# Patient Record
Sex: Female | Born: 1935 | Race: White | Hispanic: No | Smoking: Former smoker
Health system: Southern US, Community
[De-identification: ages and names within clinical notes are randomized; demographics above are authoritative.]

## PROBLEM LIST (undated history)

## (undated) DIAGNOSIS — G459 Transient cerebral ischemic attack, unspecified: Secondary | ICD-10-CM

## (undated) DIAGNOSIS — J449 Chronic obstructive pulmonary disease, unspecified: Secondary | ICD-10-CM

## (undated) DIAGNOSIS — E039 Hypothyroidism, unspecified: Secondary | ICD-10-CM

## (undated) DIAGNOSIS — W19XXXA Unspecified fall, initial encounter: Secondary | ICD-10-CM

## (undated) DIAGNOSIS — K219 Gastro-esophageal reflux disease without esophagitis: Secondary | ICD-10-CM

## (undated) DIAGNOSIS — I10 Essential (primary) hypertension: Secondary | ICD-10-CM

## (undated) DIAGNOSIS — C801 Malignant (primary) neoplasm, unspecified: Secondary | ICD-10-CM

## (undated) DIAGNOSIS — F329 Major depressive disorder, single episode, unspecified: Secondary | ICD-10-CM

## (undated) DIAGNOSIS — I82409 Acute embolism and thrombosis of unspecified deep veins of unspecified lower extremity: Secondary | ICD-10-CM

## (undated) DIAGNOSIS — K432 Incisional hernia without obstruction or gangrene: Secondary | ICD-10-CM

## (undated) DIAGNOSIS — I639 Cerebral infarction, unspecified: Secondary | ICD-10-CM

## (undated) DIAGNOSIS — G4700 Insomnia, unspecified: Secondary | ICD-10-CM

## (undated) DIAGNOSIS — S22009A Unspecified fracture of unspecified thoracic vertebra, initial encounter for closed fracture: Secondary | ICD-10-CM

## (undated) DIAGNOSIS — M546 Pain in thoracic spine: Secondary | ICD-10-CM

## (undated) DIAGNOSIS — K5900 Constipation, unspecified: Secondary | ICD-10-CM

## (undated) DIAGNOSIS — K439 Ventral hernia without obstruction or gangrene: Secondary | ICD-10-CM

## (undated) DIAGNOSIS — R0602 Shortness of breath: Secondary | ICD-10-CM

## (undated) DIAGNOSIS — L920 Granuloma annulare: Secondary | ICD-10-CM

## (undated) DIAGNOSIS — M810 Age-related osteoporosis without current pathological fracture: Secondary | ICD-10-CM

## (undated) DIAGNOSIS — G8194 Hemiplegia, unspecified affecting left nondominant side: Secondary | ICD-10-CM

## (undated) HISTORY — PX: CATARACT EXTRACTION, BILATERAL: SHX1313

## (undated) HISTORY — DX: Unspecified fracture of unspecified thoracic vertebra, initial encounter for closed fracture: S22.009A

## (undated) HISTORY — DX: Insomnia, unspecified: G47.00

## (undated) HISTORY — DX: Unspecified fall, initial encounter: W19.XXXA

## (undated) HISTORY — PX: ABDOMINAL HYSTERECTOMY: SHX81

## (undated) HISTORY — PX: TONSILLECTOMY: SUR1361

## (undated) HISTORY — DX: Pain in thoracic spine: M54.6

## (undated) HISTORY — DX: Hemiplegia, unspecified affecting left nondominant side: G81.94

## (undated) HISTORY — DX: Incisional hernia without obstruction or gangrene: K43.2

## (undated) HISTORY — DX: Constipation, unspecified: K59.00

## (undated) HISTORY — PX: EXPLORATORY LAPAROTOMY W/ BOWEL RESECTION: SHX1544

## (undated) HISTORY — DX: Cerebral infarction, unspecified: I63.9

## (undated) HISTORY — DX: Essential (primary) hypertension: I10

## (undated) HISTORY — DX: Gastro-esophageal reflux disease without esophagitis: K21.9

## (undated) HISTORY — DX: Ventral hernia without obstruction or gangrene: K43.9

## (undated) HISTORY — DX: Granuloma annulare: L92.0

## (undated) HISTORY — DX: Major depressive disorder, single episode, unspecified: F32.9

## (undated) HISTORY — DX: Acute embolism and thrombosis of unspecified deep veins of unspecified lower extremity: I82.409

---

## 1978-03-23 DIAGNOSIS — C801 Malignant (primary) neoplasm, unspecified: Secondary | ICD-10-CM

## 1978-03-23 HISTORY — PX: MASTECTOMY: SHX3

## 1978-03-23 HISTORY — DX: Malignant (primary) neoplasm, unspecified: C80.1

## 1997-11-20 ENCOUNTER — Other Ambulatory Visit: Admission: RE | Admit: 1997-11-20 | Discharge: 1997-11-20 | Payer: Self-pay | Admitting: Internal Medicine

## 1999-01-21 ENCOUNTER — Ambulatory Visit (HOSPITAL_COMMUNITY): Admission: RE | Admit: 1999-01-21 | Discharge: 1999-01-21 | Payer: Self-pay | Admitting: *Deleted

## 2002-01-11 ENCOUNTER — Ambulatory Visit (HOSPITAL_COMMUNITY): Admission: RE | Admit: 2002-01-11 | Discharge: 2002-01-11 | Payer: Self-pay | Admitting: *Deleted

## 2005-04-30 ENCOUNTER — Encounter: Admission: RE | Admit: 2005-04-30 | Discharge: 2005-04-30 | Payer: Self-pay | Admitting: Internal Medicine

## 2006-10-17 ENCOUNTER — Emergency Department (HOSPITAL_COMMUNITY): Admission: EM | Admit: 2006-10-17 | Discharge: 2006-10-18 | Payer: Self-pay | Admitting: Emergency Medicine

## 2008-03-23 HISTORY — PX: TOTAL KNEE ARTHROPLASTY: SHX125

## 2008-04-03 ENCOUNTER — Ambulatory Visit (HOSPITAL_COMMUNITY): Admission: RE | Admit: 2008-04-03 | Discharge: 2008-04-03 | Payer: Self-pay | Admitting: Internal Medicine

## 2008-12-17 ENCOUNTER — Inpatient Hospital Stay (HOSPITAL_COMMUNITY): Admission: RE | Admit: 2008-12-17 | Discharge: 2008-12-28 | Payer: Self-pay | Admitting: Orthopedic Surgery

## 2008-12-29 ENCOUNTER — Inpatient Hospital Stay (HOSPITAL_COMMUNITY): Admission: EM | Admit: 2008-12-29 | Discharge: 2009-01-02 | Payer: Self-pay | Admitting: Emergency Medicine

## 2009-10-15 ENCOUNTER — Encounter: Admission: RE | Admit: 2009-10-15 | Discharge: 2009-10-15 | Payer: Self-pay | Admitting: Internal Medicine

## 2010-01-07 ENCOUNTER — Ambulatory Visit (HOSPITAL_COMMUNITY)
Admission: RE | Admit: 2010-01-07 | Discharge: 2010-01-07 | Payer: Self-pay | Source: Home / Self Care | Admitting: Internal Medicine

## 2010-06-26 LAB — BASIC METABOLIC PANEL
BUN: 5 mg/dL — ABNORMAL LOW (ref 6–23)
BUN: 5 mg/dL — ABNORMAL LOW (ref 6–23)
BUN: 5 mg/dL — ABNORMAL LOW (ref 6–23)
BUN: 6 mg/dL (ref 6–23)
BUN: 7 mg/dL (ref 6–23)
BUN: 9 mg/dL (ref 6–23)
CO2: 25 mEq/L (ref 19–32)
CO2: 25 mEq/L (ref 19–32)
CO2: 27 mEq/L (ref 19–32)
CO2: 27 mEq/L (ref 19–32)
CO2: 29 mEq/L (ref 19–32)
CO2: 29 mEq/L (ref 19–32)
Calcium: 8 mg/dL — ABNORMAL LOW (ref 8.4–10.5)
Calcium: 8.3 mg/dL — ABNORMAL LOW (ref 8.4–10.5)
Calcium: 8.6 mg/dL (ref 8.4–10.5)
Chloride: 100 mEq/L (ref 96–112)
Chloride: 101 mEq/L (ref 96–112)
Chloride: 101 mEq/L (ref 96–112)
Chloride: 102 mEq/L (ref 96–112)
Chloride: 104 mEq/L (ref 96–112)
Chloride: 98 mEq/L (ref 96–112)
Chloride: 99 mEq/L (ref 96–112)
Chloride: 99 mEq/L (ref 96–112)
Creatinine, Ser: 0.47 mg/dL (ref 0.4–1.2)
Creatinine, Ser: 0.52 mg/dL (ref 0.4–1.2)
Creatinine, Ser: 0.63 mg/dL (ref 0.4–1.2)
GFR calc Af Amer: >60 mL/min (ref >60)
GFR calc Af Amer: >60 mL/min (ref >60)
GFR calc Af Amer: >60 mL/min (ref >60)
GFR calc Af Amer: >60 mL/min (ref >60)
GFR calc Af Amer: >60 mL/min (ref >60)
GFR calc non Af Amer: >60 mL/min (ref >60)
GFR calc non Af Amer: >60 mL/min (ref >60)
GFR calc non Af Amer: >60 mL/min (ref >60)
GFR calc non Af Amer: >60 mL/min (ref >60)
GFR calc non Af Amer: >60 mL/min (ref >60)
GFR calc non Af Amer: >60 mL/min (ref >60)
GFR calc non Af Amer: >60 mL/min (ref >60)
Glucose, Bld: 103 mg/dL — ABNORMAL HIGH (ref 70–99)
Glucose, Bld: 106 mg/dL — ABNORMAL HIGH (ref 70–99)
Glucose, Bld: 126 mg/dL — ABNORMAL HIGH (ref 70–99)
Glucose, Bld: 129 mg/dL — ABNORMAL HIGH (ref 70–99)
Glucose, Bld: 140 mg/dL — ABNORMAL HIGH (ref 70–99)
Glucose, Bld: 140 mg/dL — ABNORMAL HIGH (ref 70–99)
Glucose, Bld: 158 mg/dL — ABNORMAL HIGH (ref 70–99)
Glucose, Bld: 92 mg/dL (ref 70–99)
Potassium: 2.8 mEq/L — ABNORMAL LOW (ref 3.5–5.1)
Potassium: 3.1 mEq/L — ABNORMAL LOW (ref 3.5–5.1)
Potassium: 3.7 mEq/L (ref 3.5–5.1)
Potassium: 3.7 mEq/L (ref 3.5–5.1)
Potassium: 3.8 mEq/L (ref 3.5–5.1)
Potassium: 4 mEq/L (ref 3.5–5.1)
Sodium: 130 mEq/L — ABNORMAL LOW (ref 135–145)
Sodium: 132 mEq/L — ABNORMAL LOW (ref 135–145)
Sodium: 133 mEq/L — ABNORMAL LOW (ref 135–145)
Sodium: 133 mEq/L — ABNORMAL LOW (ref 135–145)
Sodium: 134 mEq/L — ABNORMAL LOW (ref 135–145)
Sodium: 136 mEq/L (ref 135–145)
Sodium: 136 mEq/L (ref 135–145)
Sodium: 139 mEq/L (ref 135–145)

## 2010-06-26 LAB — PROTIME-INR
INR: 2.04 — ABNORMAL HIGH (ref 0.00–1.49)
INR: 2.17 — ABNORMAL HIGH (ref 0.00–1.49)
INR: 2.5 — ABNORMAL HIGH (ref 0.00–1.49)
INR: 2.9 — ABNORMAL HIGH (ref 0.00–1.49)
Prothrombin Time: 22.3 seconds — ABNORMAL HIGH (ref 11.6–15.2)
Prothrombin Time: 24 seconds — ABNORMAL HIGH (ref 11.6–15.2)
Prothrombin Time: 24.4 seconds — ABNORMAL HIGH (ref 11.6–15.2)
Prothrombin Time: 27.3 seconds — ABNORMAL HIGH (ref 11.6–15.2)
Prothrombin Time: 29.8 seconds — ABNORMAL HIGH (ref 11.6–15.2)

## 2010-06-26 LAB — CBC
HCT: 30.1 % — ABNORMAL LOW (ref 36.0–46.0)
HCT: 30.8 % — ABNORMAL LOW (ref 36.0–46.0)
HCT: 30.9 % — ABNORMAL LOW (ref 36.0–46.0)
HCT: 34.3 % — ABNORMAL LOW (ref 36.0–46.0)
Hemoglobin: 10.2 g/dL — ABNORMAL LOW (ref 12.0–15.0)
Hemoglobin: 10.4 g/dL — ABNORMAL LOW (ref 12.0–15.0)
Hemoglobin: 10.4 g/dL — ABNORMAL LOW (ref 12.0–15.0)
Hemoglobin: 10.8 g/dL — ABNORMAL LOW (ref 12.0–15.0)
Hemoglobin: 11.4 g/dL — ABNORMAL LOW (ref 12.0–15.0)
MCHC: 32.6 g/dL (ref 30.0–36.0)
MCHC: 33.4 g/dL (ref 30.0–36.0)
MCHC: 33.7 g/dL (ref 30.0–36.0)
MCHC: 34.7 g/dL (ref 30.0–36.0)
MCV: 92.5 fL (ref 78.0–100.0)
MCV: 92.7 fL (ref 78.0–100.0)
MCV: 93.3 fL (ref 78.0–100.0)
MCV: 93.4 fL (ref 78.0–100.0)
MCV: 93.4 fL (ref 78.0–100.0)
Platelets: 409 10*3/uL — ABNORMAL HIGH (ref 150–400)
Platelets: 606 10*3/uL — ABNORMAL HIGH (ref 150–400)
RBC: 3.23 MIL/uL — ABNORMAL LOW (ref 3.87–5.11)
RBC: 3.28 MIL/uL — ABNORMAL LOW (ref 3.87–5.11)
RBC: 3.3 MIL/uL — ABNORMAL LOW (ref 3.87–5.11)
RBC: 3.67 MIL/uL — ABNORMAL LOW (ref 3.87–5.11)
RDW: 14.6 % (ref 11.5–15.5)
RDW: 14.8 % (ref 11.5–15.5)
RDW: 15 % (ref 11.5–15.5)
RDW: 15.5 % (ref 11.5–15.5)
WBC: 5.6 10*3/uL (ref 4.0–10.5)
WBC: 7.5 10*3/uL (ref 4.0–10.5)
WBC: 7.8 10*3/uL (ref 4.0–10.5)

## 2010-06-26 LAB — POCT I-STAT, CHEM 8
BUN: 10 mg/dL (ref 6–23)
Calcium, Ion: 1.12 mmol/L (ref 1.12–1.32)
Chloride: 97 mEq/L (ref 96–112)
Creatinine, Ser: 0.6 mg/dL (ref 0.4–1.2)
Sodium: 134 mEq/L — ABNORMAL LOW (ref 135–145)
TCO2: 26 mmol/L (ref 0–100)

## 2010-06-26 LAB — CARDIAC PANEL(CRET KIN+CKTOT+MB+TROPI)
CK, MB: 2.7 ng/mL (ref 0.3–4.0)
Total CK: 44 U/L (ref 7–177)
Troponin I: 0.03 ng/mL (ref 0.00–0.06)
Troponin I: 0.03 ng/mL (ref 0.00–0.06)

## 2010-06-26 LAB — MYOGLOBIN, SERUM: Myoglobin: 83 ng/mL (ref <111)

## 2010-06-27 LAB — URINALYSIS, ROUTINE W REFLEX MICROSCOPIC
Bilirubin Urine: NEGATIVE
Glucose, UA: NEGATIVE mg/dL
Hgb urine dipstick: NEGATIVE
Ketones, ur: NEGATIVE mg/dL
Nitrite: NEGATIVE
Protein, ur: NEGATIVE mg/dL
Specific Gravity, Urine: 1.02 (ref 1.005–1.030)
Urobilinogen, UA: 0.2 mg/dL (ref 0.0–1.0)
pH: 7 (ref 5.0–8.0)

## 2010-06-27 LAB — CBC
HCT: 30.6 % — ABNORMAL LOW (ref 36.0–46.0)
HCT: 32.2 % — ABNORMAL LOW (ref 36.0–46.0)
HCT: 37.6 % (ref 36.0–46.0)
Hemoglobin: 10.5 g/dL — ABNORMAL LOW (ref 12.0–15.0)
Hemoglobin: 11.1 g/dL — ABNORMAL LOW (ref 12.0–15.0)
Hemoglobin: 12.9 g/dL (ref 12.0–15.0)
MCHC: 34.2 g/dL (ref 30.0–36.0)
MCHC: 34.3 g/dL (ref 30.0–36.0)
MCHC: 34.3 g/dL (ref 30.0–36.0)
MCHC: 34.5 g/dL (ref 30.0–36.0)
MCV: 93.4 fL (ref 78.0–100.0)
MCV: 94.3 fL (ref 78.0–100.0)
MCV: 94.4 fL (ref 78.0–100.0)
MCV: 94.5 fL (ref 78.0–100.0)
Platelets: 350 10*3/uL (ref 150–400)
Platelets: 367 10*3/uL (ref 150–400)
Platelets: 372 10*3/uL (ref 150–400)
Platelets: 417 10*3/uL — ABNORMAL HIGH (ref 150–400)
RBC: 3.24 MIL/uL — ABNORMAL LOW (ref 3.87–5.11)
RBC: 3.42 MIL/uL — ABNORMAL LOW (ref 3.87–5.11)
RBC: 3.48 MIL/uL — ABNORMAL LOW (ref 3.87–5.11)
RBC: 4.03 MIL/uL (ref 3.87–5.11)
RDW: 14.1 % (ref 11.5–15.5)
RDW: 14.6 % (ref 11.5–15.5)
RDW: 14.8 % (ref 11.5–15.5)
RDW: 15 % (ref 11.5–15.5)
WBC: 6 10*3/uL (ref 4.0–10.5)
WBC: 8.3 10*3/uL (ref 4.0–10.5)
WBC: 8.6 10*3/uL (ref 4.0–10.5)

## 2010-06-27 LAB — BASIC METABOLIC PANEL WITH GFR
BUN: 10 mg/dL (ref 6–23)
BUN: 13 mg/dL (ref 6–23)
BUN: 5 mg/dL — ABNORMAL LOW (ref 6–23)
CO2: 27 meq/L (ref 19–32)
CO2: 28 meq/L (ref 19–32)
CO2: 28 meq/L (ref 19–32)
Calcium: 8.1 mg/dL — ABNORMAL LOW (ref 8.4–10.5)
Calcium: 8.3 mg/dL — ABNORMAL LOW (ref 8.4–10.5)
Calcium: 8.5 mg/dL (ref 8.4–10.5)
Chloride: 100 meq/L (ref 96–112)
Chloride: 103 meq/L (ref 96–112)
Chloride: 99 meq/L (ref 96–112)
Creatinine, Ser: 0.58 mg/dL (ref 0.4–1.2)
Creatinine, Ser: 0.62 mg/dL (ref 0.4–1.2)
Creatinine, Ser: 0.73 mg/dL (ref 0.4–1.2)
GFR calc non Af Amer: >60 mL/min
GFR calc non Af Amer: >60 mL/min
GFR calc non Af Amer: >60 mL/min
Glucose, Bld: 146 mg/dL — ABNORMAL HIGH (ref 70–99)
Glucose, Bld: 150 mg/dL — ABNORMAL HIGH (ref 70–99)
Glucose, Bld: 153 mg/dL — ABNORMAL HIGH (ref 70–99)
Potassium: 3.2 meq/L — ABNORMAL LOW (ref 3.5–5.1)
Potassium: 3.7 meq/L (ref 3.5–5.1)
Potassium: 3.7 meq/L (ref 3.5–5.1)
Sodium: 132 meq/L — ABNORMAL LOW (ref 135–145)
Sodium: 133 meq/L — ABNORMAL LOW (ref 135–145)
Sodium: 136 meq/L (ref 135–145)

## 2010-06-27 LAB — TYPE AND SCREEN
ABO/RH(D): O POS
Antibody Screen: NEGATIVE

## 2010-06-27 LAB — COMPREHENSIVE METABOLIC PANEL WITH GFR
ALT: 14 U/L (ref 0–35)
AST: 17 U/L (ref 0–37)
Albumin: 3.7 g/dL (ref 3.5–5.2)
Alkaline Phosphatase: 49 U/L (ref 39–117)
BUN: 13 mg/dL (ref 6–23)
CO2: 29 meq/L (ref 19–32)
Calcium: 8.6 mg/dL (ref 8.4–10.5)
Chloride: 105 meq/L (ref 96–112)
Creatinine, Ser: 0.69 mg/dL (ref 0.4–1.2)
GFR calc non Af Amer: >60 mL/min
Glucose, Bld: 93 mg/dL (ref 70–99)
Potassium: 4.8 meq/L (ref 3.5–5.1)
Sodium: 138 meq/L (ref 135–145)
Total Bilirubin: 0.5 mg/dL (ref 0.3–1.2)
Total Protein: 6.2 g/dL (ref 6.0–8.3)

## 2010-06-27 LAB — MAGNESIUM: Magnesium: 2 mg/dL (ref 1.5–2.5)

## 2010-06-27 LAB — PROTIME-INR
INR: 1 (ref 0.00–1.49)
INR: 2.3 — ABNORMAL HIGH (ref 0.00–1.49)
INR: 4.1 — ABNORMAL HIGH (ref 0.00–1.49)
Prothrombin Time: 12.8 s (ref 11.6–15.2)
Prothrombin Time: 14.1 seconds (ref 11.6–15.2)
Prothrombin Time: 25.4 seconds — ABNORMAL HIGH (ref 11.6–15.2)
Prothrombin Time: 39.7 s — ABNORMAL HIGH (ref 11.6–15.2)

## 2010-06-27 LAB — PHOSPHORUS: Phosphorus: 1.8 mg/dL — ABNORMAL LOW (ref 2.3–4.6)

## 2010-06-27 LAB — APTT

## 2011-04-09 DIAGNOSIS — E039 Hypothyroidism, unspecified: Secondary | ICD-10-CM | POA: Diagnosis not present

## 2011-04-09 DIAGNOSIS — I10 Essential (primary) hypertension: Secondary | ICD-10-CM | POA: Diagnosis not present

## 2011-04-09 DIAGNOSIS — E785 Hyperlipidemia, unspecified: Secondary | ICD-10-CM | POA: Diagnosis not present

## 2011-04-09 DIAGNOSIS — E559 Vitamin D deficiency, unspecified: Secondary | ICD-10-CM | POA: Diagnosis not present

## 2011-04-16 DIAGNOSIS — Z0000 Encounter for general adult medical examination without abnormal findings: Secondary | ICD-10-CM | POA: Diagnosis not present

## 2011-04-16 DIAGNOSIS — I10 Essential (primary) hypertension: Secondary | ICD-10-CM | POA: Diagnosis not present

## 2011-04-16 DIAGNOSIS — E669 Obesity, unspecified: Secondary | ICD-10-CM | POA: Diagnosis not present

## 2011-04-16 DIAGNOSIS — K219 Gastro-esophageal reflux disease without esophagitis: Secondary | ICD-10-CM | POA: Diagnosis not present

## 2011-04-17 DIAGNOSIS — Z1212 Encounter for screening for malignant neoplasm of rectum: Secondary | ICD-10-CM | POA: Diagnosis not present

## 2011-06-08 DIAGNOSIS — I639 Cerebral infarction, unspecified: Secondary | ICD-10-CM

## 2011-06-08 HISTORY — DX: Cerebral infarction, unspecified: I63.9

## 2011-09-14 DIAGNOSIS — L259 Unspecified contact dermatitis, unspecified cause: Secondary | ICD-10-CM | POA: Diagnosis not present

## 2011-09-14 DIAGNOSIS — B029 Zoster without complications: Secondary | ICD-10-CM | POA: Diagnosis not present

## 2011-09-14 DIAGNOSIS — I10 Essential (primary) hypertension: Secondary | ICD-10-CM | POA: Diagnosis not present

## 2011-10-27 DIAGNOSIS — I10 Essential (primary) hypertension: Secondary | ICD-10-CM | POA: Diagnosis not present

## 2011-10-27 DIAGNOSIS — R7309 Other abnormal glucose: Secondary | ICD-10-CM | POA: Diagnosis not present

## 2011-10-27 DIAGNOSIS — E039 Hypothyroidism, unspecified: Secondary | ICD-10-CM | POA: Diagnosis not present

## 2011-10-27 DIAGNOSIS — R209 Unspecified disturbances of skin sensation: Secondary | ICD-10-CM | POA: Diagnosis not present

## 2011-11-10 DIAGNOSIS — M810 Age-related osteoporosis without current pathological fracture: Secondary | ICD-10-CM | POA: Diagnosis not present

## 2011-12-09 DIAGNOSIS — H2640 Unspecified secondary cataract: Secondary | ICD-10-CM | POA: Diagnosis not present

## 2011-12-09 DIAGNOSIS — H1030 Unspecified acute conjunctivitis, unspecified eye: Secondary | ICD-10-CM | POA: Diagnosis not present

## 2011-12-09 DIAGNOSIS — Z961 Presence of intraocular lens: Secondary | ICD-10-CM | POA: Diagnosis not present

## 2012-01-06 DIAGNOSIS — Z79899 Other long term (current) drug therapy: Secondary | ICD-10-CM | POA: Diagnosis not present

## 2012-01-06 DIAGNOSIS — M810 Age-related osteoporosis without current pathological fracture: Secondary | ICD-10-CM | POA: Diagnosis not present

## 2012-01-06 DIAGNOSIS — E559 Vitamin D deficiency, unspecified: Secondary | ICD-10-CM | POA: Diagnosis not present

## 2012-01-06 DIAGNOSIS — K219 Gastro-esophageal reflux disease without esophagitis: Secondary | ICD-10-CM | POA: Diagnosis not present

## 2012-01-15 DIAGNOSIS — Z23 Encounter for immunization: Secondary | ICD-10-CM | POA: Diagnosis not present

## 2012-01-25 ENCOUNTER — Other Ambulatory Visit (HOSPITAL_COMMUNITY): Payer: Self-pay | Admitting: *Deleted

## 2012-01-27 ENCOUNTER — Encounter (HOSPITAL_COMMUNITY): Payer: Self-pay

## 2012-01-27 ENCOUNTER — Ambulatory Visit (HOSPITAL_COMMUNITY)
Admission: RE | Admit: 2012-01-27 | Discharge: 2012-01-27 | Disposition: A | Discharge status: Home / Self Care | Payer: Medicare Other | Source: Ambulatory Visit | Attending: Internal Medicine | Admitting: Internal Medicine

## 2012-01-27 DIAGNOSIS — M810 Age-related osteoporosis without current pathological fracture: Secondary | ICD-10-CM | POA: Insufficient documentation

## 2012-01-27 HISTORY — DX: Chronic obstructive pulmonary disease, unspecified: J44.9

## 2012-01-27 HISTORY — DX: Essential (primary) hypertension: I10

## 2012-01-27 HISTORY — DX: Shortness of breath: R06.02

## 2012-01-27 HISTORY — DX: Hypothyroidism, unspecified: E03.9

## 2012-01-27 HISTORY — DX: Age-related osteoporosis without current pathological fracture: M81.0

## 2012-01-27 MED ORDER — ZOLEDRONIC ACID 5 MG/100ML IV SOLN
5.0000 mg | Freq: Once | INTRAVENOUS | Status: AC
  Administered 2012-01-27: 5 mg via INTRAVENOUS
  Filled 2012-01-27: qty 100

## 2012-01-27 MED ORDER — SODIUM CHLORIDE 0.9 % IV SOLN
Freq: Once | INTRAVENOUS | Status: AC
  Administered 2012-01-27: 11:00:00 via INTRAVENOUS

## 2012-02-22 DIAGNOSIS — G43909 Migraine, unspecified, not intractable, without status migrainosus: Secondary | ICD-10-CM | POA: Diagnosis not present

## 2012-03-01 DIAGNOSIS — G43909 Migraine, unspecified, not intractable, without status migrainosus: Secondary | ICD-10-CM | POA: Diagnosis not present

## 2012-03-31 DIAGNOSIS — H5310 Unspecified subjective visual disturbances: Secondary | ICD-10-CM | POA: Diagnosis not present

## 2012-03-31 DIAGNOSIS — H2640 Unspecified secondary cataract: Secondary | ICD-10-CM | POA: Diagnosis not present

## 2012-03-31 DIAGNOSIS — H04129 Dry eye syndrome of unspecified lacrimal gland: Secondary | ICD-10-CM | POA: Diagnosis not present

## 2012-03-31 DIAGNOSIS — H43819 Vitreous degeneration, unspecified eye: Secondary | ICD-10-CM | POA: Diagnosis not present

## 2012-04-26 DIAGNOSIS — I10 Essential (primary) hypertension: Secondary | ICD-10-CM | POA: Diagnosis not present

## 2012-04-26 DIAGNOSIS — E559 Vitamin D deficiency, unspecified: Secondary | ICD-10-CM | POA: Diagnosis not present

## 2012-04-26 DIAGNOSIS — E785 Hyperlipidemia, unspecified: Secondary | ICD-10-CM | POA: Diagnosis not present

## 2012-04-26 DIAGNOSIS — E039 Hypothyroidism, unspecified: Secondary | ICD-10-CM | POA: Diagnosis not present

## 2012-04-28 DIAGNOSIS — H26499 Other secondary cataract, unspecified eye: Secondary | ICD-10-CM | POA: Diagnosis not present

## 2012-04-28 DIAGNOSIS — H2640 Unspecified secondary cataract: Secondary | ICD-10-CM | POA: Diagnosis not present

## 2012-05-02 DIAGNOSIS — Z1331 Encounter for screening for depression: Secondary | ICD-10-CM | POA: Diagnosis not present

## 2012-05-02 DIAGNOSIS — K219 Gastro-esophageal reflux disease without esophagitis: Secondary | ICD-10-CM | POA: Diagnosis not present

## 2012-05-02 DIAGNOSIS — Z1212 Encounter for screening for malignant neoplasm of rectum: Secondary | ICD-10-CM | POA: Diagnosis not present

## 2012-05-02 DIAGNOSIS — Z0000 Encounter for general adult medical examination without abnormal findings: Secondary | ICD-10-CM | POA: Diagnosis not present

## 2012-05-02 DIAGNOSIS — H6120 Impacted cerumen, unspecified ear: Secondary | ICD-10-CM | POA: Diagnosis not present

## 2012-05-12 DIAGNOSIS — H2640 Unspecified secondary cataract: Secondary | ICD-10-CM | POA: Diagnosis not present

## 2012-05-12 DIAGNOSIS — H26499 Other secondary cataract, unspecified eye: Secondary | ICD-10-CM | POA: Diagnosis not present

## 2012-10-16 DIAGNOSIS — J189 Pneumonia, unspecified organism: Secondary | ICD-10-CM | POA: Diagnosis not present

## 2012-10-19 DIAGNOSIS — E871 Hypo-osmolality and hyponatremia: Secondary | ICD-10-CM | POA: Diagnosis not present

## 2012-10-19 DIAGNOSIS — E46 Unspecified protein-calorie malnutrition: Secondary | ICD-10-CM | POA: Diagnosis not present

## 2012-10-19 DIAGNOSIS — J449 Chronic obstructive pulmonary disease, unspecified: Secondary | ICD-10-CM | POA: Diagnosis not present

## 2012-10-19 DIAGNOSIS — E869 Volume depletion, unspecified: Secondary | ICD-10-CM | POA: Diagnosis present

## 2012-10-19 DIAGNOSIS — Z87891 Personal history of nicotine dependence: Secondary | ICD-10-CM | POA: Diagnosis not present

## 2012-10-19 DIAGNOSIS — Z9889 Other specified postprocedural states: Secondary | ICD-10-CM | POA: Diagnosis not present

## 2012-10-19 DIAGNOSIS — E669 Obesity, unspecified: Secondary | ICD-10-CM | POA: Diagnosis present

## 2012-10-19 DIAGNOSIS — Z9010 Acquired absence of unspecified breast and nipple: Secondary | ICD-10-CM | POA: Diagnosis not present

## 2012-10-19 DIAGNOSIS — Z66 Do not resuscitate: Secondary | ICD-10-CM | POA: Diagnosis present

## 2012-10-19 DIAGNOSIS — I82619 Acute embolism and thrombosis of superficial veins of unspecified upper extremity: Secondary | ICD-10-CM | POA: Diagnosis present

## 2012-10-19 DIAGNOSIS — E870 Hyperosmolality and hypernatremia: Secondary | ICD-10-CM | POA: Diagnosis not present

## 2012-10-19 DIAGNOSIS — K5989 Other specified functional intestinal disorders: Secondary | ICD-10-CM | POA: Diagnosis not present

## 2012-10-19 DIAGNOSIS — J441 Chronic obstructive pulmonary disease with (acute) exacerbation: Secondary | ICD-10-CM | POA: Diagnosis not present

## 2012-10-19 DIAGNOSIS — R0602 Shortness of breath: Secondary | ICD-10-CM | POA: Diagnosis not present

## 2012-10-19 DIAGNOSIS — Z452 Encounter for adjustment and management of vascular access device: Secondary | ICD-10-CM | POA: Diagnosis not present

## 2012-10-19 DIAGNOSIS — K929 Disease of digestive system, unspecified: Secondary | ICD-10-CM | POA: Diagnosis not present

## 2012-10-19 DIAGNOSIS — K6389 Other specified diseases of intestine: Secondary | ICD-10-CM | POA: Diagnosis not present

## 2012-10-19 DIAGNOSIS — R142 Eructation: Secondary | ICD-10-CM | POA: Diagnosis not present

## 2012-10-19 DIAGNOSIS — J4489 Other specified chronic obstructive pulmonary disease: Secondary | ICD-10-CM | POA: Diagnosis not present

## 2012-10-19 DIAGNOSIS — R279 Unspecified lack of coordination: Secondary | ICD-10-CM | POA: Diagnosis not present

## 2012-10-19 DIAGNOSIS — D7289 Other specified disorders of white blood cells: Secondary | ICD-10-CM | POA: Diagnosis not present

## 2012-10-19 DIAGNOSIS — D72829 Elevated white blood cell count, unspecified: Secondary | ICD-10-CM | POA: Diagnosis not present

## 2012-10-19 DIAGNOSIS — K565 Intestinal adhesions [bands], unspecified as to partial versus complete obstruction: Secondary | ICD-10-CM | POA: Diagnosis not present

## 2012-10-19 DIAGNOSIS — R05 Cough: Secondary | ICD-10-CM | POA: Diagnosis not present

## 2012-10-19 DIAGNOSIS — Z5189 Encounter for other specified aftercare: Secondary | ICD-10-CM | POA: Diagnosis not present

## 2012-10-19 DIAGNOSIS — R269 Unspecified abnormalities of gait and mobility: Secondary | ICD-10-CM | POA: Diagnosis not present

## 2012-10-19 DIAGNOSIS — K560 Paralytic ileus: Secondary | ICD-10-CM | POA: Diagnosis not present

## 2012-10-19 DIAGNOSIS — J189 Pneumonia, unspecified organism: Secondary | ICD-10-CM | POA: Diagnosis not present

## 2012-10-19 DIAGNOSIS — E878 Other disorders of electrolyte and fluid balance, not elsewhere classified: Secondary | ICD-10-CM | POA: Diagnosis present

## 2012-10-19 DIAGNOSIS — J13 Pneumonia due to Streptococcus pneumoniae: Secondary | ICD-10-CM | POA: Diagnosis not present

## 2012-10-19 DIAGNOSIS — Z853 Personal history of malignant neoplasm of breast: Secondary | ICD-10-CM | POA: Diagnosis not present

## 2012-10-19 DIAGNOSIS — R1110 Vomiting, unspecified: Secondary | ICD-10-CM | POA: Diagnosis not present

## 2012-10-19 DIAGNOSIS — N179 Acute kidney failure, unspecified: Secondary | ICD-10-CM | POA: Diagnosis not present

## 2012-10-19 DIAGNOSIS — K389 Disease of appendix, unspecified: Secondary | ICD-10-CM | POA: Diagnosis not present

## 2012-10-19 DIAGNOSIS — D649 Anemia, unspecified: Secondary | ICD-10-CM | POA: Diagnosis not present

## 2012-10-19 DIAGNOSIS — R7309 Other abnormal glucose: Secondary | ICD-10-CM | POA: Diagnosis not present

## 2012-10-19 DIAGNOSIS — I6990 Unspecified sequelae of unspecified cerebrovascular disease: Secondary | ICD-10-CM | POA: Diagnosis not present

## 2012-10-19 DIAGNOSIS — Z8669 Personal history of other diseases of the nervous system and sense organs: Secondary | ICD-10-CM | POA: Diagnosis not present

## 2012-10-19 DIAGNOSIS — Z96659 Presence of unspecified artificial knee joint: Secondary | ICD-10-CM | POA: Diagnosis not present

## 2012-10-19 DIAGNOSIS — M6281 Muscle weakness (generalized): Secondary | ICD-10-CM | POA: Diagnosis not present

## 2012-10-19 DIAGNOSIS — E039 Hypothyroidism, unspecified: Secondary | ICD-10-CM | POA: Diagnosis present

## 2012-10-19 DIAGNOSIS — N736 Female pelvic peritoneal adhesions (postinfective): Secondary | ICD-10-CM | POA: Diagnosis not present

## 2012-10-19 DIAGNOSIS — K5900 Constipation, unspecified: Secondary | ICD-10-CM | POA: Diagnosis present

## 2012-10-19 DIAGNOSIS — K56609 Unspecified intestinal obstruction, unspecified as to partial versus complete obstruction: Secondary | ICD-10-CM | POA: Diagnosis not present

## 2012-10-19 DIAGNOSIS — K660 Peritoneal adhesions (postprocedural) (postinfection): Secondary | ICD-10-CM | POA: Diagnosis not present

## 2012-10-20 DIAGNOSIS — K56609 Unspecified intestinal obstruction, unspecified as to partial versus complete obstruction: Secondary | ICD-10-CM | POA: Diagnosis not present

## 2012-10-21 DIAGNOSIS — K56609 Unspecified intestinal obstruction, unspecified as to partial versus complete obstruction: Secondary | ICD-10-CM | POA: Diagnosis not present

## 2012-10-22 DIAGNOSIS — K56609 Unspecified intestinal obstruction, unspecified as to partial versus complete obstruction: Secondary | ICD-10-CM | POA: Diagnosis not present

## 2012-10-25 DIAGNOSIS — I639 Cerebral infarction, unspecified: Secondary | ICD-10-CM | POA: Insufficient documentation

## 2012-10-27 DIAGNOSIS — K56609 Unspecified intestinal obstruction, unspecified as to partial versus complete obstruction: Secondary | ICD-10-CM | POA: Diagnosis not present

## 2012-10-28 DIAGNOSIS — Z452 Encounter for adjustment and management of vascular access device: Secondary | ICD-10-CM | POA: Diagnosis not present

## 2012-10-29 DIAGNOSIS — K6389 Other specified diseases of intestine: Secondary | ICD-10-CM | POA: Diagnosis not present

## 2012-10-31 DIAGNOSIS — I6990 Unspecified sequelae of unspecified cerebrovascular disease: Secondary | ICD-10-CM | POA: Diagnosis not present

## 2012-10-31 DIAGNOSIS — H5340 Unspecified visual field defects: Secondary | ICD-10-CM | POA: Diagnosis present

## 2012-10-31 DIAGNOSIS — R279 Unspecified lack of coordination: Secondary | ICD-10-CM | POA: Diagnosis not present

## 2012-10-31 DIAGNOSIS — Z9889 Other specified postprocedural states: Secondary | ICD-10-CM | POA: Diagnosis not present

## 2012-10-31 DIAGNOSIS — I10 Essential (primary) hypertension: Secondary | ICD-10-CM | POA: Diagnosis not present

## 2012-10-31 DIAGNOSIS — Z5189 Encounter for other specified aftercare: Secondary | ICD-10-CM | POA: Diagnosis not present

## 2012-10-31 DIAGNOSIS — K560 Paralytic ileus: Secondary | ICD-10-CM | POA: Diagnosis not present

## 2012-10-31 DIAGNOSIS — I63239 Cerebral infarction due to unspecified occlusion or stenosis of unspecified carotid arteries: Secondary | ICD-10-CM | POA: Diagnosis not present

## 2012-10-31 DIAGNOSIS — Z96659 Presence of unspecified artificial knee joint: Secondary | ICD-10-CM | POA: Diagnosis not present

## 2012-10-31 DIAGNOSIS — Z853 Personal history of malignant neoplasm of breast: Secondary | ICD-10-CM | POA: Diagnosis not present

## 2012-10-31 DIAGNOSIS — E46 Unspecified protein-calorie malnutrition: Secondary | ICD-10-CM | POA: Diagnosis not present

## 2012-10-31 DIAGNOSIS — K5900 Constipation, unspecified: Secondary | ICD-10-CM | POA: Diagnosis present

## 2012-10-31 DIAGNOSIS — I6350 Cerebral infarction due to unspecified occlusion or stenosis of unspecified cerebral artery: Secondary | ICD-10-CM | POA: Diagnosis not present

## 2012-10-31 DIAGNOSIS — Z9010 Acquired absence of unspecified breast and nipple: Secondary | ICD-10-CM | POA: Diagnosis not present

## 2012-10-31 DIAGNOSIS — K56609 Unspecified intestinal obstruction, unspecified as to partial versus complete obstruction: Secondary | ICD-10-CM | POA: Diagnosis not present

## 2012-10-31 DIAGNOSIS — E876 Hypokalemia: Secondary | ICD-10-CM | POA: Diagnosis not present

## 2012-10-31 DIAGNOSIS — I6789 Other cerebrovascular disease: Secondary | ICD-10-CM | POA: Diagnosis not present

## 2012-10-31 DIAGNOSIS — Z6830 Body mass index (BMI) 30.0-30.9, adult: Secondary | ICD-10-CM | POA: Diagnosis not present

## 2012-10-31 DIAGNOSIS — J4489 Other specified chronic obstructive pulmonary disease: Secondary | ICD-10-CM | POA: Diagnosis not present

## 2012-10-31 DIAGNOSIS — R269 Unspecified abnormalities of gait and mobility: Secondary | ICD-10-CM | POA: Diagnosis not present

## 2012-10-31 DIAGNOSIS — I6529 Occlusion and stenosis of unspecified carotid artery: Secondary | ICD-10-CM | POA: Diagnosis present

## 2012-10-31 DIAGNOSIS — I6320 Cerebral infarction due to unspecified occlusion or stenosis of unspecified precerebral arteries: Secondary | ICD-10-CM | POA: Diagnosis not present

## 2012-10-31 DIAGNOSIS — M6281 Muscle weakness (generalized): Secondary | ICD-10-CM | POA: Diagnosis not present

## 2012-10-31 DIAGNOSIS — Z86718 Personal history of other venous thrombosis and embolism: Secondary | ICD-10-CM | POA: Diagnosis not present

## 2012-10-31 DIAGNOSIS — D649 Anemia, unspecified: Secondary | ICD-10-CM | POA: Diagnosis not present

## 2012-10-31 DIAGNOSIS — C50919 Malignant neoplasm of unspecified site of unspecified female breast: Secondary | ICD-10-CM | POA: Diagnosis not present

## 2012-10-31 DIAGNOSIS — Z7982 Long term (current) use of aspirin: Secondary | ICD-10-CM | POA: Diagnosis not present

## 2012-10-31 DIAGNOSIS — I080 Rheumatic disorders of both mitral and aortic valves: Secondary | ICD-10-CM | POA: Diagnosis present

## 2012-10-31 DIAGNOSIS — Z87891 Personal history of nicotine dependence: Secondary | ICD-10-CM | POA: Diagnosis not present

## 2012-10-31 DIAGNOSIS — E039 Hypothyroidism, unspecified: Secondary | ICD-10-CM | POA: Diagnosis present

## 2012-10-31 DIAGNOSIS — Z8249 Family history of ischemic heart disease and other diseases of the circulatory system: Secondary | ICD-10-CM | POA: Diagnosis not present

## 2012-10-31 DIAGNOSIS — G459 Transient cerebral ischemic attack, unspecified: Secondary | ICD-10-CM | POA: Diagnosis not present

## 2012-10-31 DIAGNOSIS — I69959 Hemiplegia and hemiparesis following unspecified cerebrovascular disease affecting unspecified side: Secondary | ICD-10-CM | POA: Diagnosis not present

## 2012-10-31 DIAGNOSIS — J449 Chronic obstructive pulmonary disease, unspecified: Secondary | ICD-10-CM | POA: Diagnosis not present

## 2012-10-31 DIAGNOSIS — I517 Cardiomegaly: Secondary | ICD-10-CM | POA: Diagnosis present

## 2012-10-31 DIAGNOSIS — I658 Occlusion and stenosis of other precerebral arteries: Secondary | ICD-10-CM | POA: Diagnosis present

## 2012-10-31 DIAGNOSIS — R5381 Other malaise: Secondary | ICD-10-CM | POA: Diagnosis not present

## 2012-11-01 DIAGNOSIS — J449 Chronic obstructive pulmonary disease, unspecified: Secondary | ICD-10-CM | POA: Diagnosis not present

## 2012-11-01 DIAGNOSIS — C50919 Malignant neoplasm of unspecified site of unspecified female breast: Secondary | ICD-10-CM | POA: Diagnosis not present

## 2012-11-01 DIAGNOSIS — K56609 Unspecified intestinal obstruction, unspecified as to partial versus complete obstruction: Secondary | ICD-10-CM | POA: Diagnosis not present

## 2012-11-01 DIAGNOSIS — I10 Essential (primary) hypertension: Secondary | ICD-10-CM | POA: Diagnosis not present

## 2012-11-04 DIAGNOSIS — I63239 Cerebral infarction due to unspecified occlusion or stenosis of unspecified carotid arteries: Secondary | ICD-10-CM | POA: Diagnosis not present

## 2012-11-04 DIAGNOSIS — I6350 Cerebral infarction due to unspecified occlusion or stenosis of unspecified cerebral artery: Secondary | ICD-10-CM | POA: Diagnosis not present

## 2012-11-04 DIAGNOSIS — I10 Essential (primary) hypertension: Secondary | ICD-10-CM | POA: Diagnosis not present

## 2012-11-04 DIAGNOSIS — R5381 Other malaise: Secondary | ICD-10-CM | POA: Diagnosis not present

## 2012-11-04 DIAGNOSIS — I6789 Other cerebrovascular disease: Secondary | ICD-10-CM | POA: Diagnosis not present

## 2012-11-04 DIAGNOSIS — D649 Anemia, unspecified: Secondary | ICD-10-CM | POA: Diagnosis not present

## 2012-11-04 DIAGNOSIS — I059 Rheumatic mitral valve disease, unspecified: Secondary | ICD-10-CM | POA: Diagnosis not present

## 2012-11-04 DIAGNOSIS — R279 Unspecified lack of coordination: Secondary | ICD-10-CM | POA: Diagnosis not present

## 2012-11-04 DIAGNOSIS — J449 Chronic obstructive pulmonary disease, unspecified: Secondary | ICD-10-CM | POA: Diagnosis not present

## 2012-11-04 DIAGNOSIS — R269 Unspecified abnormalities of gait and mobility: Secondary | ICD-10-CM | POA: Diagnosis not present

## 2012-11-04 DIAGNOSIS — I69959 Hemiplegia and hemiparesis following unspecified cerebrovascular disease affecting unspecified side: Secondary | ICD-10-CM | POA: Diagnosis not present

## 2012-11-04 DIAGNOSIS — I517 Cardiomegaly: Secondary | ICD-10-CM | POA: Diagnosis present

## 2012-11-04 DIAGNOSIS — G459 Transient cerebral ischemic attack, unspecified: Secondary | ICD-10-CM | POA: Diagnosis not present

## 2012-11-04 DIAGNOSIS — Z8249 Family history of ischemic heart disease and other diseases of the circulatory system: Secondary | ICD-10-CM | POA: Diagnosis not present

## 2012-11-04 DIAGNOSIS — Z7982 Long term (current) use of aspirin: Secondary | ICD-10-CM | POA: Diagnosis not present

## 2012-11-04 DIAGNOSIS — I6320 Cerebral infarction due to unspecified occlusion or stenosis of unspecified precerebral arteries: Secondary | ICD-10-CM | POA: Diagnosis not present

## 2012-11-04 DIAGNOSIS — E46 Unspecified protein-calorie malnutrition: Secondary | ICD-10-CM | POA: Diagnosis present

## 2012-11-04 DIAGNOSIS — H5340 Unspecified visual field defects: Secondary | ICD-10-CM | POA: Diagnosis not present

## 2012-11-04 DIAGNOSIS — Z96659 Presence of unspecified artificial knee joint: Secondary | ICD-10-CM | POA: Diagnosis not present

## 2012-11-04 DIAGNOSIS — Z86718 Personal history of other venous thrombosis and embolism: Secondary | ICD-10-CM | POA: Diagnosis not present

## 2012-11-04 DIAGNOSIS — Z87891 Personal history of nicotine dependence: Secondary | ICD-10-CM | POA: Diagnosis not present

## 2012-11-04 DIAGNOSIS — E039 Hypothyroidism, unspecified: Secondary | ICD-10-CM | POA: Diagnosis present

## 2012-11-04 DIAGNOSIS — I369 Nonrheumatic tricuspid valve disorder, unspecified: Secondary | ICD-10-CM | POA: Diagnosis not present

## 2012-11-04 DIAGNOSIS — Z9010 Acquired absence of unspecified breast and nipple: Secondary | ICD-10-CM | POA: Diagnosis not present

## 2012-11-04 DIAGNOSIS — J4489 Other specified chronic obstructive pulmonary disease: Secondary | ICD-10-CM | POA: Diagnosis not present

## 2012-11-04 DIAGNOSIS — R5383 Other fatigue: Secondary | ICD-10-CM | POA: Diagnosis not present

## 2012-11-04 DIAGNOSIS — Z853 Personal history of malignant neoplasm of breast: Secondary | ICD-10-CM | POA: Diagnosis not present

## 2012-11-04 DIAGNOSIS — I6990 Unspecified sequelae of unspecified cerebrovascular disease: Secondary | ICD-10-CM | POA: Diagnosis not present

## 2012-11-04 DIAGNOSIS — Z6830 Body mass index (BMI) 30.0-30.9, adult: Secondary | ICD-10-CM | POA: Diagnosis not present

## 2012-11-04 DIAGNOSIS — E876 Hypokalemia: Secondary | ICD-10-CM | POA: Diagnosis not present

## 2012-11-04 DIAGNOSIS — M6281 Muscle weakness (generalized): Secondary | ICD-10-CM | POA: Diagnosis not present

## 2012-11-04 DIAGNOSIS — I658 Occlusion and stenosis of other precerebral arteries: Secondary | ICD-10-CM | POA: Diagnosis present

## 2012-11-04 DIAGNOSIS — I359 Nonrheumatic aortic valve disorder, unspecified: Secondary | ICD-10-CM | POA: Diagnosis not present

## 2012-11-04 DIAGNOSIS — Z5189 Encounter for other specified aftercare: Secondary | ICD-10-CM | POA: Diagnosis not present

## 2012-11-04 DIAGNOSIS — I6529 Occlusion and stenosis of unspecified carotid artery: Secondary | ICD-10-CM | POA: Diagnosis not present

## 2012-11-04 DIAGNOSIS — I080 Rheumatic disorders of both mitral and aortic valves: Secondary | ICD-10-CM | POA: Diagnosis present

## 2012-11-04 DIAGNOSIS — K5900 Constipation, unspecified: Secondary | ICD-10-CM | POA: Diagnosis present

## 2012-11-04 DIAGNOSIS — R1312 Dysphagia, oropharyngeal phase: Secondary | ICD-10-CM | POA: Diagnosis not present

## 2012-11-08 DIAGNOSIS — I10 Essential (primary) hypertension: Secondary | ICD-10-CM | POA: Diagnosis not present

## 2012-11-08 DIAGNOSIS — R269 Unspecified abnormalities of gait and mobility: Secondary | ICD-10-CM | POA: Diagnosis not present

## 2012-11-08 DIAGNOSIS — D649 Anemia, unspecified: Secondary | ICD-10-CM | POA: Diagnosis not present

## 2012-11-08 DIAGNOSIS — I63239 Cerebral infarction due to unspecified occlusion or stenosis of unspecified carotid arteries: Secondary | ICD-10-CM | POA: Diagnosis not present

## 2012-11-08 DIAGNOSIS — E039 Hypothyroidism, unspecified: Secondary | ICD-10-CM | POA: Diagnosis not present

## 2012-11-08 DIAGNOSIS — I6350 Cerebral infarction due to unspecified occlusion or stenosis of unspecified cerebral artery: Secondary | ICD-10-CM | POA: Diagnosis not present

## 2012-11-08 DIAGNOSIS — J449 Chronic obstructive pulmonary disease, unspecified: Secondary | ICD-10-CM | POA: Diagnosis not present

## 2012-11-08 DIAGNOSIS — I69959 Hemiplegia and hemiparesis following unspecified cerebrovascular disease affecting unspecified side: Secondary | ICD-10-CM | POA: Diagnosis not present

## 2012-11-08 DIAGNOSIS — K56609 Unspecified intestinal obstruction, unspecified as to partial versus complete obstruction: Secondary | ICD-10-CM | POA: Diagnosis not present

## 2012-11-08 DIAGNOSIS — I6990 Unspecified sequelae of unspecified cerebrovascular disease: Secondary | ICD-10-CM | POA: Diagnosis not present

## 2012-11-08 DIAGNOSIS — L0291 Cutaneous abscess, unspecified: Secondary | ICD-10-CM | POA: Diagnosis not present

## 2012-11-08 DIAGNOSIS — R1312 Dysphagia, oropharyngeal phase: Secondary | ICD-10-CM | POA: Diagnosis not present

## 2012-11-08 DIAGNOSIS — R279 Unspecified lack of coordination: Secondary | ICD-10-CM | POA: Diagnosis not present

## 2012-11-08 DIAGNOSIS — Z5189 Encounter for other specified aftercare: Secondary | ICD-10-CM | POA: Diagnosis not present

## 2012-11-09 DIAGNOSIS — I69959 Hemiplegia and hemiparesis following unspecified cerebrovascular disease affecting unspecified side: Secondary | ICD-10-CM | POA: Diagnosis not present

## 2012-11-09 DIAGNOSIS — E039 Hypothyroidism, unspecified: Secondary | ICD-10-CM | POA: Diagnosis not present

## 2012-11-09 DIAGNOSIS — J449 Chronic obstructive pulmonary disease, unspecified: Secondary | ICD-10-CM | POA: Diagnosis not present

## 2012-11-09 DIAGNOSIS — I10 Essential (primary) hypertension: Secondary | ICD-10-CM | POA: Diagnosis not present

## 2012-11-11 DIAGNOSIS — L0291 Cutaneous abscess, unspecified: Secondary | ICD-10-CM | POA: Diagnosis not present

## 2012-11-15 DIAGNOSIS — E039 Hypothyroidism, unspecified: Secondary | ICD-10-CM | POA: Diagnosis not present

## 2012-11-15 DIAGNOSIS — J449 Chronic obstructive pulmonary disease, unspecified: Secondary | ICD-10-CM | POA: Diagnosis not present

## 2012-11-15 DIAGNOSIS — D649 Anemia, unspecified: Secondary | ICD-10-CM | POA: Diagnosis not present

## 2012-11-15 DIAGNOSIS — K56609 Unspecified intestinal obstruction, unspecified as to partial versus complete obstruction: Secondary | ICD-10-CM | POA: Diagnosis not present

## 2012-11-18 ENCOUNTER — Non-Acute Institutional Stay (SKILLED_NURSING_FACILITY): Payer: Medicare Other | Admitting: Nurse Practitioner

## 2012-11-18 ENCOUNTER — Encounter: Payer: Self-pay | Admitting: Nurse Practitioner

## 2012-11-18 DIAGNOSIS — J441 Chronic obstructive pulmonary disease with (acute) exacerbation: Secondary | ICD-10-CM

## 2012-11-18 DIAGNOSIS — Z853 Personal history of malignant neoplasm of breast: Secondary | ICD-10-CM | POA: Insufficient documentation

## 2012-11-18 DIAGNOSIS — K5900 Constipation, unspecified: Secondary | ICD-10-CM

## 2012-11-18 DIAGNOSIS — G459 Transient cerebral ischemic attack, unspecified: Secondary | ICD-10-CM

## 2012-11-18 DIAGNOSIS — I82409 Acute embolism and thrombosis of unspecified deep veins of unspecified lower extremity: Secondary | ICD-10-CM | POA: Insufficient documentation

## 2012-11-18 DIAGNOSIS — I10 Essential (primary) hypertension: Secondary | ICD-10-CM

## 2012-11-18 DIAGNOSIS — K5909 Other constipation: Secondary | ICD-10-CM | POA: Insufficient documentation

## 2012-11-18 DIAGNOSIS — G4700 Insomnia, unspecified: Secondary | ICD-10-CM | POA: Insufficient documentation

## 2012-11-18 DIAGNOSIS — J449 Chronic obstructive pulmonary disease, unspecified: Secondary | ICD-10-CM | POA: Insufficient documentation

## 2012-11-18 DIAGNOSIS — E039 Hypothyroidism, unspecified: Secondary | ICD-10-CM | POA: Insufficient documentation

## 2012-11-18 DIAGNOSIS — I509 Heart failure, unspecified: Secondary | ICD-10-CM | POA: Insufficient documentation

## 2012-11-18 DIAGNOSIS — I82401 Acute embolism and thrombosis of unspecified deep veins of right lower extremity: Secondary | ICD-10-CM

## 2012-11-18 HISTORY — DX: Acute embolism and thrombosis of unspecified deep veins of unspecified lower extremity: I82.409

## 2012-11-18 HISTORY — DX: Insomnia, unspecified: G47.00

## 2012-11-18 HISTORY — DX: Constipation, unspecified: K59.00

## 2012-11-18 HISTORY — DX: Essential (primary) hypertension: I10

## 2012-11-18 NOTE — Assessment & Plan Note (Signed)
S/p remote left modified radical mastectomy with apparent cure.

## 2012-11-18 NOTE — Assessment & Plan Note (Signed)
Presented to ED with resolved left sided weakness and facial droop--MRI brain and CT head unremarkable. Takes ASA.

## 2012-11-18 NOTE — Assessment & Plan Note (Signed)
Right upper extremity DVT-takes ASA 325mg  daily

## 2012-11-18 NOTE — Assessment & Plan Note (Addendum)
Controlled on Coreg 40mg , Cardizem 120mg , and Losartan/HCT 100/25mg , Lisinopril 40mg --update CMP and lipid panel.

## 2012-11-18 NOTE — Assessment & Plan Note (Addendum)
Stable on Spiriva. 

## 2012-11-18 NOTE — Assessment & Plan Note (Signed)
Controlled on Colace 100mg daily, MiraLax daily and prn.   

## 2012-11-18 NOTE — Assessment & Plan Note (Signed)
Takes Levothyroxine daily, update TSH and CBC

## 2012-11-18 NOTE — Progress Notes (Signed)
Patient ID: Dana George, female   DOB: Apr 25, 1923, 79 y.o.   MRN: [id number] Code Status: DNR  No Known Allergies  Chief Complaint  Patient presents with  . Medical Managment of Chronic Issues    HPI: Patient is a 79 y.o. female seen in the SNF at [facility] today for evaluation of her chronic medical conditions. The patient was treated for  PNA in July when she was  in [place] visiting her Dtr. Then later she had complicated hospital stay from 10/19/12-11/01/12 at [facility] with a small bowel obstruction for which she underwent 10/22/12 exploratory laparotomy with extensive lysis of adhesions and segmental small bowel resection with end to end anastomosis per Dr. Army Melia on 10/22/12. Her prolonged postoperative ileus which eventually resolved. 8/151/4 the patient was found to have left sided weakness while in therapy which was resolved w/o intervention. Head CT and brain MRI done while in hospital were unremarkable. The patient was transferred to [facility] 11/17/12 where she has resided for over 11 years independently. No focal neurological deficits noted today except she is still weak in general.  Problem List Items Addressed This Visit   COPD exacerbation     Stable on Spiriva.     DVT (deep venous thrombosis)     Right upper extremity DVT-takes ASA 325mg  daily    HTN (hypertension)     Controlled on Coreg 40mg , Cardizem 120mg , and Losartan/HCT 100/25mg , Lisinopril 40mg --update CMP and lipid panel.     Hx of breast cancer     S/p remote left modified radical mastectomy with apparent cure.     Insomnia     Prn Ambien is needed.     TIA (transient ischemic attack) - Primary     Presented to ED with resolved left sided weakness and facial droop--MRI brain and CT head unremarkable. Takes ASA.     Unspecified constipation     Controlled on Colace 100mg  daily, MiraLax daily and prn.     Unspecified hypothyroidism     Takes Levothyroxine daily, update TSH and CBC        Review of Systems:  Review of Systems  Constitutional: Negative for fever, chills, weight loss and malaise/fatigue.       Generalized weakness, w/c for mobility.   HENT: Negative for hearing loss, ear pain, nosebleeds, congestion, sore throat, neck pain, tinnitus and ear discharge.   Eyes: Negative for blurred vision, double vision, photophobia, pain, discharge and redness.  Respiratory: Negative for cough, hemoptysis, sputum production, shortness of breath, wheezing and stridor.   Cardiovascular: Negative for chest pain, palpitations, orthopnea, claudication, leg swelling and PND.  Gastrointestinal: Negative for heartburn, nausea, vomiting, abdominal pain, diarrhea, constipation, blood in stool and melena.       Right lower quadrant soreness on and off since her abd surgery. No aggravating or relieving factors. Regular BP interval and consistency. Not interfering her sleep at night.  Genitourinary: Positive for frequency. Negative for dysuria, urgency, hematuria and flank pain.       Adult dependents.   Musculoskeletal: Negative for myalgias, back pain, joint pain and falls.  Skin: Negative for itching and rash.       Left mastectomy scar. Left total knee replacement surgery scar. Mid abd surgical incision healed except the lower end small open area.   Neurological: Positive for weakness. Negative for dizziness, tingling, tremors, sensory change, speech change, focal weakness, seizures, loss of consciousness and headaches.  Endo/Heme/Allergies: Negative for environmental allergies and polydipsia. Bruises/bleeds easily.  Psychiatric/Behavioral: Negative for depression, suicidal ideas, hallucinations, memory loss and substance abuse. The patient has insomnia. The patient is not nervous/anxious.      Past Medical History  Diagnosis Date  . Hypothyroidism   . Hypertension   . Shortness of breath   . COPD (chronic obstructive pulmonary disease)   . Osteoporosis    Past Surgical History   Procedure Laterality Date  . Abdominal hysterectomy    . Total knee arthroplasty  2010    left knee  . Mastectomy  1980    left side  . Tonsillectomy    . Exploratory laparotomy w/ bowel resection      10/19/12-11/01/12 [facility] for exploratory laparotomty with extensive lysis of adhesion and segmental small bowel resection with end to end anastomosis per Dr. Army Melia 10/22/12   Social History:   reports that she quit smoking about 23 years ago. She does not have any smokeless tobacco history on file. She reports that she drinks about 0.6 ounces of alcohol per week. She reports that she does not use illicit drugs.  No family history on file.  Medications: Patient's Medications  New Prescriptions   No medications on file  Previous Medications   ASPIRIN 81 MG CHEWABLE TABLET    Chew 81 mg by mouth daily.   CALCIUM CARBONATE-VITAMIN D (CALTRATE 600+D PO)    Take by mouth.   CARVEDILOL (COREG CR) 40 MG 24 HR CAPSULE    Take 40 mg by mouth daily.   CHOLECALCIFEROL (VITAMIN D) 1000 UNITS TABLET    Take 1,000 Units by mouth daily.   DILTIAZEM (CARDIZEM) 120 MG TABLET    Take 120 mg by mouth 4 (four) times daily.   FLUTICASONE (FLONASE) 50 MCG/ACT NASAL SPRAY    Place 2 sprays into the nose daily.   IBUPROFEN (ADVIL,MOTRIN) 600 MG TABLET    Take 600 mg by mouth every 12 (twelve) hours. As needed   LEVOTHYROXINE (SYNTHROID, LEVOTHROID) 50 MCG TABLET    Take 50 mcg by mouth daily.   LOSARTAN-HYDROCHLOROTHIAZIDE (HYZAAR) 100-12.5 MG PER TABLET    Take 1 tablet by mouth daily.   MULTIPLE VITAMIN (MULTIVITAMIN) TABLET    Take 1 tablet by mouth daily.   OMEPRAZOLE (PRILOSEC) 20 MG CAPSULE    Take 20 mg by mouth daily.   POLYETHYLENE GLYCOL (MIRALAX / GLYCOLAX) PACKET    Take 17 g by mouth daily.   QUINAPRIL (ACCUPRIL) 40 MG TABLET    Take 40 mg by mouth at bedtime.   SERTRALINE (ZOLOFT) 100 MG TABLET    Take 100 mg by mouth daily.   TIOTROPIUM (SPIRIVA) 18 MCG INHALATION CAPSULE    Place 18 mcg  into inhaler and inhale daily.   ZOLEDRONIC ACID (RECLAST) 5 MG/100ML SOLN    Inject 5 mg into the vein once.  Modified Medications   No medications on file  Discontinued Medications   No medications on file     Physical Exam: Physical Exam  Constitutional: She is oriented to person, place, and time. She appears well-developed and well-nourished. No distress.  HENT:  Head: Normocephalic and atraumatic.  Right Ear: External ear normal.  Left Ear: External ear normal.  Nose: Nose normal.  Mouth/Throat: Oropharynx is clear and moist. No oropharyngeal exudate.  Eyes: Conjunctivae and EOM are normal. Pupils are equal, round, and reactive to light. Right eye exhibits no discharge. Left eye exhibits no discharge. No scleral icterus.  Neck: Normal range of motion. Neck supple. No JVD present. No tracheal  deviation present. No thyromegaly present.  Cardiovascular: Normal rate, regular rhythm, normal heart sounds and intact distal pulses.   No murmur heard. Pulmonary/Chest: Effort normal and breath sounds normal. No stridor. No respiratory distress. She has no wheezes. She has no rales. She exhibits no tenderness.  Abdominal: Soft. She exhibits no distension and no mass. There is no tenderness. There is no rebound and no guarding.  Musculoskeletal: Normal range of motion. She exhibits no edema and no tenderness.  Lymphadenopathy:    She has no cervical adenopathy.  Neurological: She is alert and oriented to person, place, and time. She has normal reflexes. She displays normal reflexes. No cranial nerve deficit. She exhibits normal muscle tone. Coordination normal.  Skin: Skin is warm and dry. No rash noted. She is not diaphoretic. No erythema. No pallor.  Mid abd surgical scar-healed except a small open area at the lower end of the incision with serous drainage.   Psychiatric: She has a normal mood and affect. Her behavior is normal. Judgment and thought content normal.  Repetitive.     Filed  Vitals:   11/18/12 1343  BP: 144/64  Pulse: 69  Temp: 97.2 F (36.2 C)  TempSrc: Tympanic  Resp: 20    Past Procedures: 11/04/12 MRI of the brain: for resolved left side weakness and facial droop. CT scan of the same date which showed no acute abnormality. Subtle arrea of restricted diffusion in the right periventricular white matter suggesting subacute ischemia. Diffuse atrophy and fairly extensive chronic small vessel ischemic white matter changes.  11/04/12 Carotid Duplex Scan: moderate plaque seen in both bulb regions with elevated velocities right greater than left in the 50-69% range. Antegrade fow in both vertebrales.  11/05/12 Echocardiogram: normal left ventricular chamber size and systolic function with ejection fraction 60%. Normal right ventricular chamber size and systolic function. Structurally normal aortic valve with mild aortic valve with mild aortic insufficient.    Assessment/Plan Hx of breast cancer S/p remote left modified radical mastectomy with apparent cure.   DVT (deep venous thrombosis) Right upper extremity DVT-takes ASA 325mg  daily  HTN (hypertension) Controlled on Coreg 40mg , Cardizem 120mg , and Losartan/HCT 100/25mg , Lisinopril 40mg --update CMP and lipid panel.   COPD exacerbation Stable on Spiriva.   Insomnia Prn Ambien is needed.   Unspecified constipation Controlled on Colace 100mg  daily, MiraLax daily and prn.   Unspecified hypothyroidism Takes Levothyroxine daily, update TSH and CBC  TIA (transient ischemic attack) Presented to ED with resolved left sided weakness and facial droop--MRI brain and CT head unremarkable. Takes ASA.     Family/ Staff Communication: observe the patient.   Goals of Care: SNF  Labs/tests ordered: CBC, CMP, TSH, lipid panel.

## 2012-11-18 NOTE — Assessment & Plan Note (Signed)
Prn Ambien is needed.   

## 2012-11-22 LAB — CBC AND DIFFERENTIAL
HCT: 34 % — AB (ref 36–46)
Platelets: 287 10*3/uL (ref 150–399)
WBC: 4 10^3/mL

## 2012-11-22 LAB — BASIC METABOLIC PANEL
BUN: 18 mg/dL (ref 4–21)
Creatinine: 0.7 mg/dL (ref 0.5–1.1)
Glucose: 89 mg/dL

## 2012-11-22 LAB — HEPATIC FUNCTION PANEL
ALT: 11 U/L (ref 7–35)
AST: 14 U/L (ref 13–35)
Alkaline Phosphatase: 76 U/L (ref 25–125)
Bilirubin, Total: 0.4 mg/dL

## 2012-11-22 LAB — LIPID PANEL
LDL Cholesterol: 65 mg/dL
Triglycerides: 81 mg/dL (ref 40–160)

## 2012-11-25 ENCOUNTER — Non-Acute Institutional Stay (SKILLED_NURSING_FACILITY): Payer: Medicare Other | Admitting: Nurse Practitioner

## 2012-11-25 ENCOUNTER — Encounter: Payer: Self-pay | Admitting: Nurse Practitioner

## 2012-11-25 DIAGNOSIS — G4700 Insomnia, unspecified: Secondary | ICD-10-CM

## 2012-11-25 DIAGNOSIS — R609 Edema, unspecified: Secondary | ICD-10-CM

## 2012-11-25 DIAGNOSIS — K5900 Constipation, unspecified: Secondary | ICD-10-CM | POA: Diagnosis not present

## 2012-11-25 DIAGNOSIS — E039 Hypothyroidism, unspecified: Secondary | ICD-10-CM

## 2012-11-25 DIAGNOSIS — I10 Essential (primary) hypertension: Secondary | ICD-10-CM

## 2012-11-25 NOTE — Progress Notes (Signed)
Patient ID: Dana George, female   DOB: 09-15-1923, 79 y.o.   MRN: [id number] Code Status: DNR  No Known Allergies  Chief Complaint  Patient presents with  . Medical Managment of Chronic Issues    ankle edema.     HPI: Patient is a 79 y.o. female seen in the SNF at [facility] today for evaluation of her ankle edema(denied SOB or cough, sleeps on one pillow at night) and other chronic medical conditions.  Problem List Items Addressed This Visit   Edema - Primary     Ankles/feet--not new but worse--the patient told me diuretics helped in the past-currently on Losartan/HCTZ 100/25--Bun/creat 18/0.69 11/22/12--will add Furosemide 10mg  daily, update BMP in one week.     HTN (hypertension)     Controlled on Carvedilol  12.5mg  bid, Cardizem 120mg , and Losartan/HCT 100/25mg , Lisinopril 20m--Sbp in 140s--added Furosemide 10mg  for edema will benefit BP as well. Her Bun/creat 18/0.69 11/22/12--should monitor renal function frequently since she has ARB and ACEI both for Bp control.       Insomnia     Prn Ambien is needed.       Unspecified constipation     Controlled on Colace 100mg  daily, MiraLax daily and prn.       Unspecified hypothyroidism     Takes Levothyroxine daily, TSH 2.076 11/22/12         Review of Systems:  Review of Systems  Constitutional: Negative for fever, chills, weight loss and malaise/fatigue.       Generalized weakness, w/c for mobility.   HENT: Negative for hearing loss, ear pain, nosebleeds, congestion, sore throat, neck pain, tinnitus and ear discharge.   Eyes: Negative for blurred vision, double vision, photophobia, pain, discharge and redness.  Respiratory: Negative for cough, hemoptysis, sputum production, shortness of breath, wheezing and stridor.   Cardiovascular: Positive for leg swelling. Negative for chest pain, palpitations, orthopnea, claudication and PND.  Gastrointestinal: Negative for heartburn, nausea, vomiting, abdominal pain,  diarrhea, constipation, blood in stool and melena.       Right lower quadrant soreness on and off since her abd surgery. No aggravating or relieving factors. Regular BP interval and consistency. Not interfering her sleep at night.  Genitourinary: Positive for frequency. Negative for dysuria, urgency, hematuria and flank pain.       Adult dependents.   Musculoskeletal: Negative for myalgias, back pain, joint pain and falls.  Skin: Negative for itching and rash.       Left mastectomy scar. Left total knee replacement surgery scar. Mid abd surgical incision healed except the lower end small open area.   Neurological: Positive for weakness. Negative for dizziness, tingling, tremors, sensory change, speech change, focal weakness, seizures, loss of consciousness and headaches.  Endo/Heme/Allergies: Negative for environmental allergies and polydipsia. Bruises/bleeds easily.  Psychiatric/Behavioral: Negative for depression, suicidal ideas, hallucinations, memory loss and substance abuse. The patient has insomnia. The patient is not nervous/anxious.      Past Medical History  Diagnosis Date  . Hypothyroidism   . Hypertension   . Shortness of breath   . COPD (chronic obstructive pulmonary disease)   . Osteoporosis    Past Surgical History  Procedure Laterality Date  . Abdominal hysterectomy    . Total knee arthroplasty  2010    left knee  . Mastectomy  1980    left side  . Tonsillectomy    . Exploratory laparotomy w/ bowel resection      10/19/12-11/01/12 [facility] for exploratory laparotomty with  extensive lysis of adhesion and segmental small bowel resection with end to end anastomosis per Dr. Army Melia 10/22/12   Social History:   reports that she quit smoking about 23 years ago. She does not have any smokeless tobacco history on file. She reports that she drinks about 0.6 ounces of alcohol per week. She reports that she does not use illicit drugs.   Medications: Reviewed at  [facility]   Physical Exam: Physical Exam  Constitutional: She is oriented to person, place, and time. She appears well-developed and well-nourished. No distress.  HENT:  Head: Normocephalic and atraumatic.  Right Ear: External ear normal.  Left Ear: External ear normal.  Nose: Nose normal.  Mouth/Throat: Oropharynx is clear and moist. No oropharyngeal exudate.  Eyes: Conjunctivae and EOM are normal. Pupils are equal, round, and reactive to light. Right eye exhibits no discharge. Left eye exhibits no discharge. No scleral icterus.  Neck: Normal range of motion. Neck supple. No JVD present. No tracheal deviation present. No thyromegaly present.  Cardiovascular: Normal rate, regular rhythm, normal heart sounds and intact distal pulses.   No murmur heard. Pulmonary/Chest: Effort normal. No stridor. No respiratory distress. She has decreased breath sounds. She has no wheezes. She has rales. She exhibits no tenderness.  bibasilar  Abdominal: Soft. She exhibits no distension and no mass. There is no tenderness. There is no rebound and no guarding.  Musculoskeletal: Normal range of motion. She exhibits edema. She exhibits no tenderness.  Ankles and feet 1+  Lymphadenopathy:    She has no cervical adenopathy.  Neurological: She is alert and oriented to person, place, and time. She has normal reflexes. No cranial nerve deficit. She exhibits normal muscle tone. Coordination normal.  Skin: Skin is warm and dry. No rash noted. She is not diaphoretic. No erythema. No pallor.  Mid abd surgical scar-healed except a small open area at the lower end of the incision with serous drainage.   Psychiatric: She has a normal mood and affect. Her behavior is normal. Judgment and thought content normal.  Repetitive.     Filed Vitals:   11/25/12 1220  BP: 138/66  Pulse: 69  Temp: 97.6 F (36.4 C)  TempSrc: Tympanic  Resp: 20    Past Procedures: 11/04/12 MRI of the brain: for resolved left side weakness and  facial droop. CT scan of the same date which showed no acute abnormality. Subtle arrea of restricted diffusion in the right periventricular white matter suggesting subacute ischemia. Diffuse atrophy and fairly extensive chronic small vessel ischemic white matter changes.  11/04/12 Carotid Duplex Scan: moderate plaque seen in both bulb regions with elevated velocities right greater than left in the 50-69% range. Antegrade fow in both vertebrales.  11/05/12 Echocardiogram: normal left ventricular chamber size and systolic function with ejection fraction 60%. Normal right ventricular chamber size and systolic function. Structurally normal aortic valve with mild aortic valve with mild aortic insufficient.    Assessment/Plan Edema Ankles/feet--not new but worse--the patient told me diuretics helped in the past-currently on Losartan/HCTZ 100/25--Bun/creat 18/0.69 11/22/12--will add Furosemide 10mg  daily, update BMP in one week.   HTN (hypertension) Controlled on Carvedilol  12.5mg  bid, Cardizem 120mg , and Losartan/HCT 100/25mg , Lisinopril 19m--Sbp in 140s--added Furosemide 10mg  for edema will benefit BP as well. Her Bun/creat 18/0.69 11/22/12--should monitor renal function frequently since she has ARB and ACEI both for Bp control.     Unspecified hypothyroidism Takes Levothyroxine daily, TSH 2.076 11/22/12    Unspecified constipation Controlled on Colace 100mg  daily, MiraLax  daily and prn.     Insomnia Prn Ambien is needed.       Family/ Staff Communication: observe the patient.   Goals of Care: SNF  Labs/tests ordered:  BMP in one week

## 2012-11-25 NOTE — Assessment & Plan Note (Signed)
Controlled on Colace 100mg daily, MiraLax daily and prn.   

## 2012-11-25 NOTE — Assessment & Plan Note (Signed)
Controlled on Carvedilol  12.5mg  bid, Cardizem 120mg , and Losartan/HCT 100/25mg , Lisinopril 53m--Sbp in 140s--added Furosemide 10mg  for edema will benefit BP as well. Her Bun/creat 18/0.69 11/22/12--should monitor renal function frequently since she has ARB and ACEI both for Bp control.

## 2012-11-25 NOTE — Assessment & Plan Note (Signed)
Prn Ambien is needed.   

## 2012-11-25 NOTE — Assessment & Plan Note (Signed)
Ankles/feet--not new but worse--the patient told me diuretics helped in the past-currently on Losartan/HCTZ 100/25--Bun/creat 18/0.69 11/22/12--will add Furosemide 10mg  daily, update BMP in one week.

## 2012-11-25 NOTE — Assessment & Plan Note (Signed)
Takes Levothyroxine daily, TSH 2.076 11/22/12

## 2012-11-29 ENCOUNTER — Encounter: Payer: Self-pay | Admitting: Nurse Practitioner

## 2012-11-29 ENCOUNTER — Non-Acute Institutional Stay (SKILLED_NURSING_FACILITY): Payer: Medicare Other | Admitting: Nurse Practitioner

## 2012-11-29 DIAGNOSIS — R609 Edema, unspecified: Secondary | ICD-10-CM | POA: Diagnosis not present

## 2012-11-29 DIAGNOSIS — E039 Hypothyroidism, unspecified: Secondary | ICD-10-CM | POA: Diagnosis not present

## 2012-11-29 DIAGNOSIS — I10 Essential (primary) hypertension: Secondary | ICD-10-CM

## 2012-11-29 DIAGNOSIS — K5900 Constipation, unspecified: Secondary | ICD-10-CM | POA: Diagnosis not present

## 2012-11-29 DIAGNOSIS — G4700 Insomnia, unspecified: Secondary | ICD-10-CM

## 2012-11-29 DIAGNOSIS — J441 Chronic obstructive pulmonary disease with (acute) exacerbation: Secondary | ICD-10-CM

## 2012-11-29 NOTE — Assessment & Plan Note (Signed)
Prn Ambien is needed.   

## 2012-11-29 NOTE — Assessment & Plan Note (Signed)
Controlled on Carvedilol  12.5mg bid, Cardizem 120mg, and Losartan/HCT 100/25mg, Lisinopril 40m--Sbp in 140s--added Furosemide 10mg for edema will benefit BP as well. Her Bun/creat 18/0.69 11/22/12--should monitor renal function frequently since she has ARB and ACEI both for Bp control.    

## 2012-11-29 NOTE — Assessment & Plan Note (Addendum)
Ankles/feet--not new and which is better in my eyes todya-mainly in ankles-no noted SOB or cough,  Added  Furosemide 10mg  daily 11/25/12. Wt #155 last week and #154 11/28/12, update BMP in one week pending. Will try compression hosiery on in am and off in pm. Observe the patient and weight daily ac breakfast.

## 2012-11-29 NOTE — Assessment & Plan Note (Signed)
Stable on Spiriva. 

## 2012-11-29 NOTE — Progress Notes (Signed)
Patient ID: Dana George, female   DOB: 1923-12-31, 79 y.o.   MRN: [id number] Code Status: DNR  No Known Allergies  Chief Complaint  Patient presents with  . Medical Managment of Chronic Issues    request from staff to evaluate 3+ lower leg edema.     HPI: Patient is a 79 y.o. female seen in the SNF at [facility] today for evaluation of her ankle edema(denied SOB or cough, sleeps on one pillow at night) and other chronic medical conditions.  Problem List Items Addressed This Visit   COPD exacerbation     Stable on Spiriva.       Edema - Primary     Ankles/feet--not new and which is better in my eyes todya-mainly in ankles-no noted SOB or cough,  Added  Furosemide 10mg  daily 11/25/12. Wt #155 last week and #154 11/28/12, update BMP in one week pending. Will try compression hosiery on in am and off in pm. Observe the patient and weight daily ac breakfast.       HTN (hypertension)     Controlled on Carvedilol  12.5mg  bid, Cardizem 120mg , and Losartan/HCT 100/25mg , Lisinopril 16m--Sbp in 140s--added Furosemide 10mg  for edema will benefit BP as well. Her Bun/creat 18/0.69 11/22/12--should monitor renal function frequently since she has ARB and ACEI both for Bp control.         Insomnia     Prn Ambien is needed.         Unspecified constipation     Controlled on Colace 100mg  daily, MiraLax daily and prn.         Unspecified hypothyroidism     Takes Levothyroxine daily, TSH 2.076 11/22/12           Review of Systems:  Review of Systems  Constitutional: Negative for fever, chills, weight loss and malaise/fatigue.       Generalized weakness, w/c for mobility.   HENT: Negative for hearing loss, ear pain, nosebleeds, congestion, sore throat, neck pain, tinnitus and ear discharge.   Eyes: Negative for blurred vision, double vision, photophobia, pain, discharge and redness.  Respiratory: Negative for cough, hemoptysis, sputum production, shortness of breath,  wheezing and stridor.   Cardiovascular: Positive for leg swelling. Negative for chest pain, palpitations, orthopnea, claudication and PND.  Gastrointestinal: Negative for heartburn, nausea, vomiting, abdominal pain, diarrhea, constipation, blood in stool and melena.       Right lower quadrant soreness on and off since her abd surgery. No aggravating or relieving factors. Regular BP interval and consistency. Not interfering her sleep at night.  Genitourinary: Positive for frequency. Negative for dysuria, urgency, hematuria and flank pain.       Adult dependents.   Musculoskeletal: Negative for myalgias, back pain, joint pain and falls.  Skin: Negative for itching and rash.       Left mastectomy scar. Left total knee replacement surgery scar. Mid abd surgical incision healed except the lower end small open area.   Neurological: Positive for weakness. Negative for dizziness, tingling, tremors, sensory change, speech change, focal weakness, seizures, loss of consciousness and headaches.  Endo/Heme/Allergies: Negative for environmental allergies and polydipsia. Bruises/bleeds easily.  Psychiatric/Behavioral: Negative for depression, suicidal ideas, hallucinations, memory loss and substance abuse. The patient has insomnia. The patient is not nervous/anxious.      Past Medical History  Diagnosis Date  . Hypothyroidism   . Hypertension   . Shortness of breath   . COPD (chronic obstructive pulmonary disease)   . Osteoporosis  Past Surgical History  Procedure Laterality Date  . Abdominal hysterectomy    . Total knee arthroplasty  2010    left knee  . Mastectomy  1980    left side  . Tonsillectomy    . Exploratory laparotomy w/ bowel resection      10/19/12-11/01/12 [facility] for exploratory laparotomty with extensive lysis of adhesion and segmental small bowel resection with end to end anastomosis per Dr. Army Melia 10/22/12   Social History:   reports that she quit smoking about 23 years  ago. She does not have any smokeless tobacco history on file. She reports that she drinks about 0.6 ounces of alcohol per week. She reports that she does not use illicit drugs.   Medications: Reviewed at [facility]   Physical Exam: Physical Exam  Constitutional: She is oriented to person, place, and time. She appears well-developed and well-nourished. No distress.  HENT:  Head: Normocephalic and atraumatic.  Right Ear: External ear normal.  Left Ear: External ear normal.  Nose: Nose normal.  Mouth/Throat: Oropharynx is clear and moist. No oropharyngeal exudate.  Eyes: Conjunctivae and EOM are normal. Pupils are equal, round, and reactive to light. Right eye exhibits no discharge. Left eye exhibits no discharge. No scleral icterus.  Neck: Normal range of motion. Neck supple. No JVD present. No tracheal deviation present. No thyromegaly present.  Cardiovascular: Normal rate, regular rhythm, normal heart sounds and intact distal pulses.   No murmur heard. Pulmonary/Chest: Effort normal. No stridor. No respiratory distress. She has decreased breath sounds. She has no wheezes. She has rales. She exhibits no tenderness.  bibasilar  Abdominal: Soft. She exhibits no distension and no mass. There is no tenderness. There is no rebound and no guarding.  Musculoskeletal: Normal range of motion. She exhibits edema. She exhibits no tenderness.  Ankles and feet 1+  Lymphadenopathy:    She has no cervical adenopathy.  Neurological: She is alert and oriented to person, place, and time. She has normal reflexes. No cranial nerve deficit. She exhibits normal muscle tone. Coordination normal.  Skin: Skin is warm and dry. No rash noted. She is not diaphoretic. No erythema. No pallor.  Mid abd surgical scar-healed except a small open area at the lower end of the incision with serous drainage.   Psychiatric: She has a normal mood and affect. Her behavior is normal. Judgment and thought content normal.  Repetitive.      Filed Vitals:   11/29/12 1326  BP: 160/82  Pulse: 74  Temp: 97.3 F (36.3 C)  TempSrc: Tympanic  Resp: 16    Past Procedures: 11/04/12 MRI of the brain: for resolved left side weakness and facial droop. CT scan of the same date which showed no acute abnormality. Subtle arrea of restricted diffusion in the right periventricular white matter suggesting subacute ischemia. Diffuse atrophy and fairly extensive chronic small vessel ischemic white matter changes.  11/04/12 Carotid Duplex Scan: moderate plaque seen in both bulb regions with elevated velocities right greater than left in the 50-69% range. Antegrade fow in both vertebrales.  11/05/12 Echocardiogram: normal left ventricular chamber size and systolic function with ejection fraction 60%. Normal right ventricular chamber size and systolic function. Structurally normal aortic valve with mild aortic valve with mild aortic insufficient.    Assessment/Plan Edema Ankles/feet--not new and which is better in my eyes todya-mainly in ankles-no noted SOB or cough,  Added  Furosemide 10mg  daily 11/25/12. Wt #155 last week and #154 11/28/12, update BMP in one week pending. Will  try compression hosiery on in am and off in pm. Observe the patient and weight daily ac breakfast.     Unspecified hypothyroidism Takes Levothyroxine daily, TSH 2.076 11/22/12      Unspecified constipation Controlled on Colace 100mg  daily, MiraLax daily and prn.       Insomnia Prn Ambien is needed.       HTN (hypertension) Controlled on Carvedilol  12.5mg  bid, Cardizem 120mg , and Losartan/HCT 100/25mg , Lisinopril 12m--Sbp in 140s--added Furosemide 10mg  for edema will benefit BP as well. Her Bun/creat 18/0.69 11/22/12--should monitor renal function frequently since she has ARB and ACEI both for Bp control.       COPD exacerbation Stable on Spiriva.       Family/ Staff Communication: observe the patient.   Goals of Care: SNF  Labs/tests  ordered:  BMP in one week pending.

## 2012-11-29 NOTE — Assessment & Plan Note (Signed)
Takes Levothyroxine 50mcg daily, TSH 2.076 11/22/12   

## 2012-11-29 NOTE — Assessment & Plan Note (Signed)
Controlled on Colace 100mg daily, MiraLax daily and prn.   

## 2012-12-01 LAB — BASIC METABOLIC PANEL: Creatinine: 0.8 mg/dL (ref 0.5–1.1)

## 2012-12-23 DIAGNOSIS — Z961 Presence of intraocular lens: Secondary | ICD-10-CM | POA: Diagnosis not present

## 2012-12-23 DIAGNOSIS — H53469 Homonymous bilateral field defects, unspecified side: Secondary | ICD-10-CM | POA: Diagnosis not present

## 2013-01-03 ENCOUNTER — Non-Acute Institutional Stay (SKILLED_NURSING_FACILITY): Payer: Medicare Other | Admitting: Nurse Practitioner

## 2013-01-03 ENCOUNTER — Encounter: Payer: Self-pay | Admitting: Nurse Practitioner

## 2013-01-03 DIAGNOSIS — E039 Hypothyroidism, unspecified: Secondary | ICD-10-CM | POA: Diagnosis not present

## 2013-01-03 DIAGNOSIS — R609 Edema, unspecified: Secondary | ICD-10-CM

## 2013-01-03 DIAGNOSIS — I82409 Acute embolism and thrombosis of unspecified deep veins of unspecified lower extremity: Secondary | ICD-10-CM

## 2013-01-03 DIAGNOSIS — K5900 Constipation, unspecified: Secondary | ICD-10-CM | POA: Diagnosis not present

## 2013-01-03 DIAGNOSIS — I82401 Acute embolism and thrombosis of unspecified deep veins of right lower extremity: Secondary | ICD-10-CM

## 2013-01-03 DIAGNOSIS — L89890 Pressure ulcer of other site, unstageable: Secondary | ICD-10-CM

## 2013-01-03 DIAGNOSIS — L8995 Pressure ulcer of unspecified site, unstageable: Secondary | ICD-10-CM

## 2013-01-03 DIAGNOSIS — I10 Essential (primary) hypertension: Secondary | ICD-10-CM

## 2013-01-03 DIAGNOSIS — G4700 Insomnia, unspecified: Secondary | ICD-10-CM

## 2013-01-03 DIAGNOSIS — L89899 Pressure ulcer of other site, unspecified stage: Secondary | ICD-10-CM | POA: Diagnosis not present

## 2013-01-03 NOTE — Assessment & Plan Note (Signed)
Controlled on Carvedilol  12.5mg  bid, Cardizem 120mg , and Losartan/HCT 100/25mg -dc per consulting pharmacist recommendation, Lisinopril 16m and Furosemide 10mg  for edema. Monitor Bp, edema, BMP

## 2013-01-03 NOTE — Assessment & Plan Note (Signed)
Right upper extremity DVT-takes ASA 325mg  daily

## 2013-01-03 NOTE — Progress Notes (Signed)
Patient ID: Dana George, female   DOB: 1924/03/12, 79 y.o.   MRN: [id number]  Code Status: DNR  No Known Allergies  Chief Complaint  Patient presents with  . Medical Managment of Chronic Issues    the right 3rd toe blister, BLE edema    HPI: Patient is a 79 y.o. female seen in the SNF at [facility] today for evaluation of her ankle edema, the right 3rd toe blister, HTN,  and other chronic medical conditions.  Problem List Items Addressed This Visit   DVT (deep venous thrombosis)     Right upper extremity DVT-takes ASA 325mg  daily      Edema     Ankles/feet--takes Furosemide 10mg  daily 11/25/12. No significant weight change. Dc Losartan/Hct--Monitor for edema and Bp        HTN (hypertension)     Controlled on Carvedilol  12.5mg  bid, Cardizem 120mg , and Losartan/HCT 100/25mg -dc per consulting pharmacist recommendation, Lisinopril 79m and Furosemide 10mg  for edema. Monitor Bp, edema, BMP          Insomnia     Prn Ambien is needed.           Pressure ulcer of toe     The right 3rd toe: blister on the top of the right 3rd toe--open toe TED and shoes--monitor.     Unspecified constipation     Controlled on Colace 100mg  daily, MiraLax daily and prn.           Unspecified hypothyroidism - Primary     Takes Levothyroxine daily, TSH 2.076 11/22/12             Review of Systems:  Review of Systems  Constitutional: Negative for fever, chills, weight loss and malaise/fatigue.       Generalized weakness, w/c for mobility.   HENT: Negative for congestion, ear discharge, ear pain, hearing loss, nosebleeds, sore throat and tinnitus.   Eyes: Negative for blurred vision, double vision, photophobia, pain, discharge and redness.  Respiratory: Negative for cough, hemoptysis, sputum production, shortness of breath, wheezing and stridor.   Cardiovascular: Positive for leg swelling. Negative for chest pain, palpitations, orthopnea, claudication and PND.   Gastrointestinal: Negative for heartburn, nausea, vomiting, abdominal pain, diarrhea, constipation, blood in stool and melena.       Right lower quadrant soreness on and off since her abd surgery. No aggravating or relieving factors. Regular BP interval and consistency. Not interfering her sleep at night.  Genitourinary: Positive for frequency. Negative for dysuria, urgency, hematuria and flank pain.       Adult dependents.   Musculoskeletal: Negative for back pain, falls, joint pain, myalgias and neck pain.  Skin: Negative for itching and rash.       Left mastectomy scar. Left total knee replacement surgery scar. Mid abd surgical incision healed except the lower end small open area. A blister on the top of the right 3rd toe  Neurological: Positive for weakness. Negative for dizziness, tingling, tremors, sensory change, speech change, focal weakness, seizures, loss of consciousness and headaches.  Endo/Heme/Allergies: Negative for environmental allergies and polydipsia. Bruises/bleeds easily.  Psychiatric/Behavioral: Negative for depression, suicidal ideas, hallucinations, memory loss and substance abuse. The patient has insomnia. The patient is not nervous/anxious.      Past Medical History  Diagnosis Date  . Hypothyroidism   . Hypertension   . Shortness of breath   . COPD (chronic obstructive pulmonary disease)   . Osteoporosis    Past Surgical History  Procedure Laterality Date  .  Abdominal hysterectomy    . Total knee arthroplasty  2010    left knee  . Mastectomy  1980    left side  . Tonsillectomy    . Exploratory laparotomy w/ bowel resection      10/19/12-11/01/12 [facility] for exploratory laparotomty with extensive lysis of adhesion and segmental small bowel resection with end to end anastomosis per Dr. Army Melia 10/22/12   Social History:   reports that she quit smoking about 23 years ago. She does not have any smokeless tobacco history on file. She reports that she drinks  about 0.6 ounces of alcohol per week. She reports that she does not use illicit drugs.   Medications: Reviewed at [facility]   Physical Exam: Physical Exam  Constitutional: She is oriented to person, place, and time. She appears well-developed and well-nourished. No distress.  HENT:  Head: Normocephalic and atraumatic.  Right Ear: External ear normal.  Left Ear: External ear normal.  Nose: Nose normal.  Mouth/Throat: Oropharynx is clear and moist. No oropharyngeal exudate.  Eyes: Conjunctivae and EOM are normal. Pupils are equal, round, and reactive to light. Right eye exhibits no discharge. Left eye exhibits no discharge. No scleral icterus.  Neck: Normal range of motion. Neck supple. No JVD present. No tracheal deviation present. No thyromegaly present.  Cardiovascular: Normal rate, regular rhythm, normal heart sounds and intact distal pulses.   No murmur heard. Pulmonary/Chest: Effort normal. No stridor. No respiratory distress. She has decreased breath sounds. She has no wheezes. She has rales. She exhibits no tenderness.  bibasilar  Abdominal: Soft. She exhibits no distension and no mass. There is no tenderness. There is no rebound and no guarding.  Musculoskeletal: Normal range of motion. She exhibits edema. She exhibits no tenderness.  Ankles and feet 1+  Lymphadenopathy:    She has no cervical adenopathy.  Neurological: She is alert and oriented to person, place, and time. She has normal reflexes. No cranial nerve deficit. She exhibits normal muscle tone. Coordination normal.  Skin: Skin is warm and dry. No rash noted. She is not diaphoretic. No erythema. No pallor.  Mid abd surgical scar-healed except a small open area at the lower end of the incision with serous drainage. A blister on the top of the right 3rd toe  Psychiatric: She has a normal mood and affect. Her behavior is normal. Judgment and thought content normal.  Repetitive.     Filed Vitals:   01/03/13 1616  BP: 142/68   Pulse: 79  Temp: 97.1 F (36.2 C)  TempSrc: Tympanic  Resp: 20    Past Procedures: 11/04/12 MRI of the brain: for resolved left side weakness and facial droop. CT scan of the same date which showed no acute abnormality. Subtle arrea of restricted diffusion in the right periventricular white matter suggesting subacute ischemia. Diffuse atrophy and fairly extensive chronic small vessel ischemic white matter changes.  11/04/12 Carotid Duplex Scan: moderate plaque seen in both bulb regions with elevated velocities right greater than left in the 50-69% range. Antegrade fow in both vertebrales.  11/05/12 Echocardiogram: normal left ventricular chamber size and systolic function with ejection fraction 60%. Normal right ventricular chamber size and systolic function. Structurally normal aortic valve with mild aortic valve with mild aortic insufficient.    Assessment/Plan Unspecified hypothyroidism Takes Levothyroxine daily, TSH 2.076 11/22/12        Unspecified constipation Controlled on Colace 100mg  daily, MiraLax daily and prn.         Insomnia Prn Ambien  is needed.         HTN (hypertension) Controlled on Carvedilol  12.5mg  bid, Cardizem 120mg , and Losartan/HCT 100/25mg -dc per consulting pharmacist recommendation, Lisinopril 68m and Furosemide 10mg  for edema. Monitor Bp, edema, BMP        Edema Ankles/feet--takes Furosemide 10mg  daily 11/25/12. No significant weight change. Dc Losartan/Hct--Monitor for edema and Bp      DVT (deep venous thrombosis) Right upper extremity DVT-takes ASA 325mg  daily    Pressure ulcer of toe The right 3rd toe: blister on the top of the right 3rd toe--open toe TED and shoes--monitor.     Family/ Staff Communication: observe the patient.   Goals of Care: AL  Labs/tests ordered:  BMP in one week

## 2013-01-03 NOTE — Assessment & Plan Note (Signed)
Prn Ambien is needed.   

## 2013-01-03 NOTE — Assessment & Plan Note (Signed)
Controlled on Colace 100mg daily, MiraLax daily and prn.   

## 2013-01-03 NOTE — Assessment & Plan Note (Signed)
Ankles/feet--takes Furosemide 10mg  daily 11/25/12. No significant weight change. Dc Losartan/Hct--Monitor for edema and Bp

## 2013-01-03 NOTE — Assessment & Plan Note (Signed)
The right 3rd toe: blister on the top of the right 3rd toe--open toe TED and shoes--monitor.

## 2013-01-03 NOTE — Assessment & Plan Note (Signed)
Takes Levothyroxine daily, TSH 2.076 11/22/12

## 2013-01-09 DIAGNOSIS — I10 Essential (primary) hypertension: Secondary | ICD-10-CM | POA: Diagnosis not present

## 2013-01-09 LAB — BASIC METABOLIC PANEL
BUN: 15 mg/dL (ref 4–21)
Creatinine: 0.6 mg/dL (ref 0.5–1.1)
Potassium: 4.8 mmol/L (ref 3.4–5.3)

## 2013-01-16 DIAGNOSIS — R269 Unspecified abnormalities of gait and mobility: Secondary | ICD-10-CM | POA: Diagnosis not present

## 2013-01-16 DIAGNOSIS — M6281 Muscle weakness (generalized): Secondary | ICD-10-CM | POA: Diagnosis not present

## 2013-01-17 DIAGNOSIS — M6281 Muscle weakness (generalized): Secondary | ICD-10-CM | POA: Diagnosis not present

## 2013-01-17 DIAGNOSIS — R269 Unspecified abnormalities of gait and mobility: Secondary | ICD-10-CM | POA: Diagnosis not present

## 2013-01-19 DIAGNOSIS — M6281 Muscle weakness (generalized): Secondary | ICD-10-CM | POA: Diagnosis not present

## 2013-01-19 DIAGNOSIS — R269 Unspecified abnormalities of gait and mobility: Secondary | ICD-10-CM | POA: Diagnosis not present

## 2013-02-07 ENCOUNTER — Non-Acute Institutional Stay: Payer: Medicare Other | Admitting: Nurse Practitioner

## 2013-02-07 ENCOUNTER — Encounter: Payer: Self-pay | Admitting: Nurse Practitioner

## 2013-02-07 DIAGNOSIS — K5900 Constipation, unspecified: Secondary | ICD-10-CM | POA: Diagnosis not present

## 2013-02-07 DIAGNOSIS — E039 Hypothyroidism, unspecified: Secondary | ICD-10-CM | POA: Diagnosis not present

## 2013-02-07 DIAGNOSIS — G4700 Insomnia, unspecified: Secondary | ICD-10-CM

## 2013-02-07 DIAGNOSIS — I10 Essential (primary) hypertension: Secondary | ICD-10-CM

## 2013-02-07 DIAGNOSIS — R609 Edema, unspecified: Secondary | ICD-10-CM

## 2013-02-07 DIAGNOSIS — W19XXXS Unspecified fall, sequela: Secondary | ICD-10-CM

## 2013-02-07 DIAGNOSIS — W19XXXA Unspecified fall, initial encounter: Secondary | ICD-10-CM | POA: Insufficient documentation

## 2013-02-07 DIAGNOSIS — R079 Chest pain, unspecified: Secondary | ICD-10-CM | POA: Insufficient documentation

## 2013-02-07 HISTORY — DX: Unspecified fall, initial encounter: W19.XXXA

## 2013-02-07 NOTE — Assessment & Plan Note (Signed)
Controlled on Colace 100mg daily, MiraLax daily and prn.   

## 2013-02-07 NOTE — Assessment & Plan Note (Signed)
Prn Ambien is needed.   

## 2013-02-07 NOTE — Progress Notes (Signed)
Patient ID: Dana George, female   DOB: 10-28-1923, 79 y.o.   MRN: [id number]  Code Status: DNR  No Known Allergies  Chief Complaint  Patient presents with  . Medical Managment of Chronic Issues    fell, bruise the left forehead, neck, chest, c/o left chest wall pain with breath., elevated blood pressure,    HPI: Patient is a 79 y.o. female seen in the AL at [facility] today for evaluation of s/p fall, bruise left forehead/neck/chest, left sided pain with deep breath,  HTN,  and other chronic medical conditions.  Problem List Items Addressed This Visit   Chest pain     Sustained from the fall. C/o pain in her left chest and flank with deep breath. Will check UA C/S, CBC, CXR, X-ray Ribs    Edema     Ankles/feet--takes Furosemide 10mg  daily 11/25/12. No significant weight change.           Fall     The patient stated she fell of her recliner w/o knowing what has happened.     HTN (hypertension) - Primary     Elevated in 170s/80s-will add Clonidine 0.1mg  daily. Continue Carvedilol  12.5mg  bid, Cardizem 120mg , Lisinopril 29m, and Furosemide 10mg  for edema. Monitor Bp. Update BMP            Relevant Medications      cloNIDine (CATAPRES) 0.1 MG tablet   Insomnia     Prn Ambien is needed.             Unspecified constipation     Controlled on Colace 100mg  daily, MiraLax daily and prn.             Unspecified hypothyroidism     Takes Levothyroxine daily, TSH 2.076 11/22/12               Review of Systems:  Review of Systems  Constitutional: Negative for fever, chills, weight loss and malaise/fatigue.  HENT: Negative for congestion, ear discharge, ear pain, hearing loss, nosebleeds, sore throat and tinnitus.   Eyes: Negative for blurred vision, double vision, photophobia, pain, discharge and redness.  Respiratory: Negative for cough, hemoptysis, sputum production, shortness of breath, wheezing and stridor.   Cardiovascular:  Positive for leg swelling. Negative for chest pain, palpitations, orthopnea, claudication and PND.  Gastrointestinal: Negative for heartburn, nausea, vomiting, abdominal pain, diarrhea, constipation, blood in stool and melena.  Genitourinary: Positive for frequency. Negative for dysuria, urgency, hematuria and flank pain.  Musculoskeletal: Negative for back pain, falls, joint pain, myalgias and neck pain. Left sided chest/flank pain with deep breath. Fell 2 days ago.  Skin: Negative for itching and rash. Bruise left forehead, neck, chest.       Left mastectomy scar. Left total knee replacement surgery scar. Mid abd surgical scar.  Neurological: Positive for weakness. Negative for dizziness, tingling, tremors, sensory change, speech change, focal weakness, seizures, loss of consciousness and headaches.  Endo/Heme/Allergies: Negative for environmental allergies and polydipsia. Bruises/bleeds easily.  Psychiatric/Behavioral: Negative for depression, suicidal ideas, hallucinations, memory loss and substance abuse. The patient has insomnia. The patient is not nervous/anxious.      Past Medical History  Diagnosis Date  . Hypothyroidism   . Hypertension   . Shortness of breath   . COPD (chronic obstructive pulmonary disease)   . Osteoporosis    Past Surgical History  Procedure Laterality Date  . Abdominal hysterectomy    . Total knee arthroplasty  2010    left knee  .  Mastectomy  1980    left side  . Tonsillectomy    . Exploratory laparotomy w/ bowel resection      10/19/12-11/01/12 [facility] for exploratory laparotomty with extensive lysis of adhesion and segmental small bowel resection with end to end anastomosis per Dr. Army Melia 10/22/12   Social History:   reports that she quit smoking about 24 years ago. She does not have any smokeless tobacco history on file. She reports that she drinks about 0.6 ounces of alcohol per week. She reports that she does not use illicit  drugs.   Medications: Reviewed at [facility]   Physical Exam: Physical Exam  Constitutional: She is oriented to person, place, and time. She appears well-developed and well-nourished. No distress.  HENT:  Head: Normocephalic and atraumatic.  Right Ear: External ear normal.  Left Ear: External ear normal.  Nose: Nose normal.  Mouth/Throat: Oropharynx is clear and moist. No oropharyngeal exudate.  Eyes: Conjunctivae and EOM are normal. Pupils are equal, round, and reactive to light. Right eye exhibits no discharge. Left eye exhibits no discharge. No scleral icterus.  Neck: Normal range of motion. Neck supple. No JVD present. No tracheal deviation present. No thyromegaly present.  Cardiovascular: Normal rate, regular rhythm, normal heart sounds and intact distal pulses.   No murmur heard. Pulmonary/Chest: Effort normal. No stridor. No respiratory distress. She has decreased breath sounds. She has no wheezes. She has rales. She exhibits no tenderness.  bibasilar  Abdominal: Soft. She exhibits no distension and no mass. There is no tenderness. There is no rebound and no guarding.  Musculoskeletal: Normal range of motion. She exhibits edema. She exhibits no tenderness.  Ankles and feet 1+ . Left chest and flank pain with deep breath.  Lymphadenopathy:    She has no cervical adenopathy.  Neurological: She is alert and oriented to person, place, and time. She has normal reflexes. No cranial nerve deficit. She exhibits normal muscle tone. Coordination normal.  Skin: Skin is warm and dry. No rash noted. She is not diaphoretic. No erythema. No pallor.  Mid abd surgical scar. Bruise left forehead/neck/chest.  Psychiatric: She has a normal mood and affect. Her behavior is normal. Judgment and thought content normal.  Repetitive.     Filed Vitals:   02/07/13 1328  BP: 172/88  Pulse: 70  Temp: 97.2 F (36.2 C)  TempSrc: Tympanic  Resp: 20    Past Procedures: 11/04/12 MRI of the brain: for  resolved left side weakness and facial droop. CT scan of the same date which showed no acute abnormality. Subtle arrea of restricted diffusion in the right periventricular white matter suggesting subacute ischemia. Diffuse atrophy and fairly extensive chronic small vessel ischemic white matter changes.  11/04/12 Carotid Duplex Scan: moderate plaque seen in both bulb regions with elevated velocities right greater than left in the 50-69% range. Antegrade fow in both vertebrales.  11/05/12 Echocardiogram: normal left ventricular chamber size and systolic function with ejection fraction 60%. Normal right ventricular chamber size and systolic function. Structurally normal aortic valve with mild aortic valve with mild aortic insufficient.    Assessment/Plan HTN (hypertension) Elevated in 170s/80s-will add Clonidine 0.1mg  daily. Continue Carvedilol  12.5mg  bid, Cardizem 120mg , Lisinopril 8m, and Furosemide 10mg  for edema. Monitor Bp. Update BMP          Unspecified constipation Controlled on Colace 100mg  daily, MiraLax daily and prn.           Unspecified hypothyroidism Takes Levothyroxine daily, TSH 2.076 11/22/12  Insomnia Prn Ambien is needed.           Edema Ankles/feet--takes Furosemide 10mg  daily 11/25/12. No significant weight change.         Fall The patient stated she fell of her recliner w/o knowing what has happened.   Chest pain Sustained from the fall. C/o pain in her left chest and flank with deep breath. Will check UA C/S, CBC, CXR, X-ray Ribs    Family/ Staff Communication: observe the patient.   Goals of Care: AL  Labs/tests ordered:  X-ray left rib, CXR, CBC, BMP, Cath UA C/S

## 2013-02-07 NOTE — Assessment & Plan Note (Signed)
Ankles/feet--takes Furosemide 10mg daily 11/25/12. No significant weight change.    

## 2013-02-07 NOTE — Assessment & Plan Note (Signed)
The patient stated she fell of her recliner w/o knowing what has happened.

## 2013-02-07 NOTE — Assessment & Plan Note (Addendum)
Sustained from the fall. C/o pain in her left chest and flank with deep breath. Will check UA C/S, CBC, CXR, X-ray Ribs

## 2013-02-07 NOTE — Assessment & Plan Note (Signed)
Takes Levothyroxine 50mcg daily, TSH 2.076 11/22/12   

## 2013-02-07 NOTE — Assessment & Plan Note (Signed)
Elevated in 170s/80s-will add Clonidine 0.1mg  daily. Continue Carvedilol  12.5mg  bid, Cardizem 120mg , Lisinopril 72m, and Furosemide 10mg  for edema. Monitor Bp. Update BMP

## 2013-02-09 DIAGNOSIS — I10 Essential (primary) hypertension: Secondary | ICD-10-CM | POA: Diagnosis not present

## 2013-02-09 DIAGNOSIS — D649 Anemia, unspecified: Secondary | ICD-10-CM | POA: Diagnosis not present

## 2013-02-09 DIAGNOSIS — N390 Urinary tract infection, site not specified: Secondary | ICD-10-CM | POA: Diagnosis not present

## 2013-02-20 DIAGNOSIS — F341 Dysthymic disorder: Secondary | ICD-10-CM | POA: Diagnosis not present

## 2013-02-20 DIAGNOSIS — K5900 Constipation, unspecified: Secondary | ICD-10-CM | POA: Diagnosis not present

## 2013-02-20 DIAGNOSIS — Z8679 Personal history of other diseases of the circulatory system: Secondary | ICD-10-CM | POA: Diagnosis not present

## 2013-02-20 DIAGNOSIS — I82409 Acute embolism and thrombosis of unspecified deep veins of unspecified lower extremity: Secondary | ICD-10-CM | POA: Diagnosis not present

## 2013-02-20 DIAGNOSIS — E039 Hypothyroidism, unspecified: Secondary | ICD-10-CM | POA: Diagnosis not present

## 2013-02-20 DIAGNOSIS — I10 Essential (primary) hypertension: Secondary | ICD-10-CM | POA: Diagnosis not present

## 2013-02-20 DIAGNOSIS — Z853 Personal history of malignant neoplasm of breast: Secondary | ICD-10-CM | POA: Diagnosis not present

## 2013-02-20 DIAGNOSIS — J449 Chronic obstructive pulmonary disease, unspecified: Secondary | ICD-10-CM | POA: Diagnosis not present

## 2013-03-13 DIAGNOSIS — R627 Adult failure to thrive: Secondary | ICD-10-CM | POA: Diagnosis not present

## 2013-03-13 DIAGNOSIS — F341 Dysthymic disorder: Secondary | ICD-10-CM | POA: Diagnosis not present

## 2013-03-13 DIAGNOSIS — E039 Hypothyroidism, unspecified: Secondary | ICD-10-CM | POA: Diagnosis not present

## 2013-03-13 DIAGNOSIS — J449 Chronic obstructive pulmonary disease, unspecified: Secondary | ICD-10-CM | POA: Diagnosis not present

## 2013-03-13 DIAGNOSIS — I10 Essential (primary) hypertension: Secondary | ICD-10-CM | POA: Diagnosis not present

## 2013-03-13 DIAGNOSIS — K5900 Constipation, unspecified: Secondary | ICD-10-CM | POA: Diagnosis not present

## 2013-03-13 DIAGNOSIS — G4700 Insomnia, unspecified: Secondary | ICD-10-CM | POA: Diagnosis not present

## 2013-03-13 DIAGNOSIS — Z6828 Body mass index (BMI) 28.0-28.9, adult: Secondary | ICD-10-CM | POA: Diagnosis not present

## 2013-04-08 ENCOUNTER — Emergency Department (HOSPITAL_COMMUNITY)
Admission: EM | Admit: 2013-04-08 | Discharge: 2013-04-09 | Disposition: A | Discharge status: Home / Self Care | Payer: Medicare Other | Attending: Emergency Medicine | Admitting: Emergency Medicine

## 2013-04-08 ENCOUNTER — Encounter (HOSPITAL_COMMUNITY): Payer: Self-pay | Admitting: Emergency Medicine

## 2013-04-08 DIAGNOSIS — Y939 Activity, unspecified: Secondary | ICD-10-CM | POA: Diagnosis not present

## 2013-04-08 DIAGNOSIS — S01309A Unspecified open wound of unspecified ear, initial encounter: Secondary | ICD-10-CM | POA: Insufficient documentation

## 2013-04-08 DIAGNOSIS — S0100XA Unspecified open wound of scalp, initial encounter: Secondary | ICD-10-CM | POA: Diagnosis not present

## 2013-04-08 DIAGNOSIS — W1809XA Striking against other object with subsequent fall, initial encounter: Secondary | ICD-10-CM | POA: Insufficient documentation

## 2013-04-08 DIAGNOSIS — Y92009 Unspecified place in unspecified non-institutional (private) residence as the place of occurrence of the external cause: Secondary | ICD-10-CM | POA: Insufficient documentation

## 2013-04-08 DIAGNOSIS — W06XXXA Fall from bed, initial encounter: Secondary | ICD-10-CM | POA: Insufficient documentation

## 2013-04-08 DIAGNOSIS — Z23 Encounter for immunization: Secondary | ICD-10-CM | POA: Diagnosis not present

## 2013-04-08 DIAGNOSIS — Z79899 Other long term (current) drug therapy: Secondary | ICD-10-CM | POA: Diagnosis not present

## 2013-04-08 DIAGNOSIS — I10 Essential (primary) hypertension: Secondary | ICD-10-CM | POA: Diagnosis not present

## 2013-04-08 DIAGNOSIS — Z859 Personal history of malignant neoplasm, unspecified: Secondary | ICD-10-CM | POA: Diagnosis not present

## 2013-04-08 DIAGNOSIS — J449 Chronic obstructive pulmonary disease, unspecified: Secondary | ICD-10-CM | POA: Insufficient documentation

## 2013-04-08 DIAGNOSIS — M810 Age-related osteoporosis without current pathological fracture: Secondary | ICD-10-CM | POA: Insufficient documentation

## 2013-04-08 DIAGNOSIS — J4489 Other specified chronic obstructive pulmonary disease: Secondary | ICD-10-CM | POA: Diagnosis not present

## 2013-04-08 DIAGNOSIS — Z7982 Long term (current) use of aspirin: Secondary | ICD-10-CM | POA: Insufficient documentation

## 2013-04-08 DIAGNOSIS — E039 Hypothyroidism, unspecified: Secondary | ICD-10-CM | POA: Insufficient documentation

## 2013-04-08 DIAGNOSIS — T1490XA Injury, unspecified, initial encounter: Secondary | ICD-10-CM | POA: Diagnosis not present

## 2013-04-08 DIAGNOSIS — S01319A Laceration without foreign body of unspecified ear, initial encounter: Secondary | ICD-10-CM

## 2013-04-08 DIAGNOSIS — Z87891 Personal history of nicotine dependence: Secondary | ICD-10-CM | POA: Insufficient documentation

## 2013-04-08 HISTORY — DX: Malignant (primary) neoplasm, unspecified: C80.1

## 2013-04-08 MED ORDER — TETANUS-DIPHTH-ACELL PERTUSSIS 5-2.5-18.5 LF-MCG/0.5 IM SUSP
0.5000 mL | Freq: Once | INTRAMUSCULAR | Status: AC
  Administered 2013-04-08: 0.5 mL via INTRAMUSCULAR
  Filled 2013-04-08: qty 0.5

## 2013-04-08 NOTE — ED Notes (Signed)
Per EMS, pt.'s bed was unlocked and rolled to hit wall and glass cut the back of her L ear. Laceration is no longer bleeding. Pt denies pain, A&Ox4. NAD noted.

## 2013-04-08 NOTE — ED Notes (Addendum)
Dermabond at bedside. PA made aware.

## 2013-04-08 NOTE — ED Notes (Signed)
Pt sts her bed rolled backwards and she slid down the side of hit bumping her head. Pt sts she had her glasses on her head and they cut her behind the ear. Laceration is not actively bleeding. Pt denies pain or loss of consciousness.  Pt denies dizziness at the moment but sts that getting up after the incident she was dizzy. A&Ox4. NAD ntoed.

## 2013-04-08 NOTE — ED Provider Notes (Signed)
CSN: [id number]     Arrival date & time 04/08/13  2101 History   First MD Initiated Contact with Patient 04/08/13 2121     Chief Complaint  Patient presents with  . Ear Laceration   (Consider location/radiation/quality/duration/timing/severity/associated sxs/prior Treatment) Patient is a 80 y.o. female presenting with skin laceration. The history is provided by the patient. No language interpreter was used.  Laceration Location:  Head/neck Head/neck laceration location:  L ear Length (cm):  Stellate lac crossing border of posterior left ear and scalp. Quality: jagged   Bleeding: controlled   Time since incident:  2 hours Tetanus status:  Unknown   Past Medical History  Diagnosis Date  . Hypothyroidism   . Hypertension   . Shortness of breath   . COPD (chronic obstructive pulmonary disease)   . Osteoporosis   . Cancer    Past Surgical History  Procedure Laterality Date  . Abdominal hysterectomy    . Total knee arthroplasty  2010    left knee  . Mastectomy  1980    left side  . Tonsillectomy    . Exploratory laparotomy w/ bowel resection      10/19/12-11/01/12 [facility] for exploratory laparotomty with extensive lysis of adhesion and segmental small bowel resection with end to end anastomosis per Dr. Isidore Moos 10/22/12   No family history on file. History  Substance Use Topics  . Smoking status: Former Smoker -- 1.00 packs/day for 45 years    Quit date: 01/26/1989  . Smokeless tobacco: Not on file  . Alcohol Use: 0.6 oz/week    1 Glasses of wine per week     Comment: daily   OB History   Grav Para Term Preterm Abortions TAB SAB Ect Mult Living                 Review of Systems  Constitutional: Negative for fever.  Gastrointestinal: Negative for nausea.  Musculoskeletal: Negative for neck pain.  Skin: Positive for wound.  Neurological: Negative for headaches.    Allergies  Review of patient's allergies indicates no known allergies.  Home Medications    Current Outpatient Rx  Name  Route  Sig  Dispense  Refill  . aspirin 81 MG chewable tablet   Oral   Chew 81 mg by mouth every morning.         . carvedilol (COREG) 12.5 MG tablet   Oral   Take 12.5 mg by mouth 2 (two) times daily with a meal.         . cloNIDine (CATAPRES) 0.1 MG tablet   Oral   Take 0.1 mg by mouth every morning.          . diltiazem (DILACOR XR) 120 MG 24 hr capsule   Oral   Take 120 mg by mouth every morning.         . docusate sodium (COLACE) 100 MG capsule   Oral   Take 100 mg by mouth every morning.         . feeding supplement, RESOURCE BREEZE, (RESOURCE BREEZE) LIQD   Oral   Take 1 Container by mouth 3 (three) times daily with meals.         . furosemide (LASIX) 20 MG tablet   Oral   Take 10 mg by mouth every morning.         Marland Kitchen levothyroxine (SYNTHROID, LEVOTHROID) 50 MCG tablet   Oral   Take 50 mcg by mouth every morning.          Marland Kitchen  lisinopril (PRINIVIL,ZESTRIL) 40 MG tablet   Oral   Take 40 mg by mouth every morning.         . Melatonin 3 MG TBDP   Oral   Take 3 mg by mouth at bedtime.         . Multiple Vitamin (MULTIVITAMIN WITH MINERALS) TABS tablet   Oral   Take 1 tablet by mouth every morning.         . polyethylene glycol (MIRALAX / GLYCOLAX) packet   Oral   Take 17 g by mouth every evening.          . sertraline (ZOLOFT) 50 MG tablet   Oral   Take 50 mg by mouth at bedtime.         Marland Kitchen tiotropium (SPIRIVA) 18 MCG inhalation capsule   Inhalation   Place 18 mcg into inhaler and inhale daily.         Marland Kitchen zolpidem (AMBIEN) 5 MG tablet   Oral   Take 2.5-5 mg by mouth at bedtime as needed for sleep.          BP 199/83  Pulse 94  Temp(Src) 97.9 F (36.6 C) (Oral)  Resp 18  SpO2 94% Physical Exam  Constitutional: She is oriented to person, place, and time. She appears well-developed and well-nourished. No distress.  Pulmonary/Chest: Effort normal.  Neurological: She is oriented to person,  place, and time.  Skin:  Stellate lac posterior left ear    ED Course  Procedures (including critical care time) Labs Review Labs Reviewed - No data to display Imaging Review No results found.  EKG Interpretation   None     LACERATION REPAIR Performed by: Charlann Lange A Authorized by: Charlann Lange A Consent: Verbal consent obtained. Risks and benefits: risks, benefits and alternatives were discussed Consent given by: patient Patient identity confirmed: provided demographic data Prepped and Draped in normal sterile fashion Wound explored  Laceration Location: posterior left ear  Laceration Length: 2cm  No Foreign Bodies seen or palpated  Anesthesia: local infiltration  Local anesthetic: lidocaine 2% w/o epinephrine  Anesthetic total: 1 ml  Irrigation method: syringe Amount of cleaning: standard  Skin closure: 5-0 Prolene  Number of sutures: 3  Technique: simple interrupted  Patient tolerance: Patient tolerated the procedure well with no immediate complications.   MDM  No diagnosis found. 1. Laceration left ear  No LOC, post-event nausea, headache or neck pain. Simple laceration to posterior left ear, repaired.     Dewaine Oats, PA-C 04/08/13 2318  Dewaine Oats, PA-C 04/08/13 2319

## 2013-04-08 NOTE — ED Notes (Signed)
Bed: WA03 Expected date:  Expected time:  Means of arrival:  Comments: EMS 80yo F, fall laceration to behind ear

## 2013-04-08 NOTE — Discharge Instructions (Signed)
Tetanus and Diphtheria Vaccine Your caregiver has suggested that you receive an immunization to prevent tetanus (lockjaw) and diphtheria. Tetanus and diphtheria are serious and deadly infectious diseases of the past that have been nearly wiped out by modern immunizations. Td or DT vaccines (shots) are the immunizations given to help prevent these illnesses. Td is the medical term for a standard tetanus dose, small diphtheria dose. DT means both in standard doses. ABOUT THE DISEASES Tetanus (lockjaw) and diphtheria are serious diseases. Tetanus is caused by a germ that lives in the soil. It enters the body through a cut or wound, often caused by a nail or broken piece of glass. You cannot catch tetanus from another person. Diphtheria spreads when germs pass from an infected person to the nose or throat of others. Tetanus causes serious, painful spasms of all muscles. It can lead to:  "Locking" of the muscles of the jaw and throat, so the patient cannot open his or her mouth or swallow.  Damage to the heart muscle. Diphtheria causes a thick coating in the nose, throat, or airway. It can lead to:  Breathing problems.  Kidney problems.  Heart failure.  Paralysis.  Death. ABOUT THE VACCINES  A vaccine is a shot (immunization) that can help prevent a disease. Vaccines have helped lower the rates of getting certain diseases. If people stopped getting vaccinated, more people would develop illnesses. These vaccines can be used in three ways:  As catch-up for people who did not get all their doses when they were children.  As a booster dose every 10 years.  For protection against tetanus infection, after a wound. Benefits of the vaccines Vaccination is the best way to protect against tetanus and diphtheria. Because of vaccination, there are fewer cases of these diseases. Cases are rare in children because most get a routine vaccination with DTP (Diphtheria, Tetanus, and Pertussis), DTaP  (Diphtheria, Tetanus, and acellular Pertussis), or DT (Diphtheria and Tetanus) vaccines. There would be many more cases if we stopped vaccinating people. Tetanus kills about 1 in 5 people who are infected. WHEN SHOULD YOU GET TD VACCINE?  Td is made for people 62 years of age and older.  People who have not gotten at least 3 doses of any tetanus and diphtheria vaccine (DTP, DTaP or DT) during their lifetime should do so using Td. After a person gets the third dose, a Td dose is needed every 10 years all through life. This is because protection fades over time. Booster shots are needed every 10 years.  Other vaccines may be given at the same time as Td. You may not know today whether your immunizations are current. The vaccine given today is to protect you from your next cut or injury. It does not offer protection for the current injury. An immune globulin injection may be given, if protection is needed immediately. Check with your caregiver later regarding your immunization status. Tell your caregiver if the person getting the vaccine:  Has ever had a serious allergic reaction or other problem with Td, or any other tetanus and diphtheria vaccine (DTP, DTaP, or DT). People who have had a serious allergic reaction should not receive the vaccine.  Has epilepsy or another nervous system illness.  Has had Guillain Barre Syndrome (GBS) in the past.  Now has a moderate or severe illness.  Is pregnant.  If you are not sure, ask your caregiver. WHAT ARE THE RISKS FROM TD VACCINE?  As with any medicine, there are very small  risks that serious problems, even death, could occur after getting a vaccine. However, the risk of a serious side effect from the vaccine is almost zero.  The risks from the vaccine are much smaller than the risks from the diseases, if people stopped getting vaccinated. Both diseases can cause serious health problems, which are prevented by the vaccine.  Almost all people who get  Td have no problems from it. Mild problems If mild problems occur, they usually start within hours to a day or two after vaccination. They may last 1-2 days:  Soreness, redness, or swelling where the shot was given.  Headache or tiredness.  Occasionally, a low grade fever. These problems can be worse in adults who get Td vaccine very often. Non-aspirin medicines may be used to reduce soreness. Severe problems These problems happen very rarely:  Serious allergic reaction (at most, occurs in 1 in 1 million vaccinated persons). This occurs almost immediately, and is treatable with medicines. Signs of a serious allergic reaction include:  Difficulty breathing.  Hoarseness or wheezing.  Hives.  Dizziness.  Deep, aching pain and muscle wasting in upper arm(s). Overall, the benefits to you and your family from these vaccines are far greater than the risk. WHAT TO DO IF THERE IS A SERIOUS REACTION:  Call a caregiver or get the person to a doctor or emergency room right away.  Write down what happened, the date and time it happened, and tell your caregiver.  Ask your caregiver to file a Vaccine Adverse Event Report form or call, toll-free: [phone] If you want to learn more about this vaccine, ask your caregiver. She/he can give you the vaccine package insert or suggest other sources of information. Also, the [employer] gives compensation (payment) for persons thought to be injured by vaccines. For details call, toll-free: (800) 937-606-9633. Document Released: 03/06/2000 Document Revised: 06/01/2011 Document Reviewed: 01/24/2009 [facility] Patient Information 2014 [place], [place]. Sutured Wound Care Sutures are stitches that can be used to close wounds. Wound care helps prevent pain and infection.  HOME CARE INSTRUCTIONS   Rest and elevate the injured area until all the pain and swelling are gone.  Only take over-the-counter or prescription medicines  for pain, discomfort, or fever as directed by your caregiver.  After 48 hours, gently wash the area with mild soap and water once a day, or as directed. Rinse off the soap. Pat the area dry with a clean towel. Do not rub the wound. This may cause bleeding.  Follow your caregiver's instructions for how often to change the bandage (dressing). Stop using a dressing after 2 days or after the wound stops draining.  If the dressing sticks, moisten it with soapy water and gently remove it.  Apply ointment on the wound as directed.  Avoid stretching a sutured wound.  Drink enough fluids to keep your urine clear or pale yellow.  Follow up with your caregiver for suture removal as directed.  Use sunscreen on your wound for the next 3 to 6 months so the scar will not darken. SEEK IMMEDIATE MEDICAL CARE IF:   Your wound becomes red, swollen, hot, or tender.  You have increasing pain in the wound.  You have a red streak that extends from the wound.  There is pus coming from the wound.  You have a fever.  You have shaking chills.  There is a bad smell coming from the wound.  You have persistent bleeding from the wound. MAKE SURE YOU:  Understand these instructions.  Will watch your condition.  Will get help right away if you are not doing well or get worse. Document Released: 04/16/2004 Document Revised: 06/01/2011 Document Reviewed: 07/13/2010 [facility] Patient Information 2014 [place], [place].

## 2013-04-09 DIAGNOSIS — R51 Headache: Secondary | ICD-10-CM | POA: Diagnosis not present

## 2013-04-09 DIAGNOSIS — IMO0002 Reserved for concepts with insufficient information to code with codable children: Secondary | ICD-10-CM | POA: Diagnosis not present

## 2013-04-09 NOTE — ED Notes (Signed)
[facility] called  

## 2013-04-10 NOTE — ED Provider Notes (Signed)
80 y.o. Female reports that she rolled out of bed and tore skin on something during fall.  She denies loc.  She reports no weakness, headache, or blood thinner use. Patient ambulated here with assistance (she normally walks with walker) and she self reports normal gait for her.  I performed a history and physical examination of DARREN NODAL and discussed her management with Ms. Upstill.  I agree with the history, physical, assessment, and plan of care, with the following exceptions: None  I was present for the following procedures: None Time Spent in Critical Care of the patient: None Time spent in discussions with the patient and family: 7    Kenae Lindquist S    Shaune Pollack, MD 04/10/13 1234

## 2013-04-28 DIAGNOSIS — M949 Disorder of cartilage, unspecified: Secondary | ICD-10-CM | POA: Diagnosis not present

## 2013-04-28 DIAGNOSIS — E559 Vitamin D deficiency, unspecified: Secondary | ICD-10-CM | POA: Diagnosis not present

## 2013-04-28 DIAGNOSIS — E039 Hypothyroidism, unspecified: Secondary | ICD-10-CM | POA: Diagnosis not present

## 2013-04-28 DIAGNOSIS — M899 Disorder of bone, unspecified: Secondary | ICD-10-CM | POA: Diagnosis not present

## 2013-04-28 DIAGNOSIS — E785 Hyperlipidemia, unspecified: Secondary | ICD-10-CM | POA: Diagnosis not present

## 2013-04-28 DIAGNOSIS — R7309 Other abnormal glucose: Secondary | ICD-10-CM | POA: Diagnosis not present

## 2013-04-28 DIAGNOSIS — I10 Essential (primary) hypertension: Secondary | ICD-10-CM | POA: Diagnosis not present

## 2013-05-03 DIAGNOSIS — R1312 Dysphagia, oropharyngeal phase: Secondary | ICD-10-CM | POA: Diagnosis not present

## 2013-05-05 DIAGNOSIS — R627 Adult failure to thrive: Secondary | ICD-10-CM | POA: Diagnosis not present

## 2013-05-05 DIAGNOSIS — R5381 Other malaise: Secondary | ICD-10-CM | POA: Diagnosis not present

## 2013-05-05 DIAGNOSIS — Z0000 Encounter for general adult medical examination without abnormal findings: Secondary | ICD-10-CM | POA: Diagnosis not present

## 2013-05-05 DIAGNOSIS — Z1331 Encounter for screening for depression: Secondary | ICD-10-CM | POA: Diagnosis not present

## 2013-05-05 DIAGNOSIS — R0602 Shortness of breath: Secondary | ICD-10-CM | POA: Diagnosis not present

## 2013-05-05 DIAGNOSIS — K5900 Constipation, unspecified: Secondary | ICD-10-CM | POA: Diagnosis not present

## 2013-05-05 DIAGNOSIS — R32 Unspecified urinary incontinence: Secondary | ICD-10-CM | POA: Diagnosis not present

## 2013-05-05 DIAGNOSIS — Z6828 Body mass index (BMI) 28.0-28.9, adult: Secondary | ICD-10-CM | POA: Diagnosis not present

## 2013-05-05 DIAGNOSIS — I6990 Unspecified sequelae of unspecified cerebrovascular disease: Secondary | ICD-10-CM | POA: Diagnosis not present

## 2013-05-05 DIAGNOSIS — G4700 Insomnia, unspecified: Secondary | ICD-10-CM | POA: Diagnosis not present

## 2013-05-08 DIAGNOSIS — R1312 Dysphagia, oropharyngeal phase: Secondary | ICD-10-CM | POA: Diagnosis not present

## 2013-05-09 DIAGNOSIS — Z1212 Encounter for screening for malignant neoplasm of rectum: Secondary | ICD-10-CM | POA: Diagnosis not present

## 2013-05-22 DIAGNOSIS — R1312 Dysphagia, oropharyngeal phase: Secondary | ICD-10-CM | POA: Diagnosis not present

## 2013-06-07 DIAGNOSIS — J449 Chronic obstructive pulmonary disease, unspecified: Secondary | ICD-10-CM | POA: Diagnosis not present

## 2013-06-07 DIAGNOSIS — Z6828 Body mass index (BMI) 28.0-28.9, adult: Secondary | ICD-10-CM | POA: Diagnosis not present

## 2013-06-07 DIAGNOSIS — F341 Dysthymic disorder: Secondary | ICD-10-CM | POA: Diagnosis not present

## 2013-06-07 DIAGNOSIS — I10 Essential (primary) hypertension: Secondary | ICD-10-CM | POA: Diagnosis not present

## 2013-06-21 DIAGNOSIS — J449 Chronic obstructive pulmonary disease, unspecified: Secondary | ICD-10-CM | POA: Diagnosis not present

## 2013-06-21 DIAGNOSIS — I10 Essential (primary) hypertension: Secondary | ICD-10-CM | POA: Diagnosis not present

## 2013-06-21 DIAGNOSIS — J441 Chronic obstructive pulmonary disease with (acute) exacerbation: Secondary | ICD-10-CM | POA: Diagnosis not present

## 2013-06-26 DIAGNOSIS — R41841 Cognitive communication deficit: Secondary | ICD-10-CM | POA: Diagnosis not present

## 2013-06-26 DIAGNOSIS — R279 Unspecified lack of coordination: Secondary | ICD-10-CM | POA: Diagnosis not present

## 2013-06-26 DIAGNOSIS — M6281 Muscle weakness (generalized): Secondary | ICD-10-CM | POA: Diagnosis not present

## 2013-06-27 DIAGNOSIS — R279 Unspecified lack of coordination: Secondary | ICD-10-CM | POA: Diagnosis not present

## 2013-06-27 DIAGNOSIS — R41841 Cognitive communication deficit: Secondary | ICD-10-CM | POA: Diagnosis not present

## 2013-06-27 DIAGNOSIS — M6281 Muscle weakness (generalized): Secondary | ICD-10-CM | POA: Diagnosis not present

## 2013-07-03 DIAGNOSIS — M6281 Muscle weakness (generalized): Secondary | ICD-10-CM | POA: Diagnosis not present

## 2013-07-03 DIAGNOSIS — R41841 Cognitive communication deficit: Secondary | ICD-10-CM | POA: Diagnosis not present

## 2013-07-03 DIAGNOSIS — R279 Unspecified lack of coordination: Secondary | ICD-10-CM | POA: Diagnosis not present

## 2013-07-05 DIAGNOSIS — R279 Unspecified lack of coordination: Secondary | ICD-10-CM | POA: Diagnosis not present

## 2013-07-05 DIAGNOSIS — M6281 Muscle weakness (generalized): Secondary | ICD-10-CM | POA: Diagnosis not present

## 2013-07-05 DIAGNOSIS — R41841 Cognitive communication deficit: Secondary | ICD-10-CM | POA: Diagnosis not present

## 2013-07-06 DIAGNOSIS — R5383 Other fatigue: Secondary | ICD-10-CM | POA: Diagnosis not present

## 2013-07-06 DIAGNOSIS — E039 Hypothyroidism, unspecified: Secondary | ICD-10-CM | POA: Diagnosis not present

## 2013-07-06 DIAGNOSIS — R627 Adult failure to thrive: Secondary | ICD-10-CM | POA: Diagnosis not present

## 2013-07-06 DIAGNOSIS — R5381 Other malaise: Secondary | ICD-10-CM | POA: Diagnosis not present

## 2013-07-06 DIAGNOSIS — I6990 Unspecified sequelae of unspecified cerebrovascular disease: Secondary | ICD-10-CM | POA: Diagnosis not present

## 2013-07-06 DIAGNOSIS — K5900 Constipation, unspecified: Secondary | ICD-10-CM | POA: Diagnosis not present

## 2013-07-06 DIAGNOSIS — E785 Hyperlipidemia, unspecified: Secondary | ICD-10-CM | POA: Diagnosis not present

## 2013-07-06 DIAGNOSIS — I10 Essential (primary) hypertension: Secondary | ICD-10-CM | POA: Diagnosis not present

## 2013-07-06 DIAGNOSIS — R32 Unspecified urinary incontinence: Secondary | ICD-10-CM | POA: Diagnosis not present

## 2013-07-06 DIAGNOSIS — R41841 Cognitive communication deficit: Secondary | ICD-10-CM | POA: Diagnosis not present

## 2013-07-06 DIAGNOSIS — R279 Unspecified lack of coordination: Secondary | ICD-10-CM | POA: Diagnosis not present

## 2013-07-06 DIAGNOSIS — M6281 Muscle weakness (generalized): Secondary | ICD-10-CM | POA: Diagnosis not present

## 2013-07-10 DIAGNOSIS — R41841 Cognitive communication deficit: Secondary | ICD-10-CM | POA: Diagnosis not present

## 2013-07-10 DIAGNOSIS — M6281 Muscle weakness (generalized): Secondary | ICD-10-CM | POA: Diagnosis not present

## 2013-07-10 DIAGNOSIS — R279 Unspecified lack of coordination: Secondary | ICD-10-CM | POA: Diagnosis not present

## 2013-07-13 DIAGNOSIS — M6281 Muscle weakness (generalized): Secondary | ICD-10-CM | POA: Diagnosis not present

## 2013-07-13 DIAGNOSIS — R279 Unspecified lack of coordination: Secondary | ICD-10-CM | POA: Diagnosis not present

## 2013-07-13 DIAGNOSIS — R41841 Cognitive communication deficit: Secondary | ICD-10-CM | POA: Diagnosis not present

## 2013-07-17 DIAGNOSIS — M6281 Muscle weakness (generalized): Secondary | ICD-10-CM | POA: Diagnosis not present

## 2013-07-17 DIAGNOSIS — R41841 Cognitive communication deficit: Secondary | ICD-10-CM | POA: Diagnosis not present

## 2013-07-17 DIAGNOSIS — R279 Unspecified lack of coordination: Secondary | ICD-10-CM | POA: Diagnosis not present

## 2013-07-19 DIAGNOSIS — R279 Unspecified lack of coordination: Secondary | ICD-10-CM | POA: Diagnosis not present

## 2013-07-19 DIAGNOSIS — M6281 Muscle weakness (generalized): Secondary | ICD-10-CM | POA: Diagnosis not present

## 2013-07-19 DIAGNOSIS — R41841 Cognitive communication deficit: Secondary | ICD-10-CM | POA: Diagnosis not present

## 2013-07-20 DIAGNOSIS — R279 Unspecified lack of coordination: Secondary | ICD-10-CM | POA: Diagnosis not present

## 2013-07-20 DIAGNOSIS — R41841 Cognitive communication deficit: Secondary | ICD-10-CM | POA: Diagnosis not present

## 2013-07-20 DIAGNOSIS — M6281 Muscle weakness (generalized): Secondary | ICD-10-CM | POA: Diagnosis not present

## 2013-07-25 DIAGNOSIS — M6281 Muscle weakness (generalized): Secondary | ICD-10-CM | POA: Diagnosis not present

## 2013-07-25 DIAGNOSIS — R279 Unspecified lack of coordination: Secondary | ICD-10-CM | POA: Diagnosis not present

## 2013-07-28 DIAGNOSIS — R279 Unspecified lack of coordination: Secondary | ICD-10-CM | POA: Diagnosis not present

## 2013-07-28 DIAGNOSIS — M6281 Muscle weakness (generalized): Secondary | ICD-10-CM | POA: Diagnosis not present

## 2013-07-31 DIAGNOSIS — R279 Unspecified lack of coordination: Secondary | ICD-10-CM | POA: Diagnosis not present

## 2013-07-31 DIAGNOSIS — M6281 Muscle weakness (generalized): Secondary | ICD-10-CM | POA: Diagnosis not present

## 2013-08-01 DIAGNOSIS — M6281 Muscle weakness (generalized): Secondary | ICD-10-CM | POA: Diagnosis not present

## 2013-08-01 DIAGNOSIS — R279 Unspecified lack of coordination: Secondary | ICD-10-CM | POA: Diagnosis not present

## 2013-08-02 ENCOUNTER — Ambulatory Visit: Payer: Medicare Other | Admitting: Podiatry

## 2013-08-02 ENCOUNTER — Encounter: Payer: Self-pay | Admitting: Podiatry

## 2013-08-02 VITALS — BP 141/66 | HR 66 | Resp 12

## 2013-08-02 DIAGNOSIS — M2040 Other hammer toe(s) (acquired), unspecified foot: Secondary | ICD-10-CM | POA: Diagnosis not present

## 2013-08-02 DIAGNOSIS — M2010 Hallux valgus (acquired), unspecified foot: Secondary | ICD-10-CM

## 2013-08-02 DIAGNOSIS — L84 Corns and callosities: Secondary | ICD-10-CM

## 2013-08-02 NOTE — Progress Notes (Signed)
   Subjective:    Patient ID: Dana George, female    DOB: 15-Dec-1923, 80 y.o.   MRN: [id number]  HPI PT STATED RT FOOT INSIDE OF THE  2ND TOE IS SORE FOR 1-2 MONTHS. THE TOE IS BEEN THE SAME, NOT WORSE. THE TOE GET AGGRAVATED BY PRESSURE AND TRIED NO TREATMENT.    Review of Systems  Gastrointestinal: Positive for constipation.  Musculoskeletal: Positive for gait problem.       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  Reappoint at patient's request

## 2013-08-02 NOTE — Patient Instructions (Signed)
Place the toe wedge between the right first toe and right second toe daily when wearing shoes until the second right toe feels comfortable.

## 2013-08-03 ENCOUNTER — Encounter: Payer: Self-pay | Admitting: Podiatry

## 2013-08-03 DIAGNOSIS — R279 Unspecified lack of coordination: Secondary | ICD-10-CM | POA: Diagnosis not present

## 2013-08-03 DIAGNOSIS — M6281 Muscle weakness (generalized): Secondary | ICD-10-CM | POA: Diagnosis not present

## 2013-08-07 DIAGNOSIS — R5381 Other malaise: Secondary | ICD-10-CM | POA: Diagnosis not present

## 2013-08-07 DIAGNOSIS — J449 Chronic obstructive pulmonary disease, unspecified: Secondary | ICD-10-CM | POA: Diagnosis not present

## 2013-08-07 DIAGNOSIS — I10 Essential (primary) hypertension: Secondary | ICD-10-CM | POA: Diagnosis not present

## 2013-08-07 DIAGNOSIS — G4700 Insomnia, unspecified: Secondary | ICD-10-CM | POA: Diagnosis not present

## 2013-08-07 DIAGNOSIS — R5383 Other fatigue: Secondary | ICD-10-CM | POA: Diagnosis not present

## 2013-08-07 DIAGNOSIS — Z6826 Body mass index (BMI) 26.0-26.9, adult: Secondary | ICD-10-CM | POA: Diagnosis not present

## 2013-08-08 DIAGNOSIS — R279 Unspecified lack of coordination: Secondary | ICD-10-CM | POA: Diagnosis not present

## 2013-08-08 DIAGNOSIS — M6281 Muscle weakness (generalized): Secondary | ICD-10-CM | POA: Diagnosis not present

## 2013-08-10 DIAGNOSIS — M6281 Muscle weakness (generalized): Secondary | ICD-10-CM | POA: Diagnosis not present

## 2013-08-10 DIAGNOSIS — R279 Unspecified lack of coordination: Secondary | ICD-10-CM | POA: Diagnosis not present

## 2013-08-11 DIAGNOSIS — R279 Unspecified lack of coordination: Secondary | ICD-10-CM | POA: Diagnosis not present

## 2013-08-11 DIAGNOSIS — M6281 Muscle weakness (generalized): Secondary | ICD-10-CM | POA: Diagnosis not present

## 2013-08-15 DIAGNOSIS — R279 Unspecified lack of coordination: Secondary | ICD-10-CM | POA: Diagnosis not present

## 2013-08-15 DIAGNOSIS — M6281 Muscle weakness (generalized): Secondary | ICD-10-CM | POA: Diagnosis not present

## 2013-08-17 DIAGNOSIS — R279 Unspecified lack of coordination: Secondary | ICD-10-CM | POA: Diagnosis not present

## 2013-08-17 DIAGNOSIS — M6281 Muscle weakness (generalized): Secondary | ICD-10-CM | POA: Diagnosis not present

## 2013-08-22 DIAGNOSIS — M6281 Muscle weakness (generalized): Secondary | ICD-10-CM | POA: Diagnosis not present

## 2013-08-22 DIAGNOSIS — R279 Unspecified lack of coordination: Secondary | ICD-10-CM | POA: Diagnosis not present

## 2013-08-25 DIAGNOSIS — R279 Unspecified lack of coordination: Secondary | ICD-10-CM | POA: Diagnosis not present

## 2013-08-25 DIAGNOSIS — M6281 Muscle weakness (generalized): Secondary | ICD-10-CM | POA: Diagnosis not present

## 2013-09-04 DIAGNOSIS — E039 Hypothyroidism, unspecified: Secondary | ICD-10-CM | POA: Diagnosis not present

## 2013-09-04 DIAGNOSIS — R5381 Other malaise: Secondary | ICD-10-CM | POA: Diagnosis not present

## 2013-09-04 DIAGNOSIS — J449 Chronic obstructive pulmonary disease, unspecified: Secondary | ICD-10-CM | POA: Diagnosis not present

## 2013-09-04 DIAGNOSIS — G4700 Insomnia, unspecified: Secondary | ICD-10-CM | POA: Diagnosis not present

## 2013-09-04 DIAGNOSIS — I10 Essential (primary) hypertension: Secondary | ICD-10-CM | POA: Diagnosis not present

## 2013-09-04 DIAGNOSIS — R5383 Other fatigue: Secondary | ICD-10-CM | POA: Diagnosis not present

## 2013-10-19 DIAGNOSIS — E785 Hyperlipidemia, unspecified: Secondary | ICD-10-CM | POA: Diagnosis not present

## 2013-10-19 DIAGNOSIS — E039 Hypothyroidism, unspecified: Secondary | ICD-10-CM | POA: Diagnosis not present

## 2013-10-19 DIAGNOSIS — R0602 Shortness of breath: Secondary | ICD-10-CM | POA: Diagnosis not present

## 2013-10-19 DIAGNOSIS — Z6826 Body mass index (BMI) 26.0-26.9, adult: Secondary | ICD-10-CM | POA: Diagnosis not present

## 2013-10-19 DIAGNOSIS — J449 Chronic obstructive pulmonary disease, unspecified: Secondary | ICD-10-CM | POA: Diagnosis not present

## 2013-10-19 DIAGNOSIS — I6990 Unspecified sequelae of unspecified cerebrovascular disease: Secondary | ICD-10-CM | POA: Diagnosis not present

## 2013-10-19 DIAGNOSIS — F341 Dysthymic disorder: Secondary | ICD-10-CM | POA: Diagnosis not present

## 2013-10-19 DIAGNOSIS — I10 Essential (primary) hypertension: Secondary | ICD-10-CM | POA: Diagnosis not present

## 2013-11-07 DIAGNOSIS — K5900 Constipation, unspecified: Secondary | ICD-10-CM | POA: Diagnosis not present

## 2013-11-07 DIAGNOSIS — Z6826 Body mass index (BMI) 26.0-26.9, adult: Secondary | ICD-10-CM | POA: Diagnosis not present

## 2013-11-07 DIAGNOSIS — R109 Unspecified abdominal pain: Secondary | ICD-10-CM | POA: Diagnosis not present

## 2013-12-09 ENCOUNTER — Emergency Department (HOSPITAL_COMMUNITY)
Admission: EM | Admit: 2013-12-09 | Discharge: 2013-12-09 | Disposition: A | Discharge status: Home / Self Care | Payer: Medicare Other | Attending: Emergency Medicine | Admitting: Emergency Medicine

## 2013-12-09 ENCOUNTER — Emergency Department (HOSPITAL_COMMUNITY): Payer: Medicare Other

## 2013-12-09 ENCOUNTER — Encounter (HOSPITAL_COMMUNITY): Payer: Self-pay | Admitting: Emergency Medicine

## 2013-12-09 DIAGNOSIS — J449 Chronic obstructive pulmonary disease, unspecified: Secondary | ICD-10-CM | POA: Insufficient documentation

## 2013-12-09 DIAGNOSIS — I10 Essential (primary) hypertension: Secondary | ICD-10-CM | POA: Diagnosis not present

## 2013-12-09 DIAGNOSIS — W1809XA Striking against other object with subsequent fall, initial encounter: Secondary | ICD-10-CM | POA: Insufficient documentation

## 2013-12-09 DIAGNOSIS — Z7982 Long term (current) use of aspirin: Secondary | ICD-10-CM | POA: Insufficient documentation

## 2013-12-09 DIAGNOSIS — Y9389 Activity, other specified: Secondary | ICD-10-CM | POA: Diagnosis not present

## 2013-12-09 DIAGNOSIS — Z79899 Other long term (current) drug therapy: Secondary | ICD-10-CM | POA: Insufficient documentation

## 2013-12-09 DIAGNOSIS — Z8739 Personal history of other diseases of the musculoskeletal system and connective tissue: Secondary | ICD-10-CM | POA: Insufficient documentation

## 2013-12-09 DIAGNOSIS — S298XXA Other specified injuries of thorax, initial encounter: Secondary | ICD-10-CM | POA: Diagnosis not present

## 2013-12-09 DIAGNOSIS — Z87891 Personal history of nicotine dependence: Secondary | ICD-10-CM | POA: Insufficient documentation

## 2013-12-09 DIAGNOSIS — S20219A Contusion of unspecified front wall of thorax, initial encounter: Secondary | ICD-10-CM | POA: Diagnosis not present

## 2013-12-09 DIAGNOSIS — W2203XA Walked into furniture, initial encounter: Secondary | ICD-10-CM | POA: Diagnosis not present

## 2013-12-09 DIAGNOSIS — J4489 Other specified chronic obstructive pulmonary disease: Secondary | ICD-10-CM | POA: Insufficient documentation

## 2013-12-09 DIAGNOSIS — IMO0002 Reserved for concepts with insufficient information to code with codable children: Secondary | ICD-10-CM | POA: Diagnosis not present

## 2013-12-09 DIAGNOSIS — L089 Local infection of the skin and subcutaneous tissue, unspecified: Secondary | ICD-10-CM | POA: Diagnosis not present

## 2013-12-09 DIAGNOSIS — T148XXA Other injury of unspecified body region, initial encounter: Secondary | ICD-10-CM | POA: Diagnosis not present

## 2013-12-09 DIAGNOSIS — R079 Chest pain, unspecified: Secondary | ICD-10-CM | POA: Diagnosis not present

## 2013-12-09 DIAGNOSIS — Y9289 Other specified places as the place of occurrence of the external cause: Secondary | ICD-10-CM | POA: Diagnosis not present

## 2013-12-09 DIAGNOSIS — Z859 Personal history of malignant neoplasm, unspecified: Secondary | ICD-10-CM | POA: Insufficient documentation

## 2013-12-09 DIAGNOSIS — E039 Hypothyroidism, unspecified: Secondary | ICD-10-CM | POA: Insufficient documentation

## 2013-12-09 LAB — CBC WITH DIFFERENTIAL/PLATELET
BASOS PCT: 0 % (ref 0–1)
Basophils Absolute: 0 10*3/uL (ref 0.0–0.1)
Eosinophils Absolute: 0.1 10*3/uL (ref 0.0–0.7)
Eosinophils Relative: 1 % (ref 0–5)
HEMATOCRIT: 47.8 % — AB (ref 36.0–46.0)
HEMOGLOBIN: 15.9 g/dL — AB (ref 12.0–15.0)
LYMPHS ABS: 1 10*3/uL (ref 0.7–4.0)
Lymphocytes Relative: 11 % — ABNORMAL LOW (ref 12–46)
MCH: 32.4 pg (ref 26.0–34.0)
MCHC: 33.3 g/dL (ref 30.0–36.0)
MCV: 97.4 fL (ref 78.0–100.0)
MONO ABS: 0.9 10*3/uL (ref 0.1–1.0)
MONOS PCT: 10 % (ref 3–12)
NEUTROS ABS: 7.1 10*3/uL (ref 1.7–7.7)
Neutrophils Relative %: 78 % — ABNORMAL HIGH (ref 43–77)
Platelets: 390 10*3/uL (ref 150–400)
RBC: 4.91 MIL/uL (ref 3.87–5.11)
RDW: 16.2 % — ABNORMAL HIGH (ref 11.5–15.5)
WBC: 9.1 10*3/uL (ref 4.0–10.5)

## 2013-12-09 LAB — URINALYSIS, ROUTINE W REFLEX MICROSCOPIC
Bilirubin Urine: NEGATIVE
Glucose, UA: NEGATIVE mg/dL
Hgb urine dipstick: NEGATIVE
Ketones, ur: NEGATIVE mg/dL
Leukocytes, UA: NEGATIVE
Nitrite: NEGATIVE
Protein, ur: NEGATIVE mg/dL
Specific Gravity, Urine: 1.011 (ref 1.005–1.030)
Urobilinogen, UA: 0.2 mg/dL (ref 0.0–1.0)
pH: 7 (ref 5.0–8.0)

## 2013-12-09 LAB — BASIC METABOLIC PANEL
Anion gap: 13 (ref 5–15)
BUN: 20 mg/dL (ref 6–23)
CHLORIDE: 101 meq/L (ref 96–112)
CO2: 27 mEq/L (ref 19–32)
CREATININE: 0.64 mg/dL (ref 0.50–1.10)
Calcium: 9.6 mg/dL (ref 8.4–10.5)
GFR calc non Af Amer: 77 mL/min — ABNORMAL LOW (ref >90)
GFR, EST AFRICAN AMERICAN: 90 mL/min — AB (ref >90)
GLUCOSE: 117 mg/dL — AB (ref 70–99)
POTASSIUM: 3.9 meq/L (ref 3.7–5.3)
Sodium: 141 mEq/L (ref 137–147)

## 2013-12-09 MED ORDER — MORPHINE SULFATE 2 MG/ML IJ SOLN
2.0000 mg | INTRAMUSCULAR | Status: AC | PRN
  Administered 2013-12-09 (×2): 2 mg via INTRAVENOUS
  Filled 2013-12-09 (×2): qty 1

## 2013-12-09 MED ORDER — HYDROCODONE-ACETAMINOPHEN 5-325 MG PO TABS: 1.0000 | ORAL_TABLET | ORAL | Status: DC | PRN

## 2013-12-09 MED ORDER — DOCUSATE SODIUM 100 MG PO CAPS: 100.0000 mg | ORAL_CAPSULE | Freq: Two times a day (BID) | ORAL | Status: DC

## 2013-12-09 NOTE — ED Provider Notes (Signed)
CSN: [id number]     Arrival date & time 12/09/13  0441 History   First MD Initiated Contact with Patient 12/09/13 [phone]     Chief Complaint  Patient presents with  . Back Pain    s/p fall    Patient is a 80 y.o. female presenting with back pain.  Back Pain  Pt was getting up to go to the bathroom from her bed.  She usually uses a walk to help her walk. When trying to get up she fell and struck her head against a dresser.  She also hit her back on the ground.  No LOC.  She now has pain in her lower back on both sides but worse on the right.  Sharp pain that increases with movement.  No numbness or weakness.  No change in her breathing.  Sometimes she does get short of breath but that is not new for her.  She has had that before the fall.  She felt fine before going to bed last night.  No vomiting or diarrhea.  No fevers.  No chest pain.  She was able to walk a little bit after the fall.  She has had trouble with her back in the past off and on but not chronically. Past Medical History  Diagnosis Date  . Hypothyroidism   . Hypertension   . Shortness of breath   . COPD (chronic obstructive pulmonary disease)   . Osteoporosis   . Cancer    Past Surgical History  Procedure Laterality Date  . Abdominal hysterectomy    . Total knee arthroplasty  2010    left knee  . Mastectomy  1980    left side  . Tonsillectomy    . Exploratory laparotomy w/ bowel resection      10/19/12-11/01/12 [facility] for exploratory laparotomty with extensive lysis of adhesion and segmental small bowel resection with end to end anastomosis per Dr. Isidore Moos 10/22/12   No family history on file. History  Substance Use Topics  . Smoking status: Former Smoker -- 1.00 packs/day for 45 years    Quit date: 01/26/1989  . Smokeless tobacco: Not on file  . Alcohol Use: 0.6 oz/week    1 Glasses of wine per week     Comment: daily   OB History   Grav Para Term Preterm Abortions TAB SAB Ect Mult Living                 Review of Systems  Musculoskeletal: Positive for back pain.  All other systems reviewed and are negative.     Allergies  Review of patient's allergies indicates no known allergies.  Home Medications   Prior to Admission medications   Medication Sig Start Date End Date Taking? Authorizing Provider  aspirin 81 MG chewable tablet Chew 81 mg by mouth every morning.   Yes Historical Provider, MD  carvedilol (COREG) 12.5 MG tablet Take 12.5 mg by mouth 2 (two) times daily with a meal.   Yes Historical Provider, MD  cloNIDine (CATAPRES) 0.1 MG tablet Take 0.1 mg by mouth every morning.    Yes Historical Provider, MD  diltiazem (CARDIZEM CD) 120 MG 24 hr capsule Take 120 mg by mouth daily.  07/11/13  Yes Historical Provider, MD  diltiazem (DILACOR XR) 120 MG 24 hr capsule Take 120 mg by mouth every morning.   Yes Historical Provider, MD  furosemide (LASIX) 20 MG tablet Take 10 mg by mouth every other day.    Yes Historical Provider,  MD  levothyroxine (SYNTHROID, LEVOTHROID) 50 MCG tablet Take 50 mcg by mouth every morning.    Yes Historical Provider, MD  lisinopril (PRINIVIL,ZESTRIL) 40 MG tablet Take 20 mg by mouth every morning.    Yes Historical Provider, MD  Melatonin 3 MG TBDP Take 3 mg by mouth at bedtime.   Yes Historical Provider, MD  Multiple Vitamin (MULTIVITAMIN WITH MINERALS) TABS tablet Take 1 tablet by mouth every morning.   Yes Historical Provider, MD  sertraline (ZOLOFT) 50 MG tablet Take 50 mg by mouth at bedtime.   Yes Historical Provider, MD  tiotropium (SPIRIVA) 18 MCG inhalation capsule Place 18 mcg into inhaler and inhale daily.   Yes Historical Provider, MD  zolpidem (AMBIEN) 5 MG tablet Take 2.5-5 mg by mouth at bedtime as needed for sleep.   Yes Historical Provider, MD  docusate sodium (COLACE) 100 MG capsule Take 1 capsule (100 mg total) by mouth 2 (two) times daily. 12/09/13   Dorie Rank, MD  HYDROcodone-acetaminophen (NORCO/VICODIN) 5-325 MG per tablet Take 1 tablet  by mouth every 4 (four) hours as needed. 12/09/13   Dorie Rank, MD  polyethylene glycol [facility] / Floria Raveling) packet Take 17 g by mouth daily as needed.     Historical Provider, MD   BP 176/69  Pulse 72  Temp(Src) 98.2 F (36.8 C) (Oral)  Resp 16  SpO2 97% Physical Exam  Nursing note and vitals reviewed. Constitutional: No distress.  Elderly frail  HENT:  Head: Normocephalic and atraumatic.  Right Ear: External ear normal.  Left Ear: External ear normal.  Eyes: Conjunctivae are normal. Right eye exhibits no discharge. Left eye exhibits no discharge. No scleral icterus.  Neck: Neck supple. No tracheal deviation present.  Cardiovascular: Normal rate, regular rhythm and intact distal pulses.   Pulmonary/Chest: Effort normal and breath sounds normal. No stridor. No respiratory distress. She has no wheezes. She has no rales. She exhibits tenderness.  Small abrasion posterior left ribs, ttp bilateral posterior inferior ribs  Abdominal: Soft. Bowel sounds are normal. She exhibits no distension and no mass. There is no tenderness. There is no rebound and no guarding.  Musculoskeletal: She exhibits no edema and no tenderness.  Full rom all 4 extremities without pain or rom, no hip ttp, no pelvis ttp, no ttp midline c t or l spine  Neurological: She is alert. She has normal strength. No cranial nerve deficit (no facial droop, extraocular movements intact, no slurred speech) or sensory deficit. She exhibits normal muscle tone. She displays no seizure activity. Coordination normal.  Skin: Skin is warm and dry. No rash noted.  Psychiatric: She has a normal mood and affect.    ED Course  Procedures (including critical care time) Labs Review Labs Reviewed  CBC WITH DIFFERENTIAL - Abnormal; Notable for the following:    Hemoglobin 15.9 (*)    HCT 47.8 (*)    RDW 16.2 (*)    Neutrophils Relative % 78 (*)    Lymphocytes Relative 11 (*)    All other components within normal limits  BASIC METABOLIC  PANEL - Abnormal; Notable for the following:    Glucose, Bld 117 (*)    GFR calc non Af Amer 77 (*)    GFR calc Af Amer 90 (*)    All other components within normal limits  URINALYSIS, ROUTINE W REFLEX MICROSCOPIC - Abnormal; Notable for the following:    APPearance CLOUDY (*)    All other components within normal limits    Imaging Review  Dg Ribs Bilateral W/chest  12/09/2013   CLINICAL DATA:  Bilateral rib pain following a fall. History of COPD and mastectomy.  EXAM: BILATERAL RIBS AND CHEST - 4+ VIEW  COMPARISON:  12/10/2008.  FINDINGS: Borderline enlarged cardiac silhouette. The lungs remain mildly hyperexpanded with mildly prominent interstitial markings and mild central peribronchial thickening. Old, healed left seventh rib fracture. No acute rib fracture or pneumothorax seen. Postmastectomy changes on the left with left axillary surgical clips. Diffuse osteopenia.  IMPRESSION: 1. No acute rib fracture or pneumothorax. 2. Stable borderline cardiomegaly and mild changes of COPD and chronic bronchitis.   Electronically Signed   By: Enrique Sack M.D.   On: 12/09/2013 08:47    Medications  morphine 2 MG/ML injection 2 mg (2 mg Intravenous Given 12/09/13 1040)    MDM   Final diagnoses:  Rib contusion, unspecified laterality, initial encounter   Pt is still having pain in her ribs but she is more comfortable.  She was able to walk in the ED.  No ataxia.    Will dc home with pain meds.  Discussed precautions.  Follow up with PCP.  [facility] in [facility] facility .  Discussed plan with daughter and pt, comfortable with plan.    Dorie Rank, MD 12/09/13 [phone]

## 2013-12-09 NOTE — ED Notes (Signed)
Pt verbalizes that she is leaving with ALL belongings she arrived with.

## 2013-12-09 NOTE — ED Notes (Signed)
Per EMS, patient from [facility]. Patient attempted to stand from bed, and fell striking her back against the dresser. Patient has been ambulatory at baseline with EMS. Patient initially c/o L sided back pain, has a skin tear to the left side of her back, with deep inspiration patient c/o right sided back pain. Patient denies LOC, denies neck pain.

## 2013-12-09 NOTE — ED Notes (Addendum)
MD at bedside. Patient ambulated in hall with walker. Her gait is steady but daughter says is slower than normal. Patient c/o low back pain that radiated up.

## 2013-12-09 NOTE — Discharge Instructions (Signed)
Blunt Chest Trauma °Blunt chest trauma is an injury caused by a blow to the chest. These chest injuries can be very painful. Blunt chest trauma often results in bruised or broken (fractured) ribs. Most cases of bruised and fractured ribs from blunt chest traumas get better after 1 to 3 weeks of rest and pain medicine. Often, the soft tissue in the chest wall is also injured, causing pain and bruising. Internal organs, such as the heart and lungs, may also be injured. Blunt chest trauma can lead to serious medical problems. This injury requires immediate medical care. °CAUSES  °· Motor vehicle collisions. °· Falls. °· Physical violence. °· Sports injuries. °SYMPTOMS  °· Chest pain. The pain may be worse when you move or breathe deeply. °· Shortness of breath. °· Lightheadedness. °· Bruising. °· Tenderness. °· Swelling. °DIAGNOSIS  °Your caregiver will do a physical exam. X-rays may be taken to look for fractures. However, minor rib fractures may not show up on X-rays until a few days after the injury. If a more serious injury is suspected, further imaging tests may be done. This may include ultrasounds, computed tomography (CT) scans, or magnetic resonance imaging (MRI). °TREATMENT  °Treatment depends on the severity of your injury. Your caregiver may prescribe pain medicines and deep breathing exercises. °HOME CARE INSTRUCTIONS °· Limit your activities until you can move around without much pain. °· Do not do any strenuous work until your injury is healed. °· Put ice on the injured area. °¨ Put ice in a plastic bag. °¨ Place a towel between your skin and the bag. °¨ Leave the ice on for 15-20 minutes, 03-04 times a day. °· You may wear a rib belt as directed by your caregiver to reduce pain. °· Practice deep breathing as directed by your caregiver to keep your lungs clear. °· Only take over-the-counter or prescription medicines for pain, fever, or discomfort as directed by your caregiver. °SEEK IMMEDIATE MEDICAL  CARE IF:  °· You have increasing pain or shortness of breath. °· You cough up blood. °· You have nausea, vomiting, or abdominal pain. °· You have a fever. °· You feel dizzy, weak, or you faint. °MAKE SURE YOU: °· Understand these instructions. °· Will watch your condition. °· Will get help right away if you are not doing well or get worse. °Document Released: 04/16/2004 Document Revised: 06/01/2011 Document Reviewed: 12/24/2010 °Exit[employer]® Patient Information ©2015 [employer]. This information is not intended to replace advice given to you by your health care provider. Make sure you discuss any questions you have with your health care provider. ° ° °

## 2013-12-09 NOTE — ED Notes (Signed)
Bed: VV61 Expected date:  Expected time:  Means of arrival:  Comments: EMS 80yo F, fall, back pain

## 2013-12-09 NOTE — ED Notes (Signed)
Daughter driving patient back to [facility]

## 2013-12-09 NOTE — ED Notes (Signed)
Pt returned from XRAY 

## 2013-12-09 NOTE — ED Notes (Signed)
Water provided to the patient 233mL's

## 2013-12-09 NOTE — ED Notes (Signed)
Patient states she attempted to ambulate without her walker and fell. Patient states she struck her head on her dresser. Patient denies LOC, c/o back pain at this time.

## 2013-12-09 NOTE — ED Notes (Signed)
Pt assisted with the use of the BSC.

## 2013-12-09 NOTE — ED Notes (Signed)
Patient attempted to provide a urine sample however, she missed the collection hat. Will attempt again. Pt is aware of the need for the sample.

## 2013-12-09 NOTE — ED Notes (Signed)
Asked pt  for urine sample pt stated could not provide one at this time

## 2013-12-09 NOTE — ED Notes (Signed)
Restricted extremity bracelet applied to left wrist.  Fall Risk Bracelet applied to left wrist

## 2013-12-09 NOTE — ED Notes (Signed)
Two unsuccessful attempts at IV insertion. Tim Therapist, sports to attempt .

## 2013-12-18 ENCOUNTER — Encounter (HOSPITAL_COMMUNITY): Payer: Self-pay | Admitting: Emergency Medicine

## 2013-12-18 ENCOUNTER — Observation Stay (HOSPITAL_COMMUNITY)
Admission: EM | Admit: 2013-12-18 | Discharge: 2013-12-20 | Disposition: A | Discharge status: Skilled Nursing Facility | Payer: Medicare Other | Attending: Internal Medicine | Admitting: Internal Medicine

## 2013-12-18 ENCOUNTER — Emergency Department (HOSPITAL_COMMUNITY): Payer: Medicare Other

## 2013-12-18 DIAGNOSIS — I10 Essential (primary) hypertension: Secondary | ICD-10-CM | POA: Diagnosis not present

## 2013-12-18 DIAGNOSIS — R5383 Other fatigue: Secondary | ICD-10-CM | POA: Diagnosis not present

## 2013-12-18 DIAGNOSIS — J441 Chronic obstructive pulmonary disease with (acute) exacerbation: Principal | ICD-10-CM

## 2013-12-18 DIAGNOSIS — E039 Hypothyroidism, unspecified: Secondary | ICD-10-CM | POA: Diagnosis present

## 2013-12-18 DIAGNOSIS — D45 Polycythemia vera: Secondary | ICD-10-CM | POA: Insufficient documentation

## 2013-12-18 DIAGNOSIS — M810 Age-related osteoporosis without current pathological fracture: Secondary | ICD-10-CM | POA: Diagnosis not present

## 2013-12-18 DIAGNOSIS — F329 Major depressive disorder, single episode, unspecified: Secondary | ICD-10-CM | POA: Insufficient documentation

## 2013-12-18 DIAGNOSIS — S22009A Unspecified fracture of unspecified thoracic vertebra, initial encounter for closed fracture: Secondary | ICD-10-CM

## 2013-12-18 DIAGNOSIS — S298XXA Other specified injuries of thorax, initial encounter: Secondary | ICD-10-CM | POA: Diagnosis not present

## 2013-12-18 DIAGNOSIS — Z87891 Personal history of nicotine dependence: Secondary | ICD-10-CM | POA: Insufficient documentation

## 2013-12-18 DIAGNOSIS — Y929 Unspecified place or not applicable: Secondary | ICD-10-CM | POA: Insufficient documentation

## 2013-12-18 DIAGNOSIS — Z79899 Other long term (current) drug therapy: Secondary | ICD-10-CM | POA: Diagnosis not present

## 2013-12-18 DIAGNOSIS — M549 Dorsalgia, unspecified: Secondary | ICD-10-CM | POA: Diagnosis not present

## 2013-12-18 DIAGNOSIS — Z8673 Personal history of transient ischemic attack (TIA), and cerebral infarction without residual deficits: Secondary | ICD-10-CM | POA: Insufficient documentation

## 2013-12-18 DIAGNOSIS — Z853 Personal history of malignant neoplasm of breast: Secondary | ICD-10-CM | POA: Diagnosis not present

## 2013-12-18 DIAGNOSIS — R609 Edema, unspecified: Secondary | ICD-10-CM

## 2013-12-18 DIAGNOSIS — R404 Transient alteration of awareness: Secondary | ICD-10-CM | POA: Diagnosis not present

## 2013-12-18 DIAGNOSIS — Z9010 Acquired absence of unspecified breast and nipple: Secondary | ICD-10-CM | POA: Insufficient documentation

## 2013-12-18 DIAGNOSIS — E860 Dehydration: Secondary | ICD-10-CM | POA: Diagnosis not present

## 2013-12-18 DIAGNOSIS — R627 Adult failure to thrive: Secondary | ICD-10-CM | POA: Diagnosis not present

## 2013-12-18 DIAGNOSIS — Z9181 History of falling: Secondary | ICD-10-CM | POA: Insufficient documentation

## 2013-12-18 DIAGNOSIS — W19XXXA Unspecified fall, initial encounter: Secondary | ICD-10-CM | POA: Diagnosis not present

## 2013-12-18 DIAGNOSIS — I6789 Other cerebrovascular disease: Secondary | ICD-10-CM | POA: Insufficient documentation

## 2013-12-18 DIAGNOSIS — G459 Transient cerebral ischemic attack, unspecified: Secondary | ICD-10-CM | POA: Diagnosis present

## 2013-12-18 DIAGNOSIS — R633 Feeding difficulties, unspecified: Secondary | ICD-10-CM | POA: Diagnosis not present

## 2013-12-18 DIAGNOSIS — F3289 Other specified depressive episodes: Secondary | ICD-10-CM | POA: Insufficient documentation

## 2013-12-18 DIAGNOSIS — Z7982 Long term (current) use of aspirin: Secondary | ICD-10-CM | POA: Insufficient documentation

## 2013-12-18 DIAGNOSIS — G4700 Insomnia, unspecified: Secondary | ICD-10-CM

## 2013-12-18 DIAGNOSIS — K5900 Constipation, unspecified: Secondary | ICD-10-CM

## 2013-12-18 DIAGNOSIS — R079 Chest pain, unspecified: Secondary | ICD-10-CM | POA: Diagnosis not present

## 2013-12-18 DIAGNOSIS — I509 Heart failure, unspecified: Secondary | ICD-10-CM | POA: Diagnosis present

## 2013-12-18 DIAGNOSIS — R5381 Other malaise: Secondary | ICD-10-CM | POA: Diagnosis not present

## 2013-12-18 HISTORY — DX: Unspecified fracture of unspecified thoracic vertebra, initial encounter for closed fracture: S22.009A

## 2013-12-18 HISTORY — DX: Transient cerebral ischemic attack, unspecified: G45.9

## 2013-12-18 LAB — CBC WITH DIFFERENTIAL/PLATELET
Basophils Absolute: 0 10*3/uL (ref 0.0–0.1)
Basophils Relative: 0 % (ref 0–1)
EOS ABS: 0.1 10*3/uL (ref 0.0–0.7)
Eosinophils Relative: 2 % (ref 0–5)
HEMATOCRIT: 51.4 % — AB (ref 36.0–46.0)
HEMOGLOBIN: 17.2 g/dL — AB (ref 12.0–15.0)
LYMPHS ABS: 0.9 10*3/uL (ref 0.7–4.0)
Lymphocytes Relative: 14 % (ref 12–46)
MCH: 31.7 pg (ref 26.0–34.0)
MCHC: 33.5 g/dL (ref 30.0–36.0)
MCV: 94.8 fL (ref 78.0–100.0)
MONOS PCT: 13 % — AB (ref 3–12)
Monocytes Absolute: 0.9 10*3/uL (ref 0.1–1.0)
NEUTROS PCT: 71 % (ref 43–77)
Neutro Abs: 4.6 10*3/uL (ref 1.7–7.7)
Platelets: 450 10*3/uL — ABNORMAL HIGH (ref 150–400)
RBC: 5.42 MIL/uL — AB (ref 3.87–5.11)
RDW: 15.9 % — ABNORMAL HIGH (ref 11.5–15.5)
WBC: 6.5 10*3/uL (ref 4.0–10.5)

## 2013-12-18 LAB — URINALYSIS, ROUTINE W REFLEX MICROSCOPIC
Glucose, UA: NEGATIVE mg/dL
Hgb urine dipstick: NEGATIVE
KETONES UR: 40 mg/dL — AB
Leukocytes, UA: NEGATIVE
Nitrite: NEGATIVE
PROTEIN: NEGATIVE mg/dL
Specific Gravity, Urine: 1.037 — ABNORMAL HIGH (ref 1.005–1.030)
UROBILINOGEN UA: 0.2 mg/dL (ref 0.0–1.0)
pH: 5 (ref 5.0–8.0)

## 2013-12-18 LAB — COMPREHENSIVE METABOLIC PANEL
ALK PHOS: 102 U/L (ref 39–117)
ALT: 16 U/L (ref 0–35)
AST: 20 U/L (ref 0–37)
Albumin: 4.4 g/dL (ref 3.5–5.2)
Anion gap: 18 — ABNORMAL HIGH (ref 5–15)
BILIRUBIN TOTAL: 0.5 mg/dL (ref 0.3–1.2)
BUN: 13 mg/dL (ref 6–23)
CO2: 26 mEq/L (ref 19–32)
Calcium: 10.1 mg/dL (ref 8.4–10.5)
Chloride: 100 mEq/L (ref 96–112)
Creatinine, Ser: 0.56 mg/dL (ref 0.50–1.10)
GFR calc non Af Amer: 81 mL/min — ABNORMAL LOW (ref >90)
GLUCOSE: 93 mg/dL (ref 70–99)
POTASSIUM: 3.5 meq/L — AB (ref 3.7–5.3)
Sodium: 144 mEq/L (ref 137–147)
TOTAL PROTEIN: 7.5 g/dL (ref 6.0–8.3)

## 2013-12-18 LAB — I-STAT TROPONIN, ED: Troponin i, poc: 0 ng/mL (ref 0.00–0.08)

## 2013-12-18 MED ORDER — POTASSIUM CHLORIDE CRYS ER 20 MEQ PO TBCR
20.0000 meq | EXTENDED_RELEASE_TABLET | Freq: Once | ORAL | Status: AC
  Administered 2013-12-18: 20 meq via ORAL
  Filled 2013-12-18: qty 1

## 2013-12-18 MED ORDER — LISINOPRIL 20 MG PO TABS
20.0000 mg | ORAL_TABLET | Freq: Every morning | ORAL | Status: DC
  Administered 2013-12-19 – 2013-12-20 (×2): 20 mg via ORAL
  Filled 2013-12-18 (×2): qty 1

## 2013-12-18 MED ORDER — HYDROCODONE-ACETAMINOPHEN 5-325 MG PO TABS
1.0000 | ORAL_TABLET | ORAL | Status: DC | PRN
  Administered 2013-12-18 – 2013-12-20 (×3): 1 via ORAL
  Filled 2013-12-18 (×4): qty 1

## 2013-12-18 MED ORDER — ALBUTEROL SULFATE (2.5 MG/3ML) 0.083% IN NEBU: 2.5000 mg | INHALATION_SOLUTION | RESPIRATORY_TRACT | Status: DC | PRN

## 2013-12-18 MED ORDER — HYDROCODONE-ACETAMINOPHEN 5-325 MG PO TABS
1.0000 | ORAL_TABLET | Freq: Once | ORAL | Status: AC
  Administered 2013-12-18: 1 via ORAL
  Filled 2013-12-18: qty 1

## 2013-12-18 MED ORDER — MORPHINE SULFATE 2 MG/ML IJ SOLN: 2.0000 mg | INTRAMUSCULAR | Status: DC | PRN

## 2013-12-18 MED ORDER — ASPIRIN 81 MG PO CHEW
81.0000 mg | CHEWABLE_TABLET | Freq: Every morning | ORAL | Status: DC
  Administered 2013-12-19 – 2013-12-20 (×2): 81 mg via ORAL
  Filled 2013-12-18 (×2): qty 1

## 2013-12-18 MED ORDER — CLONIDINE HCL 0.1 MG PO TABS
0.1000 mg | ORAL_TABLET | Freq: Every morning | ORAL | Status: DC
  Administered 2013-12-19 – 2013-12-20 (×2): 0.1 mg via ORAL
  Filled 2013-12-18 (×2): qty 1

## 2013-12-18 MED ORDER — KCL IN DEXTROSE-NACL 20-5-0.45 MEQ/L-%-% IV SOLN
INTRAVENOUS | Status: AC
  Administered 2013-12-18: via INTRAVENOUS
  Filled 2013-12-18 (×2): qty 1000

## 2013-12-18 MED ORDER — ADULT MULTIVITAMIN W/MINERALS CH
1.0000 | ORAL_TABLET | Freq: Every morning | ORAL | Status: DC
  Administered 2013-12-19 – 2013-12-20 (×2): 1 via ORAL
  Filled 2013-12-18 (×2): qty 1

## 2013-12-18 MED ORDER — LEVOTHYROXINE SODIUM 50 MCG PO TABS
50.0000 ug | ORAL_TABLET | Freq: Every day | ORAL | Status: DC
  Administered 2013-12-19 – 2013-12-20 (×2): 50 ug via ORAL
  Filled 2013-12-18 (×3): qty 1

## 2013-12-18 MED ORDER — ACETAMINOPHEN 325 MG PO TABS
650.0000 mg | ORAL_TABLET | ORAL | Status: DC | PRN
  Filled 2013-12-18: qty 2

## 2013-12-18 MED ORDER — DOCUSATE SODIUM 100 MG PO CAPS
100.0000 mg | ORAL_CAPSULE | Freq: Two times a day (BID) | ORAL | Status: DC
  Administered 2013-12-18 – 2013-12-20 (×4): 100 mg via ORAL

## 2013-12-18 MED ORDER — DILTIAZEM HCL ER COATED BEADS 120 MG PO CP24
120.0000 mg | ORAL_CAPSULE | Freq: Every day | ORAL | Status: DC
  Administered 2013-12-19 – 2013-12-20 (×2): 120 mg via ORAL
  Filled 2013-12-18 (×2): qty 1

## 2013-12-18 MED ORDER — POLYETHYLENE GLYCOL 3350 17 G PO PACK: 17.0000 g | PACK | Freq: Every day | ORAL | Status: DC | PRN

## 2013-12-18 MED ORDER — ALUM & MAG HYDROXIDE-SIMETH 200-200-20 MG/5ML PO SUSP
30.0000 mL | Freq: Four times a day (QID) | ORAL | Status: DC | PRN
Start: 2013-12-18 — End: 2013-12-20

## 2013-12-18 MED ORDER — ENOXAPARIN SODIUM 40 MG/0.4ML ~~LOC~~ SOLN
40.0000 mg | SUBCUTANEOUS | Status: DC
  Administered 2013-12-19 – 2013-12-20 (×2): 40 mg via SUBCUTANEOUS
  Filled 2013-12-18 (×3): qty 0.4

## 2013-12-18 MED ORDER — TIOTROPIUM BROMIDE MONOHYDRATE 18 MCG IN CAPS
18.0000 ug | ORAL_CAPSULE | Freq: Every day | RESPIRATORY_TRACT | Status: DC
  Administered 2013-12-19 – 2013-12-20 (×2): 18 ug via RESPIRATORY_TRACT
  Filled 2013-12-18: qty 5

## 2013-12-18 MED ORDER — ONDANSETRON HCL 4 MG/2ML IJ SOLN: 4.0000 mg | Freq: Four times a day (QID) | INTRAMUSCULAR | Status: DC | PRN

## 2013-12-18 MED ORDER — INFLUENZA VAC SPLIT QUAD 0.5 ML IM SUSY
0.5000 mL | PREFILLED_SYRINGE | INTRAMUSCULAR | Status: AC
Start: 2013-12-19 — End: 2013-12-19
  Administered 2013-12-19: 0.5 mL via INTRAMUSCULAR
  Filled 2013-12-18 (×2): qty 0.5

## 2013-12-18 MED ORDER — TRAMADOL HCL 50 MG PO TABS
50.0000 mg | ORAL_TABLET | Freq: Four times a day (QID) | ORAL | Status: DC | PRN
  Administered 2013-12-19 – 2013-12-20 (×3): 50 mg via ORAL
  Filled 2013-12-18 (×3): qty 1

## 2013-12-18 MED ORDER — ONDANSETRON HCL 4 MG PO TABS
4.0000 mg | ORAL_TABLET | Freq: Four times a day (QID) | ORAL | Status: DC | PRN
Start: 2013-12-18 — End: 2013-12-19

## 2013-12-18 MED ORDER — CARVEDILOL 12.5 MG PO TABS
12.5000 mg | ORAL_TABLET | Freq: Two times a day (BID) | ORAL | Status: DC
  Administered 2013-12-19 – 2013-12-20 (×3): 12.5 mg via ORAL
  Filled 2013-12-18 (×5): qty 1

## 2013-12-18 MED ORDER — ALBUTEROL SULFATE HFA 108 (90 BASE) MCG/ACT IN AERS: 2.0000 | INHALATION_SPRAY | RESPIRATORY_TRACT | Status: DC | PRN

## 2013-12-18 MED ORDER — SERTRALINE HCL 50 MG PO TABS
50.0000 mg | ORAL_TABLET | Freq: Every day | ORAL | Status: DC
  Administered 2013-12-18 – 2013-12-19 (×2): 50 mg via ORAL
  Filled 2013-12-18 (×3): qty 1

## 2013-12-18 NOTE — ED Notes (Signed)
Pt from [facility]

## 2013-12-18 NOTE — ED Notes (Signed)
Pt still not present in room.

## 2013-12-18 NOTE — ED Notes (Signed)
Bed: WA17 Expected date:  Expected time:  Means of arrival:  Comments: EMS- elderly, not eating

## 2013-12-18 NOTE — Progress Notes (Signed)
Clinical Social Work Department BRIEF PSYCHOSOCIAL ASSESSMENT 12/18/2013  Patient:  Dana George, Dana George     Account Number:  [id number]     Admit date:  12/18/2013  Clinical Social Worker:  Luretha Rued  Date/Time:  12/18/2013 05:30 PM  Referred by:  CSW  Date Referred:  12/18/2013  Other Referral:   Interview type:  Patient Other interview type:   Daughter at bedside    PSYCHOSOCIAL DATA Living Status:  FACILITY Admitted from facility:  [facility] Level of care:  Independent Living Primary support name:  Gwinda Passe Primary support relationship to patient:  CHILD, ADULT Degree of support available:   Daughter Mendel Ryder at bedside    [place]  Other Concerns:    SOCIAL WORK ASSESSMENT / PLAN CSW met with the patient and daughter at bedside to complete this assessment.  Patient is alert, oriented x3, calm, and cooperative. Patient reports having a fall in her home last week and since that time been declining in her ability to care for herself.  She reports that she lives at [facility] independent apartments.  She reports having a Aide from an agency called [facility] that comes to to help 3xs a week and stay for3 hours a day. According to her daughter this past week her and her sister had been taking turns to provide 24hour care for patient. the daughter believes that mother is meeting critieria at this time for a higher level of residential living environment and would like a hospice consult.  It was reported that the daughter Gwinda Passe is the POA.   Assessment/plan status:  Psychosocial Support/Ongoing Assessment of Needs Other assessment/ plan:   Information/referral to community resources:   NA    PATIENT'S/FAMILY'S RESPONSE TO PLAN OF CARE: Patient and family expressed their appreiciation for the support of the social work department.     Chesley Noon, MSW, [place], 12/18/2013 Evening Clinical Social Worker [phone]

## 2013-12-18 NOTE — ED Notes (Signed)
Pt gone to radiology 

## 2013-12-18 NOTE — ED Notes (Signed)
Per EMS Pt fell a week ago Sunday, no injuries, given pain meds. Pain meds made pt sick, daughter took pt off perscription pain meds and started over the counter ibuprofen and tylenol. Weakness, loss of appetite, complaining of pain in back.

## 2013-12-18 NOTE — ED Provider Notes (Signed)
CSN: [id number]     Arrival date & time 12/18/13  1218 History   First MD Initiated Contact with Patient 12/18/13 1449     Chief Complaint  Patient presents with  . Back Pain     (Consider location/radiation/quality/duration/timing/severity/associated sxs/prior Treatment) Patient is a 80 y.o. female presenting with back pain.  Back Pain Location:  Lumbar spine Quality:  Aching Radiates to:  Does not radiate Pain severity:  Moderate Onset quality:  Sudden Duration:  1 week Timing:  Constant Progression:  Unchanged Chronicity:  New Context: falling (1 week ago with unremarkable wu)   Relieved by:  Nothing Worsened by:  Palpation, ambulation, bending, touching and twisting Associated symptoms: no abdominal pain, no chest pain, no fever and no weakness     Past Medical History  Diagnosis Date  . Hypothyroidism   . Hypertension   . Shortness of breath   . COPD (chronic obstructive pulmonary disease)   . Osteoporosis   . Cancer 1980    Breast cancer  . TIA (transient ischemic attack)    Past Surgical History  Procedure Laterality Date  . Abdominal hysterectomy    . Total knee arthroplasty  2010    left knee  . Mastectomy  1980    left side  . Tonsillectomy    . Exploratory laparotomy w/ bowel resection      10/19/12-11/01/12 [facility] for exploratory laparotomty with extensive lysis of adhesion and segmental small bowel resection with end to end anastomosis per Dr. Isidore Moos 10/22/12  . Cataract extraction, bilateral     Family History  Problem Relation Age of Onset  . High blood pressure Mother   . Osteoporosis Mother   . Heart attack Father     d/o 4   History  Substance Use Topics  . Smoking status: Former Smoker -- 1.00 packs/day for 45 years    Quit date: 01/26/1986  . Smokeless tobacco: Not on file  . Alcohol Use: No     Comment: previously   OB History   Grav Para Term Preterm Abortions TAB SAB Ect Mult Living                 Review of Systems   Constitutional: Negative for fever.  Cardiovascular: Negative for chest pain.  Gastrointestinal: Negative for abdominal pain.  Musculoskeletal: Positive for back pain.  Neurological: Negative for weakness.  All other systems reviewed and are negative.     Allergies  Review of patient's allergies indicates no known allergies.  Home Medications   Prior to Admission medications   Medication Sig Start Date End Date Taking? Authorizing Provider  acetaminophen (TYLENOL) 325 MG tablet Take 650 mg by mouth every 4 (four) hours as needed for moderate pain.   Yes Historical Provider, MD  aspirin 81 MG chewable tablet Chew 81 mg by mouth every morning.   Yes Historical Provider, MD  carvedilol (COREG) 12.5 MG tablet Take 12.5 mg by mouth 2 (two) times daily with a meal.   Yes Historical Provider, MD  cloNIDine (CATAPRES) 0.1 MG tablet Take 0.1 mg by mouth every morning.    Yes Historical Provider, MD  diltiazem (CARDIZEM CD) 120 MG 24 hr capsule Take 120 mg by mouth daily.  07/11/13  Yes Historical Provider, MD  docusate sodium (COLACE) 100 MG capsule Take 1 capsule (100 mg total) by mouth 2 (two) times daily. 12/09/13  Yes Dorie Rank, MD  HYDROcodone-acetaminophen (NORCO/VICODIN) 5-325 MG per tablet Take 1 tablet by mouth every 4 (four)  hours as needed. 12/09/13  Yes Dorie Rank, MD  ibuprofen (ADVIL,MOTRIN) 200 MG tablet Take 400 mg by mouth every 6 (six) hours as needed for moderate pain.   Yes Historical Provider, MD  levothyroxine (SYNTHROID, LEVOTHROID) 50 MCG tablet Take 50 mcg by mouth every morning.    Yes Historical Provider, MD  lisinopril (PRINIVIL,ZESTRIL) 40 MG tablet Take 20 mg by mouth every morning.    Yes Historical Provider, MD  Melatonin 3 MG TBDP Take 3 mg by mouth at bedtime.   Yes Historical Provider, MD  Multiple Vitamin (MULTIVITAMIN WITH MINERALS) TABS tablet Take 1 tablet by mouth every morning.   Yes Historical Provider, MD  polyethylene glycol (MIRALAX / GLYCOLAX) packet  Take 17 g by mouth daily as needed.    Yes Historical Provider, MD  sertraline (ZOLOFT) 50 MG tablet Take 50 mg by mouth at bedtime.   Yes Historical Provider, MD  tiotropium (SPIRIVA) 18 MCG inhalation capsule Place 18 mcg into inhaler and inhale daily.   Yes Historical Provider, MD  furosemide (LASIX) 20 MG tablet Take 10 mg by mouth every other day.     Historical Provider, MD   BP 158/60  Pulse 63  Temp(Src) 98.7 F (37.1 C) (Oral)  Resp 18  Ht 5\' 2"  (1.575 m)  Wt 121 lb 8 oz (55.112 kg)  BMI 22.22 kg/m2  SpO2 96% Physical Exam  Vitals reviewed. Constitutional: She is oriented to person, place, and time. She appears well-developed and well-nourished.  HENT:  Head: Normocephalic and atraumatic.  Right Ear: External ear normal.  Left Ear: External ear normal.  Eyes: Conjunctivae and EOM are normal. Pupils are equal, round, and reactive to light.  Neck: Normal range of motion. Neck supple.  Cardiovascular: Normal rate, regular rhythm, normal heart sounds and intact distal pulses.   Pulmonary/Chest: Effort normal and breath sounds normal.  Abdominal: Soft. Bowel sounds are normal. There is no tenderness.  Musculoskeletal: Normal range of motion.       Thoracic back: Normal.       Lumbar back: She exhibits tenderness and bony tenderness.  Neurological: She is alert and oriented to person, place, and time. She has normal reflexes. No cranial nerve deficit or sensory deficit. She exhibits normal muscle tone. Gait normal.  Skin: Skin is warm and dry.    ED Course  Procedures (including critical care time) Labs Review Labs Reviewed  CBC WITH DIFFERENTIAL - Abnormal; Notable for the following:    RBC 5.42 (*)    Hemoglobin 17.2 (*)    HCT 51.4 (*)    RDW 15.9 (*)    Platelets 450 (*)    Monocytes Relative 13 (*)    All other components within normal limits  COMPREHENSIVE METABOLIC PANEL - Abnormal; Notable for the following:    Potassium 3.5 (*)    GFR calc non Af Amer 81 (*)     Anion gap 18 (*)    All other components within normal limits  URINALYSIS, ROUTINE W REFLEX MICROSCOPIC - Abnormal; Notable for the following:    Color, Urine AMBER (*)    APPearance CLOUDY (*)    Specific Gravity, Urine 1.037 (*)    Bilirubin Urine SMALL (*)    Ketones, ur 40 (*)    All other components within normal limits  URINE CULTURE  BASIC METABOLIC PANEL  CBC  I-STAT TROPOININ, ED    Imaging Review Dg Chest 2 View  12/18/2013   CLINICAL DATA:  Pain post trauma ;  hypertension  EXAM: CHEST  2 VIEW  COMPARISON:  December 09, 2013  FINDINGS: There is no edema or consolidation. Heart size and pulmonary vascularity are normal. No adenopathy. There are surgical clips in the left axillary region. There is evidence of an old fracture of the left lateral seventh rib. There is marked collapse of the T8 vertebral body. There is also collapse of the T3 vertebral body.  IMPRESSION: Collapse of the T3 and T8 vertebral bodies which may be recent. Old healed fracture of left lateral seventh rib. Lungs clear. No pneumothorax.   Electronically Signed   By: Lowella Grip M.D.   On: 12/18/2013 15:48   Dg Thoracic Spine 2 View  12/18/2013   CLINICAL DATA:  Fall.  Back pain.  EXAM: THORACIC SPINE - 2 VIEW  COMPARISON:  Chest x-ray from 9/ 19/15 and 12/10/2008.  FINDINGS: Two view exam of the thoracic spine shows compression deformity at the T6 and T10 vertebral bodies. Compression T5 results in about 50% loss of height anteriorly and 25-50% loss of height posteriorly. No evidence for posterior bony retropulsion by x-ray. This compression deformity is stable since 12/10/2008.  At T10, there is 50-75% loss of height anteriorly with 50% loss of height posteriorly. Again, no x-ray evidence of posterior bony retropulsion. This compression deformity appears new since 12/10/2008 and also when comparing to a frontal chest radiograph with oblique views of ribs dated 12/09/2013.  IMPRESSION: T5 compression  fracture stable since 12/10/2008.  T10 compression fracture appears new since 12/09/2013.   Electronically Signed   By: Misty Stanley M.D.   On: 12/18/2013 15:56   Dg Lumbar Spine Complete  12/18/2013   CLINICAL DATA:  Fall with back pain.  EXAM: LUMBAR SPINE - COMPLETE 4+ VIEW  COMPARISON:  None.  FINDINGS: Four views study shows no evidence for fracture or subluxation involving the lumbar spine. Intervertebral disc spaces are preserved except at L1-2 with there is some mild loss of intervertebral disc height and mild endplate degeneration. Bones are diffusely demineralized.  T10 compression fracture is evident.  IMPRESSION: No evidence for lumbar spine fracture.  Compression deformity at the level of T10. Comparing back to a chest x-ray from 12/09/2013, this appears new in the interval.   Electronically Signed   By: Misty Stanley M.D.   On: 12/18/2013 15:53   Ct Head Wo Contrast  12/18/2013   CLINICAL DATA:  Status post fall 8 days ago without known injury possible medication reaction, weakness, back pain.  EXAM: CT HEAD WITHOUT CONTRAST  TECHNIQUE: Contiguous axial images were obtained from the base of the skull through the vertex without intravenous contrast.  COMPARISON:  None.  FINDINGS: There is mild diffuse cerebral and cerebellar atrophy with compensatory ventriculomegaly. There is no shift of the midline. There is no acute intracranial hemorrhage. There is extensive deep white matter hypodensity in both cerebral hemispheres consistent with chronic small vessel ischemia. No acute ischemic changes are demonstrated.  The observed paranasal sinuses and mastoid air cells are clear. There is no acute skull fracture nor cephalohematoma.  IMPRESSION: 1. There is no acute intracranial hemorrhage nor other acute abnormality of the brain. There are extensive changes of chronic small vessel ischemia. 2. There is no acute skull fracture.   Electronically Signed   By: David  Martinique   On: 12/18/2013 15:51      EKG Interpretation   Date/Time:  Monday December 18 2013 16:28:19 EDT Ventricular Rate:  66 PR Interval:  177 QRS Duration: 97  QT Interval:  481 QTC Calculation: 504 R Axis:   -32 Text Interpretation:  Sinus rhythm Left ventricular hypertrophy Borderline  T abnormalities, inferior leads Prolonged QT interval No old tracing to  compare Confirmed by Debby Freiberg [phone] on 12/18/2013 4:33:16 PM      MDM   Final diagnoses:  Thoracic spine fracture, closed, initial encounter  COPD exacerbation  Essential hypertension    80 y.o. female with pertinent PMH of COPD, generalized weakness gradually progressing over last year presents with continued back pain and inability to self at home in context of fall one week ago. Patient was seen here for same and had unremarkable workup for acute traumatic injury. She did not have a CT scan at that time because she had no concussive symptoms and did not endorse  headache or hit to head. She's had rapid progression of her generalized weakness and inability care for self or last week in the setting of his recent fall. She presents today in company of her daughter who is concerned she is no longer able to care for self holding facility as it stands. She is interested in hospice and was told that this would be the quickest way of expediting the process.  On arrival today vitals signs and physical exam as above not consistent with any acute traumatic injury with the exception of midline lumbar spinal tenderness. Patient has no focal neurodeficits and has no historical or exam elements suggesting cauda equina.  XR demonstrated numerous compression fractures of spine.  Spoke with [facility] to arrange fu and they recommended lumbar corset.  Given degree of pain and inability to care for self, pt admitted in stable condition.  1. Thoracic spine fracture, closed, initial encounter   2. COPD exacerbation   3. Essential hypertension         Debby Freiberg,  MD 12/19/13 [id number]

## 2013-12-18 NOTE — H&P (Signed)
[facility] History and Physical  [name] TKZ:[id number] DOB: [birth year] DOA: 12/18/2013  Referring physician:  Debby Freiberg PCP:  Precious Reel, MD   Chief Complaint:  Back pain  HPI:  The patient is a 80 y.o. year-old female with history of COPD, HTN, hypertension, osteoporosis who presents with fall 8 days ago with persistent back pain and failure to thrive.  Dana George was seen in the ER the same day and had XR of her ribs which demonstrated no fractures.  Dana George was seen by PT/OT who established Dana George was able to ambulate with her walker and Dana George was discharged back to [facility] with a prescription for vicodin.  Dana George had clinical improvement for the first 3-4 days, but was increasingly confused while using the vicodin.  About a week ago, Dana George started lying in bed.  Dana George was only able to get up to the bedside commode with assistance and Dana George lost her appetite.  Dana George has been eating and drinking very little.  Her daughters stopped her vicodin, thinking that may be contributing to her weakness and they used tylenol and ibuprofen instead, hwoever, these medications have not been relieving her 6/10 mid-thoracic back pain.  Movement makes her pain worse, lying still or taking vicodin helps.  Her family had her brought back to the ER because Dana George has become progressively weaker.    In the ER, labs notable for hemoglobin 17.2, platelets 450 consistent with dehydration.  CT head without acute abnl.  CXR with suggestion of new T3, T8 compression fractures but without pneumonia or PTX or acute rib fractures.  Thoracic and lumbar spine XR showed an old T5 fracture and a new compression fracture of T10.  ER MD spoke with neurosurgeon on call who recommended Dana George be placed in a lumbar corset and follow up with neurosurgery as outpatient.  Dana George is being admitted for pain control and dehydration.    Review of Systems:  General:  Denies fevers, chills, has had at least a 30-lbs weight loss over the last  year HEENT:  Denies changes to hearing and vision, rhinorrhea, sinus congestion, sore throat CV:  Denies chest pain and palpitations, lower extremity edema.  PULM:  Denies SOB, wheezing, cough.   GI:  Denies nausea, vomiting, constipation, diarrhea.   GU:  Denies dysuria, frequency, urgency ENDO:  Denies polyuria, polydipsia.   HEME:  Denies hematemesis, blood in stools, melena, abnormal bruising or bleeding.  Stools have been darker than usual.   LYMPH:  Denies lymphadenopathy.   MSK:  Per HPI.     DERM:  Denies skin rash or ulcer.   NEURO:  Denies focal numbness, weakness, slurred speech, confusion, facial droop.  PSYCH:  Denies anxiety and depression.    Past Medical History  Diagnosis Date  . Hypothyroidism   . Hypertension   . Shortness of breath   . COPD (chronic obstructive pulmonary disease)   . Osteoporosis   . Cancer 1980    Breast cancer  . TIA (transient ischemic attack)    Past Surgical History  Procedure Laterality Date  . Abdominal hysterectomy    . Total knee arthroplasty  2010    left knee  . Mastectomy  1980    left side  . Tonsillectomy    . Exploratory laparotomy w/ bowel resection      10/19/12-11/01/12 [facility] for exploratory laparotomty with extensive lysis of adhesion and segmental small bowel resection with end to end anastomosis per Dr. Isidore Moos 10/22/12  . Cataract extraction,  bilateral     Social History:  reports that Dana George quit smoking about 27 years ago. Dana George does not have any smokeless tobacco history on file. Dana George reports that Dana George does not drink alcohol or use illicit drugs. [facility], using a walker prior to fall, bedridden.  PT/OT before the fall.    No Known Allergies  Family History  Problem Relation Age of Onset  . High blood pressure Mother   . Osteoporosis Mother   . Heart attack Father     d/o 8     Prior to Admission medications   Medication Sig Start Date End Date Taking? Authorizing Provider  acetaminophen (TYLENOL)  325 MG tablet Take 650 mg by mouth every 4 (four) hours as needed for moderate pain.   Yes Historical Provider, MD  aspirin 81 MG chewable tablet Chew 81 mg by mouth every morning.   Yes Historical Provider, MD  carvedilol (COREG) 12.5 MG tablet Take 12.5 mg by mouth 2 (two) times daily with a meal.   Yes Historical Provider, MD  cloNIDine (CATAPRES) 0.1 MG tablet Take 0.1 mg by mouth every morning.    Yes Historical Provider, MD  diltiazem (CARDIZEM CD) 120 MG 24 hr capsule Take 120 mg by mouth daily.  07/11/13  Yes Historical Provider, MD  docusate sodium (COLACE) 100 MG capsule Take 1 capsule (100 mg total) by mouth 2 (two) times daily. 12/09/13  Yes Dorie Rank, MD  HYDROcodone-acetaminophen (NORCO/VICODIN) 5-325 MG per tablet Take 1 tablet by mouth every 4 (four) hours as needed. 12/09/13  Yes Dorie Rank, MD  ibuprofen (ADVIL,MOTRIN) 200 MG tablet Take 400 mg by mouth every 6 (six) hours as needed for moderate pain.   Yes Historical Provider, MD  levothyroxine (SYNTHROID, LEVOTHROID) 50 MCG tablet Take 50 mcg by mouth every morning.    Yes Historical Provider, MD  lisinopril (PRINIVIL,ZESTRIL) 40 MG tablet Take 20 mg by mouth every morning.    Yes Historical Provider, MD  Melatonin 3 MG TBDP Take 3 mg by mouth at bedtime.   Yes Historical Provider, MD  Multiple Vitamin (MULTIVITAMIN WITH MINERALS) TABS tablet Take 1 tablet by mouth every morning.   Yes Historical Provider, MD  polyethylene glycol (MIRALAX / GLYCOLAX) packet Take 17 g by mouth daily as needed.    Yes Historical Provider, MD  sertraline (ZOLOFT) 50 MG tablet Take 50 mg by mouth at bedtime.   Yes Historical Provider, MD  tiotropium (SPIRIVA) 18 MCG inhalation capsule Place 18 mcg into inhaler and inhale daily.   Yes Historical Provider, MD  furosemide (LASIX) 20 MG tablet Take 10 mg by mouth every other day.     Historical Provider, MD   Physical Exam: Filed Vitals:   12/18/13 1219 12/18/13 1644 12/18/13 1719  BP: 165/62 188/77  166/57  Pulse: 60 64   Temp: 97.3 F (36.3 C)    TempSrc: Oral    Resp: 16 22 18   SpO2: 95% 95%      General:  Thin WF, NAD  Eyes:  PERRL, anicteric, non-injected.  ENT:  Nares clear.  OP clear, non-erythematous without plaques or exudates.  MMM.  Neck:  Supple without TM or JVD.    Lymph:  No cervical, supraclavicular, or submandibular LAD.  Cardiovascular:  RRR, normal S1, S2, without m/r/g.  2+ pulses, warm extremities  Respiratory:  Diminished bilateral BS without wheezes, rales, or rhonchi, no increased WOB.  Abdomen:  NABS.  Soft, ND/NT.    Skin:  No rashes  or focal lesions.  Musculoskeletal:  Normal bulk and tone.  No LE edema.  Psychiatric:  A & O x 4.  Appropriate affect.  Neurologic:  CN 3-12 intact.  5/5 strength.  Sensation intact lower extremities  Labs on Admission:  Basic Metabolic Panel:  Recent Labs Lab 12/18/13 1621  NA 144  K 3.5*  CL 100  CO2 26  GLUCOSE 93  BUN 13  CREATININE 0.56  CALCIUM 10.1   Liver Function Tests:  Recent Labs Lab 12/18/13 1621  AST 20  ALT 16  ALKPHOS 102  BILITOT 0.5  PROT 7.5  ALBUMIN 4.4   No results found for this basename: LIPASE, AMYLASE,  in the last 168 hours No results found for this basename: AMMONIA,  in the last 168 hours CBC:  Recent Labs Lab 12/18/13 1621  WBC 6.5  NEUTROABS 4.6  HGB 17.2*  HCT 51.4*  MCV 94.8  PLT 450*   Cardiac Enzymes: No results found for this basename: CKTOTAL, CKMB, CKMBINDEX, TROPONINI,  in the last 168 hours  BNP (last 3 results) No results found for this basename: PROBNP,  in the last 8760 hours CBG: No results found for this basename: GLUCAP,  in the last 168 hours  Radiological Exams on Admission: Dg Chest 2 View  12/18/2013   CLINICAL DATA:  Pain post trauma ; hypertension  EXAM: CHEST  2 VIEW  COMPARISON:  December 09, 2013  FINDINGS: There is no edema or consolidation. Heart size and pulmonary vascularity are normal. No adenopathy. There are  surgical clips in the left axillary region. There is evidence of an old fracture of the left lateral seventh rib. There is marked collapse of the T8 vertebral body. There is also collapse of the T3 vertebral body.  IMPRESSION: Collapse of the T3 and T8 vertebral bodies which may be recent. Old healed fracture of left lateral seventh rib. Lungs clear. No pneumothorax.   Electronically Signed   By: Lowella Grip M.D.   On: 12/18/2013 15:48   Dg Thoracic Spine 2 View  12/18/2013   CLINICAL DATA:  Fall.  Back pain.  EXAM: THORACIC SPINE - 2 VIEW  COMPARISON:  Chest x-ray from 9/ 19/15 and 12/10/2008.  FINDINGS: Two view exam of the thoracic spine shows compression deformity at the T6 and T10 vertebral bodies. Compression T5 results in about 50% loss of height anteriorly and 25-50% loss of height posteriorly. No evidence for posterior bony retropulsion by x-ray. This compression deformity is stable since 12/10/2008.  At T10, there is 50-75% loss of height anteriorly with 50% loss of height posteriorly. Again, no x-ray evidence of posterior bony retropulsion. This compression deformity appears new since 12/10/2008 and also when comparing to a frontal chest radiograph with oblique views of ribs dated 12/09/2013.  IMPRESSION: T5 compression fracture stable since 12/10/2008.  T10 compression fracture appears new since 12/09/2013.   Electronically Signed   By: Misty Stanley M.D.   On: 12/18/2013 15:56   Dg Lumbar Spine Complete  12/18/2013   CLINICAL DATA:  Fall with back pain.  EXAM: LUMBAR SPINE - COMPLETE 4+ VIEW  COMPARISON:  None.  FINDINGS: Four views study shows no evidence for fracture or subluxation involving the lumbar spine. Intervertebral disc spaces are preserved except at L1-2 with there is some mild loss of intervertebral disc height and mild endplate degeneration. Bones are diffusely demineralized.  T10 compression fracture is evident.  IMPRESSION: No evidence for lumbar spine fracture.  Compression  deformity at the  level of T10. Comparing back to a chest x-ray from 12/09/2013, this appears new in the interval.   Electronically Signed   By: Misty Stanley M.D.   On: 12/18/2013 15:53   Ct Head Wo Contrast  12/18/2013   CLINICAL DATA:  Status post fall 8 days ago without known injury possible medication reaction, weakness, back pain.  EXAM: CT HEAD WITHOUT CONTRAST  TECHNIQUE: Contiguous axial images were obtained from the base of the skull through the vertex without intravenous contrast.  COMPARISON:  None.  FINDINGS: There is mild diffuse cerebral and cerebellar atrophy with compensatory ventriculomegaly. There is no shift of the midline. There is no acute intracranial hemorrhage. There is extensive deep white matter hypodensity in both cerebral hemispheres consistent with chronic small vessel ischemia. No acute ischemic changes are demonstrated.  The observed paranasal sinuses and mastoid air cells are clear. There is no acute skull fracture nor cephalohematoma.  IMPRESSION: 1. There is no acute intracranial hemorrhage nor other acute abnormality of the brain. There are extensive changes of chronic small vessel ischemia. 2. There is no acute skull fracture.   Electronically Signed   By: David  Martinique   On: 12/18/2013 15:51    EKG: Independently reviewed.  NSR with prolonged QTc and nonspecific T-wave changes in inferior leads  Assessment/Plan Active Problems:   Thoracic spine fracture  ---  Fall with multiple compression fractures -  PT/OT -  Lumbar corset  -  F/u with neurosurgery -  vicodin 1 tabs po q4-6h prn severe pain -  Ultram as needed for moderate pain -  Morphine for breakthrough pain  COPD, stable, continue spiriva and prn albuterol HTN, stable, continue carvedilol, clonidine, diltiazem, lisinopril.  Hold lasix due to suspected dehydration Hypothyroid, stable, continue levothyroxine Depression/anxiety, stable, continue sertraline  Elevated hemoglobin and thrombocytosis  likely due to dehydration -  IVF -  Repeat CBC in AM  Hypokalemia due to poor oral intake -  Oral potassium repletion  -  Repeat in AM  Diet:  regular Access:  PIV IVF:  yes Proph:  lovenox  Code Status: DNR Family Communication: patient and her daughter.   Disposition Plan: Admit to med-surg.  SW to continue to assist with getting to SNF if possible, otherwise Dana George may need to go back to her ILF with extra assistance until that transition can be facilitated.    Time spent: 60 min Janece Canterbury [facility] Pager [phone]  If 7PM-7AM, please contact night-coverage [email or web address] Password [facility] 12/18/2013, 7:26 PM

## 2013-12-18 NOTE — ED Notes (Signed)
Hospitalist at bedside 

## 2013-12-19 DIAGNOSIS — J441 Chronic obstructive pulmonary disease with (acute) exacerbation: Secondary | ICD-10-CM | POA: Diagnosis not present

## 2013-12-19 DIAGNOSIS — S22009A Unspecified fracture of unspecified thoracic vertebra, initial encounter for closed fracture: Secondary | ICD-10-CM | POA: Diagnosis not present

## 2013-12-19 LAB — BASIC METABOLIC PANEL
Anion gap: 12 (ref 5–15)
BUN: 21 mg/dL (ref 6–23)
CO2: 27 mEq/L (ref 19–32)
Calcium: 9.2 mg/dL (ref 8.4–10.5)
Chloride: 102 mEq/L (ref 96–112)
Creatinine, Ser: 0.74 mg/dL (ref 0.50–1.10)
GFR calc Af Amer: 85 mL/min — ABNORMAL LOW (ref >90)
GFR calc non Af Amer: 74 mL/min — ABNORMAL LOW (ref >90)
GLUCOSE: 122 mg/dL — AB (ref 70–99)
Potassium: 3.7 mEq/L (ref 3.7–5.3)
Sodium: 141 mEq/L (ref 137–147)

## 2013-12-19 LAB — CBC
HCT: 47.6 % — ABNORMAL HIGH (ref 36.0–46.0)
Hemoglobin: 15.5 g/dL — ABNORMAL HIGH (ref 12.0–15.0)
MCH: 31.4 pg (ref 26.0–34.0)
MCHC: 32.6 g/dL (ref 30.0–36.0)
MCV: 96.4 fL (ref 78.0–100.0)
Platelets: 427 10*3/uL — ABNORMAL HIGH (ref 150–400)
RBC: 4.94 MIL/uL (ref 3.87–5.11)
RDW: 16 % — ABNORMAL HIGH (ref 11.5–15.5)
WBC: 8.3 10*3/uL (ref 4.0–10.5)

## 2013-12-19 MED ORDER — BOOST PLUS PO LIQD
237.0000 mL | Freq: Two times a day (BID) | ORAL | Status: DC
  Administered 2013-12-19: 237 mL via ORAL
  Filled 2013-12-19 (×3): qty 237

## 2013-12-19 MED ORDER — POTASSIUM CHLORIDE IN NACL 20-0.9 MEQ/L-% IV SOLN
INTRAVENOUS | Status: AC
  Administered 2013-12-19: 12:00:00 via INTRAVENOUS
  Filled 2013-12-19 (×2): qty 1000

## 2013-12-19 NOTE — Care Management Note (Signed)
    Page 1 of 1   12/19/2013     2:06:56 PM CARE MANAGEMENT NOTE 12/19/2013  Patient:  Dana George, Dana George   Account Number:  [id number]  Date Initiated:  12/19/2013  Documentation initiated by:  [facility]  Subjective/Objective Assessment:   adm:   Back Pain     Action/Plan:   discharge planning   Anticipated DC Date:  12/19/2013   Anticipated DC Plan:  [place]  CM consult      Choice offered to / List presented to:             Status of service:  Completed, signed off Medicare Important Message given?   (If response is "NO", the following Medicare IM given date fields will be blank) Date Medicare IM given:   Medicare IM given by:   Date Additional Medicare IM given:   Additional Medicare IM given by:    Discharge Disposition:  [place]  Per UR Regulation:    If discussed at Long Length of Stay Meetings, dates discussed:    Comments:  12/19/13 14:00 CM texted MD to make aware of OBS status.  A bed is  available at [facility] and DNR on shadow chart (thanks to CSW); if medically stable, discharge appropriate.  No other CM needs were communicated.  Mariane Masters, BSN, CM [phone]. Dellie Catholic, RN Registered Nurse Signed CASE MANAGEMENT Progress Notes Service date: 12/19/2013 12:04 PM 12/19/13 11:30 CM met with pt and daughter, Tobin Chad in room to discuss home health. After a pleasant discussion of benefits of independent living with Private Duty Agency services vs. A higher skilled level at her [place], the daughter and pt agree a higher skilled level would be best.  Both the pt and daughter verbalized understanding a higher skill level at will be private pay.  CM notified CSW to arrange.  No other CM needs were communicated.  Mariane Masters, BSN, CM [phone].

## 2013-12-19 NOTE — Progress Notes (Signed)
Patient Demographics  Dana George, is a 80 y.o. female, DOB - [birth year], PVV:[id number]  Admit date - 12/18/2013   Admitting Physician Janece Canterbury, MD  Outpatient Primary MD for the patient is Precious Reel, MD  LOS - 1   Chief Complaint  Patient presents with  . Back Pain        Subjective:   Dana George today has, No headache, No chest pain, No abdominal pain - No Nausea, No new weakness tingling or numbness, No Cough - SOB. Chr back pain.  Assessment & Plan    1. Multiple falls with back pain. Acute and chronic T-spine fractures along with old healed rib fracture. All falls appear to be mechanical due to poor balance, case was discussed by the ER physician with neurosurgeon on call Dr. Arnoldo Morale who recommended a lumbar/T-spine corsett at to be worn when patient is out of the bed. For now supportive care and follow with neurosurgeon in the office.  Will have PT eval and treat for balance issues and multiple falls, may need SNF placement currently lives in independent living.   2. History of COPD. At baseline no acute issues. Supportive care with nebulizer treatments and oxygen if needed.   3. Hypothyroidism. On home dose Synthroid.   4. Depression. Continue Zoloft.   5. Essential hypertension on combination of Catapres, Coreg, ACE inhibitor and Cardizem. Continue and monitor.   6. Mild polycythemia likely due to hemoconcentration. Hydrate and monitor. Has improved.     Code Status: Full  Family Communication: none present  Disposition Plan: Ass.living vs SNF   Procedures CT head, CT is 5   Consults   N surgery Dr Arnoldo Morale over the phone by EDP   Medications  Scheduled Meds: . aspirin  81 mg Oral q morning - 10a  . carvedilol  12.5 mg Oral BID WC  .  cloNIDine  0.1 mg Oral q morning - 10a  . diltiazem  120 mg Oral Daily  . docusate sodium  100 mg Oral BID  . enoxaparin (LOVENOX) injection  40 mg Subcutaneous Q24H  . levothyroxine  50 mcg Oral QAC breakfast  . lisinopril  20 mg Oral q morning - 10a  . multivitamin with minerals  1 tablet Oral q morning - 10a  . sertraline  50 mg Oral QHS  . tiotropium  18 mcg Inhalation Daily   Continuous Infusions: . 0.9 % NaCl with KCl 20 mEq / L     PRN Meds:.acetaminophen, albuterol, alum & mag hydroxide-simeth, HYDROcodone-acetaminophen, morphine injection, ondansetron (ZOFRAN) IV, polyethylene glycol, traMADol  DVT Prophylaxis  Lovenox   Lab Results  Component Value Date   PLT 427* 12/19/2013    Antibiotics     Anti-infectives   None          Objective:   Filed Vitals:   12/18/13 2144 12/19/13 0509 12/19/13 0831 12/19/13 1050  BP:  150/84 185/69 154/61  Pulse:  63 64   Temp:  97.9 F (36.6 C)    TempSrc:  Oral    Resp:  16    Height: 5\' 2"  (1.575 m)     Weight: 55.112 kg (121 lb 8 oz)     SpO2:  97%      Wt  Readings from Last 3 Encounters:  12/18/13 55.112 kg (121 lb 8 oz)     Intake/Output Summary (Last 24 hours) at 12/19/13 1102 Last data filed at 12/19/13 0940  Gross per 24 hour  Intake 966.67 ml  Output    525 ml  Net 441.67 ml     Physical Exam  Awake Alert, Oriented X 3, No new F.N deficits, Normal affect [place].AT,PERRAL Supple Neck,No JVD, No cervical lymphadenopathy appriciated.  Symmetrical Chest wall movement, Good air movement bilaterally, CTAB RRR,No Gallops,Rubs or new Murmurs, No Parasternal Heave +ve B.Sounds, Abd Soft, No tenderness, No organomegaly appriciated, No rebound - guarding or rigidity. No Cyanosis, Clubbing or edema, No new Rash or bruise     Data Review   Micro Results No results found for this or any previous visit (from the past 240 hour(s)).  Radiology Reports Dg Chest 2 View  12/18/2013   CLINICAL DATA:  Pain post  trauma ; hypertension  EXAM: CHEST  2 VIEW  COMPARISON:  December 09, 2013  FINDINGS: There is no edema or consolidation. Heart size and pulmonary vascularity are normal. No adenopathy. There are surgical clips in the left axillary region. There is evidence of an old fracture of the left lateral seventh rib. There is marked collapse of the T8 vertebral body. There is also collapse of the T3 vertebral body.  IMPRESSION: Collapse of the T3 and T8 vertebral bodies which may be recent. Old healed fracture of left lateral seventh rib. Lungs clear. No pneumothorax.   Electronically Signed   By: Lowella Grip M.D.   On: 12/18/2013 15:48   Dg Ribs Bilateral W/chest  12/09/2013   CLINICAL DATA:  Bilateral rib pain following a fall. History of COPD and mastectomy.  EXAM: BILATERAL RIBS AND CHEST - 4+ VIEW  COMPARISON:  12/10/2008.  FINDINGS: Borderline enlarged cardiac silhouette. The lungs remain mildly hyperexpanded with mildly prominent interstitial markings and mild central peribronchial thickening. Old, healed left seventh rib fracture. No acute rib fracture or pneumothorax seen. Postmastectomy changes on the left with left axillary surgical clips. Diffuse osteopenia.  IMPRESSION: 1. No acute rib fracture or pneumothorax. 2. Stable borderline cardiomegaly and mild changes of COPD and chronic bronchitis.   Electronically Signed   By: Enrique Sack M.D.   On: 12/09/2013 08:47   Dg Thoracic Spine 2 View  12/18/2013   CLINICAL DATA:  Fall.  Back pain.  EXAM: THORACIC SPINE - 2 VIEW  COMPARISON:  Chest x-ray from 9/ 19/15 and 12/10/2008.  FINDINGS: Two view exam of the thoracic spine shows compression deformity at the T6 and T10 vertebral bodies. Compression T5 results in about 50% loss of height anteriorly and 25-50% loss of height posteriorly. No evidence for posterior bony retropulsion by x-ray. This compression deformity is stable since 12/10/2008.  At T10, there is 50-75% loss of height anteriorly with 50% loss  of height posteriorly. Again, no x-ray evidence of posterior bony retropulsion. This compression deformity appears new since 12/10/2008 and also when comparing to a frontal chest radiograph with oblique views of ribs dated 12/09/2013.  IMPRESSION: T5 compression fracture stable since 12/10/2008.  T10 compression fracture appears new since 12/09/2013.   Electronically Signed   By: Misty Stanley M.D.   On: 12/18/2013 15:56   Dg Lumbar Spine Complete  12/18/2013   CLINICAL DATA:  Fall with back pain.  EXAM: LUMBAR SPINE - COMPLETE 4+ VIEW  COMPARISON:  None.  FINDINGS: Four views study shows no evidence for fracture or  subluxation involving the lumbar spine. Intervertebral disc spaces are preserved except at L1-2 with there is some mild loss of intervertebral disc height and mild endplate degeneration. Bones are diffusely demineralized.  T10 compression fracture is evident.  IMPRESSION: No evidence for lumbar spine fracture.  Compression deformity at the level of T10. Comparing back to a chest x-ray from 12/09/2013, this appears new in the interval.   Electronically Signed   By: Misty Stanley M.D.   On: 12/18/2013 15:53   Ct Head Wo Contrast  12/18/2013   CLINICAL DATA:  Status post fall 8 days ago without known injury possible medication reaction, weakness, back pain.  EXAM: CT HEAD WITHOUT CONTRAST  TECHNIQUE: Contiguous axial images were obtained from the base of the skull through the vertex without intravenous contrast.  COMPARISON:  None.  FINDINGS: There is mild diffuse cerebral and cerebellar atrophy with compensatory ventriculomegaly. There is no shift of the midline. There is no acute intracranial hemorrhage. There is extensive deep white matter hypodensity in both cerebral hemispheres consistent with chronic small vessel ischemia. No acute ischemic changes are demonstrated.  The observed paranasal sinuses and mastoid air cells are clear. There is no acute skull fracture nor cephalohematoma.  IMPRESSION:  1. There is no acute intracranial hemorrhage nor other acute abnormality of the brain. There are extensive changes of chronic small vessel ischemia. 2. There is no acute skull fracture.   Electronically Signed   By: David  Martinique   On: 12/18/2013 15:51     CBC  Recent Labs Lab 12/18/13 1621 12/19/13 0404  WBC 6.5 8.3  HGB 17.2* 15.5*  HCT 51.4* 47.6*  PLT 450* 427*  MCV 94.8 96.4  MCH 31.7 31.4  MCHC 33.5 32.6  RDW 15.9* 16.0*  LYMPHSABS 0.9  --   MONOABS 0.9  --   EOSABS 0.1  --   BASOSABS 0.0  --     Chemistries   Recent Labs Lab 12/18/13 1621 12/19/13 0404  NA 144 141  K 3.5* 3.7  CL 100 102  CO2 26 27  GLUCOSE 93 122*  BUN 13 21  CREATININE 0.56 0.74  CALCIUM 10.1 9.2  AST 20  --   ALT 16  --   ALKPHOS 102  --   BILITOT 0.5  --    ------------------------------------------------------------------------------------------------------------------ estimated creatinine clearance is 38.4 ml/min (by C-G formula based on Cr of 0.74). ------------------------------------------------------------------------------------------------------------------ No results found for this basename: HGBA1C,  in the last 72 hours ------------------------------------------------------------------------------------------------------------------ No results found for this basename: CHOL, HDL, LDLCALC, TRIG, CHOLHDL, LDLDIRECT,  in the last 72 hours ------------------------------------------------------------------------------------------------------------------ No results found for this basename: TSH, T4TOTAL, FREET3, T3FREE, THYROIDAB,  in the last 72 hours ------------------------------------------------------------------------------------------------------------------ No results found for this basename: VITAMINB12, FOLATE, FERRITIN, TIBC, IRON, RETICCTPCT,  in the last 72 hours  Coagulation profile No results found for this basename: INR, PROTIME,  in the last 168 hours  No results  found for this basename: DDIMER,  in the last 72 hours  Cardiac Enzymes No results found for this basename: CK, CKMB, TROPONINI, MYOGLOBIN,  in the last 168 hours ------------------------------------------------------------------------------------------------------------------ No components found with this basename: POCBNP,      Time Spent in minutes  35   Samari Gorby K M.D on 12/19/2013 at 11:02 AM  Between 7am to 7pm - Pager - [phone]  After 7pm go to [email or web address] - password [email or web address]  And look for the night coverage person covering for me after hours  [facility] Office  [phone]   **  Disclaimer: This note may have been dictated with voice recognition software. Similar sounding words can inadvertently be transcribed and this note may contain transcription errors which may not have been corrected upon publication of note.**

## 2013-12-19 NOTE — Progress Notes (Signed)
UR completed 

## 2013-12-19 NOTE — Progress Notes (Signed)
12/19/13 11:30 CM met with pt and daughter, Tobin Chad in room to discuss home health. After a pleasant discussion of benefits of independent living with [employer] Duty [employer] services vs. A higher skilled level at her [place], the daughter and pt agree a higher skilled level would be best.  Both the pt and daughter verbalized understanding a higher skill level at will be private pay.  CM notified CSW to arrange.  No other CM needs were communicated.  Mariane Masters, BSN, CM [phone].

## 2013-12-19 NOTE — Evaluation (Signed)
Occupational Therapy Evaluation Patient Details Name: Dana George MRN: [id number] DOB: [birth year] Today's Date: 12/19/2013    History of Present Illness Pt is s/p fall with T10 compression fx.  She has had multiple falls recently   Clinical Impression   This 80 year old female is from independent living.  She has had multiple falls recently and this last one has resulted in T 10 compression fx. She will benefit from skilled OT to increase safety and independence with adls.  Goals in acute are for overall min A level.  Recommend SNF following acute stay  Follow Up Recommendations  SNF    Equipment Recommendations  3 in 1 bedside comode    Recommendations for Other Services       Precautions / Restrictions Precautions Precautions: Back Precaution Comments: lumbar corsett Restrictions Weight Bearing Restrictions: No      Mobility Bed Mobility Overal bed mobility: Needs Assistance Bed Mobility: Rolling;Sidelying to Sit Rolling: Min assist Sidelying to sit: Mod assist       General bed mobility comments: used pad to assist; encouraged log roll and keeping spine straight  Transfers Overall transfer level: Needs assistance Equipment used: Rolling walker (2 wheeled) Transfers: Sit to/from [facility] Sit to Stand: Min assist Stand pivot transfers: Min assist       General transfer comment: assist to rise and steady.  Needs youth size walker    Balance Overall balance assessment: History of Falls                                          ADL Overall ADL's : Needs assistance/impaired     Grooming: Set up;Sitting   Upper Body Bathing: Set up;Sitting   Lower Body Bathing: Sit to/from stand;Moderate assistance   Upper Body Dressing : Minimal assistance;Sitting (iv)   Lower Body Dressing: Total assistance;Sit to/from stand   Toilet Transfer: Minimal assistance;Stand-pivot (to recliner)   Toileting- Clothing Manipulation and  Hygiene: Minimal assistance;Sit to/from stand         General ADL Comments: performed ADL from recliner. Pt able to perform peri care but did not assist with pulling underwear up.  Pt does not recall if dizzy when she fell, but recent falls have been when getting OOB.  Talked to RN about orthostatics.  Did not c/o dizziness when she got up with me.  Pt does have a reacher at home     Vision                     Perception     Praxis      Pertinent Vitals/Pain Pain Assessment: Faces Pain Score: 4  Pain Location: back Pain Intervention(s): Limited activity within patient's tolerance;Monitored during session;Repositioned     Hand Dominance     Extremity/Trunk Assessment Upper Extremity Assessment Upper Extremity Assessment:  (limited to 90 due to pain)           Communication Communication Communication: No difficulties   Cognition Arousal/Alertness: Awake/alert Behavior During Therapy: WFL for tasks assessed/performed Overall Cognitive Status: Within Functional Limits for tasks assessed (? STM )                     General Comments       Exercises       Shoulder Instructions      Home Living Family/patient expects to be discharged  to:: Skilled nursing facility                                        Prior Functioning/Environment Level of Independence: Independent             OT Diagnosis: Generalized weakness   OT Problem List: Decreased strength;Decreased activity tolerance;Impaired balance (sitting and/or standing);Decreased knowledge of use of DME or AE;Pain;Decreased knowledge of precautions   OT Treatment/Interventions: Self-care/ADL training;DME and/or AE instruction;Patient/family education;Balance training;Therapeutic activities    OT Goals(Current goals can be found in the care plan section) Acute Rehab OT Goals Patient Stated Goal: have help she needs OT Goal Formulation: With patient Time For Goal  Achievement: 01/02/14 Potential to Achieve Goals: Good ADL Goals Pt Will Perform Lower Body Bathing: with min assist;sit to/from stand;with adaptive equipment Pt Will Perform Lower Body Dressing: with min assist;with adaptive equipment;sit to/from stand (pants/underwear) Pt Will Transfer to Toilet: with min guard assist;bedside commode;ambulating Additional ADL Goal #1: pt will perform bed mobility with min guard, following back precautions for comfort  OT Frequency: Min 2X/week   Barriers to D/C:            Co-evaluation              End of Session    Activity Tolerance: Patient limited by fatigue Patient left: in bed;with call bell/phone within reach;with family/visitor present   Time: 1884-1660 OT Time Calculation (min): 40 min Charges:  OT General Charges $OT Visit: 1 Procedure OT Evaluation $Initial OT Evaluation Tier I: 1 Procedure OT Treatments $Self Care/Home Management : 23-37 mins G-Codes: OT G-codes **NOT FOR INPATIENT CLASS** Functional Assessment Tool Used: clinical observation and judgment Functional Limitation: Self care Self Care Current Status (Y3016): At least 80 percent but less than 100 percent impaired, limited or restricted Self Care Goal Status (W1093): At least 20 percent but less than 40 percent impaired, limited or restricted  [facility] 12/19/2013, 11:31 AM Lesle Chris, OTR/L [phone] 12/19/2013

## 2013-12-19 NOTE — Progress Notes (Signed)
INITIAL NUTRITION ASSESSMENT  DOCUMENTATION CODES Per approved criteria  -Not Applicable   INTERVENTION: - Boost Plus BID - Discussed importance of protein rich foods/beverages to promote muscle strength - RD to continue to monitor   NUTRITION DIAGNOSIS: Increased nutrient needs related to multiple compression fractures as evidenced by MD notes.   Goal: Pt to consume >90% of meals/supplements  Monitor:  Weights, labs, intake  Reason for Assessment: Malnutrition screening tool   80 y.o. female  Admitting Dx: Back pain  ASSESSMENT: Pt with history of COPD, HTN, hypertension, osteoporosis who presents with fall 8 days ago with persistent back pain and failure to thrive. Found to have multiple compression fractures.   - Pt and daughter in room  - Pt reports for the past week her appetite has been down and she's been eating only 1 meal/day of soup - Before then she was eating 3 meals/day but typically only 50% of her meals - Drinks 1 Boost/day - Denies any problems chewing or swallowing - Family reports pt has lost 50 pounds in the past year - Pt reports eating only some of her eggs this morning and recently ordered a late lunch   Height: Ht Readings from Last 1 Encounters:  12/18/13 5\' 2"  (1.575 m)    Weight: Wt Readings from Last 1 Encounters:  12/18/13 121 lb 8 oz (55.112 kg)    Ideal Body Weight: 110 lbs  % Ideal Body Weight: 110%  Wt Readings from Last 10 Encounters:  12/18/13 121 lb 8 oz (55.112 kg)    Usual Body Weight: 171 lbs per family  % Usual Body Weight: 71%  BMI:  Body mass index is 22.22 kg/(m^2).  Estimated Nutritional Needs: Kcal: 1400-1600 Protein: 65-80g Fluid: 1.4-1.6L/day  Skin: intact   Diet Order: General  EDUCATION NEEDS: -No education needs identified at this time   Intake/Output Summary (Last 24 hours) at 12/19/13 1249 Last data filed at 12/19/13 1000  Gross per 24 hour  Intake 1766.67 ml  Output    925 ml  Net  841.67 ml    Last BM: 9/28  Labs:   Recent Labs Lab 12/18/13 1621 12/19/13 0404  NA 144 141  K 3.5* 3.7  CL 100 102  CO2 26 27  BUN 13 21  CREATININE 0.56 0.74  CALCIUM 10.1 9.2  GLUCOSE 93 122*    CBG (last 3)  No results found for this basename: GLUCAP,  in the last 72 hours  Scheduled Meds: . aspirin  81 mg Oral q morning - 10a  . carvedilol  12.5 mg Oral BID WC  . cloNIDine  0.1 mg Oral q morning - 10a  . diltiazem  120 mg Oral Daily  . docusate sodium  100 mg Oral BID  . enoxaparin (LOVENOX) injection  40 mg Subcutaneous Q24H  . levothyroxine  50 mcg Oral QAC breakfast  . lisinopril  20 mg Oral q morning - 10a  . multivitamin with minerals  1 tablet Oral q morning - 10a  . sertraline  50 mg Oral QHS  . tiotropium  18 mcg Inhalation Daily    Continuous Infusions: . 0.9 % NaCl with KCl 20 mEq / L 100 mL/hr at 12/19/13 1136    Past Medical History  Diagnosis Date  . Hypothyroidism   . Hypertension   . Shortness of breath   . COPD (chronic obstructive pulmonary disease)   . Osteoporosis   . Cancer 1980    Breast cancer  . TIA (transient ischemic  attack)     Past Surgical History  Procedure Laterality Date  . Abdominal hysterectomy    . Total knee arthroplasty  2010    left knee  . Mastectomy  1980    left side  . Tonsillectomy    . Exploratory laparotomy w/ bowel resection      10/19/12-11/01/12 [facility] for exploratory laparotomty with extensive lysis of adhesion and segmental small bowel resection with end to end anastomosis per Dr. Isidore Moos 10/22/12  . Cataract extraction, bilateral      Carlis Stable MS, RD, LDN [phone] Pager [phone] Weekend/After Hours Pager

## 2013-12-19 NOTE — Progress Notes (Signed)
PT Cancellation Note  Patient Details Name: Dana George MRN: [id number] DOB: 1923/11/26   Cancelled Treatment:    Reason Eval/Treat Not Completed: family requested that PT allow pt to rest at this time. Spoke with CSW and she will notify therapy if PT eval is absolutely needed today. Otherwise, will check back on tomorrow. Thanks.    Weston Anna, MPT Pager: [phone]

## 2013-12-19 NOTE — Progress Notes (Signed)
CSW met with pt / daughter this afternoon to assist with d/c planning. Pt  plans to return to [facility] ( SNF ) upon d/c. [facility] will have a bed available when pt is ready for d/c. Pt/ family are aware that insurance will not cover SNF placement. They are prepared to pay out of pocket for placement. CSW will continue to assist with d/c planning to SNF.  Werner Lean LCSW [phone]

## 2013-12-20 DIAGNOSIS — IMO0002 Reserved for concepts with insufficient information to code with codable children: Secondary | ICD-10-CM | POA: Diagnosis not present

## 2013-12-20 DIAGNOSIS — S22009A Unspecified fracture of unspecified thoracic vertebra, initial encounter for closed fracture: Secondary | ICD-10-CM | POA: Diagnosis not present

## 2013-12-20 DIAGNOSIS — J441 Chronic obstructive pulmonary disease with (acute) exacerbation: Secondary | ICD-10-CM | POA: Diagnosis not present

## 2013-12-20 DIAGNOSIS — J9600 Acute respiratory failure, unspecified whether with hypoxia or hypercapnia: Secondary | ICD-10-CM | POA: Diagnosis not present

## 2013-12-20 LAB — URINE CULTURE
Colony Count: NO GROWTH
Culture: NO GROWTH

## 2013-12-20 LAB — CBC
HCT: 45.9 % (ref 36.0–46.0)
Hemoglobin: 15.2 g/dL — ABNORMAL HIGH (ref 12.0–15.0)
MCH: 32 pg (ref 26.0–34.0)
MCHC: 33.1 g/dL (ref 30.0–36.0)
MCV: 96.6 fL (ref 78.0–100.0)
PLATELETS: 419 10*3/uL — AB (ref 150–400)
RBC: 4.75 MIL/uL (ref 3.87–5.11)
RDW: 16 % — AB (ref 11.5–15.5)
WBC: 6.1 10*3/uL (ref 4.0–10.5)

## 2013-12-20 MED ORDER — HYDROCODONE-ACETAMINOPHEN 5-325 MG PO TABS: 1.0000 | ORAL_TABLET | Freq: Four times a day (QID) | ORAL | Status: DC | PRN

## 2013-12-20 NOTE — Discharge Summary (Signed)
Dana George, is a 80 y.o. female  DOB 08-Sep-1923  MRN [id number].  Admission date:  12/18/2013  Admitting Physician  Janece Canterbury, MD  Discharge Date:  12/20/2013   Primary MD  Precious Reel, MD  Recommendations for primary care physician for things to follow:    Follow clinically. Must follow with neurosurgery in the next 5-7 days.   Admission Diagnosis  COPD exacerbation [491.21] Essential hypertension [401.9] Thoracic spine fracture, closed, initial encounter [805.2]   Discharge Diagnosis  COPD exacerbation [491.21] Essential hypertension [401.9] Thoracic spine fracture, closed, initial encounter [805.2]     Active Problems:   TIA (transient ischemic attack)   HTN (hypertension)   Unspecified hypothyroidism   Thoracic spine fracture      Past Medical History  Diagnosis Date  . Hypothyroidism   . Hypertension   . Shortness of breath   . COPD (chronic obstructive pulmonary disease)   . Osteoporosis   . Cancer 1980    Breast cancer  . TIA (transient ischemic attack)     Past Surgical History  Procedure Laterality Date  . Abdominal hysterectomy    . Total knee arthroplasty  2010    left knee  . Mastectomy  1980    left side  . Tonsillectomy    . Exploratory laparotomy w/ bowel resection      10/19/12-11/01/12 [facility] for exploratory laparotomty with extensive lysis of adhesion and segmental small bowel resection with end to end anastomosis per Dr. Isidore Moos 10/22/12  . Cataract extraction, bilateral         History of present illness and  Hospital Course:     Kindly see H&P for history of present illness and admission details, please review complete Labs, Consult reports and Test reports for all details in brief  HPI  from the history and physical done on the day of admission   The patient is  a 80 y.o. year-old female with history of COPD, HTN, hypertension, osteoporosis who presents with fall 8 days ago with persistent back pain and failure to thrive. She was seen in the ER the same day and had XR of her ribs which demonstrated no fractures. She was seen by PT/OT who established she was able to ambulate with her walker and she was discharged back to [place] with a prescription for vicodin. She had clinical improvement for the first 3-4 days, but was increasingly confused while using the vicodin. About a week ago, she started lying in bed. She was only able to get up to the bedside commode with assistance and she lost her appetite. She has been eating and drinking very little. Her daughters stopped her vicodin, thinking that may be contributing to her weakness and they used tylenol and ibuprofen instead, hwoever, these medications have not been relieving her 6/10 mid-thoracic back pain. Movement makes her pain worse, lying still or taking vicodin helps. Her family had her brought back to the ER because she has become progressively weaker.    In the ER,  labs notable for hemoglobin 17.2, platelets 450 consistent with dehydration. CT head without acute abnl. CXR with suggestion of new T3, T8 compression fractures but without pneumonia or PTX or acute rib fractures. Thoracic and lumbar spine XR showed an old T5 fracture and a new compression fracture of T10. ER MD spoke with neurosurgeon on call who recommended she be placed in a lumbar corset and follow up with neurosurgery as outpatient. She is being admitted for pain control and dehydration.     Hospital Course    1. Multiple falls with back pain. Acute and chronic T-spine fractures along with old healed rib fracture. All falls appear to be mechanical due to poor balance, case was discussed by the ER physician with neurosurgeon on call Dr. Arnoldo Morale who recommended a lumbar/T-spine corsett at to be worn when patient is out of the bed and  followup with him in the office. Seen by PT OT will qualify for SNF for continued help.    2. History of COPD. At baseline no acute issues. Supportive care with nebulizer treatments and oxygen if needed.     3. Hypothyroidism. On home dose Synthroid.     4. Depression. Continue Zoloft.     5. Essential hypertension on combination of Catapres, Coreg, ACE inhibitor and Cardizem. Continue and monitor.     6. Mild polycythemia likely due to hemoconcentration. Has improved after hydration. She was not eating or drinking well due to back pain prior to admission. Now close to baseline.     Request primary M.D. in SNF M.D. to monitor her BMP closely. If she becomes dehydrated again she may need to come off of her Lasix.     Discharge Condition: stable   Follow UP  Follow-up Information   Follow up with Precious Reel, MD. Schedule an appointment as soon as possible for a visit in 1 week.   Specialty:  Internal Medicine   Contact information:   [address] [place] [address] [phone]       Follow up with Ophelia Charter, MD. Schedule an appointment as soon as possible for a visit in 1 week.   Specialty:  Neurosurgery   Contact information:   [address] [place] [address] [phone]         Discharge Instructions  and  Discharge Medications      Discharge Instructions   Diet - low sodium heart healthy    Complete by:  As directed      Discharge instructions    Complete by:  As directed   Follow with Primary MD Precious Reel, MD in 7 days   Get CBC, CMP, 2 view Chest X ray checked  by Primary MD next visit.    Activity: As tolerated with Full fall precautions use walker/cane & assistance as needed.  Wear the Back brace when out of the bed.   Disposition SNF   Diet: Heart Healthy   For Heart failure patients - Check your Weight same time everyday, if you gain over 2 pounds, or you develop in leg swelling, experience  more shortness of breath or chest pain, call your Primary MD immediately. Follow Cardiac Low Salt Diet and 1.8 lit/day fluid restriction.   On your next visit with her primary care physician please Get Medicines reviewed and adjusted.  Please request your Prim.MD to go over all Hospital Tests and Procedure/Radiological results at the follow up, please get all Hospital records sent to your Prim MD by signing hospital  release before you go home.   If you experience worsening of your admission symptoms, develop shortness of breath, life threatening emergency, suicidal or homicidal thoughts you must seek medical attention immediately by calling 911 or calling your MD immediately  if symptoms less severe.  You Must read complete instructions/literature along with all the possible adverse reactions/side effects for all the Medicines you take and that have been prescribed to you. Take any new Medicines after you have completely understood and accpet all the possible adverse reactions/side effects.   Do not drive, operating heavy machinery, perform activities at heights, swimming or participation in water activities or provide baby sitting services if your were admitted for syncope or siezures until you have seen by Primary MD or a Neurologist and advised to do so again.  Do not drive when taking Pain medications.    Do not take more than prescribed Pain, Sleep and Anxiety Medications  Special Instructions: If you have smoked or chewed Tobacco  in the last 2 yrs please stop smoking, stop any regular Alcohol  and or any Recreational drug use.  Wear Seat belts while driving.   Please note  You were cared for by a hospitalist during your hospital stay. If you have any questions about your discharge medications or the care you received while you were in the hospital after you are discharged, you can call the unit and asked to speak with the hospitalist on call if the hospitalist that took care of you  is not available. Once you are discharged, your primary care physician will handle any further medical issues. Please note that NO REFILLS for any discharge medications will be authorized once you are discharged, as it is imperative that you return to your primary care physician (or establish a relationship with a primary care physician if you do not have one) for your aftercare needs so that they can reassess your need for medications and monitor your lab values.     Increase activity slowly    Complete by:  As directed             Medication List    STOP taking these medications       ibuprofen 200 MG tablet  Commonly known as:  ADVIL,MOTRIN      TAKE these medications       acetaminophen 325 MG tablet  Commonly known as:  TYLENOL  Take 650 mg by mouth every 4 (four) hours as needed for moderate pain.     aspirin 81 MG chewable tablet  Chew 81 mg by mouth every morning.     carvedilol 12.5 MG tablet  Commonly known as:  COREG  Take 12.5 mg by mouth 2 (two) times daily with a meal.     cloNIDine 0.1 MG tablet  Commonly known as:  CATAPRES  Take 0.1 mg by mouth every morning.     diltiazem 120 MG 24 hr capsule  Commonly known as:  CARDIZEM CD  Take 120 mg by mouth daily.     docusate sodium 100 MG capsule  Commonly known as:  COLACE  Take 1 capsule (100 mg total) by mouth 2 (two) times daily.     furosemide 20 MG tablet  Commonly known as:  LASIX  Take 10 mg by mouth every other day.     HYDROcodone-acetaminophen 5-325 MG per tablet  Commonly known as:  NORCO/VICODIN  Take 1 tablet by mouth every 6 (six) hours as needed.     levothyroxine 50  MCG tablet  Commonly known as:  SYNTHROID, LEVOTHROID  Take 50 mcg by mouth every morning.     lisinopril 40 MG tablet  Commonly known as:  PRINIVIL,ZESTRIL  Take 20 mg by mouth every morning.     Melatonin 3 MG Tbdp  Take 3 mg by mouth at bedtime.     multivitamin with minerals Tabs tablet  Take 1 tablet by mouth  every morning.     polyethylene glycol packet  Commonly known as:  MIRALAX / GLYCOLAX  Take 17 g by mouth daily as needed.     sertraline 50 MG tablet  Commonly known as:  ZOLOFT  Take 50 mg by mouth at bedtime.     tiotropium 18 MCG inhalation capsule  Commonly known as:  SPIRIVA  Place 18 mcg into inhaler and inhale daily.          Diet and Activity recommendation: See Discharge Instructions above   Consults obtained - neurosurgery over the phone   Major procedures and Radiology Reports - PLEASE review detailed and final reports for all details, in brief -       Dg Chest 2 View  12/18/2013   CLINICAL DATA:  Pain post trauma ; hypertension  EXAM: CHEST  2 VIEW  COMPARISON:  December 09, 2013  FINDINGS: There is no edema or consolidation. Heart size and pulmonary vascularity are normal. No adenopathy. There are surgical clips in the left axillary region. There is evidence of an old fracture of the left lateral seventh rib. There is marked collapse of the T8 vertebral body. There is also collapse of the T3 vertebral body.  IMPRESSION: Collapse of the T3 and T8 vertebral bodies which may be recent. Old healed fracture of left lateral seventh rib. Lungs clear. No pneumothorax.   Electronically Signed   By: Lowella Grip M.D.   On: 12/18/2013 15:48      Dg Thoracic Spine 2 View  12/18/2013   CLINICAL DATA:  Fall.  Back pain.  EXAM: THORACIC SPINE - 2 VIEW  COMPARISON:  Chest x-ray from 9/ 19/15 and 12/10/2008.  FINDINGS: Two view exam of the thoracic spine shows compression deformity at the T6 and T10 vertebral bodies. Compression T5 results in about 50% loss of height anteriorly and 25-50% loss of height posteriorly. No evidence for posterior bony retropulsion by x-ray. This compression deformity is stable since 12/10/2008.  At T10, there is 50-75% loss of height anteriorly with 50% loss of height posteriorly. Again, no x-ray evidence of posterior bony retropulsion. This  compression deformity appears new since 12/10/2008 and also when comparing to a frontal chest radiograph with oblique views of ribs dated 12/09/2013.  IMPRESSION: T5 compression fracture stable since 12/10/2008.  T10 compression fracture appears new since 12/09/2013.   Electronically Signed   By: Misty Stanley M.D.   On: 12/18/2013 15:56   Dg Lumbar Spine Complete  12/18/2013   CLINICAL DATA:  Fall with back pain.  EXAM: LUMBAR SPINE - COMPLETE 4+ VIEW  COMPARISON:  None.  FINDINGS: Four views study shows no evidence for fracture or subluxation involving the lumbar spine. Intervertebral disc spaces are preserved except at L1-2 with there is some mild loss of intervertebral disc height and mild endplate degeneration. Bones are diffusely demineralized.  T10 compression fracture is evident.  IMPRESSION: No evidence for lumbar spine fracture.  Compression deformity at the level of T10. Comparing back to a chest x-ray from 12/09/2013, this appears new in the interval.   Electronically  Signed   By: Misty Stanley M.D.   On: 12/18/2013 15:53   Ct Head Wo Contrast  12/18/2013   CLINICAL DATA:  Status post fall 8 days ago without known injury possible medication reaction, weakness, back pain.  EXAM: CT HEAD WITHOUT CONTRAST  TECHNIQUE: Contiguous axial images were obtained from the base of the skull through the vertex without intravenous contrast.  COMPARISON:  None.  FINDINGS: There is mild diffuse cerebral and cerebellar atrophy with compensatory ventriculomegaly. There is no shift of the midline. There is no acute intracranial hemorrhage. There is extensive deep white matter hypodensity in both cerebral hemispheres consistent with chronic small vessel ischemia. No acute ischemic changes are demonstrated.  The observed paranasal sinuses and mastoid air cells are clear. There is no acute skull fracture nor cephalohematoma.  IMPRESSION: 1. There is no acute intracranial hemorrhage nor other acute abnormality of the  brain. There are extensive changes of chronic small vessel ischemia. 2. There is no acute skull fracture.   Electronically Signed   By: David  Martinique   On: 12/18/2013 15:51    Micro Results      No results found for this or any previous visit (from the past 240 hour(s)).     Today   Subjective:   Dana George today has no headache,no chest abdominal pain,no new weakness tingling or numbness, back pain is better.  Objective:   Blood pressure 149/60, pulse 65, temperature 98.9 F (37.2 C), temperature source Oral, resp. rate 16, height 5\' 2"  (1.575 m), weight 55.112 kg (121 lb 8 oz), SpO2 95.00%.   Intake/Output Summary (Last 24 hours) at 12/20/13 0732 Last data filed at 12/20/13 0724  Gross per 24 hour  Intake   2452 ml  Output   1700 ml  Net    752 ml    Exam Awake Alert, Oriented x 3, No new F.N deficits, Normal affect [place].AT,PERRAL Supple Neck,No JVD, No cervical lymphadenopathy appriciated.  Symmetrical Chest wall movement, Good air movement bilaterally, CTAB RRR,No Gallops,Rubs or new Murmurs, No Parasternal Heave +ve B.Sounds, Abd Soft, Non tender, No organomegaly appriciated, No rebound -guarding or rigidity. No Cyanosis, Clubbing or edema, No new Rash or bruise  Data Review   CBC w Diff: Lab Results  Component Value Date   WBC 6.1 12/20/2013   WBC 4.0 11/22/2012   HGB 15.2* 12/20/2013   HCT 45.9 12/20/2013   PLT 419* 12/20/2013   LYMPHOPCT 14 12/18/2013   MONOPCT 13* 12/18/2013   EOSPCT 2 12/18/2013   BASOPCT 0 12/18/2013    CMP: Lab Results  Component Value Date   NA 141 12/19/2013   NA 139 01/09/2013   K 3.7 12/19/2013   CL 102 12/19/2013   CO2 27 12/19/2013   BUN 21 12/19/2013   BUN 15 01/09/2013   CREATININE 0.74 12/19/2013   CREATININE 0.6 01/09/2013   GLU 113 12/01/2012   PROT 7.5 12/18/2013   ALBUMIN 4.4 12/18/2013   BILITOT 0.5 12/18/2013   ALKPHOS 102 12/18/2013   AST 20 12/18/2013   ALT 16 12/18/2013  .   Total Time in preparing paper work,  data evaluation and todays exam - 35 minutes  Thurnell Lose M.D on 12/20/2013 at 7:32 AM  [facility] Office  [phone]   **Disclaimer: This note may have been dictated with voice recognition software. Similar sounding words can inadvertently be transcribed and this note may contain transcription errors which may not have been corrected upon publication of note.**

## 2013-12-20 NOTE — Progress Notes (Signed)
Pt / family are inagreement with d/c to SNF today. [facility] transport required. NSG reviewed d/c summary, scripts, avs. Scripts included in d/c packet.  Werner Lean LCSW [phone]

## 2013-12-20 NOTE — Progress Notes (Signed)
[facility] to [facility], assessment unchanged with daughter at bedside  [place]

## 2013-12-20 NOTE — Discharge Instructions (Signed)
Follow with Primary MD Dana Reel, MD in 7 days   Get CBC, CMP, 2 view Chest X ray checked  by Primary MD next visit.    Activity: As tolerated with Full fall precautions use walker/cane & assistance as needed.  Wear the Back brace when out of the bed.   Disposition SNF   Diet: Heart Healthy   For Heart failure patients - Check your Weight same time everyday, if you gain over 2 pounds, or you develop in leg swelling, experience more shortness of breath or chest pain, call your Primary MD immediately. Follow Cardiac Low Salt Diet and 1.8 lit/day fluid restriction.   On your next visit with her primary care physician please Get Medicines reviewed and adjusted.  Please request your Prim.MD to go over all Hospital Tests and Procedure/Radiological results at the follow up, please get all Hospital records sent to your Prim MD by signing hospital release before you go home.   If you experience worsening of your admission symptoms, develop shortness of breath, life threatening emergency, suicidal or homicidal thoughts you must seek medical attention immediately by calling 911 or calling your MD immediately  if symptoms less severe.  You Must read complete instructions/literature along with all the possible adverse reactions/side effects for all the Medicines you take and that have been prescribed to you. Take any new Medicines after you have completely understood and accpet all the possible adverse reactions/side effects.   Do not drive, operating heavy machinery, perform activities at heights, swimming or participation in water activities or provide baby sitting services if your were admitted for syncope or siezures until you have seen by Primary MD or a Neurologist and advised to do so again.  Do not drive when taking Pain medications.    Do not take more than prescribed Pain, Sleep and Anxiety Medications  Special Instructions: If you have smoked or chewed Tobacco  in the last 2 yrs  please stop smoking, stop any regular Alcohol  and or any Recreational drug use.  Wear Seat belts while driving.   Please note  You were cared for by a hospitalist during your hospital stay. If you have any questions about your discharge medications or the care you received while you were in the hospital after you are discharged, you can call the unit and asked to speak with the hospitalist on call if the hospitalist that took care of you is not available. Once you are discharged, your primary care physician will handle any further medical issues. Please note that NO REFILLS for any discharge medications will be authorized once you are discharged, as it is imperative that you return to your primary care physician (or establish a relationship with a primary care physician if you do not have one) for your aftercare needs so that they can reassess your need for medications and monitor your lab values.

## 2013-12-20 NOTE — Evaluation (Signed)
Physical Therapy Evaluation Patient Details Name: Dana George MRN: [id number] DOB: [birth year] Today's Date: 12/20/2013   History of Present Illness  Pt is s/p fall with T10 compression fx.  She has had multiple falls recently; PMHx: COPD, HTN  Clinical Impression  Pt will benefit from PT to address deficits below; Pt is limited by pain and fatigues quickly at time of this eval; Will benefit from SNF/incr level of  care post acute    Follow Up Recommendations SNF;Supervision/Assistance - 24 hour    Equipment Recommendations  None recommended by PT    Recommendations for Other Services       Precautions / Restrictions Precautions Precautions: Back Precaution Comments: corsett is rather large for pt, adjusted this session and educated pt on how to tighten it (requires total assist to don corsett) Required Braces or Orthoses: Other Brace/Splint Other Brace/Splint: lumbar corsett Restrictions Weight Bearing Restrictions: No      Mobility  Bed Mobility Overal bed mobility: Needs Assistance Bed Mobility: Rolling;Sit to Sidelying Rolling: Min guard       Sit to sidelying: Mod assist General bed mobility comments: cues for log roll/sidelying position, required assist with UB and LEs onto bed  Transfers Overall transfer level: Needs assistance Equipment used: Rolling walker (2 wheeled) Transfers: Sit to/from Stand Sit to Stand: Min assist         General transfer comment: assist to rise and steady, cues for hand placement  Ambulation/Gait Ambulation/Gait assistance: Min assist;Min guard Ambulation Distance (Feet): 15 Feet Assistive device: Rolling walker (2 wheeled) Gait Pattern/deviations: Step-to pattern;Trunk flexed     General Gait Details: cues for RW position from self  Stairs            Wheelchair Mobility    Modified Rankin (Stroke Patients Only)       Balance Overall balance assessment: History of Falls;Needs assistance            Standing balance-Leahy Scale: Poor Standing balance comment: requires UE support (RW) to maintain balance                             Pertinent Vitals/Pain Pain Assessment: Faces Faces Pain Scale: Hurts little more Pain Location: back Pain Intervention(s): Limited activity within patient's tolerance;Monitored during session;Heat applied;Repositioned    Home Living Family/patient expects to be discharged to:: Skilled nursing facility                      Prior Function Level of Independence: Independent         Comments: pt was in [facility] living prior to adm but had care giver coming a few hrs ~5days per wk;      Hand Dominance        Extremity/Trunk Assessment   Upper Extremity Assessment: Defer to OT evaluation           Lower Extremity Assessment: Generalized weakness      Cervical / Trunk Assessment: Kyphotic  Communication   Communication: No difficulties  Cognition Arousal/Alertness: Awake/alert Behavior During Therapy: WFL for tasks assessed/performed Overall Cognitive Status: Within Functional Limits for tasks assessed (no family present to determine baseline)                      General Comments      Exercises        Assessment/Plan    PT Assessment Patient needs continued PT services  PT Diagnosis  Difficulty walking;Generalized weakness   PT Problem List Decreased strength;Decreased activity tolerance;Decreased balance;Decreased mobility;Decreased knowledge of use of DME  PT Treatment Interventions DME instruction;Gait training;Functional mobility training;Therapeutic activities;Therapeutic exercise;Balance training;Patient/family education   PT Goals (Current goals can be found in the Care Plan section) Acute Rehab PT Goals Patient Stated Goal: have help she needs PT Goal Formulation: With patient Time For Goal Achievement: 12/22/13 Potential to Achieve Goals: Good    Frequency Min 3X/week   Barriers to  discharge        Co-evaluation               End of Session Equipment Utilized During Treatment: Gait belt Activity Tolerance: Patient tolerated treatment well;Patient limited by pain Patient left: with call bell/phone within reach;in bed      Functional Assessment Tool Used: clinical judgement Functional Limitation: Mobility: Walking and moving around Mobility: Walking and Moving Around Current Status ([id number]): At least 1 percent but less than 20 percent impaired, limited or restricted Mobility: Walking and Moving Around Goal Status [phone]): At least 1 percent but less than 20 percent impaired, limited or restricted    Time: 0915-0931 PT Time Calculation (min): 16 min   Charges:     PT Treatments $Gait Training: 8-22 mins   PT G Codes:   Functional Assessment Tool Used: clinical judgement Functional Limitation: Mobility: Walking and moving around    [facility] 12/20/2013, 11:41 AM

## 2013-12-21 DIAGNOSIS — I10 Essential (primary) hypertension: Secondary | ICD-10-CM | POA: Diagnosis not present

## 2013-12-21 DIAGNOSIS — D649 Anemia, unspecified: Secondary | ICD-10-CM | POA: Diagnosis not present

## 2013-12-25 ENCOUNTER — Other Ambulatory Visit: Payer: Self-pay | Admitting: *Deleted

## 2013-12-25 DIAGNOSIS — R0602 Shortness of breath: Secondary | ICD-10-CM | POA: Diagnosis not present

## 2013-12-25 MED ORDER — HYDROCODONE-ACETAMINOPHEN 5-325 MG PO TABS: ORAL_TABLET | ORAL | Status: DC

## 2013-12-25 NOTE — Telephone Encounter (Signed)
[facility] 

## 2013-12-26 ENCOUNTER — Encounter: Payer: Self-pay | Admitting: Adult Health

## 2013-12-26 ENCOUNTER — Non-Acute Institutional Stay (SKILLED_NURSING_FACILITY): Payer: Medicare Other | Admitting: Adult Health

## 2013-12-26 DIAGNOSIS — F32A Depression, unspecified: Secondary | ICD-10-CM

## 2013-12-26 DIAGNOSIS — F329 Major depressive disorder, single episode, unspecified: Secondary | ICD-10-CM | POA: Diagnosis not present

## 2013-12-26 DIAGNOSIS — R609 Edema, unspecified: Secondary | ICD-10-CM | POA: Diagnosis not present

## 2013-12-26 DIAGNOSIS — I10 Essential (primary) hypertension: Secondary | ICD-10-CM

## 2013-12-26 DIAGNOSIS — J441 Chronic obstructive pulmonary disease with (acute) exacerbation: Secondary | ICD-10-CM | POA: Diagnosis not present

## 2013-12-26 DIAGNOSIS — S22009S Unspecified fracture of unspecified thoracic vertebra, sequela: Secondary | ICD-10-CM

## 2013-12-26 HISTORY — DX: Depression, unspecified: F32.A

## 2013-12-26 NOTE — Progress Notes (Signed)
Patient ID: Dana George, female   DOB: November 12, 1923, 80 y.o.   MRN: [id number]     [facility]   No Known Allergies   Chief Complaint  Patient presents with  . Hospitalization Follow-up    HPI:  She had a fall at home and suffered a thoracic spine T10 compression fracture. She is here for short term rehab with her goal to return back home after completing her rehab. Her pain presently under control with her only concern is a dry mouth.    Past Medical History  Diagnosis Date  . Hypothyroidism   . Hypertension   . Shortness of breath   . COPD (chronic obstructive pulmonary disease)   . Osteoporosis   . Cancer 1980    Breast cancer  . TIA (transient ischemic attack)     Past Surgical History  Procedure Laterality Date  . Abdominal hysterectomy    . Total knee arthroplasty  2010    left knee  . Mastectomy  1980    left side  . Tonsillectomy    . Exploratory laparotomy w/ bowel resection      10/19/12-11/01/12 [facility] for exploratory laparotomty with extensive lysis of adhesion and segmental small bowel resection with end to end anastomosis per Dr. Isidore George 10/22/12  . Cataract extraction, bilateral      VITAL SIGNS BP 129/87  Pulse 69  Ht 5\' 2"  (1.575 m)  Wt 121 lb (54.885 kg)  BMI 22.13 kg/m2   Patient's Medications  New Prescriptions   No medications on file  Previous Medications   ACETAMINOPHEN (TYLENOL) 325 MG TABLET    Take 650 mg by mouth every 4 (four) hours as needed for moderate pain.   ASPIRIN 81 MG CHEWABLE TABLET    Chew 81 mg by mouth every morning.   CARVEDILOL (COREG) 12.5 MG TABLET    Take 12.5 mg by mouth 2 (two) times daily with a meal.   CLONIDINE (CATAPRES) 0.1 MG TABLET    Take 0.1 mg by mouth every morning.    DILTIAZEM (CARDIZEM CD) 120 MG 24 HR CAPSULE    Take 120 mg by mouth daily.    DOCUSATE SODIUM (COLACE) 100 MG CAPSULE    Take 1 capsule (100 mg total) by mouth 2 (two) times daily.   FUROSEMIDE (LASIX) 20 MG TABLET     Take 10 mg by mouth every other day.    HYDROCODONE-ACETAMINOPHEN (NORCO/VICODIN) 5-325 MG PER TABLET    Take one tablet by mouth every 6 hours as needed for pain   LEVOTHYROXINE (SYNTHROID, LEVOTHROID) 50 MCG TABLET    Take 50 mcg by mouth every morning.    LISINOPRIL (PRINIVIL,ZESTRIL) 40 MG TABLET    Take 20 mg by mouth every morning.    MELATONIN 3 MG TBDP    Take 3 mg by mouth at bedtime.   MULTIPLE VITAMIN (MULTIVITAMIN WITH MINERALS) TABS TABLET    Take 1 tablet by mouth every morning.   POLYETHYLENE GLYCOL (MIRALAX / GLYCOLAX) PACKET    Take 17 g by mouth daily as needed.    SERTRALINE (ZOLOFT) 50 MG TABLET    Take 50 mg by mouth at bedtime.   TIOTROPIUM (SPIRIVA) 18 MCG INHALATION CAPSULE    Place 18 mcg into inhaler and inhale daily.  Modified Medications   No medications on file  Discontinued Medications   No medications on file    SIGNIFICANT DIAGNOSTIC EXAMS  12-18-13: lumbar spine x-ray: No evidence for lumbar spine fracture.  Compression deformity at the level of T10. Comparing back to a chest x-ray from 12/09/2013, this appears new in the interval.  12-18-13: thoracic spine x-ray: T5 compression fracture stable since 12/10/2008. T10 compression fracture appears new since 12/09/2013.  12-18-13: chest x-ray; Collapse of the T3 and T8 vertebral bodies which may be recent. Old healed fracture of left lateral seventh rib. Lungs clear. No pneumothorax.  12-18-13: ct of head: 1. There is no acute intracranial hemorrhage nor other acute abnormality of the brain. There are extensive changes of chronic small vessel ischemia. 2. There is no acute skull fracture.    LABS REVIEWED:   12-18-13: wbc 6.5; hgb 17.2; hct 51.4 ;mcv 94.8; plt 450; glucose 93; bun 13; creat 0.56; k+3.5; na++144; liver normal albumin 4.4 12-19-13: glucose 122; bun 21; creat 0.74; k+3.7; na++ 141 12-20-13: wbc 6.1; hgb 15.2; hct 45.9; mcv 96.6; plt 419  12-21-13: wbc 5.4; hgb 14.0; hct 44.6 ;mcv 97.9; plt 336; glucose  13; bun 17; creat 0.3; k+4.4; na++141; liver normal albumin 3.2      Review of Systems  Constitutional: Negative for malaise/fatigue.  HENT:       Has dry mouth   Respiratory: Negative for cough and shortness of breath.   Cardiovascular: Negative for chest pain, palpitations and leg swelling.  Gastrointestinal: Negative for heartburn, abdominal pain and constipation.  Musculoskeletal: Negative for back pain and myalgias.  Skin: Negative.   Psychiatric/Behavioral: Negative for depression. The patient is not nervous/anxious.      Physical Exam  Constitutional: She is oriented to person, place, and time. She appears well-developed and well-nourished. No distress.  thin  Neck: Neck supple. No JVD present. No thyromegaly present.  Cardiovascular: Normal rate, regular rhythm and intact distal pulses.   Respiratory: Effort normal and breath sounds normal. No respiratory distress. She has no wheezes.  GI: Soft. Bowel sounds are normal. She exhibits no distension. There is no tenderness.  Musculoskeletal: She exhibits no edema.  Is able to move all extremities   Neurological: She is alert and oriented to person, place, and time.  Skin: Skin is warm and dry. She is not diaphoretic.       ASSESSMENT/ PLAN:  1. COPD: she is presently stable; will continue spiriva daily and will monitor her status   2. Hypertension: will continue coreg 12.5 mg twice daily clonidine 0.1 mg daily; cardizem cd 120 mg daily lisinopril 20 mg daily asa 81 mg daily and will monitor  3. Edema: will continue lasix 10 mg every other day   4. Hypothyroidism: will continue synthroid 50 mcg daily   5. Depression: she is stable will continue zoloft 50 mg daily   6. Thoracic spine fracture status post fall: will continue therapy as directed; to improve upon her strength; mobility; gait and independence with her adl's.  Will continue her vicodin 5/325 mg every 6 hours as needed. Will monitor her status   7. Dry  mouth: will use biotene as needed and keep at bedside.

## 2013-12-28 ENCOUNTER — Other Ambulatory Visit: Payer: Self-pay | Admitting: *Deleted

## 2013-12-28 DIAGNOSIS — N390 Urinary tract infection, site not specified: Secondary | ICD-10-CM | POA: Diagnosis not present

## 2013-12-29 DIAGNOSIS — H40013 Open angle with borderline findings, low risk, bilateral: Secondary | ICD-10-CM | POA: Diagnosis not present

## 2013-12-29 DIAGNOSIS — H53462 Homonymous bilateral field defects, left side: Secondary | ICD-10-CM | POA: Diagnosis not present

## 2013-12-29 DIAGNOSIS — Z961 Presence of intraocular lens: Secondary | ICD-10-CM | POA: Diagnosis not present

## 2013-12-29 DIAGNOSIS — H53461 Homonymous bilateral field defects, right side: Secondary | ICD-10-CM | POA: Diagnosis not present

## 2014-01-05 ENCOUNTER — Encounter: Payer: Self-pay | Admitting: Nurse Practitioner

## 2014-01-05 DIAGNOSIS — I10 Essential (primary) hypertension: Secondary | ICD-10-CM | POA: Diagnosis not present

## 2014-01-05 DIAGNOSIS — M549 Dorsalgia, unspecified: Secondary | ICD-10-CM | POA: Diagnosis not present

## 2014-01-12 ENCOUNTER — Non-Acute Institutional Stay (SKILLED_NURSING_FACILITY): Payer: Medicare Other | Admitting: Nurse Practitioner

## 2014-01-12 ENCOUNTER — Encounter: Payer: Self-pay | Admitting: Nurse Practitioner

## 2014-01-12 DIAGNOSIS — M546 Pain in thoracic spine: Secondary | ICD-10-CM | POA: Diagnosis not present

## 2014-01-12 DIAGNOSIS — K5900 Constipation, unspecified: Secondary | ICD-10-CM

## 2014-01-12 DIAGNOSIS — J441 Chronic obstructive pulmonary disease with (acute) exacerbation: Secondary | ICD-10-CM | POA: Diagnosis not present

## 2014-01-12 DIAGNOSIS — E039 Hypothyroidism, unspecified: Secondary | ICD-10-CM | POA: Diagnosis not present

## 2014-01-12 DIAGNOSIS — S22009S Unspecified fracture of unspecified thoracic vertebra, sequela: Secondary | ICD-10-CM

## 2014-01-12 DIAGNOSIS — I10 Essential (primary) hypertension: Secondary | ICD-10-CM | POA: Diagnosis not present

## 2014-01-12 DIAGNOSIS — F329 Major depressive disorder, single episode, unspecified: Secondary | ICD-10-CM

## 2014-01-12 DIAGNOSIS — G459 Transient cerebral ischemic attack, unspecified: Secondary | ICD-10-CM

## 2014-01-12 DIAGNOSIS — F32A Depression, unspecified: Secondary | ICD-10-CM

## 2014-01-12 HISTORY — DX: Pain in thoracic spine: M54.6

## 2014-01-12 NOTE — Progress Notes (Signed)
Patient ID: Dana George, female   DOB: 1923/11/24, 80 y.o.   MRN: [id number]   Code Status: DNR  No Known Allergies  Chief Complaint  Patient presents with  . Medical Management of Chronic Issues  . Acute Visit    mid back pain, palliative consult, constipation.     HPI: Patient is a 80 y.o. female seen in the SNF at [facility] today for evaluation of depression, mid back pain, desires Palliative consult, constipation,  and other chronic medical conditions.    Hospitalized 12/18/2013-12/20/2013 for COPD exacerbation [491.21] Essential hypertension [401.9] Thoracic spine fracture, closed, initial encounter [805.2]     Problem List Items Addressed This Visit   TIA (transient ischemic attack)     Recent TIA    Thoracic spine fracture     01/05/14 T 10 Dr. Newman Pies: PCP managing pain.  01/12/14 schedule Norco q am and continue prn q6h     Hypothyroidism     Takes Levothyroxine 16mcg daily, TSH 2.076 11/22/12. Update TSH       HTN (hypertension)     Controlled, takes Clonidine 0.1mg  daily, Carvedilol  12.5mg  bid, Cardizem 120mg , Lisinopril 33m, and Furosemide 10mg . Update CMP    Depression     Has been on Zoloft 50mg  for over 16 years, she seems more depressed, will try Zoloft 75mg . Observe the patient.     COPD exacerbation     Stable on Spiriva. Update CBC    Constipation     Controlled on Colace 100mg  daily, MiraLax daily and prn.        Back pain, thoracic - Primary     Continue to manage pain with Norco. May consider Palliative consult.        Review of Systems:  Review of Systems  Constitutional: Negative for fever, chills, weight loss, malaise/fatigue and diaphoresis.  HENT: Positive for hearing loss. Negative for congestion, ear discharge, ear pain, nosebleeds, sore throat and tinnitus.        Has dry mouth   Eyes: Negative for blurred vision, double vision, photophobia, pain, discharge and redness.  Respiratory: Negative for cough,  hemoptysis, sputum production, shortness of breath, wheezing and stridor.   Cardiovascular: Negative for chest pain, palpitations, orthopnea, claudication, leg swelling and PND.  Gastrointestinal: Positive for constipation. Negative for heartburn, nausea, vomiting, abdominal pain, blood in stool and melena.  Genitourinary: Negative for dysuria, urgency, frequency, hematuria and flank pain.  Musculoskeletal: Positive for back pain. Negative for falls, joint pain, myalgias and neck pain.       Middle back  Skin: Negative.  Negative for itching and rash.  Neurological: Negative for dizziness, tingling, tremors, sensory change, speech change, focal weakness, seizures, loss of consciousness, weakness and headaches.  Endo/Heme/Allergies: Negative for environmental allergies and polydipsia. Does not bruise/bleed easily.  Psychiatric/Behavioral: Positive for depression. Negative for suicidal ideas, hallucinations, memory loss and substance abuse. The patient is not nervous/anxious and does not have insomnia.    (type .ros)  Past Medical History  Diagnosis Date  . Hypothyroidism   . Hypertension   . Shortness of breath   . COPD (chronic obstructive pulmonary disease)   . Osteoporosis   . Cancer 1980    Breast cancer  . TIA (transient ischemic attack)    Past Surgical History  Procedure Laterality Date  . Abdominal hysterectomy    . Total knee arthroplasty  2010    left knee  . Mastectomy  1980    left side  . Tonsillectomy    .  Exploratory laparotomy w/ bowel resection      10/19/12-11/01/12 [facility] for exploratory laparotomty with extensive lysis of adhesion and segmental small bowel resection with end to end anastomosis per Dr. Isidore Moos 10/22/12  . Cataract extraction, bilateral     Social History:   reports that she quit smoking about 27 years ago. She does not have any smokeless tobacco history on file. She reports that she does not drink alcohol or use illicit drugs.  Family  History  Problem Relation Age of Onset  . High blood pressure Mother   . Osteoporosis Mother   . Heart attack Father     d/o 32    Medications: Patient's Medications  New Prescriptions   No medications on file  Previous Medications   ACETAMINOPHEN (TYLENOL) 325 MG TABLET    Take 650 mg by mouth every 4 (four) hours as needed for moderate pain.   ASPIRIN 81 MG CHEWABLE TABLET    Chew 81 mg by mouth every morning.   CARVEDILOL (COREG) 12.5 MG TABLET    Take 12.5 mg by mouth 2 (two) times daily with a meal.   CLONIDINE (CATAPRES) 0.1 MG TABLET    Take 0.1 mg by mouth every morning.    DILTIAZEM (CARDIZEM CD) 120 MG 24 HR CAPSULE    Take 120 mg by mouth daily.    DOCUSATE SODIUM (COLACE) 100 MG CAPSULE    Take 1 capsule (100 mg total) by mouth 2 (two) times daily.   FUROSEMIDE (LASIX) 20 MG TABLET    Take 10 mg by mouth every other day.    HYDROCODONE-ACETAMINOPHEN (NORCO/VICODIN) 5-325 MG PER TABLET    Take one tablet by mouth every 6 hours as needed for pain   LEVOTHYROXINE (SYNTHROID, LEVOTHROID) 50 MCG TABLET    Take 50 mcg by mouth every morning.    LISINOPRIL (PRINIVIL,ZESTRIL) 40 MG TABLET    Take 20 mg by mouth every morning.    MELATONIN 3 MG TBDP    Take 3 mg by mouth at bedtime.   MULTIPLE VITAMIN (MULTIVITAMIN WITH MINERALS) TABS TABLET    Take 1 tablet by mouth every morning.   POLYETHYLENE GLYCOL (MIRALAX / GLYCOLAX) PACKET    Take 17 g by mouth daily as needed.    SERTRALINE (ZOLOFT) 50 MG TABLET    Take 50 mg by mouth at bedtime.   TIOTROPIUM (SPIRIVA) 18 MCG INHALATION CAPSULE    Place 18 mcg into inhaler and inhale daily.  Modified Medications   No medications on file  Discontinued Medications   No medications on file     Physical Exam: Physical Exam  Constitutional: She is oriented to person, place, and time. She appears well-developed and well-nourished. No distress.  HENT:  Head: Normocephalic and atraumatic.  Right Ear: External ear normal.  Left Ear:  External ear normal.  Nose: Nose normal.  Mouth/Throat: Oropharynx is clear and moist. No oropharyngeal exudate.  Eyes: Conjunctivae and EOM are normal. Pupils are equal, round, and reactive to light. Right eye exhibits no discharge. Left eye exhibits no discharge. No scleral icterus.  Neck: Normal range of motion. Neck supple. No JVD present. No tracheal deviation present. No thyromegaly present.  Cardiovascular: Normal rate, regular rhythm, normal heart sounds and intact distal pulses.   No murmur heard. Pulmonary/Chest: Effort normal. No stridor. No respiratory distress. She has decreased breath sounds. She has no wheezes. She has rales. She exhibits no tenderness.  bibasilar  Abdominal: Soft. She exhibits no distension and no  mass. There is no tenderness. There is no rebound and no guarding.  Musculoskeletal: Normal range of motion. She exhibits edema. She exhibits no tenderness.  Ankles and feet 1+  Lymphadenopathy:    She has no cervical adenopathy.  Neurological: She is alert and oriented to person, place, and time. She has normal reflexes. No cranial nerve deficit. She exhibits normal muscle tone. Coordination normal.  Skin: Skin is warm and dry. No rash noted. She is not diaphoretic. No erythema. No pallor.  Mid abd surgical scar-healed except a small open area at the lower end of the incision with serous drainage. A blister on the top of the right 3rd toe  Psychiatric: She has a normal mood and affect. Her behavior is normal. Judgment and thought content normal.  Repetitive.     Filed Vitals:   01/12/14 1448  BP: 141/69  Pulse: 68  Temp: 97.5 F (36.4 C)  TempSrc: Tympanic  Resp: 20      Labs reviewed: Basic Metabolic Panel:  Recent Labs  12/09/13 0755 12/18/13 1621 12/19/13 0404  NA 141 144 141  K 3.9 3.5* 3.7  CL 101 100 102  CO2 27 26 27   GLUCOSE 117* 93 122*  BUN 20 13 21   CREATININE 0.64 0.56 0.74  CALCIUM 9.6 10.1 9.2   Liver Function Tests:  Recent  Labs  12/18/13 1621  AST 20  ALT 16  ALKPHOS 102  BILITOT 0.5  PROT 7.5  ALBUMIN 4.4   No results found for this basename: LIPASE, AMYLASE,  in the last 8760 hours No results found for this basename: AMMONIA,  in the last 8760 hours CBC:  Recent Labs  12/09/13 0755 12/18/13 1621 12/19/13 0404 12/20/13 0402  WBC 9.1 6.5 8.3 6.1  NEUTROABS 7.1 4.6  --   --   HGB 15.9* 17.2* 15.5* 15.2*  HCT 47.8* 51.4* 47.6* 45.9  MCV 97.4 94.8 96.4 96.6  PLT 390 450* 427* 419*   Lipid Panel: No results found for this basename: CHOL, HDL, LDLCALC, TRIG, CHOLHDL, LDLDIRECT,  in the last 8760 hours  Past Procedures:  12/18/13 CT head w/o CM:    IMPRESSION:  1. There is no acute intracranial hemorrhage nor other acute  abnormality of the brain. There are extensive changes of chronic  small vessel ischemia.  2. There is no acute skull fracture.   12/18/13 X-ray Chest, Lumbar spine, Thoracic spine:  IMPRESSION: T5 compression fracture stable since 12/10/2008.   T10 compression fracture appears new since 12/09/2013.  IMPRESSION: No evidence for lumbar spine fracture.   Compression deformity at the level of T10. Comparing back to a chest x-ray from 12/09/2013, this appears new in the interval.  IMPRESSION: Collapse of the T3 and T8 vertebral bodies which may be recent. Old healed fracture of left lateral seventh rib. Lungs clear. No pneumothorax.  Assessment/Plan Thoracic spine fracture 01/05/14 T 10 Dr. Newman Pies: PCP managing pain.  01/12/14 schedule Norco q am and continue prn q6h   Constipation Controlled on Colace 100mg  daily, MiraLax daily and prn.      Depression Has been on Zoloft 50mg  for over 16 years, she seems more depressed, will try Zoloft 75mg . Observe the patient.   Back pain, thoracic Continue to manage pain with Norco. May consider Palliative consult.   HTN (hypertension) Controlled, takes Clonidine 0.1mg  daily, Carvedilol  12.5mg  bid, Cardizem  120mg , Lisinopril 33m, and Furosemide 10mg . Update CMP  TIA (transient ischemic attack) Recent TIA  Hypothyroidism Takes Levothyroxine 1mcg daily,  TSH 2.076 11/22/12. Update TSH     COPD exacerbation Stable on Spiriva. Update CBC    Family/ Staff Communication: observe the patient.   Goals of Care: SNF  Labs/tests ordered: CBC, CMP, TSH

## 2014-01-12 NOTE — Assessment & Plan Note (Signed)
01/05/14 T 10 Dr. Newman Pies: PCP managing pain.  01/12/14 schedule Norco q am and continue prn q6h

## 2014-01-12 NOTE — Assessment & Plan Note (Addendum)
Has been on Zoloft 50mg  for over 16 years, she seems more depressed, will try Zoloft 75mg . Observe the patient.

## 2014-01-12 NOTE — Assessment & Plan Note (Addendum)
Takes Levothyroxine 42mcg daily, TSH 2.076 11/22/12. Update TSH

## 2014-01-12 NOTE — Assessment & Plan Note (Addendum)
Controlled, takes Clonidine 0.1mg  daily, Carvedilol  12.5mg  bid, Cardizem 120mg , Lisinopril 30m, and Furosemide 10mg . Update CMP

## 2014-01-12 NOTE — Assessment & Plan Note (Signed)
Continue to manage pain with Norco. May consider Palliative consult.

## 2014-01-12 NOTE — Assessment & Plan Note (Signed)
Stable on Spiriva. Update CBC

## 2014-01-12 NOTE — Assessment & Plan Note (Signed)
Recent TIA

## 2014-01-12 NOTE — Assessment & Plan Note (Signed)
Controlled on Colace 100mg daily, MiraLax daily and prn.   

## 2014-01-15 DIAGNOSIS — E039 Hypothyroidism, unspecified: Secondary | ICD-10-CM | POA: Diagnosis not present

## 2014-01-15 DIAGNOSIS — D649 Anemia, unspecified: Secondary | ICD-10-CM | POA: Diagnosis not present

## 2014-01-15 LAB — BASIC METABOLIC PANEL
BUN: 14 mg/dL (ref 4–21)
Creatinine: 0.6 mg/dL (ref 0.5–1.1)
Glucose: 95 mg/dL
POTASSIUM: 4.8 mmol/L (ref 3.4–5.3)
SODIUM: 139 mmol/L (ref 137–147)

## 2014-01-15 LAB — CBC AND DIFFERENTIAL
HCT: 45 % (ref 36–46)
Hemoglobin: 14.9 g/dL (ref 12.0–16.0)
Platelets: 440 10*3/uL — AB (ref 150–399)
WBC: 5.3 10^3/mL

## 2014-01-15 LAB — HEPATIC FUNCTION PANEL
ALT: 17 U/L (ref 7–35)
AST: 18 U/L (ref 13–35)
Alkaline Phosphatase: 89 U/L (ref 25–125)
Bilirubin, Total: 0.5 mg/dL

## 2014-01-15 LAB — TSH: TSH: 2.23 u[IU]/mL (ref 0.41–5.90)

## 2014-01-16 ENCOUNTER — Other Ambulatory Visit: Payer: Self-pay | Admitting: Nurse Practitioner

## 2014-01-16 DIAGNOSIS — I10 Essential (primary) hypertension: Secondary | ICD-10-CM

## 2014-01-16 DIAGNOSIS — E039 Hypothyroidism, unspecified: Secondary | ICD-10-CM

## 2014-01-16 DIAGNOSIS — M5415 Radiculopathy, thoracolumbar region: Secondary | ICD-10-CM | POA: Diagnosis not present

## 2014-01-30 DIAGNOSIS — M545 Low back pain: Secondary | ICD-10-CM | POA: Diagnosis not present

## 2014-02-06 ENCOUNTER — Encounter: Payer: Self-pay | Admitting: Nurse Practitioner

## 2014-02-06 ENCOUNTER — Non-Acute Institutional Stay (SKILLED_NURSING_FACILITY): Payer: Medicare Other | Admitting: Nurse Practitioner

## 2014-02-06 DIAGNOSIS — K5900 Constipation, unspecified: Secondary | ICD-10-CM

## 2014-02-06 DIAGNOSIS — S22009S Unspecified fracture of unspecified thoracic vertebra, sequela: Secondary | ICD-10-CM

## 2014-02-06 DIAGNOSIS — F329 Major depressive disorder, single episode, unspecified: Secondary | ICD-10-CM

## 2014-02-06 DIAGNOSIS — F32A Depression, unspecified: Secondary | ICD-10-CM

## 2014-02-06 DIAGNOSIS — I10 Essential (primary) hypertension: Secondary | ICD-10-CM

## 2014-02-06 DIAGNOSIS — E039 Hypothyroidism, unspecified: Secondary | ICD-10-CM | POA: Diagnosis not present

## 2014-02-06 DIAGNOSIS — J441 Chronic obstructive pulmonary disease with (acute) exacerbation: Secondary | ICD-10-CM | POA: Diagnosis not present

## 2014-02-06 DIAGNOSIS — M546 Pain in thoracic spine: Secondary | ICD-10-CM | POA: Diagnosis not present

## 2014-02-06 NOTE — Assessment & Plan Note (Signed)
Continue to manage pain with Norco. Palliative consult.

## 2014-02-06 NOTE — Assessment & Plan Note (Signed)
Has been on Zoloft 50mg  for over 16 years, she seems more depressed, no significant change since Zoloft 75mg . Observe the patient.

## 2014-02-06 NOTE — Assessment & Plan Note (Signed)
Takes Levothyroxine 50mcg daily, TSH 2.076 11/22/12.01/15/14 TSH 2.231 

## 2014-02-06 NOTE — Assessment & Plan Note (Signed)
Stable on Spiriva. 

## 2014-02-06 NOTE — Assessment & Plan Note (Signed)
Controlled on Colace 100mg daily, MiraLax daily and prn.   

## 2014-02-06 NOTE — Progress Notes (Signed)
Patient ID: Dana George, female   DOB: 04/07/1923, 80 y.o.   MRN: [id number]   Code Status: DNR  No Known Allergies  Chief Complaint  Patient presents with  . Medical Management of Chronic Issues    HPI: Patient is a 80 y.o. female seen in the SNF at [facility] today for evaluation of chronic medical conditions.    Hospitalized 12/18/2013-12/20/2013 for COPD exacerbation [491.21] Essential hypertension [401.9] Thoracic spine fracture, closed, initial encounter [805.2]     Problem List Items Addressed This Visit    Thoracic spine fracture    01/05/14 T 10 Dr. Newman Pies: PCP managing pain.  01/12/14 schedule Norco q am and continue prn q6h 02/06/14 pain is reasonably controlled.       Hypothyroidism    Takes Levothyroxine 3mcg daily, TSH 2.076 11/22/12.01/15/14 TSH 2.231     HTN (hypertension) - Primary    Controlled, takes Clonidine 0.1mg  daily, Carvedilol  12.5mg  bid, Cardizem 120mg , Lisinopril 64m, and Furosemide 10mg     Depression    Has been on Zoloft 50mg  for over 16 years, she seems more depressed, no significant change since Zoloft 75mg . Observe the patient.    COPD exacerbation    Stable on Spiriva    Constipation    Controlled on Colace 100mg  daily, MiraLax daily and prn.         Back pain, thoracic    Continue to manage pain with Norco. Palliative consult.         Review of Systems:  Review of Systems  Constitutional: Negative for fever, chills, weight loss, malaise/fatigue and diaphoresis.  HENT: Positive for hearing loss. Negative for congestion, ear discharge, ear pain, nosebleeds, sore throat and tinnitus.        Has dry mouth   Eyes: Negative for blurred vision, double vision, photophobia, pain, discharge and redness.  Respiratory: Negative for cough, hemoptysis, sputum production, shortness of breath, wheezing and stridor.   Cardiovascular: Negative for chest pain, palpitations, orthopnea, claudication, leg swelling and PND.    Gastrointestinal: Positive for constipation. Negative for heartburn, nausea, vomiting, abdominal pain, blood in stool and melena.  Genitourinary: Negative for dysuria, urgency, frequency, hematuria and flank pain.  Musculoskeletal: Positive for back pain. Negative for myalgias, joint pain, falls and neck pain.       Middle back  Skin: Negative.  Negative for itching and rash.  Neurological: Negative for dizziness, tingling, tremors, sensory change, speech change, focal weakness, seizures, loss of consciousness, weakness and headaches.  Endo/Heme/Allergies: Negative for environmental allergies and polydipsia. Does not bruise/bleed easily.  Psychiatric/Behavioral: Positive for depression. Negative for suicidal ideas, hallucinations, memory loss and substance abuse. The patient is not nervous/anxious and does not have insomnia.    (type .ros)  Past Medical History  Diagnosis Date  . Hypothyroidism   . Hypertension   . Shortness of breath   . COPD (chronic obstructive pulmonary disease)   . Osteoporosis   . Cancer 1980    Breast cancer  . TIA (transient ischemic attack)    Past Surgical History  Procedure Laterality Date  . Abdominal hysterectomy    . Total knee arthroplasty  2010    left knee  . Mastectomy  1980    left side  . Tonsillectomy    . Exploratory laparotomy w/ bowel resection      10/19/12-11/01/12 [facility] for exploratory laparotomty with extensive lysis of adhesion and segmental small bowel resection with end to end anastomosis per Dr. Isidore Moos 10/22/12  .  Cataract extraction, bilateral     Social History:   reports that she quit smoking about 28 years ago. She does not have any smokeless tobacco history on file. She reports that she does not drink alcohol or use illicit drugs.  Family History  Problem Relation Age of Onset  . High blood pressure Mother   . Osteoporosis Mother   . Heart attack Father     d/o 26    Medications: Patient's Medications  New  Prescriptions   No medications on file  Previous Medications   ACETAMINOPHEN (TYLENOL) 325 MG TABLET    Take 650 mg by mouth every 4 (four) hours as needed for moderate pain.   ASPIRIN 81 MG CHEWABLE TABLET    Chew 81 mg by mouth every morning.   CARVEDILOL (COREG) 12.5 MG TABLET    Take 12.5 mg by mouth 2 (two) times daily with a meal.   CLONIDINE (CATAPRES) 0.1 MG TABLET    Take 0.1 mg by mouth every morning.    DILTIAZEM (CARDIZEM CD) 120 MG 24 HR CAPSULE    Take 120 mg by mouth daily.    DOCUSATE SODIUM (COLACE) 100 MG CAPSULE    Take 1 capsule (100 mg total) by mouth 2 (two) times daily.   FUROSEMIDE (LASIX) 20 MG TABLET    Take 10 mg by mouth every other day.    HYDROCODONE-ACETAMINOPHEN (NORCO/VICODIN) 5-325 MG PER TABLET    Take one tablet by mouth every 6 hours as needed for pain   LEVOTHYROXINE (SYNTHROID, LEVOTHROID) 50 MCG TABLET    Take 50 mcg by mouth every morning.    LISINOPRIL (PRINIVIL,ZESTRIL) 40 MG TABLET    Take 20 mg by mouth every morning.    MELATONIN 3 MG TBDP    Take 3 mg by mouth at bedtime.   MULTIPLE VITAMIN (MULTIVITAMIN WITH MINERALS) TABS TABLET    Take 1 tablet by mouth every morning.   POLYETHYLENE GLYCOL (MIRALAX / GLYCOLAX) PACKET    Take 17 g by mouth daily as needed.    SERTRALINE (ZOLOFT) 50 MG TABLET    Take 50 mg by mouth at bedtime.   TIOTROPIUM (SPIRIVA) 18 MCG INHALATION CAPSULE    Place 18 mcg into inhaler and inhale daily.  Modified Medications   No medications on file  Discontinued Medications   No medications on file     Physical Exam: Physical Exam  Constitutional: She is oriented to person, place, and time. She appears well-developed and well-nourished. No distress.  HENT:  Head: Normocephalic and atraumatic.  Right Ear: External ear normal.  Left Ear: External ear normal.  Nose: Nose normal.  Mouth/Throat: Oropharynx is clear and moist. No oropharyngeal exudate.  Eyes: Conjunctivae and EOM are normal. Pupils are equal, round, and  reactive to light. Right eye exhibits no discharge. Left eye exhibits no discharge. No scleral icterus.  Neck: Normal range of motion. Neck supple. No JVD present. No tracheal deviation present. No thyromegaly present.  Cardiovascular: Normal rate, regular rhythm, normal heart sounds and intact distal pulses.   No murmur heard. Pulmonary/Chest: Effort normal. No stridor. No respiratory distress. She has decreased breath sounds. She has no wheezes. She has rales. She exhibits no tenderness.  bibasilar  Abdominal: Soft. She exhibits no distension and no mass. There is no tenderness. There is no rebound and no guarding.  Musculoskeletal: Normal range of motion. She exhibits edema and tenderness.  Ankles and feet trace. Mid back pain.   Lymphadenopathy:  She has no cervical adenopathy.  Neurological: She is alert and oriented to person, place, and time. She has normal reflexes. No cranial nerve deficit. She exhibits normal muscle tone. Coordination normal.  Skin: Skin is warm and dry. No rash noted. She is not diaphoretic. No erythema. No pallor.  Mid abd surgical scar-healed. Left mastectomy  Psychiatric: She has a normal mood and affect. Her behavior is normal. Judgment and thought content normal.  Repetitive.     Filed Vitals:   02/06/14 1516  BP: 132/62  Pulse: 72  Temp: 98.2 F (36.8 C)  TempSrc: Tympanic  Resp: 18      Labs reviewed: Basic Metabolic Panel:  Recent Labs  12/09/13 0755 12/18/13 1621 12/19/13 0404 01/15/14  NA 141 144 141 139  K 3.9 3.5* 3.7 4.8  CL 101 100 102  --   CO2 27 26 27   --   GLUCOSE 117* 93 122*  --   BUN 20 13 21 14   CREATININE 0.64 0.56 0.74 0.6  CALCIUM 9.6 10.1 9.2  --   TSH  --   --   --  2.23   Liver Function Tests:  Recent Labs  12/18/13 1621 01/15/14  AST 20 18  ALT 16 17  ALKPHOS 102 89  BILITOT 0.5  --   PROT 7.5  --   ALBUMIN 4.4  --    No results for input(s): LIPASE, AMYLASE in the last 8760 hours. No results for  input(s): AMMONIA in the last 8760 hours. CBC:  Recent Labs  12/09/13 0755 12/18/13 1621 12/19/13 0404 12/20/13 0402 01/15/14  WBC 9.1 6.5 8.3 6.1 5.3  NEUTROABS 7.1 4.6  --   --   --   HGB 15.9* 17.2* 15.5* 15.2* 14.9  HCT 47.8* 51.4* 47.6* 45.9 45  MCV 97.4 94.8 96.4 96.6  --   PLT 390 450* 427* 419* 440*   Lipid Panel: No results for input(s): CHOL, HDL, LDLCALC, TRIG, CHOLHDL, LDLDIRECT in the last 8760 hours.  Past Procedures:  12/18/13 CT head w/o CM:    IMPRESSION:  1. There is no acute intracranial hemorrhage nor other acute  abnormality of the brain. There are extensive changes of chronic  small vessel ischemia.  2. There is no acute skull fracture.   12/18/13 X-ray Chest, Lumbar spine, Thoracic spine:  IMPRESSION: T5 compression fracture stable since 12/10/2008.   T10 compression fracture appears new since 12/09/2013.  IMPRESSION: No evidence for lumbar spine fracture.   Compression deformity at the level of T10. Comparing back to a chest x-ray from 12/09/2013, this appears new in the interval.  IMPRESSION: Collapse of the T3 and T8 vertebral bodies which may be recent. Old healed fracture of left lateral seventh rib. Lungs clear. No pneumothorax.  Assessment/Plan Back pain, thoracic Continue to manage pain with Norco. Palliative consult.    Constipation Controlled on Colace 100mg  daily, MiraLax daily and prn.       COPD exacerbation Stable on Spiriva  Depression Has been on Zoloft 50mg  for over 16 years, she seems more depressed, no significant change since Zoloft 75mg . Observe the patient.  HTN (hypertension) Controlled, takes Clonidine 0.1mg  daily, Carvedilol  12.5mg  bid, Cardizem 120mg , Lisinopril 93m, and Furosemide 10mg   Hypothyroidism Takes Levothyroxine 38mcg daily, TSH 2.076 11/22/12.01/15/14 TSH 2.231   Thoracic spine fracture 01/05/14 T 10 Dr. Newman Pies: PCP managing pain.  01/12/14 schedule Norco q am and continue prn  q6h 02/06/14 pain is reasonably controlled.  Family/ Staff Communication: observe the patient.   Goals of Care: SNF  Labs/tests ordered: none

## 2014-02-06 NOTE — Assessment & Plan Note (Signed)
Controlled, takes Clonidine 0.1mg daily, Carvedilol  12.5mg bid, Cardizem 120mg, Lisinopril 40m, and Furosemide 10mg  

## 2014-02-06 NOTE — Assessment & Plan Note (Signed)
01/05/14 T 10 Dr. Newman Pies: PCP managing pain.  01/12/14 schedule Norco q am and continue prn q6h 02/06/14 pain is reasonably controlled.

## 2014-03-13 ENCOUNTER — Non-Acute Institutional Stay (SKILLED_NURSING_FACILITY): Payer: Medicare Other | Admitting: Nurse Practitioner

## 2014-03-13 DIAGNOSIS — F329 Major depressive disorder, single episode, unspecified: Secondary | ICD-10-CM | POA: Diagnosis not present

## 2014-03-13 DIAGNOSIS — I10 Essential (primary) hypertension: Secondary | ICD-10-CM | POA: Diagnosis not present

## 2014-03-13 DIAGNOSIS — K5900 Constipation, unspecified: Secondary | ICD-10-CM | POA: Diagnosis not present

## 2014-03-13 DIAGNOSIS — M546 Pain in thoracic spine: Secondary | ICD-10-CM | POA: Diagnosis not present

## 2014-03-13 DIAGNOSIS — F32A Depression, unspecified: Secondary | ICD-10-CM

## 2014-03-13 DIAGNOSIS — E039 Hypothyroidism, unspecified: Secondary | ICD-10-CM

## 2014-03-13 NOTE — Assessment & Plan Note (Signed)
Controlled on Colace 100mg  daily, MiraLax daily and prn.

## 2014-03-13 NOTE — Progress Notes (Signed)
Patient ID: Dana George, female   DOB: 03/16/1924, 80 y.o.   MRN: [id number]   Code Status: DNR  No Known Allergies  Chief Complaint  Patient presents with  . Medical Management of Chronic Issues    HPI: Patient is a 80 y.o. female seen in the SNF at [facility] today for evaluation of chronic medical conditions.    Hospitalized 12/18/2013-12/20/2013 for COPD exacerbation  Essential hypertension Thoracic spine fracture, closed  Problem List Items Addressed This Visit    Hypothyroidism    Takes Levothyroxine 63mcg daily, TSH 2.076 11/22/12.01/15/14 TSH 2.231      HTN (hypertension)    Controlled, takes Clonidine 0.1mg  daily, Carvedilol  12.5mg  bid, Cardizem 120mg , Lisinopril 67m, and Furosemide 10mg      Depression    Has been on Zoloft 50mg  for over 16 years, she seems more depressed, no significant change since Zoloft 75mg . Observe the patient. 03/13/14 stable, continue Zoloft 75mg  daily.      Constipation    Controlled on Colace 100mg  daily, MiraLax daily and prn.      Back pain, thoracic - Primary    01/05/14 T 10 Dr. Newman Pies: PCP managing pain.  01/12/14 schedule Norco q am and continue prn q6h 02/06/14 pain is reasonably controlled.  03/13/14 pain is managed with Norco bid-only prn Tylenol used x2/Dec          Review of Systems:  Review of Systems  Constitutional: Negative for fever, chills, weight loss, malaise/fatigue and diaphoresis.  HENT: Positive for hearing loss. Negative for congestion, ear discharge, ear pain, nosebleeds, sore throat and tinnitus.        Has dry mouth   Eyes: Negative for blurred vision, double vision, photophobia, pain, discharge and redness.  Respiratory: Negative for cough, hemoptysis, sputum production, shortness of breath, wheezing and stridor.   Cardiovascular: Negative for chest pain, palpitations, orthopnea, claudication, leg swelling and PND.  Gastrointestinal: Positive for constipation. Negative for heartburn,  nausea, vomiting, abdominal pain, blood in stool and melena.  Genitourinary: Negative for dysuria, urgency, frequency, hematuria and flank pain.  Musculoskeletal: Positive for back pain. Negative for myalgias, joint pain, falls and neck pain.       Middle back  Skin: Negative.  Negative for itching and rash.  Neurological: Negative for dizziness, tingling, tremors, sensory change, speech change, focal weakness, seizures, loss of consciousness, weakness and headaches.  Endo/Heme/Allergies: Negative for environmental allergies and polydipsia. Does not bruise/bleed easily.  Psychiatric/Behavioral: Positive for depression. Negative for suicidal ideas, hallucinations, memory loss and substance abuse. The patient is not nervous/anxious and does not have insomnia.    (type .ros)  Past Medical History  Diagnosis Date  . Hypothyroidism   . Hypertension   . Shortness of breath   . COPD (chronic obstructive pulmonary disease)   . Osteoporosis   . Cancer 1980    Breast cancer  . TIA (transient ischemic attack)    Past Surgical History  Procedure Laterality Date  . Abdominal hysterectomy    . Total knee arthroplasty  2010    left knee  . Mastectomy  1980    left side  . Tonsillectomy    . Exploratory laparotomy w/ bowel resection      10/19/12-11/01/12 [facility] for exploratory laparotomty with extensive lysis of adhesion and segmental small bowel resection with end to end anastomosis per Dr. Isidore Moos 10/22/12  . Cataract extraction, bilateral     Social History:   reports that she quit smoking about 28 years  ago. She does not have any smokeless tobacco history on file. She reports that she does not drink alcohol or use illicit drugs.  Family History  Problem Relation Age of Onset  . High blood pressure Mother   . Osteoporosis Mother   . Heart attack Father     d/o 22    Medications: Patient's Medications  New Prescriptions   No medications on file  Previous Medications    ACETAMINOPHEN (TYLENOL) 325 MG TABLET    Take 650 mg by mouth every 4 (four) hours as needed for moderate pain.   ASPIRIN 81 MG CHEWABLE TABLET    Chew 81 mg by mouth every morning.   CARVEDILOL (COREG) 12.5 MG TABLET    Take 12.5 mg by mouth 2 (two) times daily with a meal.   CLONIDINE (CATAPRES) 0.1 MG TABLET    Take 0.1 mg by mouth every morning.    DILTIAZEM (CARDIZEM CD) 120 MG 24 HR CAPSULE    Take 120 mg by mouth daily.    DOCUSATE SODIUM (COLACE) 100 MG CAPSULE    Take 1 capsule (100 mg total) by mouth 2 (two) times daily.   FUROSEMIDE (LASIX) 20 MG TABLET    Take 10 mg by mouth every other day.    HYDROCODONE-ACETAMINOPHEN (NORCO/VICODIN) 5-325 MG PER TABLET    Take one tablet by mouth every 6 hours as needed for pain   LEVOTHYROXINE (SYNTHROID, LEVOTHROID) 50 MCG TABLET    Take 50 mcg by mouth every morning.    LISINOPRIL (PRINIVIL,ZESTRIL) 40 MG TABLET    Take 20 mg by mouth every morning.    MELATONIN 3 MG TBDP    Take 3 mg by mouth at bedtime.   MULTIPLE VITAMIN (MULTIVITAMIN WITH MINERALS) TABS TABLET    Take 1 tablet by mouth every morning.   POLYETHYLENE GLYCOL (MIRALAX / GLYCOLAX) PACKET    Take 17 g by mouth daily as needed.    SERTRALINE (ZOLOFT) 50 MG TABLET    Take 50 mg by mouth at bedtime.   TIOTROPIUM (SPIRIVA) 18 MCG INHALATION CAPSULE    Place 18 mcg into inhaler and inhale daily.  Modified Medications   No medications on file  Discontinued Medications   No medications on file     Physical Exam: Physical Exam  Constitutional: She is oriented to person, place, and time. She appears well-developed and well-nourished. No distress.  HENT:  Head: Normocephalic and atraumatic.  Right Ear: External ear normal.  Left Ear: External ear normal.  Nose: Nose normal.  Mouth/Throat: Oropharynx is clear and moist. No oropharyngeal exudate.  Eyes: Conjunctivae and EOM are normal. Pupils are equal, round, and reactive to light. Right eye exhibits no discharge. Left eye  exhibits no discharge. No scleral icterus.  Neck: Normal range of motion. Neck supple. No JVD present. No tracheal deviation present. No thyromegaly present.  Cardiovascular: Normal rate, regular rhythm, normal heart sounds and intact distal pulses.   No murmur heard. Pulmonary/Chest: Effort normal. No stridor. No respiratory distress. She has decreased breath sounds. She has no wheezes. She has rales. She exhibits no tenderness.  bibasilar  Abdominal: Soft. She exhibits no distension and no mass. There is no tenderness. There is no rebound and no guarding.  Musculoskeletal: Normal range of motion. She exhibits edema and tenderness.  Ankles and feet trace. Mid back pain.   Lymphadenopathy:    She has no cervical adenopathy.  Neurological: She is alert and oriented to person, place, and time. She has  normal reflexes. No cranial nerve deficit. She exhibits normal muscle tone. Coordination normal.  Skin: Skin is warm and dry. No rash noted. She is not diaphoretic. No erythema. No pallor.  Mid abd surgical scar-healed. Left mastectomy  Psychiatric: She has a normal mood and affect. Her behavior is normal. Judgment and thought content normal.  Repetitive.     Filed Vitals:   03/13/14 1256  BP: 124/59  Pulse: 71  Temp: 96.4 F (35.8 C)  TempSrc: Tympanic  Resp: 18      Labs reviewed: Basic Metabolic Panel:  Recent Labs  12/09/13 0755 12/18/13 1621 12/19/13 0404 01/15/14  NA 141 144 141 139  K 3.9 3.5* 3.7 4.8  CL 101 100 102  --   CO2 27 26 27   --   GLUCOSE 117* 93 122*  --   BUN 20 13 21 14   CREATININE 0.64 0.56 0.74 0.6  CALCIUM 9.6 10.1 9.2  --   TSH  --   --   --  2.23   Liver Function Tests:  Recent Labs  12/18/13 1621 01/15/14  AST 20 18  ALT 16 17  ALKPHOS 102 89  BILITOT 0.5  --   PROT 7.5  --   ALBUMIN 4.4  --    No results for input(s): LIPASE, AMYLASE in the last 8760 hours. No results for input(s): AMMONIA in the last 8760 hours. CBC:  Recent  Labs  12/09/13 0755 12/18/13 1621 12/19/13 0404 12/20/13 0402 01/15/14  WBC 9.1 6.5 8.3 6.1 5.3  NEUTROABS 7.1 4.6  --   --   --   HGB 15.9* 17.2* 15.5* 15.2* 14.9  HCT 47.8* 51.4* 47.6* 45.9 45  MCV 97.4 94.8 96.4 96.6  --   PLT 390 450* 427* 419* 440*   Lipid Panel: No results for input(s): CHOL, HDL, LDLCALC, TRIG, CHOLHDL, LDLDIRECT in the last 8760 hours.  Past Procedures:  12/18/13 CT head w/o CM:    IMPRESSION:  1. There is no acute intracranial hemorrhage nor other acute  abnormality of the brain. There are extensive changes of chronic  small vessel ischemia.  2. There is no acute skull fracture.   12/18/13 X-ray Chest, Lumbar spine, Thoracic spine:  IMPRESSION: T5 compression fracture stable since 12/10/2008.   T10 compression fracture appears new since 12/09/2013.  IMPRESSION: No evidence for lumbar spine fracture.   Compression deformity at the level of T10. Comparing back to a chest x-ray from 12/09/2013, this appears new in the interval.  IMPRESSION: Collapse of the T3 and T8 vertebral bodies which may be recent. Old healed fracture of left lateral seventh rib. Lungs clear. No pneumothorax.  Assessment/Plan Back pain, thoracic 01/05/14 T 10 Dr. Newman Pies: PCP managing pain.  01/12/14 schedule Norco q am and continue prn q6h 02/06/14 pain is reasonably controlled.  03/13/14 pain is managed with Norco bid-only prn Tylenol used x2/Dec     Constipation Controlled on Colace 100mg  daily, MiraLax daily and prn.    Depression Has been on Zoloft 50mg  for over 16 years, she seems more depressed, no significant change since Zoloft 75mg . Observe the patient. 03/13/14 stable, continue Zoloft 75mg  daily.    HTN (hypertension) Controlled, takes Clonidine 0.1mg  daily, Carvedilol  12.5mg  bid, Cardizem 120mg , Lisinopril 22m, and Furosemide 10mg    Hypothyroidism Takes Levothyroxine 83mcg daily, TSH 2.076 11/22/12.01/15/14 TSH 2.231      Family/  Staff Communication: observe the patient.   Goals of Care: SNF  Labs/tests ordered: none

## 2014-03-13 NOTE — Assessment & Plan Note (Signed)
Takes Levothyroxine 45mcg daily, TSH 2.076 11/22/12.01/15/14 TSH 2.231

## 2014-03-13 NOTE — Assessment & Plan Note (Signed)
Has been on Zoloft 50mg  for over 16 years, she seems more depressed, no significant change since Zoloft 75mg . Observe the patient. 03/13/14 stable, continue Zoloft 75mg  daily.

## 2014-03-13 NOTE — Assessment & Plan Note (Signed)
Controlled, takes Clonidine 0.1mg  daily, Carvedilol  12.5mg  bid, Cardizem 120mg , Lisinopril 7m, and Furosemide 10mg 

## 2014-03-13 NOTE — Assessment & Plan Note (Signed)
01/05/14 T 10 Dr. Newman Pies: PCP managing pain.  01/12/14 schedule Norco q am and continue prn q6h 02/06/14 pain is reasonably controlled.  03/13/14 pain is managed with Norco bid-only prn Tylenol used x2/Dec

## 2014-03-27 DIAGNOSIS — M546 Pain in thoracic spine: Secondary | ICD-10-CM | POA: Diagnosis not present

## 2014-03-27 DIAGNOSIS — S22000G Wedge compression fracture of unspecified thoracic vertebra, subsequent encounter for fracture with delayed healing: Secondary | ICD-10-CM | POA: Diagnosis not present

## 2014-03-27 DIAGNOSIS — I10 Essential (primary) hypertension: Secondary | ICD-10-CM | POA: Diagnosis not present

## 2014-03-30 ENCOUNTER — Encounter: Payer: Self-pay | Admitting: Nurse Practitioner

## 2014-03-30 ENCOUNTER — Non-Acute Institutional Stay (SKILLED_NURSING_FACILITY): Payer: Medicare Other | Admitting: Nurse Practitioner

## 2014-03-30 DIAGNOSIS — I10 Essential (primary) hypertension: Secondary | ICD-10-CM | POA: Diagnosis not present

## 2014-03-30 DIAGNOSIS — K5900 Constipation, unspecified: Secondary | ICD-10-CM

## 2014-03-30 DIAGNOSIS — R609 Edema, unspecified: Secondary | ICD-10-CM

## 2014-03-30 DIAGNOSIS — K219 Gastro-esophageal reflux disease without esophagitis: Secondary | ICD-10-CM | POA: Diagnosis not present

## 2014-03-30 DIAGNOSIS — F329 Major depressive disorder, single episode, unspecified: Secondary | ICD-10-CM | POA: Diagnosis not present

## 2014-03-30 DIAGNOSIS — E039 Hypothyroidism, unspecified: Secondary | ICD-10-CM

## 2014-03-30 DIAGNOSIS — F32A Depression, unspecified: Secondary | ICD-10-CM

## 2014-03-30 DIAGNOSIS — M546 Pain in thoracic spine: Secondary | ICD-10-CM | POA: Diagnosis not present

## 2014-03-30 HISTORY — DX: Gastro-esophageal reflux disease without esophagitis: K21.9

## 2014-03-30 NOTE — Assessment & Plan Note (Signed)
0/16/15 T 10 Dr. Newman Pies: PCP managing pain.  01/12/14 schedule Norco q am and continue prn q6h 02/06/14 pain is reasonably controlled.

## 2014-03-30 NOTE — Progress Notes (Signed)
Patient ID: Dana George, female   DOB: May 12, 1923, 81 y.o.   MRN: [id number]   Code Status: DNR  No Known Allergies  Chief Complaint  Patient presents with  . Medical Management of Chronic Issues  . Acute Visit    constipation, GERD, skin lesion.     HPI: Patient is a 81 y.o. female seen in the SNF at [facility] today for evaluation of chronic medical conditions.    Hospitalized 12/18/2013-12/20/2013 for COPD exacerbation  Essential hypertension Thoracic spine fracture, closed  Problem List Items Addressed This Visit    Hypothyroidism    Takes Levothyroxine 79mcg daily, TSH 2.076 11/22/12.01/15/14 TSH 2.231    HTN (hypertension)    Controlled, takes Clonidine 0.1mg  daily, Carvedilol  12.5mg  bid, Cardizem 120mg , Lisinopril 1m, and Furosemide 10mg       GERD (gastroesophageal reflux disease) - Primary    Hx. PPI in the past. Omeprazole 20mg  daily. Observe.     Relevant Medications      omeprazole (PRILOSEC) 20 MG capsule   Edema    Ankles/feet--takes Furosemide 10mg  daily 11/25/12. No significant weight change.       Depression    Has been on Zoloft 50mg  for over 16 years, she seems more depressed, no significant change since Zoloft 75mg . Observe the patient. 03/13/14 stable, continue Zoloft 75mg  daily.  03/30/14 stabilized.     Constipation    Controlled. dcColace 100mg  daily. Continue MiraLax daily and prn.       Back pain, thoracic    0/16/15 T 10 Dr. Newman Pies: PCP managing pain.  01/12/14 schedule Norco q am and continue prn q6h 02/06/14 pain is reasonably controlled.         Review of Systems:  Review of Systems  Constitutional: Negative for fever, chills, weight loss, malaise/fatigue and diaphoresis.  HENT: Positive for hearing loss. Negative for congestion, ear discharge, ear pain, nosebleeds, sore throat and tinnitus.        Has dry mouth   Eyes: Negative for blurred vision, double vision, photophobia, pain, discharge and redness.    Respiratory: Negative for cough, hemoptysis, sputum production, shortness of breath, wheezing and stridor.   Cardiovascular: Negative for chest pain, palpitations, orthopnea, claudication, leg swelling and PND.  Gastrointestinal: Positive for heartburn and constipation. Negative for nausea, vomiting, abdominal pain, blood in stool and melena.  Genitourinary: Negative for dysuria, urgency, frequency, hematuria and flank pain.  Musculoskeletal: Positive for back pain. Negative for myalgias, joint pain, falls and neck pain.       Middle back  Skin: Negative.  Negative for itching and rash.       Scalp skin lesion above the left ear.   Neurological: Negative for dizziness, tingling, tremors, sensory change, speech change, focal weakness, seizures, loss of consciousness, weakness and headaches.  Endo/Heme/Allergies: Negative for environmental allergies and polydipsia. Does not bruise/bleed easily.  Psychiatric/Behavioral: Positive for depression. Negative for suicidal ideas, hallucinations, memory loss and substance abuse. The patient is not nervous/anxious and does not have insomnia.    (type .ros)  Past Medical History  Diagnosis Date  . Hypothyroidism   . Hypertension   . Shortness of breath   . COPD (chronic obstructive pulmonary disease)   . Osteoporosis   . Cancer 1980    Breast cancer  . TIA (transient ischemic attack)    Past Surgical History  Procedure Laterality Date  . Abdominal hysterectomy    . Total knee arthroplasty  2010    left knee  . Mastectomy  1980    left side  . Tonsillectomy    . Exploratory laparotomy w/ bowel resection      10/19/12-11/01/12 [facility] for exploratory laparotomty with extensive lysis of adhesion and segmental small bowel resection with end to end anastomosis per Dr. Isidore Moos 10/22/12  . Cataract extraction, bilateral     Social History:   reports that she quit smoking about 28 years ago. She does not have any smokeless tobacco history on  file. She reports that she does not drink alcohol or use illicit drugs.  Family History  Problem Relation Age of Onset  . High blood pressure Mother   . Osteoporosis Mother   . Heart attack Father     d/o 40    Medications: Patient's Medications  New Prescriptions   No medications on file  Previous Medications   ACETAMINOPHEN (TYLENOL) 325 MG TABLET    Take 650 mg by mouth every 4 (four) hours as needed for moderate pain.   ASPIRIN 81 MG CHEWABLE TABLET    Chew 81 mg by mouth every morning.   CARVEDILOL (COREG) 12.5 MG TABLET    Take 12.5 mg by mouth 2 (two) times daily with a meal.   CLONIDINE (CATAPRES) 0.1 MG TABLET    Take 0.1 mg by mouth every morning.    DILTIAZEM (CARDIZEM CD) 120 MG 24 HR CAPSULE    Take 120 mg by mouth daily.    DOCUSATE SODIUM (COLACE) 100 MG CAPSULE    Take 1 capsule (100 mg total) by mouth 2 (two) times daily.   FUROSEMIDE (LASIX) 20 MG TABLET    Take 10 mg by mouth every other day.    HYDROCODONE-ACETAMINOPHEN (NORCO/VICODIN) 5-325 MG PER TABLET    Take one tablet by mouth every 6 hours as needed for pain   LEVOTHYROXINE (SYNTHROID, LEVOTHROID) 50 MCG TABLET    Take 50 mcg by mouth every morning.    LISINOPRIL (PRINIVIL,ZESTRIL) 40 MG TABLET    Take 20 mg by mouth every morning.    MELATONIN 3 MG TBDP    Take 3 mg by mouth at bedtime.   MULTIPLE VITAMIN (MULTIVITAMIN WITH MINERALS) TABS TABLET    Take 1 tablet by mouth every morning.   OMEPRAZOLE (PRILOSEC) 20 MG CAPSULE    Take 20 mg by mouth daily.   POLYETHYLENE GLYCOL (MIRALAX / GLYCOLAX) PACKET    Take 17 g by mouth daily as needed.    SERTRALINE (ZOLOFT) 50 MG TABLET    Take 50 mg by mouth at bedtime.   TIOTROPIUM (SPIRIVA) 18 MCG INHALATION CAPSULE    Place 18 mcg into inhaler and inhale daily.  Modified Medications   No medications on file  Discontinued Medications   No medications on file     Physical Exam: Physical Exam  Constitutional: She is oriented to person, place, and time. She  appears well-developed and well-nourished. No distress.  HENT:  Head: Normocephalic and atraumatic.  Right Ear: External ear normal.  Left Ear: External ear normal.  Nose: Nose normal.  Mouth/Throat: Oropharynx is clear and moist. No oropharyngeal exudate.  Eyes: Conjunctivae and EOM are normal. Pupils are equal, round, and reactive to light. Right eye exhibits no discharge. Left eye exhibits no discharge. No scleral icterus.  Neck: Normal range of motion. Neck supple. No JVD present. No tracheal deviation present. No thyromegaly present.  Cardiovascular: Normal rate, regular rhythm, normal heart sounds and intact distal pulses.   No murmur heard. Pulmonary/Chest: Effort normal. No stridor. No  respiratory distress. She has decreased breath sounds. She has no wheezes. She has rales. She exhibits no tenderness.  bibasilar  Abdominal: Soft. She exhibits no distension and no mass. There is no tenderness. There is no rebound and no guarding.  Musculoskeletal: Normal range of motion. She exhibits edema and tenderness.  Ankles and feet trace. Mid back pain.   Lymphadenopathy:    She has no cervical adenopathy.  Neurological: She is alert and oriented to person, place, and time. She has normal reflexes. No cranial nerve deficit. She exhibits normal muscle tone. Coordination normal.  Skin: Skin is warm and dry. No rash noted. She is not diaphoretic. No erythema. No pallor.  Mid abd surgical scar-healed. Left mastectomy Scalp skin lesion above the left ear.   Psychiatric: She has a normal mood and affect. Her behavior is normal. Judgment and thought content normal.  Repetitive.     Filed Vitals:   03/30/14 1416  BP: 174/83  Pulse: 67  Temp: 98.5 F (36.9 C)  TempSrc: Tympanic  Resp: 20      Labs reviewed: Basic Metabolic Panel:  Recent Labs  12/09/13 0755 12/18/13 1621 12/19/13 0404 01/15/14  NA 141 144 141 139  K 3.9 3.5* 3.7 4.8  CL 101 100 102  --   CO2 27 26 27   --     GLUCOSE 117* 93 122*  --   BUN 20 13 21 14   CREATININE 0.64 0.56 0.74 0.6  CALCIUM 9.6 10.1 9.2  --   TSH  --   --   --  2.23   Liver Function Tests:  Recent Labs  12/18/13 1621 01/15/14  AST 20 18  ALT 16 17  ALKPHOS 102 89  BILITOT 0.5  --   PROT 7.5  --   ALBUMIN 4.4  --    No results for input(s): LIPASE, AMYLASE in the last 8760 hours. No results for input(s): AMMONIA in the last 8760 hours. CBC:  Recent Labs  12/09/13 0755 12/18/13 1621 12/19/13 0404 12/20/13 0402 01/15/14  WBC 9.1 6.5 8.3 6.1 5.3  NEUTROABS 7.1 4.6  --   --   --   HGB 15.9* 17.2* 15.5* 15.2* 14.9  HCT 47.8* 51.4* 47.6* 45.9 45  MCV 97.4 94.8 96.4 96.6  --   PLT 390 450* 427* 419* 440*   Lipid Panel: No results for input(s): CHOL, HDL, LDLCALC, TRIG, CHOLHDL, LDLDIRECT in the last 8760 hours.  Past Procedures:  12/18/13 CT head w/o CM:    IMPRESSION:  1. There is no acute intracranial hemorrhage nor other acute  abnormality of the brain. There are extensive changes of chronic  small vessel ischemia.  2. There is no acute skull fracture.   12/18/13 X-ray Chest, Lumbar spine, Thoracic spine:  IMPRESSION: T5 compression fracture stable since 12/10/2008.   T10 compression fracture appears new since 12/09/2013.  IMPRESSION: No evidence for lumbar spine fracture.   Compression deformity at the level of T10. Comparing back to a chest x-ray from 12/09/2013, this appears new in the interval.  IMPRESSION: Collapse of the T3 and T8 vertebral bodies which may be recent. Old healed fracture of left lateral seventh rib. Lungs clear. No pneumothorax.  Assessment/Plan GERD (gastroesophageal reflux disease) Hx. PPI in the past. Omeprazole 20mg  daily. Observe.   Edema Ankles/feet--takes Furosemide 10mg  daily 11/25/12. No significant weight change.     Constipation Controlled. dcColace 100mg  daily. Continue MiraLax daily and prn.     Back pain, thoracic 0/16/15 T 10 Dr.  Newman Pies: PCP managing pain.  01/12/14 schedule Norco q am and continue prn q6h 02/06/14 pain is reasonably controlled.    Depression Has been on Zoloft 50mg  for over 16 years, she seems more depressed, no significant change since Zoloft 75mg . Observe the patient. 03/13/14 stable, continue Zoloft 75mg  daily.  03/30/14 stabilized.   HTN (hypertension) Controlled, takes Clonidine 0.1mg  daily, Carvedilol  12.5mg  bid, Cardizem 120mg , Lisinopril 57m, and Furosemide 10mg     Hypothyroidism Takes Levothyroxine 45mcg daily, TSH 2.076 11/22/12.01/15/14 TSH 2.231    Family/ Staff Communication: observe the patient.   Goals of Care: SNF  Labs/tests ordered: none

## 2014-03-30 NOTE — Assessment & Plan Note (Signed)
Has been on Zoloft 50mg  for over 16 years, she seems more depressed, no significant change since Zoloft 75mg . Observe the patient. 03/13/14 stable, continue Zoloft 75mg  daily.  03/30/14 stabilized.

## 2014-03-30 NOTE — Assessment & Plan Note (Signed)
Hx. PPI in the past. Omeprazole 20mg  daily. Observe.

## 2014-03-30 NOTE — Assessment & Plan Note (Signed)
Takes Levothyroxine 32mcg daily, TSH 2.076 11/22/12.01/15/14 TSH 2.231

## 2014-03-30 NOTE — Assessment & Plan Note (Signed)
Controlled, takes Clonidine 0.1mg  daily, Carvedilol  12.5mg  bid, Cardizem 120mg , Lisinopril 32m, and Furosemide 10mg 

## 2014-03-30 NOTE — Assessment & Plan Note (Signed)
Ankles/feet--takes Furosemide 10mg  daily 11/25/12. No significant weight change.

## 2014-03-30 NOTE — Assessment & Plan Note (Signed)
Controlled. dcColace 100mg  daily. Continue MiraLax daily and prn.

## 2014-05-15 ENCOUNTER — Non-Acute Institutional Stay (SKILLED_NURSING_FACILITY): Payer: Medicare Other | Admitting: Nurse Practitioner

## 2014-05-15 DIAGNOSIS — I10 Essential (primary) hypertension: Secondary | ICD-10-CM | POA: Diagnosis not present

## 2014-05-15 DIAGNOSIS — H109 Unspecified conjunctivitis: Secondary | ICD-10-CM

## 2014-05-15 DIAGNOSIS — R609 Edema, unspecified: Secondary | ICD-10-CM | POA: Diagnosis not present

## 2014-05-15 DIAGNOSIS — H6122 Impacted cerumen, left ear: Secondary | ICD-10-CM | POA: Diagnosis not present

## 2014-05-15 DIAGNOSIS — F329 Major depressive disorder, single episode, unspecified: Secondary | ICD-10-CM | POA: Diagnosis not present

## 2014-05-15 DIAGNOSIS — K219 Gastro-esophageal reflux disease without esophagitis: Secondary | ICD-10-CM | POA: Diagnosis not present

## 2014-05-15 DIAGNOSIS — M546 Pain in thoracic spine: Secondary | ICD-10-CM | POA: Diagnosis not present

## 2014-05-15 DIAGNOSIS — F32A Depression, unspecified: Secondary | ICD-10-CM

## 2014-05-15 DIAGNOSIS — E039 Hypothyroidism, unspecified: Secondary | ICD-10-CM

## 2014-05-15 DIAGNOSIS — K5900 Constipation, unspecified: Secondary | ICD-10-CM | POA: Diagnosis not present

## 2014-05-15 DIAGNOSIS — G4700 Insomnia, unspecified: Secondary | ICD-10-CM | POA: Diagnosis not present

## 2014-05-15 NOTE — Assessment & Plan Note (Signed)
Controlled, takes Clonidine 0.1mg  daily, Carvedilol  12.5mg  bid, Cardizem 120mg , Lisinopril 73m, and Furosemide 10mg  qod

## 2014-05-15 NOTE — Assessment & Plan Note (Signed)
Ankles/feet--takes Furosemide 10mg  qod. No significant weight change.

## 2014-05-15 NOTE — Progress Notes (Signed)
Patient ID: Dana George, female   DOB: June 23, 1923, 80 y.o.   MRN: [id number]   Code Status: DNR  No Known Allergies  Chief Complaint  Patient presents with  . Medical Management of Chronic Issues  . Acute Visit    injected eyes    HPI: Patient is a 81 y.o. female seen in the SNF at [facility] today for evaluation of redness of R+L eyes, c/o left ear wax, and chronic medical conditions.    Hospitalized 12/18/2013-12/20/2013 for COPD exacerbation  Essential hypertension Thoracic spine fracture, closed  Problem List Items Addressed This Visit    Insomnia    Prn Ambien is needed.       Impacted cerumen of left ear - Primary    Left ear-no pain upon exam-will have cerumen removed follow standing procedure.       Hypothyroidism    Takes Levothyroxine 7mcg daily, TSH 2.076 11/22/12.01/15/14 TSH 2.231      HTN (hypertension)    Controlled, takes Clonidine 0.1mg  daily, Carvedilol  12.5mg  bid, Cardizem 120mg , Lisinopril 26m, and Furosemide 10mg  qod        GERD (gastroesophageal reflux disease)    Hx. PPI in the past. Omeprazole 20mg  daily. Observe.  --stable       Edema    Ankles/feet--takes Furosemide 10mg  qod. No significant weight change.         Depression    Has been on Zoloft 50mg  for over 16 years, she seems more depressed, no significant change since Zoloft 75mg . Observe the patient. 03/13/14 stable, continue Zoloft 75mg  daily.  --mood is stabilized      Constipation    Controlled. Continue MiraLax daily and prn.          Conjunctivitis    Improved but not resolved on Naphcon A-prn 2/20-2/27 up to 4 x day, adding tears qid x 4 weeks. Small amount clear drainage seen.       Back pain, thoracic    0/16/15 T 10 Dr. Newman Pies: PCP managing pain.  01/12/14 schedule Norco q am and continue prn q6h -- pain is reasonably controlled.            Review of Systems:  Review of Systems  Constitutional: Negative for fever, chills, weight  loss, malaise/fatigue and diaphoresis.  HENT: Positive for hearing loss. Negative for congestion, ear discharge, ear pain, nosebleeds, sore throat and tinnitus.   Eyes: Positive for discharge and redness. Negative for blurred vision, double vision, photophobia and pain.  Respiratory: Positive for cough. Negative for hemoptysis, sputum production, shortness of breath, wheezing and stridor.   Cardiovascular: Positive for leg swelling. Negative for chest pain, palpitations, orthopnea, claudication and PND.  Gastrointestinal: Negative for heartburn, nausea, vomiting, abdominal pain, diarrhea, constipation, blood in stool and melena.  Genitourinary: Positive for frequency. Negative for dysuria, urgency, hematuria and flank pain.  Musculoskeletal: Positive for back pain. Negative for myalgias, joint pain, falls and neck pain.       Ambulates with walker.   Skin: Negative for itching and rash.  Neurological: Negative for dizziness, tingling, tremors, sensory change, speech change, focal weakness, seizures, loss of consciousness, weakness and headaches.  Endo/Heme/Allergies: Negative for environmental allergies and polydipsia. Does not bruise/bleed easily.  Psychiatric/Behavioral: Positive for memory loss. Negative for depression, suicidal ideas, hallucinations and substance abuse. The patient is nervous/anxious and has insomnia.    (type .ros)  Past Medical History  Diagnosis Date  . Hypothyroidism   . Hypertension   . Shortness of breath   .  COPD (chronic obstructive pulmonary disease)   . Osteoporosis   . Cancer 1980    Breast cancer  . TIA (transient ischemic attack)    Past Surgical History  Procedure Laterality Date  . Abdominal hysterectomy    . Total knee arthroplasty  2010    left knee  . Mastectomy  1980    left side  . Tonsillectomy    . Exploratory laparotomy w/ bowel resection      10/19/12-11/01/12 [facility] for exploratory laparotomty with extensive lysis of adhesion  and segmental small bowel resection with end to end anastomosis per Dr. Isidore Moos 10/22/12  . Cataract extraction, bilateral     Social History:   reports that she quit smoking about 28 years ago. She does not have any smokeless tobacco history on file. She reports that she does not drink alcohol or use illicit drugs.  Family History  Problem Relation Age of Onset  . High blood pressure Mother   . Osteoporosis Mother   . Heart attack Father     d/o 62    Medications: Patient's Medications  New Prescriptions   No medications on file  Previous Medications   ACETAMINOPHEN (TYLENOL) 325 MG TABLET    Take 650 mg by mouth every 4 (four) hours as needed for moderate pain.   ASPIRIN 81 MG CHEWABLE TABLET    Chew 81 mg by mouth every morning.   CARVEDILOL (COREG) 12.5 MG TABLET    Take 12.5 mg by mouth 2 (two) times daily with a meal.   CLONIDINE (CATAPRES) 0.1 MG TABLET    Take 0.1 mg by mouth every morning.    DILTIAZEM (CARDIZEM CD) 120 MG 24 HR CAPSULE    Take 120 mg by mouth daily.    DOCUSATE SODIUM (COLACE) 100 MG CAPSULE    Take 1 capsule (100 mg total) by mouth 2 (two) times daily.   FUROSEMIDE (LASIX) 20 MG TABLET    Take 10 mg by mouth every other day.    HYDROCODONE-ACETAMINOPHEN (NORCO/VICODIN) 5-325 MG PER TABLET    Take one tablet by mouth every 6 hours as needed for pain   LEVOTHYROXINE (SYNTHROID, LEVOTHROID) 50 MCG TABLET    Take 50 mcg by mouth every morning.    LISINOPRIL (PRINIVIL,ZESTRIL) 40 MG TABLET    Take 20 mg by mouth every morning.    MELATONIN 3 MG TBDP    Take 3 mg by mouth at bedtime.   MULTIPLE VITAMIN (MULTIVITAMIN WITH MINERALS) TABS TABLET    Take 1 tablet by mouth every morning.   OMEPRAZOLE (PRILOSEC) 20 MG CAPSULE    Take 20 mg by mouth daily.   POLYETHYLENE GLYCOL (MIRALAX / GLYCOLAX) PACKET    Take 17 g by mouth daily as needed.    SERTRALINE (ZOLOFT) 50 MG TABLET    Take 50 mg by mouth at bedtime.   TIOTROPIUM (SPIRIVA) 18 MCG INHALATION CAPSULE    Place  18 mcg into inhaler and inhale daily.  Modified Medications   No medications on file  Discontinued Medications   No medications on file     Physical Exam: Physical Exam  Constitutional: She is oriented to person, place, and time. She appears well-developed and well-nourished. No distress.  HENT:  Head: Normocephalic and atraumatic.  Right Ear: External ear normal.  Left Ear: External ear normal.  Nose: Nose normal.  Mouth/Throat: Oropharynx is clear and moist. No oropharyngeal exudate.  Eyes: Conjunctivae and EOM are normal. Pupils are equal, round, and reactive  to light. Right eye exhibits no discharge. Left eye exhibits no discharge. No scleral icterus.  Neck: Normal range of motion. Neck supple. No JVD present. No tracheal deviation present. No thyromegaly present.  Cardiovascular: Normal rate, regular rhythm, normal heart sounds and intact distal pulses.   No murmur heard. Pulmonary/Chest: Effort normal. No stridor. No respiratory distress. She has decreased breath sounds. She has no wheezes. She has rales. She exhibits no tenderness.  bibasilar  Abdominal: Soft. She exhibits no distension and no mass. There is no tenderness. There is no rebound and no guarding.  Musculoskeletal: Normal range of motion. She exhibits edema and tenderness.  Ankles and feet trace. Mid back pain.   Lymphadenopathy:    She has no cervical adenopathy.  Neurological: She is alert and oriented to person, place, and time. She has normal reflexes. No cranial nerve deficit. She exhibits normal muscle tone. Coordination normal.  Skin: Skin is warm and dry. No rash noted. She is not diaphoretic. No erythema. No pallor.  Mid abd surgical scar-healed. Left mastectomy Scalp skin lesion above the left ear.   Psychiatric: She has a normal mood and affect. Her behavior is normal. Judgment and thought content normal.  Repetitive.     Filed Vitals:   05/15/14 1614  BP: 130/62  Pulse: 64  Temp: 97 F (36.1 C)    TempSrc: Tympanic  Resp: 20      Labs reviewed: Basic Metabolic Panel:  Recent Labs  12/09/13 0755 12/18/13 1621 12/19/13 0404 01/15/14  NA 141 144 141 139  K 3.9 3.5* 3.7 4.8  CL 101 100 102  --   CO2 27 26 27   --   GLUCOSE 117* 93 122*  --   BUN 20 13 21 14   CREATININE 0.64 0.56 0.74 0.6  CALCIUM 9.6 10.1 9.2  --   TSH  --   --   --  2.23   Liver Function Tests:  Recent Labs  12/18/13 1621 01/15/14  AST 20 18  ALT 16 17  ALKPHOS 102 89  BILITOT 0.5  --   PROT 7.5  --   ALBUMIN 4.4  --    No results for input(s): LIPASE, AMYLASE in the last 8760 hours. No results for input(s): AMMONIA in the last 8760 hours. CBC:  Recent Labs  12/09/13 0755 12/18/13 1621 12/19/13 0404 12/20/13 0402 01/15/14  WBC 9.1 6.5 8.3 6.1 5.3  NEUTROABS 7.1 4.6  --   --   --   HGB 15.9* 17.2* 15.5* 15.2* 14.9  HCT 47.8* 51.4* 47.6* 45.9 45  MCV 97.4 94.8 96.4 96.6  --   PLT 390 450* 427* 419* 440*   Lipid Panel: No results for input(s): CHOL, HDL, LDLCALC, TRIG, CHOLHDL, LDLDIRECT in the last 8760 hours.  Past Procedures:  12/18/13 CT head w/o CM:    IMPRESSION:  1. There is no acute intracranial hemorrhage nor other acute  abnormality of the brain. There are extensive changes of chronic  small vessel ischemia.  2. There is no acute skull fracture.   12/18/13 X-ray Chest, Lumbar spine, Thoracic spine:  IMPRESSION: T5 compression fracture stable since 12/10/2008.   T10 compression fracture appears new since 12/09/2013.  IMPRESSION: No evidence for lumbar spine fracture.   Compression deformity at the level of T10. Comparing back to a chest x-ray from 12/09/2013, this appears new in the interval.  IMPRESSION: Collapse of the T3 and T8 vertebral bodies which may be recent. Old healed fracture of left lateral seventh  rib. Lungs clear. No pneumothorax.  Assessment/Plan Impacted cerumen of left ear Left ear-no pain upon exam-will have cerumen removed follow  standing procedure.    Conjunctivitis Improved but not resolved on Naphcon A-prn 2/20-2/27 up to 4 x day, adding tears qid x 4 weeks. Small amount clear drainage seen.    Edema Ankles/feet--takes Furosemide 10mg  qod. No significant weight change.      Hypothyroidism Takes Levothyroxine 53mcg daily, TSH 2.076 11/22/12.01/15/14 TSH 2.231   GERD (gastroesophageal reflux disease) Hx. PPI in the past. Omeprazole 20mg  daily. Observe.  --stable    HTN (hypertension) Controlled, takes Clonidine 0.1mg  daily, Carvedilol  12.5mg  bid, Cardizem 120mg , Lisinopril 41m, and Furosemide 10mg  qod     Constipation Controlled. Continue MiraLax daily and prn.       Depression Has been on Zoloft 50mg  for over 16 years, she seems more depressed, no significant change since Zoloft 75mg . Observe the patient. 03/13/14 stable, continue Zoloft 75mg  daily.  --mood is stabilized   Back pain, thoracic 0/16/15 T 10 Dr. Newman Pies: PCP managing pain.  01/12/14 schedule Norco q am and continue prn q6h -- pain is reasonably controlled.      Insomnia Prn Ambien is needed.      Family/ Staff Communication: observe the patient.   Goals of Care: SNF  Labs/tests ordered: none

## 2014-05-15 NOTE — Assessment & Plan Note (Signed)
Controlled. Continue MiraLax daily and prn.

## 2014-05-15 NOTE — Assessment & Plan Note (Signed)
Has been on Zoloft 50mg  for over 16 years, she seems more depressed, no significant change since Zoloft 75mg . Observe the patient. 03/13/14 stable, continue Zoloft 75mg  daily.  --mood is stabilized

## 2014-05-15 NOTE — Assessment & Plan Note (Signed)
Left ear-no pain upon exam-will have cerumen removed follow standing procedure.

## 2014-05-15 NOTE — Assessment & Plan Note (Signed)
Hx. PPI in the past. Omeprazole 20mg  daily. Observe.  --stable

## 2014-05-15 NOTE — Assessment & Plan Note (Signed)
Takes Levothyroxine 63mcg daily, TSH 2.076 11/22/12.01/15/14 TSH 2.231

## 2014-05-15 NOTE — Assessment & Plan Note (Signed)
Improved but not resolved on Naphcon A-prn 2/20-2/27 up to 4 x day, adding tears qid x 4 weeks. Small amount clear drainage seen.

## 2014-05-15 NOTE — Assessment & Plan Note (Signed)
Prn Ambien is needed.

## 2014-05-15 NOTE — Assessment & Plan Note (Signed)
0/16/15 T 10 Dr. Newman Pies: PCP managing pain.  01/12/14 schedule Norco q am and continue prn q6h -- pain is reasonably controlled.

## 2014-05-25 ENCOUNTER — Non-Acute Institutional Stay (SKILLED_NURSING_FACILITY): Payer: Medicare Other | Admitting: Nurse Practitioner

## 2014-05-25 DIAGNOSIS — F32A Depression, unspecified: Secondary | ICD-10-CM

## 2014-05-25 DIAGNOSIS — K5900 Constipation, unspecified: Secondary | ICD-10-CM

## 2014-05-25 DIAGNOSIS — G4700 Insomnia, unspecified: Secondary | ICD-10-CM | POA: Diagnosis not present

## 2014-05-25 DIAGNOSIS — M546 Pain in thoracic spine: Secondary | ICD-10-CM

## 2014-05-25 DIAGNOSIS — H109 Unspecified conjunctivitis: Secondary | ICD-10-CM | POA: Diagnosis not present

## 2014-05-25 DIAGNOSIS — K432 Incisional hernia without obstruction or gangrene: Secondary | ICD-10-CM

## 2014-05-25 DIAGNOSIS — F329 Major depressive disorder, single episode, unspecified: Secondary | ICD-10-CM

## 2014-05-25 DIAGNOSIS — E039 Hypothyroidism, unspecified: Secondary | ICD-10-CM

## 2014-05-25 DIAGNOSIS — K219 Gastro-esophageal reflux disease without esophagitis: Secondary | ICD-10-CM

## 2014-05-25 DIAGNOSIS — I10 Essential (primary) hypertension: Secondary | ICD-10-CM | POA: Diagnosis not present

## 2014-05-25 DIAGNOSIS — R609 Edema, unspecified: Secondary | ICD-10-CM | POA: Diagnosis not present

## 2014-05-27 ENCOUNTER — Encounter: Payer: Self-pay | Admitting: Nurse Practitioner

## 2014-05-27 DIAGNOSIS — K432 Incisional hernia without obstruction or gangrene: Secondary | ICD-10-CM

## 2014-05-27 HISTORY — DX: Incisional hernia without obstruction or gangrene: K43.2

## 2014-05-27 NOTE — Assessment & Plan Note (Signed)
Ankles/feet--takes Furosemide 10mg  qod. No significant weight change.

## 2014-05-27 NOTE — Progress Notes (Signed)
Patient ID: Dana George, female   DOB: 1923-05-09, 81 y.o.   MRN: [id number]   Code Status: DNR  No Known Allergies  Chief Complaint  Patient presents with  . Medical Management of Chronic Issues  . Acute Visit    requested to see the patient for abd hernia.     HPI: Patient is a 81 y.o. female seen in the SNF at [facility] today for evaluation of abd incisional hernia and chronic medical conditions.    Hospitalized 12/18/2013-12/20/2013 for COPD exacerbation  Essential hypertension Thoracic spine fracture, closed  Problem List Items Addressed This Visit    Insomnia    Prn Ambien is needed.        Incisional hernia, without obstruction or gangrene - Primary    Lower abd from previous surgery-2-3 small incisional hernias which are reducible-no incarceration or pain complained. Continue to observe the patient.        Hypothyroidism    Takes Levothyroxine 49mcg daily, TSH 2.076 11/22/12.01/15/14 TSH 2.231       HTN (hypertension)    Controlled, takes Clonidine 0.1mg  daily, Carvedilol  12.5mg  bid, Cardizem 120mg , Lisinopril 29m, and Furosemide 10mg  qod        GERD (gastroesophageal reflux disease)    Hx. PPI in the past. Omeprazole 20mg  daily. Observe.  --stable        Edema    Ankles/feet--takes Furosemide 10mg  qod. No significant weight change.          Depression    Has been on Zoloft 50mg  for over 16 years, she seems more depressed, no significant change since Zoloft 75mg . Observe the patient. 03/13/14 stable, continue Zoloft 75mg  daily.  --mood is stabilized       Constipation    Controlled. Continue MiraLax daily and prn.        Conjunctivitis    Much improved-near resolution.       Back pain, thoracic    0/16/15 T 10 Dr. Newman Pies: PCP managing pain.  01/12/14 schedule Norco q am and continue prn q6h -- pain is reasonably controlled.            Review of Systems:  Review of Systems  Constitutional: Negative for fever,  chills, weight loss, malaise/fatigue and diaphoresis.  HENT: Positive for hearing loss. Negative for congestion, ear discharge, ear pain, nosebleeds, sore throat and tinnitus.   Eyes: Negative for blurred vision, double vision, photophobia, pain, discharge and redness.  Respiratory: Negative for cough, hemoptysis, sputum production, shortness of breath, wheezing and stridor.   Cardiovascular: Positive for leg swelling. Negative for chest pain, palpitations, orthopnea, claudication and PND.       Trace in ankles.   Gastrointestinal: Negative for heartburn, nausea, vomiting, abdominal pain, diarrhea, constipation, blood in stool and melena.       Lower abd incisional hernia.   Genitourinary: Positive for frequency. Negative for dysuria, urgency, hematuria and flank pain.  Musculoskeletal: Positive for back pain. Negative for myalgias, joint pain, falls and neck pain.       Improved back pain. She is able to ambulates with walker on unit.   Skin: Negative for itching and rash.       Lower abd incisional hernia-reducible   Neurological: Negative for dizziness, tingling, tremors, sensory change, speech change, focal weakness, seizures, loss of consciousness, weakness and headaches.  Endo/Heme/Allergies: Negative for environmental allergies and polydipsia. Does not bruise/bleed easily.  Psychiatric/Behavioral: Positive for memory loss. Negative for depression, suicidal ideas, hallucinations and substance abuse. The  patient is not nervous/anxious and does not have insomnia.     Past Medical History  Diagnosis Date  . Hypothyroidism   . Hypertension   . Shortness of breath   . COPD (chronic obstructive pulmonary disease)   . Osteoporosis   . Cancer 1980    Breast cancer  . TIA (transient ischemic attack)    Past Surgical History  Procedure Laterality Date  . Abdominal hysterectomy    . Total knee arthroplasty  2010    left knee  . Mastectomy  1980    left side  . Tonsillectomy    .  Exploratory laparotomy w/ bowel resection      10/19/12-11/01/12 [facility] for exploratory laparotomty with extensive lysis of adhesion and segmental small bowel resection with end to end anastomosis per Dr. Isidore Moos 10/22/12  . Cataract extraction, bilateral     Social History:   reports that she quit smoking about 28 years ago. She does not have any smokeless tobacco history on file. She reports that she does not drink alcohol or use illicit drugs.  Family History  Problem Relation Age of Onset  . High blood pressure Mother   . Osteoporosis Mother   . Heart attack Father     d/o 18    Medications: Patient's Medications  New Prescriptions   No medications on file  Previous Medications   ACETAMINOPHEN (TYLENOL) 325 MG TABLET    Take 650 mg by mouth every 4 (four) hours as needed for moderate pain.   ASPIRIN 81 MG CHEWABLE TABLET    Chew 81 mg by mouth every morning.   CARVEDILOL (COREG) 12.5 MG TABLET    Take 12.5 mg by mouth 2 (two) times daily with a meal.   CLONIDINE (CATAPRES) 0.1 MG TABLET    Take 0.1 mg by mouth every morning.    DILTIAZEM (CARDIZEM CD) 120 MG 24 HR CAPSULE    Take 120 mg by mouth daily.    DOCUSATE SODIUM (COLACE) 100 MG CAPSULE    Take 1 capsule (100 mg total) by mouth 2 (two) times daily.   FUROSEMIDE (LASIX) 20 MG TABLET    Take 10 mg by mouth every other day.    HYDROCODONE-ACETAMINOPHEN (NORCO/VICODIN) 5-325 MG PER TABLET    Take one tablet by mouth every 6 hours as needed for pain   LEVOTHYROXINE (SYNTHROID, LEVOTHROID) 50 MCG TABLET    Take 50 mcg by mouth every morning.    LISINOPRIL (PRINIVIL,ZESTRIL) 40 MG TABLET    Take 20 mg by mouth every morning.    MELATONIN 3 MG TBDP    Take 3 mg by mouth at bedtime.   MULTIPLE VITAMIN (MULTIVITAMIN WITH MINERALS) TABS TABLET    Take 1 tablet by mouth every morning.   OMEPRAZOLE (PRILOSEC) 20 MG CAPSULE    Take 20 mg by mouth daily.   POLYETHYLENE GLYCOL (MIRALAX / GLYCOLAX) PACKET    Take 17 g by mouth daily as  needed.    SERTRALINE (ZOLOFT) 50 MG TABLET    Take 50 mg by mouth at bedtime.   TIOTROPIUM (SPIRIVA) 18 MCG INHALATION CAPSULE    Place 18 mcg into inhaler and inhale daily.  Modified Medications   No medications on file  Discontinued Medications   No medications on file     Physical Exam: Physical Exam  Constitutional: She is oriented to person, place, and time. She appears well-developed and well-nourished. No distress.  HENT:  Head: Normocephalic and atraumatic.  Right Ear: External  ear normal.  Left Ear: External ear normal.  Nose: Nose normal.  Mouth/Throat: Oropharynx is clear and moist. No oropharyngeal exudate.  Eyes: Conjunctivae and EOM are normal. Pupils are equal, round, and reactive to light. Right eye exhibits no discharge. Left eye exhibits no discharge. No scleral icterus.  Neck: Normal range of motion. Neck supple. No JVD present. No tracheal deviation present. No thyromegaly present.  Cardiovascular: Normal rate, regular rhythm, normal heart sounds and intact distal pulses.   No murmur heard. Pulmonary/Chest: Effort normal. No stridor. No respiratory distress. She has decreased breath sounds. She has no wheezes. She has rales. She exhibits no tenderness.  bibasilar  Abdominal: Soft. She exhibits no distension and no mass. There is no tenderness. There is no rebound and no guarding.  Musculoskeletal: Normal range of motion. She exhibits edema and tenderness.  Ankles and feet trace. Mid back pain.   Lymphadenopathy:    She has no cervical adenopathy.  Neurological: She is alert and oriented to person, place, and time. She has normal reflexes. No cranial nerve deficit. She exhibits normal muscle tone. Coordination normal.  Skin: Skin is warm and dry. No rash noted. She is not diaphoretic. No erythema. No pallor.  Mid abd surgical scar-healed-lower abd reducible incisional hernia-the patient has concerns of cosmetic reason but denied pain.  Left mastectomy Scalp skin  lesion above the left ear.   Psychiatric: She has a normal mood and affect. Her behavior is normal. Judgment and thought content normal.  Repetitive.     Filed Vitals:   05/25/14 1101  BP: 134/86  Pulse: 66  Temp: 97.7 F (36.5 C)  TempSrc: Tympanic  Resp: 20      Labs reviewed: Basic Metabolic Panel:  Recent Labs  12/09/13 0755 12/18/13 1621 12/19/13 0404 01/15/14  NA 141 144 141 139  K 3.9 3.5* 3.7 4.8  CL 101 100 102  --   CO2 27 26 27   --   GLUCOSE 117* 93 122*  --   BUN 20 13 21 14   CREATININE 0.64 0.56 0.74 0.6  CALCIUM 9.6 10.1 9.2  --   TSH  --   --   --  2.23   Liver Function Tests:  Recent Labs  12/18/13 1621 01/15/14  AST 20 18  ALT 16 17  ALKPHOS 102 89  BILITOT 0.5  --   PROT 7.5  --   ALBUMIN 4.4  --    No results for input(s): LIPASE, AMYLASE in the last 8760 hours. No results for input(s): AMMONIA in the last 8760 hours. CBC:  Recent Labs  12/09/13 0755 12/18/13 1621 12/19/13 0404 12/20/13 0402 01/15/14  WBC 9.1 6.5 8.3 6.1 5.3  NEUTROABS 7.1 4.6  --   --   --   HGB 15.9* 17.2* 15.5* 15.2* 14.9  HCT 47.8* 51.4* 47.6* 45.9 45  MCV 97.4 94.8 96.4 96.6  --   PLT 390 450* 427* 419* 440*   Lipid Panel: No results for input(s): CHOL, HDL, LDLCALC, TRIG, CHOLHDL, LDLDIRECT in the last 8760 hours.  Past Procedures:  12/18/13 CT head w/o CM:    IMPRESSION:  1. There is no acute intracranial hemorrhage nor other acute  abnormality of the brain. There are extensive changes of chronic  small vessel ischemia.  2. There is no acute skull fracture.   12/18/13 X-ray Chest, Lumbar spine, Thoracic spine:  IMPRESSION: T5 compression fracture stable since 12/10/2008.   T10 compression fracture appears new since 12/09/2013.  IMPRESSION: No evidence  for lumbar spine fracture.   Compression deformity at the level of T10. Comparing back to a chest x-ray from 12/09/2013, this appears new in the interval.  IMPRESSION: Collapse of the T3  and T8 vertebral bodies which may be recent. Old healed fracture of left lateral seventh rib. Lungs clear. No pneumothorax.  Assessment/Plan Incisional hernia, without obstruction or gangrene Lower abd from previous surgery-2-3 small incisional hernias which are reducible-no incarceration or pain complained. Continue to observe the patient.     Back pain, thoracic 0/16/15 T 10 Dr. Newman Pies: PCP managing pain.  01/12/14 schedule Norco q am and continue prn q6h -- pain is reasonably controlled.      Conjunctivitis Much improved-near resolution.    Constipation Controlled. Continue MiraLax daily and prn.     Depression Has been on Zoloft 50mg  for over 16 years, she seems more depressed, no significant change since Zoloft 75mg . Observe the patient. 03/13/14 stable, continue Zoloft 75mg  daily.  --mood is stabilized    Edema Ankles/feet--takes Furosemide 10mg  qod. No significant weight change.       GERD (gastroesophageal reflux disease) Hx. PPI in the past. Omeprazole 20mg  daily. Observe.  --stable     HTN (hypertension) Controlled, takes Clonidine 0.1mg  daily, Carvedilol  12.5mg  bid, Cardizem 120mg , Lisinopril 75m, and Furosemide 10mg  qod     Hypothyroidism Takes Levothyroxine 68mcg daily, TSH 2.076 11/22/12.01/15/14 TSH 2.231    Insomnia Prn Ambien is needed.       Family/ Staff Communication: observe the patient.   Goals of Care: SNF  Labs/tests ordered: none

## 2014-05-27 NOTE — Assessment & Plan Note (Signed)
Controlled. Continue MiraLax daily and prn.

## 2014-05-27 NOTE — Assessment & Plan Note (Signed)
Takes Levothyroxine 57mcg daily, TSH 2.076 11/22/12.01/15/14 TSH 2.231

## 2014-05-27 NOTE — Assessment & Plan Note (Signed)
Controlled, takes Clonidine 0.1mg  daily, Carvedilol  12.5mg  bid, Cardizem 120mg , Lisinopril 69m, and Furosemide 10mg  qod

## 2014-05-27 NOTE — Assessment & Plan Note (Signed)
Prn Ambien is needed.

## 2014-05-27 NOTE — Assessment & Plan Note (Signed)
Hx. PPI in the past. Omeprazole 20mg  daily. Observe.  --stable

## 2014-05-27 NOTE — Assessment & Plan Note (Signed)
0/16/15 T 10 Dr. Newman Pies: PCP managing pain.  01/12/14 schedule Norco q am and continue prn q6h -- pain is reasonably controlled.

## 2014-05-27 NOTE — Assessment & Plan Note (Signed)
Has been on Zoloft 50mg  for over 16 years, she seems more depressed, no significant change since Zoloft 75mg . Observe the patient. 03/13/14 stable, continue Zoloft 75mg  daily.  --mood is stabilized

## 2014-05-27 NOTE — Assessment & Plan Note (Addendum)
Lower abd from previous surgery-2-3 small incisional hernias which are reducible-no incarceration or pain complained. Continue to observe the patient.

## 2014-05-27 NOTE — Assessment & Plan Note (Signed)
Much improved-near resolution.

## 2014-06-08 ENCOUNTER — Encounter: Payer: Self-pay | Admitting: Internal Medicine

## 2014-06-08 ENCOUNTER — Non-Acute Institutional Stay (SKILLED_NURSING_FACILITY): Payer: Medicare Other | Admitting: Internal Medicine

## 2014-06-08 DIAGNOSIS — I639 Cerebral infarction, unspecified: Secondary | ICD-10-CM | POA: Diagnosis not present

## 2014-06-08 DIAGNOSIS — S22009S Unspecified fracture of unspecified thoracic vertebra, sequela: Secondary | ICD-10-CM

## 2014-06-08 DIAGNOSIS — E039 Hypothyroidism, unspecified: Secondary | ICD-10-CM

## 2014-06-08 DIAGNOSIS — I10 Essential (primary) hypertension: Secondary | ICD-10-CM | POA: Diagnosis not present

## 2014-06-08 DIAGNOSIS — H109 Unspecified conjunctivitis: Secondary | ICD-10-CM

## 2014-06-08 DIAGNOSIS — R609 Edema, unspecified: Secondary | ICD-10-CM | POA: Diagnosis not present

## 2014-06-08 DIAGNOSIS — K439 Ventral hernia without obstruction or gangrene: Secondary | ICD-10-CM

## 2014-06-08 DIAGNOSIS — G8190 Hemiplegia, unspecified affecting unspecified side: Secondary | ICD-10-CM

## 2014-06-08 DIAGNOSIS — L920 Granuloma annulare: Secondary | ICD-10-CM | POA: Diagnosis not present

## 2014-06-08 DIAGNOSIS — G8194 Hemiplegia, unspecified affecting left nondominant side: Secondary | ICD-10-CM

## 2014-06-08 HISTORY — DX: Hemiplegia, unspecified affecting left nondominant side: G81.94

## 2014-06-08 HISTORY — DX: Ventral hernia without obstruction or gangrene: K43.9

## 2014-06-08 HISTORY — DX: Granuloma annulare: L92.0

## 2014-06-08 NOTE — Progress Notes (Signed)
Patient ID: Dana George, female   DOB: [birth year], 81 y.o.   MRN: [id number]    HISTORY AND PHYSICAL  Location:  [place] Room Number: N 20 Place of Service: SNF (31)   Extended Emergency Contact Information Primary Emergency Contact: Johnston,Betsy Address: [place], [place] Phone: [phone] Relation: Daughter Secondary Emergency Contact: Rey,Lindsey  [place] Phone: [phone] Relation: Daughter  Advanced Directive information Does patient have an advance directive?: Yes, Type of Advance Directive: Living will;Out of facility DNR (pink MOST or yellow form), Pre-existing out of facility DNR order (yellow form or pink MOST form): Pink MOST form placed in chart (order not valid for inpatient use);Yellow form placed in chart (order not valid for inpatient use), Does patient want to make changes to advanced directive?: No - Patient declined  Chief Complaint  Patient presents with  . Medical Management of Chronic Issues    HPI:  This patient was admitted to SNF at [facility] 12/20/2013. This was a time that I was on vacation. And it does not appear that she has been seen by a physician since admission. Manxie Mast, nurse practitioner has been seeing her on a regular basis.  My initial interview with this patient took place today with a full medical exam.  She has chronic back discomfort from top to bottom and a history of a fracture at T10 prior to the admission last fall 2015. She continues to have some discomfort, but states that her current pain medications to give her adequate relief.  Additional diagnoses were entered for a stroke that apparently occurred about 2014 following surgery for obstructed bowel. It left her with a left hemiparesis. She also has some hoarseness to her voice.  The bowel surgery was successful in relieving the obstruction, but since then, she  has had a ventral hernia which has enlarged. There is no pain associated with this. She has some constipation, but current medications help that problem.  Past Medical History  Diagnosis Date  . Hypothyroidism   . Hypertension   . Shortness of breath   . COPD (chronic obstructive pulmonary disease)   . Osteoporosis   . Cancer 1980    Breast cancer  . TIA (transient ischemic attack)   . Incisional hernia, without obstruction or gangrene 05/27/2014    Lower abd from previous surgery-2-3 small incisional hernias which are reducible-no incarceration or pain complained.    . Insomnia 11/18/2012  . Thoracic spine fracture 12/18/2013    01/05/14 T 10 Dr. Newman Pies: PCP managing pain.  F/u 2 months(suggested that if the pain is "not too bad" and seems to be improving that she "live with it" and give a change to heal without intervention.  12/18/13 X-ray Thoracic spine: Compression deformity at the level of T10. Comparing back to a chest x-ray from 12/09/2013, this appears new in the interval.       . Back pain, thoracic 01/12/2014  . Left hemiparesis 06/08/2014  . CVA (cerebral infarction) 06/08/2011    Following abdominal surgery for bowel obstruction   . Granuloma annulare 06/08/2014  . Constipation 11/18/2012  . Depression 12/26/2013  . DVT (deep venous thrombosis) 11/18/2012  . Fall 02/07/2013  . GERD (gastroesophageal reflux disease) 03/30/2014  . HTN (hypertension) 11/18/2012    01/15/14 Bun/creat 14/0.60   . Ventral hernia 06/08/2014    Past Surgical  History  Procedure Laterality Date  . Abdominal hysterectomy    . Total knee arthroplasty  2010    left knee  . Mastectomy  1980    left side  . Tonsillectomy    . Exploratory laparotomy w/ bowel resection      10/19/12-11/01/12 [facility] for exploratory laparotomty with extensive lysis of adhesion and segmental small bowel resection with end to end anastomosis per Dr. Isidore Moos 10/22/12  . Cataract extraction, bilateral      Patient  Care Team: Estill Dooms, MD as PCP - General (Internal Medicine) Gaynelle Arabian, MD as Consulting Physician (Orthopedic Surgery)  History   Social History  . Marital Status: Widowed    Spouse Name: N/A  . Number of Children: N/A  . Years of Education: N/A   Occupational History  . Not on file.   Social History Main Topics  . Smoking status: Former Smoker -- 1.00 packs/day for 45 years    Quit date: 01/26/1986  . Smokeless tobacco: Not on file  . Alcohol Use: No     Comment: previously  . Drug Use: No  . Sexual Activity: Not on file   Other Topics Concern  . Not on file   Social History Narrative   [facility], using a walker prior to fall, bedridden.  PT/OT before the fall.       reports that she quit smoking about 28 years ago. She does not have any smokeless tobacco history on file. She reports that she does not drink alcohol or use illicit drugs.  Family History  Problem Relation Age of Onset  . High blood pressure Mother   . Osteoporosis Mother   . Heart attack Father     d/o 72   No family status information on file.    Immunization History  Administered Date(s) Administered  . Influenza,inj,Quad PF,36+ Mos 12/19/2013  . Influenza-Unspecified 01/21/2013  . Pneumococcal Polysaccharide-23 11/24/1999  . Td 12/28/1994  . Tdap 04/08/2013    No Known Allergies  Medications: Patient's Medications  New Prescriptions   No medications on file  Previous Medications   ACETAMINOPHEN (TYLENOL) 325 MG TABLET    Take 650 mg by mouth every 4 (four) hours as needed for moderate pain.   ASPIRIN 81 MG CHEWABLE TABLET    Chew 81 mg by mouth every morning.   CARVEDILOL (COREG) 12.5 MG TABLET    Take 12.5 mg by mouth 2 (two) times daily with a meal.   CLONIDINE (CATAPRES) 0.1 MG TABLET    Take 0.1 mg by mouth every morning.    DILTIAZEM (CARDIZEM CD) 120 MG 24 HR CAPSULE    Take 120 mg by mouth daily.    DOCUSATE SODIUM (COLACE) 100 MG CAPSULE    Take 1 capsule  (100 mg total) by mouth 2 (two) times daily.   FUROSEMIDE (LASIX) 20 MG TABLET    Take 10 mg by mouth every other day.    HYDROCODONE-ACETAMINOPHEN (NORCO/VICODIN) 5-325 MG PER TABLET    Take one tablet by mouth every 6 hours as needed for pain   LEVOTHYROXINE (SYNTHROID, LEVOTHROID) 50 MCG TABLET    Take 50 mcg by mouth every morning.    LISINOPRIL (PRINIVIL,ZESTRIL) 40 MG TABLET    Take 20 mg by mouth every morning.    MELATONIN 3 MG TBDP    Take 3 mg by mouth at bedtime.   MULTIPLE VITAMIN (MULTIVITAMIN WITH MINERALS) TABS TABLET    Take 1 tablet by mouth every  morning.   OMEPRAZOLE (PRILOSEC) 20 MG CAPSULE    Take 20 mg by mouth daily.   POLYETHYLENE GLYCOL (MIRALAX / GLYCOLAX) PACKET    Take 17 g by mouth daily as needed.    SERTRALINE (ZOLOFT) 50 MG TABLET    Take 50 mg by mouth at bedtime.   TIOTROPIUM (SPIRIVA) 18 MCG INHALATION CAPSULE    Place 18 mcg into inhaler and inhale daily.  Modified Medications   No medications on file  Discontinued Medications   No medications on file    Review of Systems  Constitutional: Negative for fever, chills and diaphoresis.  HENT: Positive for hearing loss. Negative for congestion, ear discharge, ear pain, nosebleeds, sore throat and tinnitus.   Eyes: Negative for photophobia, pain, discharge and redness.  Respiratory: Negative for cough, shortness of breath, wheezing and stridor.   Cardiovascular: Positive for leg swelling. Negative for chest pain and palpitations.       Trace in ankles.   Gastrointestinal: Negative for nausea, vomiting, abdominal pain, diarrhea, constipation and blood in stool.       Lower abd incisional hernia.   Endocrine: Negative for polydipsia.  Genitourinary: Positive for frequency. Negative for dysuria, urgency, hematuria and flank pain.  Musculoskeletal: Positive for back pain. Negative for myalgias and neck pain.       Improved back pain. She is able to ambulates with walker on unit.   Skin: Negative for rash.        Lower abd incisional hernia-reducible   Allergic/Immunologic: Negative for environmental allergies.  Neurological: Negative for dizziness, tremors, seizures, weakness and headaches.  Hematological: Does not bruise/bleed easily.  Psychiatric/Behavioral: Negative for suicidal ideas and hallucinations. The patient is not nervous/anxious.     Filed Vitals:   06/08/14 1423  BP: 146/68  Pulse: 68  Temp: 98.6 F (37 C)  Resp: 20  Height: 5\' 2"  (1.575 m)  Weight: 125 lb (56.7 kg)  SpO2: 95%   Body mass index is 22.86 kg/(m^2).  Physical Exam  Constitutional: She is oriented to person, place, and time. She appears well-developed and well-nourished. No distress.  HENT:  Head: Normocephalic and atraumatic.  Right Ear: External ear normal.  Left Ear: External ear normal.  Nose: Nose normal.  Mouth/Throat: Oropharynx is clear and moist. No oropharyngeal exudate.  Eyes: Conjunctivae and EOM are normal. Pupils are equal, round, and reactive to light. Right eye exhibits no discharge. Left eye exhibits no discharge. No scleral icterus.  Neck: Normal range of motion. Neck supple. No JVD present. No tracheal deviation present. No thyromegaly present.  Cardiovascular: Normal rate, regular rhythm, normal heart sounds and intact distal pulses.   No murmur heard. Pulmonary/Chest: Effort normal. No stridor. No respiratory distress. She has decreased breath sounds. She has no wheezes. She has rales. She exhibits no tenderness.  bibasilar  Abdominal: Soft. She exhibits no distension and no mass. There is no tenderness. There is no rebound and no guarding.  Musculoskeletal: Normal range of motion. She exhibits edema and tenderness.  Ankles and feet have trace edema. Mid back pain.   Lymphadenopathy:    She has no cervical adenopathy.  Neurological: She is alert and oriented to person, place, and time. No cranial nerve deficit. She exhibits normal muscle tone. Coordination normal.  Left hemiparesis    Skin: Skin is warm and dry. No rash noted. She is not diaphoretic. No erythema. No pallor.  Mid abd surgical scar-healed-lower abd reducible incisional hernia-the patient has concerns of cosmetic reason but denied  pain.  Left mastectomy Scalp skin lesion above the left ear.   Psychiatric: She has a normal mood and affect. Her behavior is normal. Judgment and thought content normal.  Repetitive.      Labs reviewed: No visits with results within 3 Month(s) from this visit. Latest known visit with results is:  Lab on 01/16/2014  Component Date Value Ref Range Status  . Hemoglobin 01/15/2014 14.9  12.0 - 16.0 g/dL Final  . HCT 01/15/2014 45  36 - 46 % Final  . Platelets 01/15/2014 440* 150 - 399 K/L Final  . WBC 01/15/2014 5.3   Final  . Glucose 01/15/2014 95   Final  . BUN 01/15/2014 14  4 - 21 mg/dL Final  . Creatinine 01/15/2014 0.6  0.5 - 1.1 mg/dL Final  . Potassium 01/15/2014 4.8  3.4 - 5.3 mmol/L Final  . Sodium 01/15/2014 139  137 - 147 mmol/L Final  . Alkaline Phosphatase 01/15/2014 89  25 - 125 U/L Final  . ALT 01/15/2014 17  7 - 35 U/L Final  . AST 01/15/2014 18  13 - 35 U/L Final  . Bilirubin, Total 01/15/2014 0.5   Final  . TSH 01/15/2014 2.23  0.41 - 5.90 uIU/mL Final     Assessment/Plan  1. Cerebral infarction due to unspecified mechanism Remote history in 2014. Which has left her with a residual left hemiparesis and some hoarseness. Patient's fragile enough with her hemiparesis and unstable gait as well as possible memory problems manifested by repetitive conversations at times, that we should probably view her as a long-term care patient in the skilled nursing area. She requires some assistance in dressing and bathing. She is able to use the bathroom and feed herself. She needs assistance in meal preparation and delivery as well as medication supervision.  2. Left hemiparesis As a result of stroke in 2014.  3. Granuloma annulare All diagnosis of some skin  lesions of the legs. These lesions seem to be mostly resolved now  4. Hypothyroidism, unspecified hypothyroidism type Compensated  5. Essential hypertension Controlled  6. Bilateral conjunctivitis Occurred a couple weeks ago and seems to have improved.  7. Thoracic spine fracture, sequela Residual back discomfort  8. Edema Mild. Does not need additional medication at this time.  9. Ventral hernia without obstruction or gangrene Although this was a worrisome problem to the patient, I do not see it opposes any threat to her. I have recommended that she is not a surgical candidate and she should just leave this situation as it is. The herniated area is large enough that I am doubtful that she would ever experience incarceration problems.

## 2014-06-29 DIAGNOSIS — R0602 Shortness of breath: Secondary | ICD-10-CM | POA: Diagnosis not present

## 2014-06-29 DIAGNOSIS — Z853 Personal history of malignant neoplasm of breast: Secondary | ICD-10-CM | POA: Diagnosis not present

## 2014-06-29 DIAGNOSIS — M159 Polyosteoarthritis, unspecified: Secondary | ICD-10-CM | POA: Diagnosis not present

## 2014-06-29 DIAGNOSIS — M549 Dorsalgia, unspecified: Secondary | ICD-10-CM | POA: Diagnosis not present

## 2014-06-29 DIAGNOSIS — I6350 Cerebral infarction due to unspecified occlusion or stenosis of unspecified cerebral artery: Secondary | ICD-10-CM | POA: Diagnosis not present

## 2014-06-29 DIAGNOSIS — Z96659 Presence of unspecified artificial knee joint: Secondary | ICD-10-CM | POA: Diagnosis not present

## 2014-06-29 DIAGNOSIS — M810 Age-related osteoporosis without current pathological fracture: Secondary | ICD-10-CM | POA: Diagnosis not present

## 2014-06-29 DIAGNOSIS — Z48814 Encounter for surgical aftercare following surgery on the teeth or oral cavity: Secondary | ICD-10-CM | POA: Diagnosis not present

## 2014-06-29 DIAGNOSIS — M545 Low back pain: Secondary | ICD-10-CM | POA: Diagnosis not present

## 2014-06-29 DIAGNOSIS — M6281 Muscle weakness (generalized): Secondary | ICD-10-CM | POA: Diagnosis not present

## 2014-06-29 DIAGNOSIS — G4700 Insomnia, unspecified: Secondary | ICD-10-CM | POA: Diagnosis not present

## 2014-06-29 DIAGNOSIS — J449 Chronic obstructive pulmonary disease, unspecified: Secondary | ICD-10-CM | POA: Diagnosis not present

## 2014-06-29 DIAGNOSIS — E870 Hyperosmolality and hypernatremia: Secondary | ICD-10-CM | POA: Diagnosis not present

## 2014-06-29 DIAGNOSIS — K5660 Unspecified intestinal obstruction: Secondary | ICD-10-CM | POA: Diagnosis not present

## 2014-06-29 DIAGNOSIS — K5900 Constipation, unspecified: Secondary | ICD-10-CM | POA: Diagnosis not present

## 2014-06-29 DIAGNOSIS — R601 Generalized edema: Secondary | ICD-10-CM | POA: Diagnosis not present

## 2014-07-03 DIAGNOSIS — E870 Hyperosmolality and hypernatremia: Secondary | ICD-10-CM | POA: Diagnosis not present

## 2014-07-03 DIAGNOSIS — G4700 Insomnia, unspecified: Secondary | ICD-10-CM | POA: Diagnosis not present

## 2014-07-03 DIAGNOSIS — K5660 Unspecified intestinal obstruction: Secondary | ICD-10-CM | POA: Diagnosis not present

## 2014-07-03 DIAGNOSIS — J449 Chronic obstructive pulmonary disease, unspecified: Secondary | ICD-10-CM | POA: Diagnosis not present

## 2014-07-03 DIAGNOSIS — M6281 Muscle weakness (generalized): Secondary | ICD-10-CM | POA: Diagnosis not present

## 2014-07-03 DIAGNOSIS — I6350 Cerebral infarction due to unspecified occlusion or stenosis of unspecified cerebral artery: Secondary | ICD-10-CM | POA: Diagnosis not present

## 2014-07-04 DIAGNOSIS — E870 Hyperosmolality and hypernatremia: Secondary | ICD-10-CM | POA: Diagnosis not present

## 2014-07-04 DIAGNOSIS — M6281 Muscle weakness (generalized): Secondary | ICD-10-CM | POA: Diagnosis not present

## 2014-07-04 DIAGNOSIS — J449 Chronic obstructive pulmonary disease, unspecified: Secondary | ICD-10-CM | POA: Diagnosis not present

## 2014-07-04 DIAGNOSIS — G4700 Insomnia, unspecified: Secondary | ICD-10-CM | POA: Diagnosis not present

## 2014-07-04 DIAGNOSIS — K5660 Unspecified intestinal obstruction: Secondary | ICD-10-CM | POA: Diagnosis not present

## 2014-07-04 DIAGNOSIS — I6350 Cerebral infarction due to unspecified occlusion or stenosis of unspecified cerebral artery: Secondary | ICD-10-CM | POA: Diagnosis not present

## 2014-07-05 DIAGNOSIS — E870 Hyperosmolality and hypernatremia: Secondary | ICD-10-CM | POA: Diagnosis not present

## 2014-07-05 DIAGNOSIS — G4700 Insomnia, unspecified: Secondary | ICD-10-CM | POA: Diagnosis not present

## 2014-07-05 DIAGNOSIS — K5660 Unspecified intestinal obstruction: Secondary | ICD-10-CM | POA: Diagnosis not present

## 2014-07-05 DIAGNOSIS — I6350 Cerebral infarction due to unspecified occlusion or stenosis of unspecified cerebral artery: Secondary | ICD-10-CM | POA: Diagnosis not present

## 2014-07-05 DIAGNOSIS — J449 Chronic obstructive pulmonary disease, unspecified: Secondary | ICD-10-CM | POA: Diagnosis not present

## 2014-07-05 DIAGNOSIS — M6281 Muscle weakness (generalized): Secondary | ICD-10-CM | POA: Diagnosis not present

## 2014-07-06 ENCOUNTER — Encounter: Payer: Self-pay | Admitting: Nurse Practitioner

## 2014-07-06 ENCOUNTER — Non-Acute Institutional Stay (SKILLED_NURSING_FACILITY): Payer: Medicare Other | Admitting: Nurse Practitioner

## 2014-07-06 DIAGNOSIS — R609 Edema, unspecified: Secondary | ICD-10-CM

## 2014-07-06 DIAGNOSIS — F329 Major depressive disorder, single episode, unspecified: Secondary | ICD-10-CM | POA: Diagnosis not present

## 2014-07-06 DIAGNOSIS — M546 Pain in thoracic spine: Secondary | ICD-10-CM | POA: Diagnosis not present

## 2014-07-06 DIAGNOSIS — K5900 Constipation, unspecified: Secondary | ICD-10-CM

## 2014-07-06 DIAGNOSIS — I10 Essential (primary) hypertension: Secondary | ICD-10-CM

## 2014-07-06 DIAGNOSIS — M25529 Pain in unspecified elbow: Secondary | ICD-10-CM | POA: Diagnosis not present

## 2014-07-06 DIAGNOSIS — E039 Hypothyroidism, unspecified: Secondary | ICD-10-CM

## 2014-07-06 DIAGNOSIS — K219 Gastro-esophageal reflux disease without esophagitis: Secondary | ICD-10-CM

## 2014-07-06 DIAGNOSIS — G4700 Insomnia, unspecified: Secondary | ICD-10-CM

## 2014-07-06 DIAGNOSIS — M25519 Pain in unspecified shoulder: Secondary | ICD-10-CM | POA: Diagnosis not present

## 2014-07-06 DIAGNOSIS — M79621 Pain in right upper arm: Secondary | ICD-10-CM

## 2014-07-06 DIAGNOSIS — F32A Depression, unspecified: Secondary | ICD-10-CM

## 2014-07-06 DIAGNOSIS — M79622 Pain in left upper arm: Secondary | ICD-10-CM | POA: Insufficient documentation

## 2014-07-06 NOTE — Progress Notes (Signed)
Patient ID: Dana George, female   DOB: November 01, 1923, 81 y.o.   MRN: [id number]   Code Status: DNR  No Known Allergies  Chief Complaint  Patient presents with  . Medical Management of Chronic Issues    HPI: Patient is a 81 y.o. female seen in the SNF at [facility] today for evaluation of chronic medical conditions.  Problem List Items Addressed This Visit    Pain of right upper arm - Primary    Pain in the right shoulder to elbow with ROM/reaching out/straightening arm--no swelling, redness, warmth in the right shoulder, upper arm, or elbow. X-ray of the right shoulder, humerus, and elbow to evaluate further.       Insomnia    Prn Ambien is needed.         Hypothyroidism    Takes Levothyroxine 21mcg daily, TSH 2.076 11/22/12.01/15/14 TSH 2.231        HTN (hypertension)    Controlled, takes Clonidine 0.1mg  daily, Carvedilol  12.5mg  bid, Cardizem 120mg , Lisinopril 66m, and Furosemide 10mg  qod         GERD (gastroesophageal reflux disease)    Hx. PPI in the past. Omeprazole 20mg  daily. Observe.  --stable         Edema    Ankles/feet--takes Furosemide 10mg  qod. No significant weight change.      Depression    Has been on Zoloft 50mg  for over 16 years, she seems more depressed, no significant change since Zoloft 75mg . Observe the patient. 03/13/14 stable, continue Zoloft 75mg  daily.  --mood is stabilized        Constipation    Controlled. Continue MiraLax daily and prn.        Back pain, thoracic    0/16/15 T 10 Dr. Newman Pies: PCP managing pain.  01/12/14 schedule Norco q am and continue prn q6h -- pain is reasonably controlled.            Review of Systems:  Review of Systems  Constitutional: Negative for fever, chills and diaphoresis.  HENT: Positive for hearing loss. Negative for congestion, ear discharge, ear pain, nosebleeds, sore throat and tinnitus.   Eyes: Negative for photophobia, pain, discharge and redness.  Respiratory:  Negative for cough, shortness of breath, wheezing and stridor.   Cardiovascular: Positive for leg swelling. Negative for chest pain and palpitations.       Trace in ankles.   Gastrointestinal: Negative for nausea, vomiting, abdominal pain, diarrhea, constipation and blood in stool.       Lower abd incisional hernia.   Endocrine: Negative for polydipsia.  Genitourinary: Positive for frequency. Negative for dysuria, urgency, hematuria and flank pain.  Musculoskeletal: Positive for back pain and arthralgias. Negative for myalgias and neck pain.       Improved back pain. She is able to ambulates with walker on unit.  C/o pain in her right shoulder to elbow about 2 months when reaching out or straightening arm  Skin: Negative for rash.       Lower abd incisional hernia-reducible  Left upper bruise and pain  Allergic/Immunologic: Negative for environmental allergies.  Neurological: Negative for dizziness, tremors, seizures, weakness and headaches.  Hematological: Does not bruise/bleed easily.  Psychiatric/Behavioral: Negative for suicidal ideas and hallucinations. The patient is not nervous/anxious.      Past Medical History  Diagnosis Date  . Hypothyroidism   . Hypertension   . Shortness of breath   . COPD (chronic obstructive pulmonary disease)   . Osteoporosis   . Cancer  1980    Breast cancer  . TIA (transient ischemic attack)   . Incisional hernia, without obstruction or gangrene 05/27/2014    Lower abd from previous surgery-2-3 small incisional hernias which are reducible-no incarceration or pain complained.    . Insomnia 11/18/2012  . Thoracic spine fracture 12/18/2013    01/05/14 T 10 Dr. Newman Pies: PCP managing pain.  F/u 2 months(suggested that if the pain is "not too bad" and seems to be improving that she "live with it" and give a change to heal without intervention.  12/18/13 X-ray Thoracic spine: Compression deformity at the level of T10. Comparing back to a chest x-ray from  12/09/2013, this appears new in the interval.       . Back pain, thoracic 01/12/2014  . Left hemiparesis 06/08/2014  . CVA (cerebral infarction) 06/08/2011    Following abdominal surgery for bowel obstruction   . Granuloma annulare 06/08/2014  . Constipation 11/18/2012  . Depression 12/26/2013  . DVT (deep venous thrombosis) 11/18/2012  . Fall 02/07/2013  . GERD (gastroesophageal reflux disease) 03/30/2014  . HTN (hypertension) 11/18/2012    01/15/14 Bun/creat 14/0.60   . Ventral hernia 06/08/2014   Past Surgical History  Procedure Laterality Date  . Abdominal hysterectomy    . Total knee arthroplasty  2010    left knee  . Mastectomy  1980    left side  . Tonsillectomy    . Exploratory laparotomy w/ bowel resection      10/19/12-11/01/12 [facility] for exploratory laparotomty with extensive lysis of adhesion and segmental small bowel resection with end to end anastomosis per Dr. Isidore Moos 10/22/12  . Cataract extraction, bilateral     Social History:   reports that she quit smoking about 28 years ago. She does not have any smokeless tobacco history on file. She reports that she does not drink alcohol or use illicit drugs.  Family History  Problem Relation Age of Onset  . High blood pressure Mother   . Osteoporosis Mother   . Heart attack Father     d/o 5    Medications: Patient's Medications  New Prescriptions   No medications on file  Previous Medications   ACETAMINOPHEN (TYLENOL) 325 MG TABLET    Take 650 mg by mouth every 4 (four) hours as needed for moderate pain.   ASPIRIN 81 MG CHEWABLE TABLET    Chew 81 mg by mouth every morning.   CARVEDILOL (COREG) 12.5 MG TABLET    Take 12.5 mg by mouth 2 (two) times daily with a meal.   CLONIDINE (CATAPRES) 0.1 MG TABLET    Take 0.1 mg by mouth every morning.    DILTIAZEM (CARDIZEM CD) 120 MG 24 HR CAPSULE    Take 120 mg by mouth daily.    DOCUSATE SODIUM (COLACE) 100 MG CAPSULE    Take 1 capsule (100 mg total) by mouth 2 (two) times  daily.   FUROSEMIDE (LASIX) 20 MG TABLET    Take 10 mg by mouth every other day.    HYDROCODONE-ACETAMINOPHEN (NORCO/VICODIN) 5-325 MG PER TABLET    Take one tablet by mouth every 6 hours as needed for pain   LEVOTHYROXINE (SYNTHROID, LEVOTHROID) 50 MCG TABLET    Take 50 mcg by mouth every morning.    LISINOPRIL (PRINIVIL,ZESTRIL) 40 MG TABLET    Take 20 mg by mouth every morning.    MELATONIN 3 MG TBDP    Take 3 mg by mouth at bedtime.   MULTIPLE VITAMIN (MULTIVITAMIN WITH  MINERALS) TABS TABLET    Take 1 tablet by mouth every morning.   OMEPRAZOLE (PRILOSEC) 20 MG CAPSULE    Take 20 mg by mouth daily.   POLYETHYLENE GLYCOL (MIRALAX / GLYCOLAX) PACKET    Take 17 g by mouth daily as needed.    SERTRALINE (ZOLOFT) 50 MG TABLET    Take 50 mg by mouth at bedtime.   TIOTROPIUM (SPIRIVA) 18 MCG INHALATION CAPSULE    Place 18 mcg into inhaler and inhale daily.  Modified Medications   No medications on file  Discontinued Medications   No medications on file     Physical Exam: Physical Exam  Constitutional: She is oriented to person, place, and time. She appears well-developed and well-nourished. No distress.  HENT:  Head: Normocephalic and atraumatic.  Right Ear: External ear normal.  Left Ear: External ear normal.  Nose: Nose normal.  Mouth/Throat: Oropharynx is clear and moist. No oropharyngeal exudate.  Eyes: Conjunctivae and EOM are normal. Pupils are equal, round, and reactive to light. Right eye exhibits no discharge. Left eye exhibits no discharge. No scleral icterus.  Neck: Normal range of motion. Neck supple. No JVD present. No tracheal deviation present. No thyromegaly present.  Cardiovascular: Normal rate, regular rhythm, normal heart sounds and intact distal pulses.   No murmur heard. Pulmonary/Chest: Effort normal. No stridor. No respiratory distress. She has decreased breath sounds. She has no wheezes. She has rales. She exhibits no tenderness.  bibasilar  Abdominal: Soft. She  exhibits no distension and no mass. There is no tenderness. There is no rebound and no guarding.  Musculoskeletal: Normal range of motion. She exhibits edema and tenderness.  Ankles and feet have trace edema. Mid back pain.  Left shoulder to elbow pain with RUE movements-upper arm bruise and mild swelling and pain.   Lymphadenopathy:    She has no cervical adenopathy.  Neurological: She is alert and oriented to person, place, and time. No cranial nerve deficit. She exhibits normal muscle tone. Coordination normal.  Left hemiparesis  Skin: Skin is warm and dry. No rash noted. She is not diaphoretic. No erythema. No pallor.  Mid abd surgical scar-healed-lower abd reducible incisional hernia-the patient has concerns of cosmetic reason but denied pain.  Left mastectomy Scalp skin lesion above the left ear.   Psychiatric: She has a normal mood and affect. Her behavior is normal. Judgment and thought content normal.  Repetitive.    Filed Vitals:   07/06/14 1114  BP: 117/67  Pulse: 72  Temp: 97.4 F (36.3 C)  TempSrc: Tympanic  Resp: 20      Labs reviewed: Basic Metabolic Panel:  Recent Labs  12/09/13 0755 12/18/13 1621 12/19/13 0404 01/15/14  NA 141 144 141 139  K 3.9 3.5* 3.7 4.8  CL 101 100 102  --   CO2 27 26 27   --   GLUCOSE 117* 93 122*  --   BUN 20 13 21 14   CREATININE 0.64 0.56 0.74 0.6  CALCIUM 9.6 10.1 9.2  --   TSH  --   --   --  2.23   Liver Function Tests:  Recent Labs  12/18/13 1621 01/15/14  AST 20 18  ALT 16 17  ALKPHOS 102 89  BILITOT 0.5  --   PROT 7.5  --   ALBUMIN 4.4  --    No results for input(s): LIPASE, AMYLASE in the last 8760 hours. No results for input(s): AMMONIA in the last 8760 hours. CBC:  Recent Labs  12/09/13 0755 12/18/13 1621 12/19/13 0404 12/20/13 0402 01/15/14  WBC 9.1 6.5 8.3 6.1 5.3  NEUTROABS 7.1 4.6  --   --   --   HGB 15.9* 17.2* 15.5* 15.2* 14.9  HCT 47.8* 51.4* 47.6* 45.9 45  MCV 97.4 94.8 96.4 96.6  --   PLT  390 450* 427* 419* 440*   Lipid Panel: No results for input(s): CHOL, HDL, LDLCALC, TRIG, CHOLHDL, LDLDIRECT in the last 8760 hours.  Past Procedures:  12/18/13 CT head w/o CM:   IMPRESSION:  1. There is no acute intracranial hemorrhage nor other acute  abnormality of the brain. There are extensive changes of chronic  small vessel ischemia.  2. There is no acute skull fracture.   12/18/13 X-ray Chest, Lumbar spine, Thoracic spine:  IMPRESSION: T5 compression fracture stable since 12/10/2008.  T10 compression fracture appears new since 12/09/2013.  IMPRESSION: No evidence for lumbar spine fracture.  Compression deformity at the level of T10. Comparing back to a chest x-ray from 12/09/2013, this appears new in the interval.  IMPRESSION: Collapse of the T3 and T8 vertebral bodies which may be recent. Old healed fracture of left lateral seventh rib. Lungs clear. No pneumothorax.   Assessment/Plan Pain of right upper arm Pain in the right shoulder to elbow with ROM/reaching out/straightening arm--no swelling, redness, warmth in the right shoulder, upper arm, or elbow. X-ray of the right shoulder, humerus, and elbow to evaluate further.    Insomnia Prn Ambien is needed.      Hypothyroidism Takes Levothyroxine 93mcg daily, TSH 2.076 11/22/12.01/15/14 TSH 2.231     HTN (hypertension) Controlled, takes Clonidine 0.1mg  daily, Carvedilol  12.5mg  bid, Cardizem 120mg , Lisinopril 55m, and Furosemide 10mg  qod      GERD (gastroesophageal reflux disease) Hx. PPI in the past. Omeprazole 20mg  daily. Observe.  --stable      Edema Ankles/feet--takes Furosemide 10mg  qod. No significant weight change.   Depression Has been on Zoloft 50mg  for over 16 years, she seems more depressed, no significant change since Zoloft 75mg . Observe the patient. 03/13/14 stable, continue Zoloft 75mg  daily.  --mood is stabilized     Constipation Controlled. Continue MiraLax  daily and prn.     Back pain, thoracic 0/16/15 T 10 Dr. Newman Pies: PCP managing pain.  01/12/14 schedule Norco q am and continue prn q6h -- pain is reasonably controlled.       Family/ Staff Communication: observe the patient.   Goals of Care: SNF  Labs/tests ordered: X-ray L shoulder, humerus, and elbow.

## 2014-07-06 NOTE — Assessment & Plan Note (Signed)
Has been on Zoloft 50mg  for over 16 years, she seems more depressed, no significant change since Zoloft 75mg . Observe the patient. 03/13/14 stable, continue Zoloft 75mg  daily.  --mood is stabilized

## 2014-07-06 NOTE — Assessment & Plan Note (Signed)
0/16/15 T 10 Dr. Newman Pies: PCP managing pain.  01/12/14 schedule Norco q am and continue prn q6h -- pain is reasonably controlled.

## 2014-07-06 NOTE — Assessment & Plan Note (Signed)
Pain in the right shoulder to elbow with ROM/reaching out/straightening arm--no swelling, redness, warmth in the right shoulder, upper arm, or elbow. X-ray of the right shoulder, humerus, and elbow to evaluate further.

## 2014-07-06 NOTE — Assessment & Plan Note (Signed)
Controlled. Continue MiraLax daily and prn.

## 2014-07-06 NOTE — Assessment & Plan Note (Signed)
Hx. PPI in the past. Omeprazole 20mg  daily. Observe.  --stable

## 2014-07-06 NOTE — Assessment & Plan Note (Signed)
Takes Levothyroxine 58mcg daily, TSH 2.076 11/22/12.01/15/14 TSH 2.231

## 2014-07-06 NOTE — Assessment & Plan Note (Signed)
Prn Ambien is needed.

## 2014-07-06 NOTE — Assessment & Plan Note (Signed)
Ankles/feet--takes Furosemide 10mg  qod. No significant weight change.

## 2014-07-06 NOTE — Assessment & Plan Note (Signed)
Controlled, takes Clonidine 0.1mg  daily, Carvedilol  12.5mg  bid, Cardizem 120mg , Lisinopril 2m, and Furosemide 10mg  qod

## 2014-07-09 DIAGNOSIS — J449 Chronic obstructive pulmonary disease, unspecified: Secondary | ICD-10-CM | POA: Diagnosis not present

## 2014-07-09 DIAGNOSIS — G4700 Insomnia, unspecified: Secondary | ICD-10-CM | POA: Diagnosis not present

## 2014-07-09 DIAGNOSIS — E870 Hyperosmolality and hypernatremia: Secondary | ICD-10-CM | POA: Diagnosis not present

## 2014-07-09 DIAGNOSIS — K5660 Unspecified intestinal obstruction: Secondary | ICD-10-CM | POA: Diagnosis not present

## 2014-07-09 DIAGNOSIS — I6350 Cerebral infarction due to unspecified occlusion or stenosis of unspecified cerebral artery: Secondary | ICD-10-CM | POA: Diagnosis not present

## 2014-07-09 DIAGNOSIS — M6281 Muscle weakness (generalized): Secondary | ICD-10-CM | POA: Diagnosis not present

## 2014-07-10 ENCOUNTER — Other Ambulatory Visit: Payer: Self-pay | Admitting: Nurse Practitioner

## 2014-07-10 DIAGNOSIS — M546 Pain in thoracic spine: Secondary | ICD-10-CM

## 2014-07-12 DIAGNOSIS — K5660 Unspecified intestinal obstruction: Secondary | ICD-10-CM | POA: Diagnosis not present

## 2014-07-12 DIAGNOSIS — J449 Chronic obstructive pulmonary disease, unspecified: Secondary | ICD-10-CM | POA: Diagnosis not present

## 2014-07-12 DIAGNOSIS — M6281 Muscle weakness (generalized): Secondary | ICD-10-CM | POA: Diagnosis not present

## 2014-07-12 DIAGNOSIS — G4700 Insomnia, unspecified: Secondary | ICD-10-CM | POA: Diagnosis not present

## 2014-07-12 DIAGNOSIS — I6350 Cerebral infarction due to unspecified occlusion or stenosis of unspecified cerebral artery: Secondary | ICD-10-CM | POA: Diagnosis not present

## 2014-07-12 DIAGNOSIS — E870 Hyperosmolality and hypernatremia: Secondary | ICD-10-CM | POA: Diagnosis not present

## 2014-07-16 DIAGNOSIS — E870 Hyperosmolality and hypernatremia: Secondary | ICD-10-CM | POA: Diagnosis not present

## 2014-07-16 DIAGNOSIS — I6350 Cerebral infarction due to unspecified occlusion or stenosis of unspecified cerebral artery: Secondary | ICD-10-CM | POA: Diagnosis not present

## 2014-07-16 DIAGNOSIS — J449 Chronic obstructive pulmonary disease, unspecified: Secondary | ICD-10-CM | POA: Diagnosis not present

## 2014-07-16 DIAGNOSIS — K5660 Unspecified intestinal obstruction: Secondary | ICD-10-CM | POA: Diagnosis not present

## 2014-07-16 DIAGNOSIS — M6281 Muscle weakness (generalized): Secondary | ICD-10-CM | POA: Diagnosis not present

## 2014-07-16 DIAGNOSIS — G4700 Insomnia, unspecified: Secondary | ICD-10-CM | POA: Diagnosis not present

## 2014-07-17 ENCOUNTER — Encounter: Payer: Self-pay | Admitting: Nurse Practitioner

## 2014-07-17 ENCOUNTER — Non-Acute Institutional Stay (SKILLED_NURSING_FACILITY): Payer: Medicare Other | Admitting: Nurse Practitioner

## 2014-07-17 DIAGNOSIS — K5900 Constipation, unspecified: Secondary | ICD-10-CM | POA: Diagnosis not present

## 2014-07-17 DIAGNOSIS — M25519 Pain in unspecified shoulder: Secondary | ICD-10-CM | POA: Diagnosis not present

## 2014-07-17 DIAGNOSIS — F329 Major depressive disorder, single episode, unspecified: Secondary | ICD-10-CM

## 2014-07-17 DIAGNOSIS — R609 Edema, unspecified: Secondary | ICD-10-CM | POA: Diagnosis not present

## 2014-07-17 DIAGNOSIS — K219 Gastro-esophageal reflux disease without esophagitis: Secondary | ICD-10-CM

## 2014-07-17 DIAGNOSIS — E039 Hypothyroidism, unspecified: Secondary | ICD-10-CM | POA: Diagnosis not present

## 2014-07-17 DIAGNOSIS — G4700 Insomnia, unspecified: Secondary | ICD-10-CM | POA: Diagnosis not present

## 2014-07-17 DIAGNOSIS — M79622 Pain in left upper arm: Secondary | ICD-10-CM

## 2014-07-17 DIAGNOSIS — I10 Essential (primary) hypertension: Secondary | ICD-10-CM | POA: Diagnosis not present

## 2014-07-17 DIAGNOSIS — M25529 Pain in unspecified elbow: Secondary | ICD-10-CM | POA: Diagnosis not present

## 2014-07-17 DIAGNOSIS — R5383 Other fatigue: Secondary | ICD-10-CM

## 2014-07-17 DIAGNOSIS — F32A Depression, unspecified: Secondary | ICD-10-CM

## 2014-07-17 NOTE — Assessment & Plan Note (Signed)
Pain in the left shoulder to elbow with ROM/reaching out/straightening arm--no swelling, redness, warmth in the right shoulder, upper arm, or elbow. X-ray of the right shoulder, humerus, and elbow to evaluate further.  Repeat X-ray for persisted pain.

## 2014-07-17 NOTE — Assessment & Plan Note (Signed)
Controlled, takes Clonidine 0.1mg  daily, Carvedilol  12.5mg  bid, Cardizem 120mg , Lisinopril 50m, and Furosemide 10mg  qod. Update CMP

## 2014-07-17 NOTE — Assessment & Plan Note (Signed)
Has been on Zoloft 50mg  for over 16 years, she seems more depressed, no significant change since Zoloft 75mg . Observe the patient. 03/13/14 stable, continue Zoloft 75mg  daily.  --mood is stabilized

## 2014-07-17 NOTE — Assessment & Plan Note (Signed)
Hx. PPI in the past. Omeprazole 20mg  daily. Observe.  --stable

## 2014-07-17 NOTE — Assessment & Plan Note (Signed)
Controlled. Continue MiraLax daily and prn.

## 2014-07-17 NOTE — Assessment & Plan Note (Signed)
W/o new focal neurological symptoms. Will update CBC, CMP, UA C/S and TSH.

## 2014-07-17 NOTE — Progress Notes (Signed)
Patient ID: Dana George, female   DOB: Jan 13, 1924, 81 y.o.   MRN: [id number]   Code Status: DNR  No Known Allergies  Chief Complaint  Patient presents with  . Medical Management of Chronic Issues  . Acute Visit    lethargy, left arm pain.    HPI: Patient is a 81 y.o. female seen in the SNF at [facility] today for evaluation of persisted left arm pain and chronic medical conditions.  Problem List Items Addressed This Visit    HTN (hypertension)    Controlled, takes Clonidine 0.1mg  daily, Carvedilol  12.5mg  bid, Cardizem 120mg , Lisinopril 82m, and Furosemide 10mg  qod. Update CMP       Insomnia    Prn Ambien is needed.       Constipation    Controlled. Continue MiraLax daily and prn.         Hypothyroidism    Takes Levothyroxine 33mcg daily, TSH 2.076 11/22/12.01/15/14 TSH 2.231. F/u TSH         Edema    Ankles/feet--takes Furosemide 10mg  qod. No significant weight change.      Depression    Has been on Zoloft 50mg  for over 16 years, she seems more depressed, no significant change since Zoloft 75mg . Observe the patient. 03/13/14 stable, continue Zoloft 75mg  daily.  --mood is stabilized      GERD (gastroesophageal reflux disease)    Hx. PPI in the past. Omeprazole 20mg  daily. Observe.  --stable       Pain of left upper arm    Pain in the left shoulder to elbow with ROM/reaching out/straightening arm--no swelling, redness, warmth in the right shoulder, upper arm, or elbow. X-ray of the right shoulder, humerus, and elbow to evaluate further.  Repeat X-ray for persisted pain.        Lethargy - Primary    W/o new focal neurological symptoms. Will update CBC, CMP, UA C/S and TSH.          Review of Systems:  Review of Systems  Constitutional: Positive for fatigue. Negative for fever, chills and diaphoresis.  HENT: Positive for hearing loss. Negative for congestion, ear discharge, ear pain, nosebleeds, sore throat and tinnitus.   Eyes: Negative for  photophobia, pain, discharge and redness.  Respiratory: Negative for cough, shortness of breath, wheezing and stridor.   Cardiovascular: Positive for leg swelling. Negative for chest pain and palpitations.       Trace in ankles.   Gastrointestinal: Negative for nausea, vomiting, abdominal pain, diarrhea, constipation and blood in stool.       Lower abd incisional hernia.   Endocrine: Negative for polydipsia.  Genitourinary: Positive for frequency. Negative for dysuria, urgency, hematuria and flank pain.  Musculoskeletal: Positive for back pain and arthralgias. Negative for myalgias and neck pain.       Improved back pain. She is able to ambulates with walker on unit.  C/o pain in her left arm  Skin: Negative for rash.       Lower abd incisional hernia-reducible  Left upper arm and forearm ecchymoses  Allergic/Immunologic: Negative for environmental allergies.  Neurological: Negative for dizziness, tremors, seizures, weakness and headaches.  Hematological: Does not bruise/bleed easily.  Psychiatric/Behavioral: Negative for suicidal ideas and hallucinations. The patient is not nervous/anxious.      Past Medical History  Diagnosis Date  . Hypothyroidism   . Hypertension   . Shortness of breath   . COPD (chronic obstructive pulmonary disease)   . Osteoporosis   . Cancer 1980  Breast cancer  . TIA (transient ischemic attack)   . Incisional hernia, without obstruction or gangrene 05/27/2014    Lower abd from previous surgery-2-3 small incisional hernias which are reducible-no incarceration or pain complained.    . Insomnia 11/18/2012  . Thoracic spine fracture 12/18/2013    01/05/14 T 10 Dr. Newman Pies: PCP managing pain.  F/u 2 months(suggested that if the pain is "not too bad" and seems to be improving that she "live with it" and give a change to heal without intervention.  12/18/13 X-ray Thoracic spine: Compression deformity at the level of T10. Comparing back to a chest x-ray from  12/09/2013, this appears new in the interval.       . Back pain, thoracic 01/12/2014  . Left hemiparesis 06/08/2014  . CVA (cerebral infarction) 06/08/2011    Following abdominal surgery for bowel obstruction   . Granuloma annulare 06/08/2014  . Constipation 11/18/2012  . Depression 12/26/2013  . DVT (deep venous thrombosis) 11/18/2012  . Fall 02/07/2013  . GERD (gastroesophageal reflux disease) 03/30/2014  . HTN (hypertension) 11/18/2012    01/15/14 Bun/creat 14/0.60   . Ventral hernia 06/08/2014   Past Surgical History  Procedure Laterality Date  . Abdominal hysterectomy    . Total knee arthroplasty  2010    left knee  . Mastectomy  1980    left side  . Tonsillectomy    . Exploratory laparotomy w/ bowel resection      10/19/12-11/01/12 [facility] for exploratory laparotomty with extensive lysis of adhesion and segmental small bowel resection with end to end anastomosis per Dr. Isidore Moos 10/22/12  . Cataract extraction, bilateral     Social History:   reports that she quit smoking about 28 years ago. She does not have any smokeless tobacco history on file. She reports that she does not drink alcohol or use illicit drugs.  Family History  Problem Relation Age of Onset  . High blood pressure Mother   . Osteoporosis Mother   . Heart attack Father     d/o 43    Medications: Patient's Medications  New Prescriptions   No medications on file  Previous Medications   ACETAMINOPHEN (TYLENOL) 325 MG TABLET    Take 650 mg by mouth every 4 (four) hours as needed for moderate pain.   ASPIRIN 81 MG CHEWABLE TABLET    Chew 81 mg by mouth every morning.   CARVEDILOL (COREG) 12.5 MG TABLET    Take 12.5 mg by mouth 2 (two) times daily with a meal.   CLONIDINE (CATAPRES) 0.1 MG TABLET    Take 0.1 mg by mouth every morning.    DILTIAZEM (CARDIZEM CD) 120 MG 24 HR CAPSULE    Take 120 mg by mouth daily.    DOCUSATE SODIUM (COLACE) 100 MG CAPSULE    Take 1 capsule (100 mg total) by mouth 2 (two) times  daily.   FUROSEMIDE (LASIX) 20 MG TABLET    Take 10 mg by mouth every other day.    LEVOTHYROXINE (SYNTHROID, LEVOTHROID) 50 MCG TABLET    Take 50 mcg by mouth every morning.    LISINOPRIL (PRINIVIL,ZESTRIL) 40 MG TABLET    Take 20 mg by mouth every morning.    MELATONIN 3 MG TBDP    Take 3 mg by mouth at bedtime.   MULTIPLE VITAMIN (MULTIVITAMIN WITH MINERALS) TABS TABLET    Take 1 tablet by mouth every morning.   OMEPRAZOLE (PRILOSEC) 20 MG CAPSULE    Take 20 mg by  mouth daily.   POLYETHYLENE GLYCOL (MIRALAX / GLYCOLAX) PACKET    Take 17 g by mouth daily as needed.    SERTRALINE (ZOLOFT) 50 MG TABLET    Take 50 mg by mouth at bedtime.   TIOTROPIUM (SPIRIVA) 18 MCG INHALATION CAPSULE    Place 18 mcg into inhaler and inhale daily.  Modified Medications   No medications on file  Discontinued Medications   No medications on file     Physical Exam: Physical Exam  Constitutional: She is oriented to person, place, and time. She appears well-developed and well-nourished. No distress.  HENT:  Head: Normocephalic and atraumatic.  Right Ear: External ear normal.  Left Ear: External ear normal.  Nose: Nose normal.  Mouth/Throat: Oropharynx is clear and moist. No oropharyngeal exudate.  Eyes: Conjunctivae and EOM are normal. Pupils are equal, round, and reactive to light. Right eye exhibits no discharge. Left eye exhibits no discharge. No scleral icterus.  Neck: Normal range of motion. Neck supple. No JVD present. No tracheal deviation present. No thyromegaly present.  Cardiovascular: Normal rate, regular rhythm, normal heart sounds and intact distal pulses.   No murmur heard. Pulmonary/Chest: Effort normal. No stridor. No respiratory distress. She has decreased breath sounds. She has no wheezes. She has rales. She exhibits no tenderness.  bibasilar  Abdominal: Soft. She exhibits no distension and no mass. There is no tenderness. There is no rebound and no guarding.  Musculoskeletal: Normal  range of motion. She exhibits edema and tenderness.  Ankles and feet have trace edema. Mid back pain.  Left shoulder to elbow pain with movements-upper arm and forearm ecchymoses and mild swelling.   Lymphadenopathy:    She has no cervical adenopathy.  Neurological: She is alert and oriented to person, place, and time. No cranial nerve deficit. She exhibits normal muscle tone. Coordination normal.  Left hemiparesis  Skin: Skin is warm and dry. No rash noted. She is not diaphoretic. No erythema. No pallor.  Mid abd surgical scar-healed-lower abd reducible incisional hernia-the patient has concerns of cosmetic reason but denied pain.  Left mastectomy Scalp skin lesion above the left ear.   Psychiatric: She has a normal mood and affect. Her behavior is normal. Judgment and thought content normal.  Repetitive.    Filed Vitals:   07/17/14 1225  BP: 110/60  Pulse: 76  Temp: 97.8 F (36.6 C)  TempSrc: Tympanic  Resp: 20      Labs reviewed: Basic Metabolic Panel:  Recent Labs  12/09/13 0755 12/18/13 1621 12/19/13 0404 01/15/14  NA 141 144 141 139  K 3.9 3.5* 3.7 4.8  CL 101 100 102  --   CO2 27 26 27   --   GLUCOSE 117* 93 122*  --   BUN 20 13 21 14   CREATININE 0.64 0.56 0.74 0.6  CALCIUM 9.6 10.1 9.2  --   TSH  --   --   --  2.23   Liver Function Tests:  Recent Labs  12/18/13 1621 01/15/14  AST 20 18  ALT 16 17  ALKPHOS 102 89  BILITOT 0.5  --   PROT 7.5  --   ALBUMIN 4.4  --    No results for input(s): LIPASE, AMYLASE in the last 8760 hours. No results for input(s): AMMONIA in the last 8760 hours. CBC:  Recent Labs  12/09/13 0755 12/18/13 1621 12/19/13 0404 12/20/13 0402 01/15/14  WBC 9.1 6.5 8.3 6.1 5.3  NEUTROABS 7.1 4.6  --   --   --  HGB 15.9* 17.2* 15.5* 15.2* 14.9  HCT 47.8* 51.4* 47.6* 45.9 45  MCV 97.4 94.8 96.4 96.6  --   PLT 390 450* 427* 419* 440*   Lipid Panel: No results for input(s): CHOL, HDL, LDLCALC, TRIG, CHOLHDL, LDLDIRECT in the  last 8760 hours.  Past Procedures:  12/18/13 CT head w/o CM:   IMPRESSION:  1. There is no acute intracranial hemorrhage nor other acute  abnormality of the brain. There are extensive changes of chronic  small vessel ischemia.  2. There is no acute skull fracture.   12/18/13 X-ray Chest, Lumbar spine, Thoracic spine:  IMPRESSION: T5 compression fracture stable since 12/10/2008.  T10 compression fracture appears new since 12/09/2013.  IMPRESSION: No evidence for lumbar spine fracture.  Compression deformity at the level of T10. Comparing back to a chest x-ray from 12/09/2013, this appears new in the interval.  IMPRESSION: Collapse of the T3 and T8 vertebral bodies which may be recent. Old healed fracture of left lateral seventh rib. Lungs clear. No pneumothorax.   Assessment/Plan HTN (hypertension) Controlled, takes Clonidine 0.1mg  daily, Carvedilol  12.5mg  bid, Cardizem 120mg , Lisinopril 44m, and Furosemide 10mg  qod. Update CMP    Insomnia Prn Ambien is needed.    Constipation Controlled. Continue MiraLax daily and prn.      Hypothyroidism Takes Levothyroxine 66mcg daily, TSH 2.076 11/22/12.01/15/14 TSH 2.231. F/u TSH      Edema Ankles/feet--takes Furosemide 10mg  qod. No significant weight change.   Depression Has been on Zoloft 50mg  for over 16 years, she seems more depressed, no significant change since Zoloft 75mg . Observe the patient. 03/13/14 stable, continue Zoloft 75mg  daily.  --mood is stabilized   GERD (gastroesophageal reflux disease) Hx. PPI in the past. Omeprazole 20mg  daily. Observe.  --stable    Pain of left upper arm Pain in the left shoulder to elbow with ROM/reaching out/straightening arm--no swelling, redness, warmth in the right shoulder, upper arm, or elbow. X-ray of the right shoulder, humerus, and elbow to evaluate further.  Repeat X-ray for persisted pain.     Lethargy W/o new focal neurological symptoms. Will  update CBC, CMP, UA C/S and TSH.     Family/ Staff Communication: observe the patient.   Goals of Care: SNF  Labs/tests ordered: repeat X-ray L shoulder, humerus, and elbow. CBC, CMP, TSH

## 2014-07-17 NOTE — Assessment & Plan Note (Signed)
Takes Levothyroxine 42mcg daily, TSH 2.076 11/22/12.01/15/14 TSH 2.231. F/u TSH

## 2014-07-17 NOTE — Assessment & Plan Note (Signed)
Prn Ambien is needed.

## 2014-07-17 NOTE — Assessment & Plan Note (Signed)
Ankles/feet--takes Furosemide 10mg  qod. No significant weight change.

## 2014-07-18 DIAGNOSIS — R4182 Altered mental status, unspecified: Secondary | ICD-10-CM | POA: Diagnosis not present

## 2014-07-18 DIAGNOSIS — R635 Abnormal weight gain: Secondary | ICD-10-CM | POA: Diagnosis not present

## 2014-07-18 DIAGNOSIS — J449 Chronic obstructive pulmonary disease, unspecified: Secondary | ICD-10-CM | POA: Diagnosis not present

## 2014-07-18 DIAGNOSIS — G4700 Insomnia, unspecified: Secondary | ICD-10-CM | POA: Diagnosis not present

## 2014-07-18 DIAGNOSIS — E870 Hyperosmolality and hypernatremia: Secondary | ICD-10-CM | POA: Diagnosis not present

## 2014-07-18 DIAGNOSIS — K5660 Unspecified intestinal obstruction: Secondary | ICD-10-CM | POA: Diagnosis not present

## 2014-07-18 DIAGNOSIS — E1059 Type 1 diabetes mellitus with other circulatory complications: Secondary | ICD-10-CM | POA: Diagnosis not present

## 2014-07-18 DIAGNOSIS — N390 Urinary tract infection, site not specified: Secondary | ICD-10-CM | POA: Diagnosis not present

## 2014-07-18 DIAGNOSIS — I6350 Cerebral infarction due to unspecified occlusion or stenosis of unspecified cerebral artery: Secondary | ICD-10-CM | POA: Diagnosis not present

## 2014-07-18 DIAGNOSIS — M6281 Muscle weakness (generalized): Secondary | ICD-10-CM | POA: Diagnosis not present

## 2014-07-18 LAB — BASIC METABOLIC PANEL
BUN: 30 mg/dL — AB (ref 4–21)
CREATININE: 0.6 mg/dL (ref 0.5–1.1)
Glucose: 86 mg/dL
Potassium: 4.4 mmol/L (ref 3.4–5.3)
Sodium: 140 mmol/L (ref 137–147)

## 2014-07-18 LAB — HEPATIC FUNCTION PANEL
ALK PHOS: 84 U/L (ref 25–125)
ALT: 15 U/L (ref 7–35)
AST: 20 U/L (ref 13–35)
Bilirubin, Total: 0.6 mg/dL

## 2014-07-18 LAB — CBC AND DIFFERENTIAL
HEMATOCRIT: 33 % — AB (ref 36–46)
HEMOGLOBIN: 10.9 g/dL — AB (ref 12.0–16.0)
PLATELETS: 98 10*3/uL — AB (ref 150–399)
WBC: 30.5 10^3/mL

## 2014-07-18 LAB — TSH: TSH: 2.88 u[IU]/mL (ref 0.41–5.90)

## 2014-07-20 ENCOUNTER — Encounter: Payer: Self-pay | Admitting: Nurse Practitioner

## 2014-07-20 ENCOUNTER — Non-Acute Institutional Stay (SKILLED_NURSING_FACILITY): Payer: Medicare Other | Admitting: Nurse Practitioner

## 2014-07-20 DIAGNOSIS — M546 Pain in thoracic spine: Secondary | ICD-10-CM

## 2014-07-20 DIAGNOSIS — K5900 Constipation, unspecified: Secondary | ICD-10-CM | POA: Diagnosis not present

## 2014-07-20 DIAGNOSIS — D72829 Elevated white blood cell count, unspecified: Secondary | ICD-10-CM

## 2014-07-20 DIAGNOSIS — R5383 Other fatigue: Secondary | ICD-10-CM

## 2014-07-20 DIAGNOSIS — R609 Edema, unspecified: Secondary | ICD-10-CM | POA: Diagnosis not present

## 2014-07-20 DIAGNOSIS — E039 Hypothyroidism, unspecified: Secondary | ICD-10-CM | POA: Diagnosis not present

## 2014-07-20 DIAGNOSIS — F329 Major depressive disorder, single episode, unspecified: Secondary | ICD-10-CM

## 2014-07-20 DIAGNOSIS — M79622 Pain in left upper arm: Secondary | ICD-10-CM

## 2014-07-20 DIAGNOSIS — S42402D Unspecified fracture of lower end of left humerus, subsequent encounter for fracture with routine healing: Secondary | ICD-10-CM | POA: Diagnosis not present

## 2014-07-20 DIAGNOSIS — I10 Essential (primary) hypertension: Secondary | ICD-10-CM | POA: Diagnosis not present

## 2014-07-20 DIAGNOSIS — F32A Depression, unspecified: Secondary | ICD-10-CM

## 2014-07-20 DIAGNOSIS — G4700 Insomnia, unspecified: Secondary | ICD-10-CM | POA: Diagnosis not present

## 2014-07-20 DIAGNOSIS — S40022A Contusion of left upper arm, initial encounter: Secondary | ICD-10-CM | POA: Diagnosis not present

## 2014-07-20 DIAGNOSIS — S5002XA Contusion of left elbow, initial encounter: Secondary | ICD-10-CM | POA: Insufficient documentation

## 2014-07-20 DIAGNOSIS — K219 Gastro-esophageal reflux disease without esophagitis: Secondary | ICD-10-CM

## 2014-07-20 NOTE — Assessment & Plan Note (Signed)
Has been on Zoloft 50mg  for over 16 years, she seems more depressed, no significant change since Zoloft 75mg . Observe the patient. 03/13/14 stable, continue Zoloft 75mg  daily.  --mood is stabilized

## 2014-07-20 NOTE — Assessment & Plan Note (Signed)
Takes Levothyroxine 37mcg daily, TSH 2.88 07/18/14

## 2014-07-20 NOTE — Assessment & Plan Note (Signed)
0/16/15 T 10 Dr. Newman Pies: PCP managing pain.  01/12/14 schedule Norco q am and continue prn q6h -- pain is reasonably controlled.

## 2014-07-20 NOTE — Assessment & Plan Note (Signed)
Hx. PPI in the past. Omeprazole 20mg  daily. Observe.  --stable

## 2014-07-20 NOTE — Assessment & Plan Note (Signed)
07/18/14 X-ray left elbow, humerus, and shoulder: question of distal humeral fracture. Positional pain noted, no focal weakness, resolving swelling and ecchymoses. Ortho consult pending.

## 2014-07-20 NOTE — Assessment & Plan Note (Signed)
07/17/14 X-ray question of distal humeral fracture-Ortho consult-ROM as tolerated.

## 2014-07-20 NOTE — Assessment & Plan Note (Signed)
Controlled, takes Clonidine 0.1mg  daily, Carvedilol  12.5mg  bid, Cardizem 120mg , Lisinopril 4m, and Furosemide 10mg  qod.

## 2014-07-20 NOTE — Assessment & Plan Note (Signed)
Controlled. Continue MiraLax daily and prn.

## 2014-07-20 NOTE — Assessment & Plan Note (Signed)
Returned to her baseline.

## 2014-07-20 NOTE — Assessment & Plan Note (Signed)
Prn Ambien is needed.

## 2014-07-20 NOTE — Assessment & Plan Note (Signed)
Ankles/feet--takes Furosemide 10mg  qod. No significant weight change.

## 2014-07-20 NOTE — Progress Notes (Signed)
Patient ID: Dana George, female   DOB: 03/17/1924, 81 y.o.   MRN: [id number]   Code Status: DNR  No Known Allergies  Chief Complaint  Patient presents with  . Medical Management of Chronic Issues  . Acute Visit    ? left distal humeral fracture.     HPI: Patient is a 81 y.o. female seen in the SNF at [facility] today for evaluation of ? Distal left humeral fx, elevated white count,  and chronic medical conditions.  Problem List Items Addressed This Visit    HTN (hypertension)    Controlled, takes Clonidine 0.1mg  daily, Carvedilol  12.5mg  bid, Cardizem 120mg , Lisinopril 102m, and Furosemide 10mg  qod.         Insomnia    Prn Ambien is needed.        Constipation    Controlled. Continue MiraLax daily and prn.        Hypothyroidism    Takes Levothyroxine 82mcg daily, TSH 2.88 07/18/14        Edema    Ankles/feet--takes Furosemide 10mg  qod. No significant weight change.       Depression    Has been on Zoloft 50mg  for over 16 years, she seems more depressed, no significant change since Zoloft 75mg . Observe the patient. 03/13/14 stable, continue Zoloft 75mg  daily.  --mood is stabilized      Back pain, thoracic    0/16/15 T 10 Dr. Newman Pies: PCP managing pain.  01/12/14 schedule Norco q am and continue prn q6h -- pain is reasonably controlled.         GERD (gastroesophageal reflux disease)    Hx. PPI in the past. Omeprazole 20mg  daily. Observe.  --stable       Pain of left upper arm    07/18/14 X-ray left elbow, humerus, and shoulder: question of distal humeral fracture. Positional pain noted, no focal weakness, resolving swelling and ecchymoses. Ortho consult pending.        Lethargy    Returned to her baseline.       Closed fracture of distal end of left humerus with routine healing - Primary    07/17/14 X-ray question of distal humeral fracture-Ortho consult-ROM as tolerated.        Leukocytosis      Review of Systems:  Review  of Systems  Constitutional: Positive for fatigue. Negative for fever, chills and diaphoresis.  HENT: Positive for hearing loss. Negative for congestion, ear discharge, ear pain, nosebleeds, sore throat and tinnitus.   Eyes: Negative for photophobia, pain, discharge and redness.  Respiratory: Negative for cough, shortness of breath, wheezing and stridor.   Cardiovascular: Positive for leg swelling. Negative for chest pain and palpitations.       Trace in ankles.   Gastrointestinal: Negative for nausea, vomiting, abdominal pain, diarrhea, constipation and blood in stool.       Lower abd incisional hernia.   Endocrine: Negative for polydipsia.  Genitourinary: Positive for frequency. Negative for dysuria, urgency, hematuria and flank pain.  Musculoskeletal: Positive for back pain and arthralgias. Negative for myalgias and neck pain.       Improved back pain. She is able to ambulates with walker on unit.  C/o pain in her left arm  Skin: Negative for rash.       Lower abd incisional hernia-reducible  Left upper arm and forearm ecchymoses  Allergic/Immunologic: Negative for environmental allergies.  Neurological: Negative for dizziness, tremors, seizures, weakness and headaches.  Hematological: Does not bruise/bleed easily.  Psychiatric/Behavioral:  Negative for suicidal ideas and hallucinations. The patient is not nervous/anxious.      Past Medical History  Diagnosis Date  . Hypothyroidism   . Hypertension   . Shortness of breath   . COPD (chronic obstructive pulmonary disease)   . Osteoporosis   . Cancer 1980    Breast cancer  . TIA (transient ischemic attack)   . Incisional hernia, without obstruction or gangrene 05/27/2014    Lower abd from previous surgery-2-3 small incisional hernias which are reducible-no incarceration or pain complained.    . Insomnia 11/18/2012  . Thoracic spine fracture 12/18/2013    01/05/14 T 10 Dr. Newman Pies: PCP managing pain.  F/u 2 months(suggested  that if the pain is "not too bad" and seems to be improving that she "live with it" and give a change to heal without intervention.  12/18/13 X-ray Thoracic spine: Compression deformity at the level of T10. Comparing back to a chest x-ray from 12/09/2013, this appears new in the interval.       . Back pain, thoracic 01/12/2014  . Left hemiparesis 06/08/2014  . CVA (cerebral infarction) 06/08/2011    Following abdominal surgery for bowel obstruction   . Granuloma annulare 06/08/2014  . Constipation 11/18/2012  . Depression 12/26/2013  . DVT (deep venous thrombosis) 11/18/2012  . Fall 02/07/2013  . GERD (gastroesophageal reflux disease) 03/30/2014  . HTN (hypertension) 11/18/2012    01/15/14 Bun/creat 14/0.60   . Ventral hernia 06/08/2014   Past Surgical History  Procedure Laterality Date  . Abdominal hysterectomy    . Total knee arthroplasty  2010    left knee  . Mastectomy  1980    left side  . Tonsillectomy    . Exploratory laparotomy w/ bowel resection      10/19/12-11/01/12 [facility] for exploratory laparotomty with extensive lysis of adhesion and segmental small bowel resection with end to end anastomosis per Dr. Isidore Moos 10/22/12  . Cataract extraction, bilateral     Social History:   reports that she quit smoking about 28 years ago. She does not have any smokeless tobacco history on file. She reports that she does not drink alcohol or use illicit drugs.  Family History  Problem Relation Age of Onset  . High blood pressure Mother   . Osteoporosis Mother   . Heart attack Father     d/o 22    Medications: Patient's Medications  New Prescriptions   No medications on file  Previous Medications   ACETAMINOPHEN (TYLENOL) 325 MG TABLET    Take 650 mg by mouth every 4 (four) hours as needed for moderate pain.   ASPIRIN 81 MG CHEWABLE TABLET    Chew 81 mg by mouth every morning.   CARVEDILOL (COREG) 12.5 MG TABLET    Take 12.5 mg by mouth 2 (two) times daily with a meal.   CLONIDINE  (CATAPRES) 0.1 MG TABLET    Take 0.1 mg by mouth every morning.    DILTIAZEM (CARDIZEM CD) 120 MG 24 HR CAPSULE    Take 120 mg by mouth daily.    DOCUSATE SODIUM (COLACE) 100 MG CAPSULE    Take 1 capsule (100 mg total) by mouth 2 (two) times daily.   FUROSEMIDE (LASIX) 20 MG TABLET    Take 10 mg by mouth every other day.    LEVOTHYROXINE (SYNTHROID, LEVOTHROID) 50 MCG TABLET    Take 50 mcg by mouth every morning.    LISINOPRIL (PRINIVIL,ZESTRIL) 40 MG TABLET    Take 20  mg by mouth every morning.    MELATONIN 3 MG TBDP    Take 3 mg by mouth at bedtime.   MULTIPLE VITAMIN (MULTIVITAMIN WITH MINERALS) TABS TABLET    Take 1 tablet by mouth every morning.   OMEPRAZOLE (PRILOSEC) 20 MG CAPSULE    Take 20 mg by mouth daily.   POLYETHYLENE GLYCOL (MIRALAX / GLYCOLAX) PACKET    Take 17 g by mouth daily as needed.    SERTRALINE (ZOLOFT) 50 MG TABLET    Take 50 mg by mouth at bedtime.   TIOTROPIUM (SPIRIVA) 18 MCG INHALATION CAPSULE    Place 18 mcg into inhaler and inhale daily.  Modified Medications   No medications on file  Discontinued Medications   No medications on file     Physical Exam: Physical Exam  Constitutional: She is oriented to person, place, and time. She appears well-developed and well-nourished. No distress.  HENT:  Head: Normocephalic and atraumatic.  Right Ear: External ear normal.  Left Ear: External ear normal.  Nose: Nose normal.  Mouth/Throat: Oropharynx is clear and moist. No oropharyngeal exudate.  Eyes: Conjunctivae and EOM are normal. Pupils are equal, round, and reactive to light. Right eye exhibits no discharge. Left eye exhibits no discharge. No scleral icterus.  Neck: Normal range of motion. Neck supple. No JVD present. No tracheal deviation present. No thyromegaly present.  Cardiovascular: Normal rate, regular rhythm, normal heart sounds and intact distal pulses.   No murmur heard. Pulmonary/Chest: Effort normal. No stridor. No respiratory distress. She has  decreased breath sounds. She has no wheezes. She has rales. She exhibits no tenderness.  bibasilar  Abdominal: Soft. She exhibits no distension and no mass. There is no tenderness. There is no rebound and no guarding.  Musculoskeletal: Normal range of motion. She exhibits edema and tenderness.  Ankles and feet have trace edema. Mid back pain.  Left shoulder to elbow pain with movements-upper arm and forearm ecchymoses and mild swelling.   Lymphadenopathy:    She has no cervical adenopathy.  Neurological: She is alert and oriented to person, place, and time. No cranial nerve deficit. She exhibits normal muscle tone. Coordination normal.  Left hemiparesis  Skin: Skin is warm and dry. No rash noted. She is not diaphoretic. No erythema. No pallor.  Mid abd surgical scar-healed-lower abd reducible incisional hernia-the patient has concerns of cosmetic reason but denied pain.  Left mastectomy Scalp skin lesion above the left ear.   Psychiatric: She has a normal mood and affect. Her behavior is normal. Judgment and thought content normal.  Repetitive.    Filed Vitals:   07/20/14 1144  BP: 130/72  Pulse: 68  Temp: 97.6 F (36.4 C)  TempSrc: Tympanic  Resp: 18      Labs reviewed: Basic Metabolic Panel:  Recent Labs  12/09/13 0755 12/18/13 1621 12/19/13 0404 01/15/14 07/18/14  NA 141 144 141 139 140  K 3.9 3.5* 3.7 4.8 4.4  CL 101 100 102  --   --   CO2 27 26 27   --   --   GLUCOSE 117* 93 122*  --   --   BUN 20 13 21 14  30*  CREATININE 0.64 0.56 0.74 0.6 0.6  CALCIUM 9.6 10.1 9.2  --   --   TSH  --   --   --  2.23 2.88   Liver Function Tests:  Recent Labs  12/18/13 1621 01/15/14 07/18/14  AST 20 18 20   ALT 16 17 15   ALKPHOS  102 89 84  BILITOT 0.5  --   --   PROT 7.5  --   --   ALBUMIN 4.4  --   --    No results for input(s): LIPASE, AMYLASE in the last 8760 hours. No results for input(s): AMMONIA in the last 8760 hours. CBC:  Recent Labs  12/09/13 0755  12/18/13 1621 12/19/13 0404 12/20/13 0402 01/15/14 07/18/14  WBC 9.1 6.5 8.3 6.1 5.3 30.5  NEUTROABS 7.1 4.6  --   --   --   --   HGB 15.9* 17.2* 15.5* 15.2* 14.9 10.9*  HCT 47.8* 51.4* 47.6* 45.9 45 33*  MCV 97.4 94.8 96.4 96.6  --   --   PLT 390 450* 427* 419* 440* 98*   Lipid Panel: No results for input(s): CHOL, HDL, LDLCALC, TRIG, CHOLHDL, LDLDIRECT in the last 8760 hours.  Past Procedures:  12/18/13 CT head w/o CM:   IMPRESSION:  1. There is no acute intracranial hemorrhage nor other acute  abnormality of the brain. There are extensive changes of chronic  small vessel ischemia.  2. There is no acute skull fracture.   12/18/13 X-ray Chest, Lumbar spine, Thoracic spine:  IMPRESSION: T5 compression fracture stable since 12/10/2008.  T10 compression fracture appears new since 12/09/2013.  IMPRESSION: No evidence for lumbar spine fracture.  Compression deformity at the level of T10. Comparing back to a chest x-ray from 12/09/2013, this appears new in the interval.  IMPRESSION: Collapse of the T3 and T8 vertebral bodies which may be recent. Old healed fracture of left lateral seventh rib. Lungs clear. No pneumothorax.   Assessment/Plan Closed fracture of distal end of left humerus with routine healing 07/17/14 X-ray question of distal humeral fracture-Ortho consult-ROM as tolerated.     HTN (hypertension) Controlled, takes Clonidine 0.1mg  daily, Carvedilol  12.5mg  bid, Cardizem 120mg , Lisinopril 74m, and Furosemide 10mg  qod.      Insomnia Prn Ambien is needed.     Constipation Controlled. Continue MiraLax daily and prn.     Hypothyroidism Takes Levothyroxine 64mcg daily, TSH 2.88 07/18/14     Edema Ankles/feet--takes Furosemide 10mg  qod. No significant weight change.    Depression Has been on Zoloft 50mg  for over 16 years, she seems more depressed, no significant change since Zoloft 75mg . Observe the patient. 03/13/14 stable, continue  Zoloft 75mg  daily.  --mood is stabilized   Back pain, thoracic 0/16/15 T 10 Dr. Newman Pies: PCP managing pain.  01/12/14 schedule Norco q am and continue prn q6h -- pain is reasonably controlled.      GERD (gastroesophageal reflux disease) Hx. PPI in the past. Omeprazole 20mg  daily. Observe.  --stable    Pain of left upper arm 07/18/14 X-ray left elbow, humerus, and shoulder: question of distal humeral fracture. Positional pain noted, no focal weakness, resolving swelling and ecchymoses. Ortho consult pending.     Lethargy Returned to her baseline.     Family/ Staff Communication: observe the patient.   Goals of Care: SNF  Labs/tests ordered: CBC with diff

## 2014-07-23 DIAGNOSIS — D649 Anemia, unspecified: Secondary | ICD-10-CM | POA: Diagnosis not present

## 2014-07-23 LAB — CBC AND DIFFERENTIAL
HCT: 48 % — AB (ref 36–46)
Hemoglobin: 16.1 g/dL — AB (ref 12.0–16.0)
Platelets: 367 10*3/uL (ref 150–399)
WBC: 5.2 10^3/mL

## 2014-07-24 ENCOUNTER — Other Ambulatory Visit: Payer: Self-pay | Admitting: Nurse Practitioner

## 2014-07-24 DIAGNOSIS — G4700 Insomnia, unspecified: Secondary | ICD-10-CM | POA: Diagnosis not present

## 2014-07-24 DIAGNOSIS — S42402D Unspecified fracture of lower end of left humerus, subsequent encounter for fracture with routine healing: Secondary | ICD-10-CM

## 2014-07-24 DIAGNOSIS — E039 Hypothyroidism, unspecified: Secondary | ICD-10-CM | POA: Diagnosis not present

## 2014-07-24 DIAGNOSIS — R2681 Unsteadiness on feet: Secondary | ICD-10-CM | POA: Diagnosis not present

## 2014-07-24 DIAGNOSIS — Z9181 History of falling: Secondary | ICD-10-CM | POA: Diagnosis not present

## 2014-07-24 DIAGNOSIS — I6350 Cerebral infarction due to unspecified occlusion or stenosis of unspecified cerebral artery: Secondary | ICD-10-CM | POA: Diagnosis not present

## 2014-07-24 DIAGNOSIS — M25512 Pain in left shoulder: Secondary | ICD-10-CM | POA: Diagnosis not present

## 2014-07-24 DIAGNOSIS — D72829 Elevated white blood cell count, unspecified: Secondary | ICD-10-CM

## 2014-07-24 DIAGNOSIS — M6281 Muscle weakness (generalized): Secondary | ICD-10-CM | POA: Diagnosis not present

## 2014-07-24 DIAGNOSIS — E870 Hyperosmolality and hypernatremia: Secondary | ICD-10-CM | POA: Diagnosis not present

## 2014-07-27 DIAGNOSIS — M6281 Muscle weakness (generalized): Secondary | ICD-10-CM | POA: Diagnosis not present

## 2014-07-27 DIAGNOSIS — E039 Hypothyroidism, unspecified: Secondary | ICD-10-CM | POA: Diagnosis not present

## 2014-07-27 DIAGNOSIS — E870 Hyperosmolality and hypernatremia: Secondary | ICD-10-CM | POA: Diagnosis not present

## 2014-07-27 DIAGNOSIS — M25512 Pain in left shoulder: Secondary | ICD-10-CM | POA: Diagnosis not present

## 2014-07-27 DIAGNOSIS — R2681 Unsteadiness on feet: Secondary | ICD-10-CM | POA: Diagnosis not present

## 2014-07-27 DIAGNOSIS — Z9181 History of falling: Secondary | ICD-10-CM | POA: Diagnosis not present

## 2014-07-30 DIAGNOSIS — R2681 Unsteadiness on feet: Secondary | ICD-10-CM | POA: Diagnosis not present

## 2014-07-30 DIAGNOSIS — E870 Hyperosmolality and hypernatremia: Secondary | ICD-10-CM | POA: Diagnosis not present

## 2014-07-30 DIAGNOSIS — M6281 Muscle weakness (generalized): Secondary | ICD-10-CM | POA: Diagnosis not present

## 2014-07-30 DIAGNOSIS — E039 Hypothyroidism, unspecified: Secondary | ICD-10-CM | POA: Diagnosis not present

## 2014-07-30 DIAGNOSIS — M25512 Pain in left shoulder: Secondary | ICD-10-CM | POA: Diagnosis not present

## 2014-07-30 DIAGNOSIS — Z9181 History of falling: Secondary | ICD-10-CM | POA: Diagnosis not present

## 2014-07-31 ENCOUNTER — Non-Acute Institutional Stay (SKILLED_NURSING_FACILITY): Payer: Medicare Other | Admitting: Nurse Practitioner

## 2014-07-31 ENCOUNTER — Encounter: Payer: Self-pay | Admitting: Nurse Practitioner

## 2014-07-31 DIAGNOSIS — F329 Major depressive disorder, single episode, unspecified: Secondary | ICD-10-CM | POA: Diagnosis not present

## 2014-07-31 DIAGNOSIS — E039 Hypothyroidism, unspecified: Secondary | ICD-10-CM | POA: Diagnosis not present

## 2014-07-31 DIAGNOSIS — D72829 Elevated white blood cell count, unspecified: Secondary | ICD-10-CM

## 2014-07-31 DIAGNOSIS — M25512 Pain in left shoulder: Secondary | ICD-10-CM | POA: Diagnosis not present

## 2014-07-31 DIAGNOSIS — I10 Essential (primary) hypertension: Secondary | ICD-10-CM | POA: Diagnosis not present

## 2014-07-31 DIAGNOSIS — S42402D Unspecified fracture of lower end of left humerus, subsequent encounter for fracture with routine healing: Secondary | ICD-10-CM | POA: Diagnosis not present

## 2014-07-31 DIAGNOSIS — M79622 Pain in left upper arm: Secondary | ICD-10-CM

## 2014-07-31 DIAGNOSIS — M546 Pain in thoracic spine: Secondary | ICD-10-CM | POA: Diagnosis not present

## 2014-07-31 DIAGNOSIS — Z9181 History of falling: Secondary | ICD-10-CM | POA: Diagnosis not present

## 2014-07-31 DIAGNOSIS — K432 Incisional hernia without obstruction or gangrene: Secondary | ICD-10-CM | POA: Diagnosis not present

## 2014-07-31 DIAGNOSIS — R2681 Unsteadiness on feet: Secondary | ICD-10-CM | POA: Diagnosis not present

## 2014-07-31 DIAGNOSIS — R609 Edema, unspecified: Secondary | ICD-10-CM | POA: Diagnosis not present

## 2014-07-31 DIAGNOSIS — K219 Gastro-esophageal reflux disease without esophagitis: Secondary | ICD-10-CM

## 2014-07-31 DIAGNOSIS — R5383 Other fatigue: Secondary | ICD-10-CM

## 2014-07-31 DIAGNOSIS — E870 Hyperosmolality and hypernatremia: Secondary | ICD-10-CM | POA: Diagnosis not present

## 2014-07-31 DIAGNOSIS — F32A Depression, unspecified: Secondary | ICD-10-CM

## 2014-07-31 DIAGNOSIS — M6281 Muscle weakness (generalized): Secondary | ICD-10-CM | POA: Diagnosis not present

## 2014-07-31 NOTE — Progress Notes (Signed)
Patient ID: Dana George, female   DOB: 09/15/1923, 81 y.o.   MRN: [id number]   Code Status: DNR  No Known Allergies  Chief Complaint  Patient presents with  . Medical Management of Chronic Issues  . Acute Visit    concern of abd hernia    HPI: Patient is a 81 y.o. female seen in the SNF at [facility] today for evaluation of abd hernia and chronic medical conditions.  Problem List Items Addressed This Visit    HTN (hypertension) - Primary    Permissive blood pressure control-usually Sbp in 140-150s, takes Clonidine 0.1mg  daily, Carvedilol  12.5mg  bid, Cardizem 120mg , Lisinopril 28m, and Furosemide 10mg  qod.          Hypothyroidism    Takes Levothyroxine 28mcg daily, TSH 2.88 07/18/14        Edema    Ankles/feet--takes Furosemide 10mg  qod. No significant weight change.        Depression    Has been on Zoloft 50mg  for over 16 years, she seems more depressed, no significant change since Zoloft 75mg . Observe the patient. 03/13/14 stable, continue Zoloft 75mg  daily.  --mood is stabilized       Back pain, thoracic    0/16/15 T 10 Dr. Newman Pies: PCP managing pain.  01/12/14 schedule Norco q am -- pain is reasonably controlled.         GERD (gastroesophageal reflux disease)    Hx. PPI in the past. Omeprazole 20mg  daily. Observe.  --stable        Incisional hernia, without obstruction or gangrene    Lower abd from previous surgery-2-3 small incisional hernias which are reducible-no incarceration or pain complained. Continue to observe the patient.         Pain of left upper arm    Resolved.       Lethargy    Her baseline mentation and usual state of health.       Closed fracture of distal end of left humerus with routine healing    07/17/14 X-ray question of distal humeral fracture-Ortho consult-ROM as tolerated.  07/20/14 Ortho: contusion of left upper extremity-no fracture        Leukocytosis    07/18/14 wbc 30.5, Hgb 10.9 dropped from  14.5 01/15/14, plt 98 dropped from 446 01/15/14-acute blood loss from subcutaneous blood loss of the left arm vs hematological issue-f/u CBC with diff and may consider hematology consult is no improvement.  07/23/14 wbc 5.2         Review of Systems:  Review of Systems  Constitutional: Negative for fever, chills, diaphoresis and fatigue.  HENT: Positive for hearing loss. Negative for congestion, ear discharge, ear pain, nosebleeds, sore throat and tinnitus.   Eyes: Negative for photophobia, pain, discharge and redness.  Respiratory: Negative for cough, shortness of breath, wheezing and stridor.   Cardiovascular: Positive for leg swelling. Negative for chest pain and palpitations.       Trace in ankles.   Gastrointestinal: Negative for nausea, vomiting, abdominal pain, diarrhea, constipation and blood in stool.       Lower abd incisional hernia.   Endocrine: Negative for polydipsia.  Genitourinary: Positive for frequency. Negative for dysuria, urgency, hematuria and flank pain.  Musculoskeletal: Positive for back pain and arthralgias. Negative for myalgias and neck pain.       Improved back pain. She is able to ambulates with walker on unit.    Skin: Negative for rash.       Lower abd incisional  hernia-reducible  Left upper arm and forearm ecchymoses  Allergic/Immunologic: Negative for environmental allergies.  Neurological: Negative for dizziness, tremors, seizures, weakness and headaches.  Hematological: Does not bruise/bleed easily.  Psychiatric/Behavioral: Negative for suicidal ideas and hallucinations. The patient is not nervous/anxious.      Past Medical History  Diagnosis Date  . Hypothyroidism   . Hypertension   . Shortness of breath   . COPD (chronic obstructive pulmonary disease)   . Osteoporosis   . Cancer 1980    Breast cancer  . TIA (transient ischemic attack)   . Incisional hernia, without obstruction or gangrene 05/27/2014    Lower abd from previous surgery-2-3  small incisional hernias which are reducible-no incarceration or pain complained.    . Insomnia 11/18/2012  . Thoracic spine fracture 12/18/2013    01/05/14 T 10 Dr. Newman Pies: PCP managing pain.  F/u 2 months(suggested that if the pain is "not too bad" and seems to be improving that she "live with it" and give a change to heal without intervention.  12/18/13 X-ray Thoracic spine: Compression deformity at the level of T10. Comparing back to a chest x-ray from 12/09/2013, this appears new in the interval.       . Back pain, thoracic 01/12/2014  . Left hemiparesis 06/08/2014  . CVA (cerebral infarction) 06/08/2011    Following abdominal surgery for bowel obstruction   . Granuloma annulare 06/08/2014  . Constipation 11/18/2012  . Depression 12/26/2013  . DVT (deep venous thrombosis) 11/18/2012  . Fall 02/07/2013  . GERD (gastroesophageal reflux disease) 03/30/2014  . HTN (hypertension) 11/18/2012    01/15/14 Bun/creat 14/0.60   . Ventral hernia 06/08/2014   Past Surgical History  Procedure Laterality Date  . Abdominal hysterectomy    . Total knee arthroplasty  2010    left knee  . Mastectomy  1980    left side  . Tonsillectomy    . Exploratory laparotomy w/ bowel resection      10/19/12-11/01/12 [facility] for exploratory laparotomty with extensive lysis of adhesion and segmental small bowel resection with end to end anastomosis per Dr. Isidore Moos 10/22/12  . Cataract extraction, bilateral     Social History:   reports that she quit smoking about 28 years ago. She does not have any smokeless tobacco history on file. She reports that she does not drink alcohol or use illicit drugs.  Family History  Problem Relation Age of Onset  . High blood pressure Mother   . Osteoporosis Mother   . Heart attack Father     d/o 43    Medications: Patient's Medications  New Prescriptions   No medications on file  Previous Medications   ACETAMINOPHEN (TYLENOL) 325 MG TABLET    Take 650 mg by mouth every  4 (four) hours as needed for moderate pain.   ASPIRIN 81 MG CHEWABLE TABLET    Chew 81 mg by mouth every morning.   CARVEDILOL (COREG) 12.5 MG TABLET    Take 12.5 mg by mouth 2 (two) times daily with a meal.   CLONIDINE (CATAPRES) 0.1 MG TABLET    Take 0.1 mg by mouth every morning.    DILTIAZEM (CARDIZEM CD) 120 MG 24 HR CAPSULE    Take 120 mg by mouth daily.    DOCUSATE SODIUM (COLACE) 100 MG CAPSULE    Take 1 capsule (100 mg total) by mouth 2 (two) times daily.   FUROSEMIDE (LASIX) 20 MG TABLET    Take 10 mg by mouth every other day.  LEVOTHYROXINE (SYNTHROID, LEVOTHROID) 50 MCG TABLET    Take 50 mcg by mouth every morning.    LISINOPRIL (PRINIVIL,ZESTRIL) 40 MG TABLET    Take 20 mg by mouth every morning.    MELATONIN 3 MG TBDP    Take 3 mg by mouth at bedtime.   MULTIPLE VITAMIN (MULTIVITAMIN WITH MINERALS) TABS TABLET    Take 1 tablet by mouth every morning.   OMEPRAZOLE (PRILOSEC) 20 MG CAPSULE    Take 20 mg by mouth daily.   POLYETHYLENE GLYCOL (MIRALAX / GLYCOLAX) PACKET    Take 17 g by mouth daily as needed.    SERTRALINE (ZOLOFT) 50 MG TABLET    Take 50 mg by mouth at bedtime.   TIOTROPIUM (SPIRIVA) 18 MCG INHALATION CAPSULE    Place 18 mcg into inhaler and inhale daily.  Modified Medications   No medications on file  Discontinued Medications   No medications on file     Physical Exam: Physical Exam  Constitutional: She is oriented to person, place, and time. She appears well-developed and well-nourished. No distress.  HENT:  Head: Normocephalic and atraumatic.  Right Ear: External ear normal.  Left Ear: External ear normal.  Nose: Nose normal.  Mouth/Throat: Oropharynx is clear and moist. No oropharyngeal exudate.  Eyes: Conjunctivae and EOM are normal. Pupils are equal, round, and reactive to light. Right eye exhibits no discharge. Left eye exhibits no discharge. No scleral icterus.  Neck: Normal range of motion. Neck supple. No JVD present. No tracheal deviation  present. No thyromegaly present.  Cardiovascular: Normal rate, regular rhythm, normal heart sounds and intact distal pulses.   No murmur heard. Pulmonary/Chest: Effort normal. No stridor. No respiratory distress. She has decreased breath sounds. She has no wheezes. She has rales. She exhibits no tenderness.  bibasilar  Abdominal: Soft. She exhibits no distension and no mass. There is no tenderness. There is no rebound and no guarding.  Musculoskeletal: Normal range of motion. She exhibits edema and tenderness.  Ankles and feet have trace edema. Mid back pain.    Lymphadenopathy:    She has no cervical adenopathy.  Neurological: She is alert and oriented to person, place, and time. No cranial nerve deficit. She exhibits normal muscle tone. Coordination normal.  Left hemiparesis  Skin: Skin is warm and dry. No rash noted. She is not diaphoretic. No erythema. No pallor.  Mid abd surgical scar-healed-lower abd reducible incisional hernia-the patient has concerns of cosmetic reason but denied pain.  Left mastectomy Scalp skin lesion above the left ear.   Psychiatric: She has a normal mood and affect. Her behavior is normal. Judgment and thought content normal.  Repetitive.    Filed Vitals:   07/31/14 1039  BP: 150/80  Pulse: 64  Temp: 98.3 F (36.8 C)  TempSrc: Tympanic  Resp: 20      Labs reviewed: Basic Metabolic Panel:  Recent Labs  12/09/13 0755 12/18/13 1621 12/19/13 0404 01/15/14 07/18/14  NA 141 144 141 139 140  K 3.9 3.5* 3.7 4.8 4.4  CL 101 100 102  --   --   CO2 27 26 27   --   --   GLUCOSE 117* 93 122*  --   --   BUN 20 13 21 14  30*  CREATININE 0.64 0.56 0.74 0.6 0.6  CALCIUM 9.6 10.1 9.2  --   --   TSH  --   --   --  2.23 2.88   Liver Function Tests:  Recent Labs  12/18/13  1621 01/15/14 07/18/14  AST 20 18 20   ALT 16 17 15   ALKPHOS 102 89 84  BILITOT 0.5  --   --   PROT 7.5  --   --   ALBUMIN 4.4  --   --    No results for input(s): LIPASE, AMYLASE  in the last 8760 hours. No results for input(s): AMMONIA in the last 8760 hours. CBC:  Recent Labs  12/09/13 0755 12/18/13 1621 12/19/13 0404 12/20/13 0402 01/15/14 07/18/14 07/23/14  WBC 9.1 6.5 8.3 6.1 5.3 30.5 5.2  NEUTROABS 7.1 4.6  --   --   --   --   --   HGB 15.9* 17.2* 15.5* 15.2* 14.9 10.9* 16.1*  HCT 47.8* 51.4* 47.6* 45.9 45 33* 48*  MCV 97.4 94.8 96.4 96.6  --   --   --   PLT 390 450* 427* 419* 440* 98* 367   Lipid Panel: No results for input(s): CHOL, HDL, LDLCALC, TRIG, CHOLHDL, LDLDIRECT in the last 8760 hours.  Past Procedures:  12/18/13 CT head w/o CM:   IMPRESSION:  1. There is no acute intracranial hemorrhage nor other acute  abnormality of the brain. There are extensive changes of chronic  small vessel ischemia.  2. There is no acute skull fracture.   12/18/13 X-ray Chest, Lumbar spine, Thoracic spine:  IMPRESSION: T5 compression fracture stable since 12/10/2008.  T10 compression fracture appears new since 12/09/2013.  IMPRESSION: No evidence for lumbar spine fracture.  Compression deformity at the level of T10. Comparing back to a chest x-ray from 12/09/2013, this appears new in the interval.  IMPRESSION: Collapse of the T3 and T8 vertebral bodies which may be recent. Old healed fracture of left lateral seventh rib. Lungs clear. No pneumothorax.   Assessment/Plan HTN (hypertension) Permissive blood pressure control-usually Sbp in 140-150s, takes Clonidine 0.1mg  daily, Carvedilol  12.5mg  bid, Cardizem 120mg , Lisinopril 51m, and Furosemide 10mg  qod.       Hypothyroidism Takes Levothyroxine 38mcg daily, TSH 2.88 07/18/14     Edema Ankles/feet--takes Furosemide 10mg  qod. No significant weight change.     Depression Has been on Zoloft 50mg  for over 16 years, she seems more depressed, no significant change since Zoloft 75mg . Observe the patient. 03/13/14 stable, continue Zoloft 75mg  daily.  --mood is stabilized    Back  pain, thoracic 0/16/15 T 10 Dr. Newman Pies: PCP managing pain.  01/12/14 schedule Norco q am -- pain is reasonably controlled.      GERD (gastroesophageal reflux disease) Hx. PPI in the past. Omeprazole 20mg  daily. Observe.  --stable     Incisional hernia, without obstruction or gangrene Lower abd from previous surgery-2-3 small incisional hernias which are reducible-no incarceration or pain complained. Continue to observe the patient.      Pain of left upper arm Resolved.    Lethargy Her baseline mentation and usual state of health.    Closed fracture of distal end of left humerus with routine healing 07/17/14 X-ray question of distal humeral fracture-Ortho consult-ROM as tolerated.  07/20/14 Ortho: contusion of left upper extremity-no fracture     Leukocytosis 07/18/14 wbc 30.5, Hgb 10.9 dropped from 14.5 01/15/14, plt 98 dropped from 446 01/15/14-acute blood loss from subcutaneous blood loss of the left arm vs hematological issue-f/u CBC with diff and may consider hematology consult is no improvement.  07/23/14 wbc 5.2    Family/ Staff Communication: observe the patient.   Goals of Care: SNF  Labs/tests ordered: none

## 2014-07-31 NOTE — Assessment & Plan Note (Signed)
Permissive blood pressure control-usually Sbp in 140-150s, takes Clonidine 0.1mg  daily, Carvedilol  12.5mg  bid, Cardizem 120mg , Lisinopril 65m, and Furosemide 10mg  qod.

## 2014-07-31 NOTE — Assessment & Plan Note (Signed)
Resolved

## 2014-07-31 NOTE — Assessment & Plan Note (Signed)
Takes Levothyroxine 63mcg daily, TSH 2.88 07/18/14

## 2014-07-31 NOTE — Assessment & Plan Note (Signed)
0/16/15 T 10 Dr. Newman Pies: PCP managing pain.  01/12/14 schedule Norco q am -- pain is reasonably controlled.

## 2014-07-31 NOTE — Assessment & Plan Note (Signed)
07/18/14 wbc 30.5, Hgb 10.9 dropped from 14.5 01/15/14, plt 98 dropped from 446 01/15/14-acute blood loss from subcutaneous blood loss of the left arm vs hematological issue-f/u CBC with diff and may consider hematology consult is no improvement.  07/23/14 wbc 5.2

## 2014-07-31 NOTE — Assessment & Plan Note (Signed)
Her baseline mentation and usual state of health.

## 2014-07-31 NOTE — Assessment & Plan Note (Signed)
07/17/14 X-ray question of distal humeral fracture-Ortho consult-ROM as tolerated.  07/20/14 Ortho: contusion of left upper extremity-no fracture

## 2014-07-31 NOTE — Assessment & Plan Note (Signed)
Ankles/feet--takes Furosemide 10mg  qod. No significant weight change.

## 2014-07-31 NOTE — Assessment & Plan Note (Signed)
Has been on Zoloft 50mg  for over 16 years, she seems more depressed, no significant change since Zoloft 75mg . Observe the patient. 03/13/14 stable, continue Zoloft 75mg  daily.  --mood is stabilized

## 2014-07-31 NOTE — Assessment & Plan Note (Signed)
Hx. PPI in the past. Omeprazole 20mg  daily. Observe.  --stable

## 2014-07-31 NOTE — Assessment & Plan Note (Signed)
Lower abd from previous surgery-2-3 small incisional hernias which are reducible-no incarceration or pain complained. Continue to observe the patient.

## 2014-08-02 DIAGNOSIS — E039 Hypothyroidism, unspecified: Secondary | ICD-10-CM | POA: Diagnosis not present

## 2014-08-02 DIAGNOSIS — E870 Hyperosmolality and hypernatremia: Secondary | ICD-10-CM | POA: Diagnosis not present

## 2014-08-02 DIAGNOSIS — Z9181 History of falling: Secondary | ICD-10-CM | POA: Diagnosis not present

## 2014-08-02 DIAGNOSIS — M6281 Muscle weakness (generalized): Secondary | ICD-10-CM | POA: Diagnosis not present

## 2014-08-02 DIAGNOSIS — M25512 Pain in left shoulder: Secondary | ICD-10-CM | POA: Diagnosis not present

## 2014-08-02 DIAGNOSIS — R2681 Unsteadiness on feet: Secondary | ICD-10-CM | POA: Diagnosis not present

## 2014-08-06 DIAGNOSIS — Z9181 History of falling: Secondary | ICD-10-CM | POA: Diagnosis not present

## 2014-08-06 DIAGNOSIS — M6281 Muscle weakness (generalized): Secondary | ICD-10-CM | POA: Diagnosis not present

## 2014-08-06 DIAGNOSIS — E870 Hyperosmolality and hypernatremia: Secondary | ICD-10-CM | POA: Diagnosis not present

## 2014-08-06 DIAGNOSIS — E039 Hypothyroidism, unspecified: Secondary | ICD-10-CM | POA: Diagnosis not present

## 2014-08-06 DIAGNOSIS — R2681 Unsteadiness on feet: Secondary | ICD-10-CM | POA: Diagnosis not present

## 2014-08-06 DIAGNOSIS — M25512 Pain in left shoulder: Secondary | ICD-10-CM | POA: Diagnosis not present

## 2014-08-07 DIAGNOSIS — Z9181 History of falling: Secondary | ICD-10-CM | POA: Diagnosis not present

## 2014-08-07 DIAGNOSIS — R2681 Unsteadiness on feet: Secondary | ICD-10-CM | POA: Diagnosis not present

## 2014-08-07 DIAGNOSIS — M6281 Muscle weakness (generalized): Secondary | ICD-10-CM | POA: Diagnosis not present

## 2014-08-07 DIAGNOSIS — E870 Hyperosmolality and hypernatremia: Secondary | ICD-10-CM | POA: Diagnosis not present

## 2014-08-07 DIAGNOSIS — E039 Hypothyroidism, unspecified: Secondary | ICD-10-CM | POA: Diagnosis not present

## 2014-08-07 DIAGNOSIS — M25512 Pain in left shoulder: Secondary | ICD-10-CM | POA: Diagnosis not present

## 2014-08-09 DIAGNOSIS — E039 Hypothyroidism, unspecified: Secondary | ICD-10-CM | POA: Diagnosis not present

## 2014-08-09 DIAGNOSIS — M25512 Pain in left shoulder: Secondary | ICD-10-CM | POA: Diagnosis not present

## 2014-08-09 DIAGNOSIS — E870 Hyperosmolality and hypernatremia: Secondary | ICD-10-CM | POA: Diagnosis not present

## 2014-08-09 DIAGNOSIS — Z9181 History of falling: Secondary | ICD-10-CM | POA: Diagnosis not present

## 2014-08-09 DIAGNOSIS — M6281 Muscle weakness (generalized): Secondary | ICD-10-CM | POA: Diagnosis not present

## 2014-08-09 DIAGNOSIS — R2681 Unsteadiness on feet: Secondary | ICD-10-CM | POA: Diagnosis not present

## 2014-08-13 DIAGNOSIS — Z9181 History of falling: Secondary | ICD-10-CM | POA: Diagnosis not present

## 2014-08-13 DIAGNOSIS — R2681 Unsteadiness on feet: Secondary | ICD-10-CM | POA: Diagnosis not present

## 2014-08-13 DIAGNOSIS — E870 Hyperosmolality and hypernatremia: Secondary | ICD-10-CM | POA: Diagnosis not present

## 2014-08-13 DIAGNOSIS — M6281 Muscle weakness (generalized): Secondary | ICD-10-CM | POA: Diagnosis not present

## 2014-08-13 DIAGNOSIS — E039 Hypothyroidism, unspecified: Secondary | ICD-10-CM | POA: Diagnosis not present

## 2014-08-13 DIAGNOSIS — M25512 Pain in left shoulder: Secondary | ICD-10-CM | POA: Diagnosis not present

## 2014-08-14 DIAGNOSIS — E039 Hypothyroidism, unspecified: Secondary | ICD-10-CM | POA: Diagnosis not present

## 2014-08-14 DIAGNOSIS — R2681 Unsteadiness on feet: Secondary | ICD-10-CM | POA: Diagnosis not present

## 2014-08-14 DIAGNOSIS — M6281 Muscle weakness (generalized): Secondary | ICD-10-CM | POA: Diagnosis not present

## 2014-08-14 DIAGNOSIS — M25512 Pain in left shoulder: Secondary | ICD-10-CM | POA: Diagnosis not present

## 2014-08-14 DIAGNOSIS — E870 Hyperosmolality and hypernatremia: Secondary | ICD-10-CM | POA: Diagnosis not present

## 2014-08-14 DIAGNOSIS — Z9181 History of falling: Secondary | ICD-10-CM | POA: Diagnosis not present

## 2014-08-17 DIAGNOSIS — Z9181 History of falling: Secondary | ICD-10-CM | POA: Diagnosis not present

## 2014-08-17 DIAGNOSIS — E870 Hyperosmolality and hypernatremia: Secondary | ICD-10-CM | POA: Diagnosis not present

## 2014-08-17 DIAGNOSIS — M25512 Pain in left shoulder: Secondary | ICD-10-CM | POA: Diagnosis not present

## 2014-08-17 DIAGNOSIS — M6281 Muscle weakness (generalized): Secondary | ICD-10-CM | POA: Diagnosis not present

## 2014-08-17 DIAGNOSIS — E039 Hypothyroidism, unspecified: Secondary | ICD-10-CM | POA: Diagnosis not present

## 2014-08-17 DIAGNOSIS — R2681 Unsteadiness on feet: Secondary | ICD-10-CM | POA: Diagnosis not present

## 2014-08-20 DIAGNOSIS — E039 Hypothyroidism, unspecified: Secondary | ICD-10-CM | POA: Diagnosis not present

## 2014-08-20 DIAGNOSIS — R2681 Unsteadiness on feet: Secondary | ICD-10-CM | POA: Diagnosis not present

## 2014-08-20 DIAGNOSIS — E870 Hyperosmolality and hypernatremia: Secondary | ICD-10-CM | POA: Diagnosis not present

## 2014-08-20 DIAGNOSIS — M6281 Muscle weakness (generalized): Secondary | ICD-10-CM | POA: Diagnosis not present

## 2014-08-20 DIAGNOSIS — Z9181 History of falling: Secondary | ICD-10-CM | POA: Diagnosis not present

## 2014-08-20 DIAGNOSIS — M25512 Pain in left shoulder: Secondary | ICD-10-CM | POA: Diagnosis not present

## 2014-08-31 ENCOUNTER — Encounter: Payer: Self-pay | Admitting: Nurse Practitioner

## 2014-08-31 ENCOUNTER — Non-Acute Institutional Stay (SKILLED_NURSING_FACILITY): Payer: Medicare Other | Admitting: Nurse Practitioner

## 2014-08-31 DIAGNOSIS — E039 Hypothyroidism, unspecified: Secondary | ICD-10-CM | POA: Diagnosis not present

## 2014-08-31 DIAGNOSIS — S5002XS Contusion of left elbow, sequela: Secondary | ICD-10-CM | POA: Diagnosis not present

## 2014-08-31 DIAGNOSIS — M79622 Pain in left upper arm: Secondary | ICD-10-CM | POA: Diagnosis not present

## 2014-08-31 DIAGNOSIS — R609 Edema, unspecified: Secondary | ICD-10-CM | POA: Diagnosis not present

## 2014-08-31 DIAGNOSIS — I10 Essential (primary) hypertension: Secondary | ICD-10-CM | POA: Diagnosis not present

## 2014-08-31 DIAGNOSIS — G4700 Insomnia, unspecified: Secondary | ICD-10-CM | POA: Diagnosis not present

## 2014-08-31 DIAGNOSIS — K219 Gastro-esophageal reflux disease without esophagitis: Secondary | ICD-10-CM

## 2014-08-31 DIAGNOSIS — D72829 Elevated white blood cell count, unspecified: Secondary | ICD-10-CM | POA: Diagnosis not present

## 2014-08-31 DIAGNOSIS — F329 Major depressive disorder, single episode, unspecified: Secondary | ICD-10-CM

## 2014-08-31 DIAGNOSIS — M546 Pain in thoracic spine: Secondary | ICD-10-CM

## 2014-08-31 DIAGNOSIS — F32A Depression, unspecified: Secondary | ICD-10-CM

## 2014-08-31 NOTE — Assessment & Plan Note (Signed)
Takes Levothyroxine 39mcg daily, TSH 2.88 07/18/14

## 2014-08-31 NOTE — Assessment & Plan Note (Signed)
Permissive blood pressure control-usually Sbp in 140-150s, takes Clonidine 0.1mg  daily, Carvedilol  12.5mg  bid, Cardizem 120mg , Lisinopril 54m, and Furosemide 10mg  qod.

## 2014-08-31 NOTE — Assessment & Plan Note (Signed)
Resolved, last wbc 5.2 07/23/14

## 2014-08-31 NOTE — Assessment & Plan Note (Signed)
0/16/15 T 10 Dr. Newman Pies: PCP managing pain.  01/12/14 schedule Norco q am -- pain is reasonably controlled.

## 2014-08-31 NOTE — Assessment & Plan Note (Signed)
Resolved.  07/06/14 X-ray L shoulder, humerus, elbow: no acute bony abnormalities. Mild to moderate degenerative changes in the acromioclavicular joint and bicipital groove. Hx.  07/18/14 X-ray left elbow, humerus, and shoulder: question of distal humeral fracture.

## 2014-08-31 NOTE — Progress Notes (Signed)
Patient ID: Dana George, female   DOB: 1923/07/14, 81 y.o.   MRN: [id number]   Code Status: DNR  No Known Allergies  Chief Complaint  Patient presents with  . Medical Management of Chronic Issues    HPI: Patient is a 81 y.o. female seen in the SNF at [facility] today for evaluation of chronic medical conditions.  Problem List Items Addressed This Visit    HTN (hypertension) - Primary    Permissive blood pressure control-usually Sbp in 140-150s, takes Clonidine 0.1mg  daily, Carvedilol  12.5mg  bid, Cardizem 120mg , Lisinopril 63m, and Furosemide 10mg  qod.         Insomnia    08/24/14 dc Ambien-not used.        Hypothyroidism    Takes Levothyroxine 28mcg daily, TSH 2.88 07/18/14        Edema    Ankles/feet--takes Furosemide 10mg  qod. No significant weight change.        Depression    Has been on Zoloft 50mg  for over 16 years, she seems more depressed, no significant change since Zoloft 75mg . Observe the patient. 03/13/14 stable, continue Zoloft 75mg  daily.  --mood is stabilized        Back pain, thoracic    0/16/15 T 10 Dr. Newman Pies: PCP managing pain.  01/12/14 schedule Norco q am -- pain is reasonably controlled.          GERD (gastroesophageal reflux disease)    Hx. PPI in the past. Omeprazole 20mg  daily. Observe.  --stable       Pain of left upper arm    Resolved.  07/06/14 X-ray L shoulder, humerus, elbow: no acute bony abnormalities. Mild to moderate degenerative changes in the acromioclavicular joint and bicipital groove. Hx.  07/18/14 X-ray left elbow, humerus, and shoulder: question of distal humeral fracture.        Left elbow contusion    healed      Leukocytosis    Resolved, last wbc 5.2 07/23/14         Review of Systems:  Review of Systems  Constitutional: Negative for fever, chills, diaphoresis and fatigue.  HENT: Positive for hearing loss. Negative for congestion, ear discharge, ear pain, nosebleeds, sore throat  and tinnitus.   Eyes: Negative for photophobia, pain, discharge and redness.  Respiratory: Negative for cough, shortness of breath, wheezing and stridor.   Cardiovascular: Positive for leg swelling. Negative for chest pain and palpitations.       Trace in ankles.   Gastrointestinal: Negative for nausea, vomiting, abdominal pain, diarrhea, constipation and blood in stool.       Lower abd incisional hernia.   Endocrine: Negative for polydipsia.  Genitourinary: Positive for frequency. Negative for dysuria, urgency, hematuria and flank pain.  Musculoskeletal: Positive for back pain and arthralgias. Negative for myalgias and neck pain.       Improved back pain. She is able to ambulates with walker on unit.    Skin: Negative for rash.       Lower abd incisional hernia-reducible  Left upper arm and forearm ecchymoses  Allergic/Immunologic: Negative for environmental allergies.  Neurological: Negative for dizziness, tremors, seizures, weakness and headaches.  Hematological: Does not bruise/bleed easily.  Psychiatric/Behavioral: Negative for suicidal ideas and hallucinations. The patient is not nervous/anxious.      Past Medical History  Diagnosis Date  . Hypothyroidism   . Hypertension   . Shortness of breath   . COPD (chronic obstructive pulmonary disease)   . Osteoporosis   .  Cancer 1980    Breast cancer  . TIA (transient ischemic attack)   . Incisional hernia, without obstruction or gangrene 05/27/2014    Lower abd from previous surgery-2-3 small incisional hernias which are reducible-no incarceration or pain complained.    . Insomnia 11/18/2012  . Thoracic spine fracture 12/18/2013    01/05/14 T 10 Dr. Newman Pies: PCP managing pain.  F/u 2 months(suggested that if the pain is "not too bad" and seems to be improving that she "live with it" and give a change to heal without intervention.  12/18/13 X-ray Thoracic spine: Compression deformity at the level of T10. Comparing back to a chest  x-ray from 12/09/2013, this appears new in the interval.       . Back pain, thoracic 01/12/2014  . Left hemiparesis 06/08/2014  . CVA (cerebral infarction) 06/08/2011    Following abdominal surgery for bowel obstruction   . Granuloma annulare 06/08/2014  . Constipation 11/18/2012  . Depression 12/26/2013  . DVT (deep venous thrombosis) 11/18/2012  . Fall 02/07/2013  . GERD (gastroesophageal reflux disease) 03/30/2014  . HTN (hypertension) 11/18/2012    01/15/14 Bun/creat 14/0.60   . Ventral hernia 06/08/2014   Past Surgical History  Procedure Laterality Date  . Abdominal hysterectomy    . Total knee arthroplasty  2010    left knee  . Mastectomy  1980    left side  . Tonsillectomy    . Exploratory laparotomy w/ bowel resection      10/19/12-11/01/12 [facility] for exploratory laparotomty with extensive lysis of adhesion and segmental small bowel resection with end to end anastomosis per Dr. Isidore Moos 10/22/12  . Cataract extraction, bilateral     Social History:   reports that she quit smoking about 28 years ago. She does not have any smokeless tobacco history on file. She reports that she does not drink alcohol or use illicit drugs.  Family History  Problem Relation Age of Onset  . High blood pressure Mother   . Osteoporosis Mother   . Heart attack Father     d/o 24    Medications: Patient's Medications  New Prescriptions   No medications on file  Previous Medications   ACETAMINOPHEN (TYLENOL) 325 MG TABLET    Take 650 mg by mouth every 4 (four) hours as needed for moderate pain.   ASPIRIN 81 MG CHEWABLE TABLET    Chew 81 mg by mouth every morning.   CARVEDILOL (COREG) 12.5 MG TABLET    Take 12.5 mg by mouth 2 (two) times daily with a meal.   CLONIDINE (CATAPRES) 0.1 MG TABLET    Take 0.1 mg by mouth every morning.    DILTIAZEM (CARDIZEM CD) 120 MG 24 HR CAPSULE    Take 120 mg by mouth daily.    DOCUSATE SODIUM (COLACE) 100 MG CAPSULE    Take 1 capsule (100 mg total) by mouth 2  (two) times daily.   FUROSEMIDE (LASIX) 20 MG TABLET    Take 10 mg by mouth every other day.    LEVOTHYROXINE (SYNTHROID, LEVOTHROID) 50 MCG TABLET    Take 50 mcg by mouth every morning.    LISINOPRIL (PRINIVIL,ZESTRIL) 40 MG TABLET    Take 20 mg by mouth every morning.    MELATONIN 3 MG TBDP    Take 3 mg by mouth at bedtime.   MULTIPLE VITAMIN (MULTIVITAMIN WITH MINERALS) TABS TABLET    Take 1 tablet by mouth every morning.   OMEPRAZOLE (PRILOSEC) 20 MG CAPSULE  Take 20 mg by mouth daily.   POLYETHYLENE GLYCOL (MIRALAX / GLYCOLAX) PACKET    Take 17 g by mouth daily as needed.    SERTRALINE (ZOLOFT) 50 MG TABLET    Take 50 mg by mouth at bedtime.   TIOTROPIUM (SPIRIVA) 18 MCG INHALATION CAPSULE    Place 18 mcg into inhaler and inhale daily.  Modified Medications   No medications on file  Discontinued Medications   No medications on file     Physical Exam: Physical Exam  Constitutional: She is oriented to person, place, and time. She appears well-developed and well-nourished. No distress.  HENT:  Head: Normocephalic and atraumatic.  Right Ear: External ear normal.  Left Ear: External ear normal.  Nose: Nose normal.  Mouth/Throat: Oropharynx is clear and moist. No oropharyngeal exudate.  Eyes: Conjunctivae and EOM are normal. Pupils are equal, round, and reactive to light. Right eye exhibits no discharge. Left eye exhibits no discharge. No scleral icterus.  Neck: Normal range of motion. Neck supple. No JVD present. No tracheal deviation present. No thyromegaly present.  Cardiovascular: Normal rate, regular rhythm, normal heart sounds and intact distal pulses.   No murmur heard. Pulmonary/Chest: Effort normal. No stridor. No respiratory distress. She has decreased breath sounds. She has no wheezes. She has rales. She exhibits no tenderness.  bibasilar  Abdominal: Soft. She exhibits no distension and no mass. There is no tenderness. There is no rebound and no guarding.    Musculoskeletal: Normal range of motion. She exhibits edema and tenderness.  Ankles and feet have trace edema. Mid back pain.    Lymphadenopathy:    She has no cervical adenopathy.  Neurological: She is alert and oriented to person, place, and time. No cranial nerve deficit. She exhibits normal muscle tone. Coordination normal.  Left hemiparesis  Skin: Skin is warm and dry. No rash noted. She is not diaphoretic. No erythema. No pallor.  Mid abd surgical scar-healed-lower abd reducible incisional hernia-the patient has concerns of cosmetic reason but denied pain.  Left mastectomy Scalp skin lesion above the left ear.   Psychiatric: She has a normal mood and affect. Her behavior is normal. Judgment and thought content normal.  Repetitive.    Filed Vitals:   08/31/14 1520  BP: 132/82  Pulse: 70  Temp: 97.7 F (36.5 C)  TempSrc: Tympanic  Resp: 20      Labs reviewed: Basic Metabolic Panel:  Recent Labs  12/09/13 0755 12/18/13 1621 12/19/13 0404 01/15/14 07/18/14  NA 141 144 141 139 140  K 3.9 3.5* 3.7 4.8 4.4  CL 101 100 102  --   --   CO2 27 26 27   --   --   GLUCOSE 117* 93 122*  --   --   BUN 20 13 21 14  30*  CREATININE 0.64 0.56 0.74 0.6 0.6  CALCIUM 9.6 10.1 9.2  --   --   TSH  --   --   --  2.23 2.88   Liver Function Tests:  Recent Labs  12/18/13 1621 01/15/14 07/18/14  AST 20 18 20   ALT 16 17 15   ALKPHOS 102 89 84  BILITOT 0.5  --   --   PROT 7.5  --   --   ALBUMIN 4.4  --   --    No results for input(s): LIPASE, AMYLASE in the last 8760 hours. No results for input(s): AMMONIA in the last 8760 hours. CBC:  Recent Labs  12/09/13 0755 12/18/13 1621 12/19/13 0404  12/20/13 0402 01/15/14 07/18/14 07/23/14  WBC 9.1 6.5 8.3 6.1 5.3 30.5 5.2  NEUTROABS 7.1 4.6  --   --   --   --   --   HGB 15.9* 17.2* 15.5* 15.2* 14.9 10.9* 16.1*  HCT 47.8* 51.4* 47.6* 45.9 45 33* 48*  MCV 97.4 94.8 96.4 96.6  --   --   --   PLT 390 450* 427* 419* 440* 98* 367    Lipid Panel: No results for input(s): CHOL, HDL, LDLCALC, TRIG, CHOLHDL, LDLDIRECT in the last 8760 hours.  Past Procedures:  12/18/13 CT head w/o CM:   IMPRESSION:  1. There is no acute intracranial hemorrhage nor other acute  abnormality of the brain. There are extensive changes of chronic  small vessel ischemia.  2. There is no acute skull fracture.   12/18/13 X-ray Chest, Lumbar spine, Thoracic spine:  IMPRESSION: T5 compression fracture stable since 12/10/2008.  T10 compression fracture appears new since 12/09/2013.  IMPRESSION: No evidence for lumbar spine fracture.  Compression deformity at the level of T10. Comparing back to a chest x-ray from 12/09/2013, this appears new in the interval.  IMPRESSION: Collapse of the T3 and T8 vertebral bodies which may be recent. Old healed fracture of left lateral seventh rib. Lungs clear. No pneumothorax.   Assessment/Plan HTN (hypertension) Permissive blood pressure control-usually Sbp in 140-150s, takes Clonidine 0.1mg  daily, Carvedilol  12.5mg  bid, Cardizem 120mg , Lisinopril 58m, and Furosemide 10mg  qod.     Insomnia 08/24/14 dc Ambien-not used.    Hypothyroidism Takes Levothyroxine 59mcg daily, TSH 2.88 07/18/14    Edema Ankles/feet--takes Furosemide 10mg  qod. No significant weight change.    Depression Has been on Zoloft 50mg  for over 16 years, she seems more depressed, no significant change since Zoloft 75mg . Observe the patient. 03/13/14 stable, continue Zoloft 75mg  daily.  --mood is stabilized    Back pain, thoracic 0/16/15 T 10 Dr. Newman Pies: PCP managing pain.  01/12/14 schedule Norco q am -- pain is reasonably controlled.      GERD (gastroesophageal reflux disease) Hx. PPI in the past. Omeprazole 20mg  daily. Observe.  --stable   Pain of left upper arm Resolved.  07/06/14 X-ray L shoulder, humerus, elbow: no acute bony abnormalities. Mild to moderate degenerative changes in the  acromioclavicular joint and bicipital groove. Hx.  07/18/14 X-ray left elbow, humerus, and shoulder: question of distal humeral fracture.    Left elbow contusion healed  Leukocytosis Resolved, last wbc 5.2 07/23/14   Family/ Staff Communication: observe the patient.   Goals of Care: SNF  Labs/tests ordered: none

## 2014-08-31 NOTE — Assessment & Plan Note (Signed)
healed 

## 2014-08-31 NOTE — Assessment & Plan Note (Signed)
Ankles/feet--takes Furosemide 10mg  qod. No significant weight change.

## 2014-08-31 NOTE — Assessment & Plan Note (Signed)
08/24/14 dc Ambien-not used.

## 2014-08-31 NOTE — Assessment & Plan Note (Signed)
Has been on Zoloft 50mg  for over 16 years, she seems more depressed, no significant change since Zoloft 75mg . Observe the patient. 03/13/14 stable, continue Zoloft 75mg  daily.  --mood is stabilized

## 2014-08-31 NOTE — Assessment & Plan Note (Signed)
Hx. PPI in the past. Omeprazole 20mg  daily. Observe.  --stable

## 2014-11-06 ENCOUNTER — Non-Acute Institutional Stay (SKILLED_NURSING_FACILITY): Payer: Medicare Other | Admitting: Nurse Practitioner

## 2014-11-06 DIAGNOSIS — G4700 Insomnia, unspecified: Secondary | ICD-10-CM

## 2014-11-06 DIAGNOSIS — J410 Simple chronic bronchitis: Secondary | ICD-10-CM

## 2014-11-06 DIAGNOSIS — K219 Gastro-esophageal reflux disease without esophagitis: Secondary | ICD-10-CM

## 2014-11-06 DIAGNOSIS — F329 Major depressive disorder, single episode, unspecified: Secondary | ICD-10-CM

## 2014-11-06 DIAGNOSIS — I10 Essential (primary) hypertension: Secondary | ICD-10-CM | POA: Diagnosis not present

## 2014-11-06 DIAGNOSIS — R609 Edema, unspecified: Secondary | ICD-10-CM

## 2014-11-06 DIAGNOSIS — F32A Depression, unspecified: Secondary | ICD-10-CM

## 2014-11-06 DIAGNOSIS — M546 Pain in thoracic spine: Secondary | ICD-10-CM | POA: Diagnosis not present

## 2014-11-06 DIAGNOSIS — E039 Hypothyroidism, unspecified: Secondary | ICD-10-CM | POA: Diagnosis not present

## 2014-11-09 ENCOUNTER — Encounter: Payer: Self-pay | Admitting: Nurse Practitioner

## 2014-11-09 NOTE — Progress Notes (Signed)
Patient ID: Dana George, female   DOB: October 19, 1923, 81 y.o.   MRN: [id number]  Location:  SNF [facility] Provider:  Marlana Latus NP  Code Status:  DNR Goals of care: Advanced Directive information    Chief Complaint  Patient presents with  . Medical Management of Chronic Issues  . Acute Visit    cough x 2 weeks     HPI: Patient is a 81 y.o. female seen in the SNF at [facility] today for evaluation of hacking cough for 2 weeks, denied chest pain or sputum production, afebrile, no nocturnal orthopnea. Hx of HTN controlled, on Lisinopril, COPD takes Spiriva, hacking cough for 2 weeks, no chest pain, fever, or sputum production, Insomnia has been stable w/o med, Hypothyroidism with last TSH in April 2016 wnl, chronic edema has no change, taking Furosemide 10mg  daily, also her back pain has been much improved, she is able to ambulate with walker on unit, her mood has been stable, no trouble sleeping or eating or participating daily activities.   Review of Systems:  Review of Systems  Constitutional: Negative for fever, chills and diaphoresis.  HENT: Positive for hearing loss. Negative for congestion, ear discharge, ear pain, nosebleeds, sore throat and tinnitus.   Eyes: Negative for photophobia, pain, discharge and redness.  Respiratory: Positive for cough. Negative for sputum production, shortness of breath, wheezing and stridor.   Cardiovascular: Positive for leg swelling. Negative for chest pain, palpitations and orthopnea.       Trace in ankles.   Gastrointestinal: Negative for nausea, vomiting, abdominal pain, diarrhea, constipation and blood in stool.       Lower abd incisional hernia.   Genitourinary: Positive for frequency. Negative for dysuria, urgency, hematuria and flank pain.  Musculoskeletal: Positive for back pain. Negative for myalgias and neck pain.       Improved back pain. She is able to ambulates with walker on unit.    Skin: Negative for rash.       Lower abd  incisional hernia-reducible    Neurological: Negative for dizziness, tremors, seizures, weakness and headaches.  Endo/Heme/Allergies: Negative for environmental allergies and polydipsia. Does not bruise/bleed easily.  Psychiatric/Behavioral: Negative for suicidal ideas and hallucinations. The patient is not nervous/anxious.     Past Medical History  Diagnosis Date  . Hypothyroidism   . Hypertension   . Shortness of breath   . COPD (chronic obstructive pulmonary disease)   . Osteoporosis   . Cancer 1980    Breast cancer  . TIA (transient ischemic attack)   . Incisional hernia, without obstruction or gangrene 05/27/2014    Lower abd from previous surgery-2-3 small incisional hernias which are reducible-no incarceration or pain complained.    . Insomnia 11/18/2012  . Thoracic spine fracture 12/18/2013    01/05/14 T 10 Dr. Newman Pies: PCP managing pain.  F/u 2 months(suggested that if the pain is "not too bad" and seems to be improving that she "live with it" and give a change to heal without intervention.  12/18/13 X-ray Thoracic spine: Compression deformity at the level of T10. Comparing back to a chest x-ray from 12/09/2013, this appears new in the interval.       . Back pain, thoracic 01/12/2014  . Left hemiparesis 06/08/2014  . CVA (cerebral infarction) 06/08/2011    Following abdominal surgery for bowel obstruction   . Granuloma annulare 06/08/2014  . Constipation 11/18/2012  . Depression 12/26/2013  . DVT (deep venous thrombosis) 11/18/2012  . Fall 02/07/2013  .  GERD (gastroesophageal reflux disease) 03/30/2014  . HTN (hypertension) 11/18/2012    01/15/14 Bun/creat 14/0.60   . Ventral hernia 06/08/2014    Patient Active Problem List   Diagnosis Date Noted  . Left elbow contusion 07/20/2014  . Leukocytosis 07/20/2014  . Lethargy 07/17/2014  . Pain of left upper arm 07/06/2014  . Left hemiparesis 06/08/2014  . Granuloma annulare 06/08/2014  . Ventral hernia 06/08/2014  .  Incisional hernia, without obstruction or gangrene 05/27/2014  . Conjunctivitis 05/15/2014  . GERD (gastroesophageal reflux disease) 03/30/2014  . Back pain, thoracic 01/12/2014  . Depression 12/26/2013  . Thoracic spine fracture 12/18/2013  . Fall 02/07/2013  . Chest pain 02/07/2013  . Edema 11/25/2012  . TIA (transient ischemic attack) 11/18/2012  . HTN (hypertension) 11/18/2012  . COPD (chronic obstructive pulmonary disease) 11/18/2012  . Hx of breast cancer 11/18/2012  . DVT (deep venous thrombosis) 11/18/2012  . Insomnia 11/18/2012  . Constipation 11/18/2012  . Hypothyroidism 11/18/2012  . CVA (cerebral infarction) 10/25/2012    No Known Allergies  Medications: Patient's Medications  New Prescriptions   No medications on file  Previous Medications   ACETAMINOPHEN (TYLENOL) 325 MG TABLET    Take 650 mg by mouth every 4 (four) hours as needed for moderate pain.   ASPIRIN 81 MG CHEWABLE TABLET    Chew 81 mg by mouth every morning.   CARVEDILOL (COREG) 12.5 MG TABLET    Take 12.5 mg by mouth 2 (two) times daily with a meal.   CLONIDINE (CATAPRES) 0.1 MG TABLET    Take 0.1 mg by mouth every morning.    DILTIAZEM (CARDIZEM CD) 120 MG 24 HR CAPSULE    Take 120 mg by mouth daily.    DOCUSATE SODIUM (COLACE) 100 MG CAPSULE    Take 1 capsule (100 mg total) by mouth 2 (two) times daily.   FUROSEMIDE (LASIX) 20 MG TABLET    Take 10 mg by mouth every other day.    LEVOTHYROXINE (SYNTHROID, LEVOTHROID) 50 MCG TABLET    Take 50 mcg by mouth every morning.    LISINOPRIL (PRINIVIL,ZESTRIL) 40 MG TABLET    Take 20 mg by mouth every morning.    MELATONIN 3 MG TBDP    Take 3 mg by mouth at bedtime.   MULTIPLE VITAMIN (MULTIVITAMIN WITH MINERALS) TABS TABLET    Take 1 tablet by mouth every morning.   OMEPRAZOLE (PRILOSEC) 20 MG CAPSULE    Take 20 mg by mouth daily.   POLYETHYLENE GLYCOL (MIRALAX / GLYCOLAX) PACKET    Take 17 g by mouth daily as needed.    SERTRALINE (ZOLOFT) 50 MG TABLET     Take 50 mg by mouth at bedtime.   TIOTROPIUM (SPIRIVA) 18 MCG INHALATION CAPSULE    Place 18 mcg into inhaler and inhale daily.  Modified Medications   No medications on file  Discontinued Medications   No medications on file    Physical Exam: Filed Vitals:   11/06/14 1239  BP: 150/64  Pulse: 66  Temp: 98.6 F (37 C)  TempSrc: Tympanic  Resp: 18   There is no weight on file to calculate BMI.  Physical Exam  Constitutional: She is oriented to person, place, and time. She appears well-developed and well-nourished. No distress.  HENT:  Head: Normocephalic and atraumatic.  Right Ear: External ear normal.  Left Ear: External ear normal.  Nose: Nose normal.  Mouth/Throat: Oropharynx is clear and moist. No oropharyngeal exudate.  Eyes: Conjunctivae and EOM are  normal. Pupils are equal, round, and reactive to light. Right eye exhibits no discharge. Left eye exhibits no discharge. No scleral icterus.  Neck: Normal range of motion. Neck supple. No JVD present. No tracheal deviation present. No thyromegaly present.  Cardiovascular: Normal rate, regular rhythm, normal heart sounds and intact distal pulses.   No murmur heard. Pulmonary/Chest: Effort normal. No stridor. No respiratory distress. She has decreased breath sounds. She has no wheezes. She has rales. She exhibits no tenderness.  bibasilar  Abdominal: Soft. She exhibits no distension and no mass. There is no tenderness. There is no rebound and no guarding.  Musculoskeletal: Normal range of motion. She exhibits edema and tenderness.  Ankles and feet have trace edema. Mid back pain.    Lymphadenopathy:    She has no cervical adenopathy.  Neurological: She is alert and oriented to person, place, and time. No cranial nerve deficit. She exhibits normal muscle tone. Coordination normal.  Left hemiparesis  Skin: Skin is warm and dry. No rash noted. She is not diaphoretic. No erythema. No pallor.  Mid abd surgical scar-healed-lower abd  reducible incisional hernia. S/p Left mastectomy surgical scar. Scalp skin lesion above the left ear.   Psychiatric: She has a normal mood and affect. Her behavior is normal. Judgment and thought content normal.  Repetitive.     Labs reviewed: Basic Metabolic Panel:  Recent Labs  12/09/13 0755 12/18/13 1621 12/19/13 0404 01/15/14 07/18/14  NA 141 144 141 139 140  K 3.9 3.5* 3.7 4.8 4.4  CL 101 100 102  --   --   CO2 27 26 27   --   --   GLUCOSE 117* 93 122*  --   --   BUN 20 13 21 14  30*  CREATININE 0.64 0.56 0.74 0.6 0.6  CALCIUM 9.6 10.1 9.2  --   --     Liver Function Tests:  Recent Labs  12/18/13 1621 01/15/14 07/18/14  AST 20 18 20   ALT 16 17 15   ALKPHOS 102 89 84  BILITOT 0.5  --   --   PROT 7.5  --   --   ALBUMIN 4.4  --   --     CBC:  Recent Labs  12/09/13 0755 12/18/13 1621 12/19/13 0404 12/20/13 0402 01/15/14 07/18/14 07/23/14  WBC 9.1 6.5 8.3 6.1 5.3 30.5 5.2  NEUTROABS 7.1 4.6  --   --   --   --   --   HGB 15.9* 17.2* 15.5* 15.2* 14.9 10.9* 16.1*  HCT 47.8* 51.4* 47.6* 45.9 45 33* 48*  MCV 97.4 94.8 96.4 96.6  --   --   --   PLT 390 450* 427* 419* 440* 98* 367    Lab Results  Component Value Date   TSH 2.88 07/18/2014   No results found for: HGBA1C Lab Results  Component Value Date   CHOL 121 11/22/2012   HDL 40 11/22/2012   LDLCALC 65 11/22/2012   TRIG 81 11/22/2012    Significant Diagnostic Results since last visit: none  Patient Care Team: Estill Dooms, MD as PCP - General (Internal Medicine) Gaynelle Arabian, MD as Consulting Physician (Orthopedic Surgery)  Assessment/Plan Problem List Items Addressed This Visit    HTN (hypertension) - Primary    Will dc Lisinopril to eliminate possible AR of cough, start Losartan 50mg  daily, monitor Bp.       COPD (chronic obstructive pulmonary disease)    Stable on Spiriva, may contributory to her cough, will switch from  Lisinopril to Losartan, adding Robitussin prn, continue PPI. Observe  the patient.        Insomnia    Stable since 08/24/14 dc'd Ambien-not used.        Hypothyroidism    Takes Levothyroxine 63mcg daily, TSH 2.88 07/18/14       Edema    Ankles/feet--takes Furosemide 10mg  qod. No significant weight change.       Depression    Has been on Zoloft 50mg  for over 16 years, she seems more depressed, no significant change since Zoloft 75mg . Observe the patient. 03/13/14 stable, continue Zoloft 75mg  daily.  --mood is stabilized        Back pain, thoracic    0/16/15 T 10 Dr. Newman Pies: PCP managing pain.  01/12/14 schedule Norco q am -- pain is reasonably controlled. Ambulates with walker on unit.        GERD (gastroesophageal reflux disease)    Hx. PPI in the past. Omeprazole 20mg  daily.  --stable, this condition may contribute to her hacking cough, may consider increase to 40mg  if cough persists after switch Lisinopril to Losartan.            Family/ staff Communication: none  Labs/tests ordered:  none  ManXie Elira Colasanti NP [facility] [address], [place] [address] On Call:  [phone] & follow prompts after 5pm & weekends Office Phone:  [phone] Office Fax:  [phone]

## 2014-11-09 NOTE — Assessment & Plan Note (Signed)
Stable since 08/24/14 dc'd Ambien-not used.

## 2014-11-09 NOTE — Assessment & Plan Note (Signed)
Stable on Spiriva, may contributory to her cough, will switch from Lisinopril to Losartan, adding Robitussin prn, continue PPI. Observe the patient.

## 2014-11-09 NOTE — Assessment & Plan Note (Signed)
Takes Levothyroxine 71mcg daily, TSH 2.88 07/18/14

## 2014-11-09 NOTE — Assessment & Plan Note (Signed)
0/16/15 T 10 Dr. Newman Pies: PCP managing pain.  01/12/14 schedule Norco q am -- pain is reasonably controlled. Ambulates with walker on unit.

## 2014-11-09 NOTE — Assessment & Plan Note (Signed)
Has been on Zoloft 50mg  for over 16 years, she seems more depressed, no significant change since Zoloft 75mg . Observe the patient. 03/13/14 stable, continue Zoloft 75mg  daily.  --mood is stabilized

## 2014-11-09 NOTE — Assessment & Plan Note (Signed)
Hx. PPI in the past. Omeprazole 20mg  daily.  --stable, this condition may contribute to her hacking cough, may consider increase to 40mg  if cough persists after switch Lisinopril to Losartan.

## 2014-11-09 NOTE — Assessment & Plan Note (Signed)
Will dc Lisinopril to eliminate possible AR of cough, start Losartan 50mg  daily, monitor Bp.

## 2014-11-09 NOTE — Assessment & Plan Note (Signed)
Ankles/feet--takes Furosemide 10mg  qod. No significant weight change.

## 2014-11-30 ENCOUNTER — Non-Acute Institutional Stay (SKILLED_NURSING_FACILITY): Payer: Medicare Other | Admitting: Nurse Practitioner

## 2014-11-30 ENCOUNTER — Encounter: Payer: Self-pay | Admitting: Nurse Practitioner

## 2014-11-30 DIAGNOSIS — F329 Major depressive disorder, single episode, unspecified: Secondary | ICD-10-CM

## 2014-11-30 DIAGNOSIS — F32A Depression, unspecified: Secondary | ICD-10-CM

## 2014-11-30 DIAGNOSIS — I10 Essential (primary) hypertension: Secondary | ICD-10-CM | POA: Diagnosis not present

## 2014-11-30 DIAGNOSIS — M546 Pain in thoracic spine: Secondary | ICD-10-CM | POA: Diagnosis not present

## 2014-11-30 DIAGNOSIS — M5440 Lumbago with sciatica, unspecified side: Secondary | ICD-10-CM | POA: Diagnosis not present

## 2014-11-30 DIAGNOSIS — K219 Gastro-esophageal reflux disease without esophagitis: Secondary | ICD-10-CM

## 2014-11-30 DIAGNOSIS — R609 Edema, unspecified: Secondary | ICD-10-CM | POA: Diagnosis not present

## 2014-11-30 DIAGNOSIS — E039 Hypothyroidism, unspecified: Secondary | ICD-10-CM | POA: Diagnosis not present

## 2014-11-30 DIAGNOSIS — J410 Simple chronic bronchitis: Secondary | ICD-10-CM

## 2014-11-30 DIAGNOSIS — M545 Low back pain, unspecified: Secondary | ICD-10-CM | POA: Insufficient documentation

## 2014-11-30 NOTE — Assessment & Plan Note (Signed)
continue Zoloft 75mg  daily, mood is stabilized

## 2014-11-30 NOTE — Assessment & Plan Note (Signed)
Stable, no change in tx.

## 2014-11-30 NOTE — Progress Notes (Signed)
Patient ID: Dana George, female   DOB: 10-12-1923, 81 y.o.   MRN: [id number]  Location:  SNF [facility] Provider:  Marlana Latus NP  Code Status:  DNR Goals of care: Advanced Directive information    Chief Complaint  Patient presents with  . Medical Management of Chronic Issues  . Acute Visit    left hip pain     HPI: Patient is a 81 y.o. female seen in the SNF at [facility] today for evaluation of new onset left hip pain 11/25/14 reported by staff, the patient stated pain is in her lower back above her iliac crest imaginary  line. Denied falling or injury, able to ambulate with walker on unit, no localized contusion or fever.   Review of Systems:  Review of Systems  Constitutional: Negative for fever, chills and diaphoresis.  HENT: Positive for hearing loss. Negative for congestion, ear discharge, ear pain, nosebleeds, sore throat and tinnitus.   Eyes: Negative for photophobia, pain, discharge and redness.  Respiratory: Negative for cough, sputum production, shortness of breath, wheezing and stridor.   Cardiovascular: Positive for leg swelling. Negative for chest pain, palpitations and orthopnea.       Trace in ankles.   Gastrointestinal: Negative for nausea, vomiting, abdominal pain, diarrhea, constipation and blood in stool.       Lower abd incisional hernia.   Genitourinary: Positive for frequency. Negative for dysuria, urgency, hematuria and flank pain.  Musculoskeletal: Positive for back pain and joint pain. Negative for myalgias and neck pain.       Improved back pain. She is able to ambulates with walker on unit.  Left hip pain since 11/25/14 per satff, worsens with ambulation, the patient indicated her pain is in her lower back.    Skin: Negative for rash.       Lower abd incisional hernia-reducible    Neurological: Negative for dizziness, tremors, seizures, weakness and headaches.  Endo/Heme/Allergies: Negative for environmental allergies and polydipsia. Does not  bruise/bleed easily.  Psychiatric/Behavioral: Negative for suicidal ideas and hallucinations. The patient is not nervous/anxious.     Past Medical History  Diagnosis Date  . Hypothyroidism   . Hypertension   . Shortness of breath   . COPD (chronic obstructive pulmonary disease)   . Osteoporosis   . Cancer 1980    Breast cancer  . TIA (transient ischemic attack)   . Incisional hernia, without obstruction or gangrene 05/27/2014    Lower abd from previous surgery-2-3 small incisional hernias which are reducible-no incarceration or pain complained.    . Insomnia 11/18/2012  . Thoracic spine fracture 12/18/2013    01/05/14 T 10 Dr. Newman Pies: PCP managing pain.  F/u 2 months(suggested that if the pain is "not too bad" and seems to be improving that she "live with it" and give a change to heal without intervention.  12/18/13 X-ray Thoracic spine: Compression deformity at the level of T10. Comparing back to a chest x-ray from 12/09/2013, this appears new in the interval.       . Back pain, thoracic 01/12/2014  . Left hemiparesis 06/08/2014  . CVA (cerebral infarction) 06/08/2011    Following abdominal surgery for bowel obstruction   . Granuloma annulare 06/08/2014  . Constipation 11/18/2012  . Depression 12/26/2013  . DVT (deep venous thrombosis) 11/18/2012  . Fall 02/07/2013  . GERD (gastroesophageal reflux disease) 03/30/2014  . HTN (hypertension) 11/18/2012    01/15/14 Bun/creat 14/0.60   . Ventral hernia 06/08/2014    Patient Active  Problem List   Diagnosis Date Noted  . Lower back pain 11/30/2014  . Leukocytosis 07/20/2014  . Pain of left upper arm 07/06/2014  . Left hemiparesis 06/08/2014  . Granuloma annulare 06/08/2014  . Ventral hernia 06/08/2014  . Incisional hernia, without obstruction or gangrene 05/27/2014  . Conjunctivitis 05/15/2014  . GERD (gastroesophageal reflux disease) 03/30/2014  . Back pain, thoracic 01/12/2014  . Depression 12/26/2013  . Thoracic spine fracture  12/18/2013  . Fall 02/07/2013  . Chest pain 02/07/2013  . Edema 11/25/2012  . TIA (transient ischemic attack) 11/18/2012  . HTN (hypertension) 11/18/2012  . COPD (chronic obstructive pulmonary disease) 11/18/2012  . Hx of breast cancer 11/18/2012  . DVT (deep venous thrombosis) 11/18/2012  . Insomnia 11/18/2012  . Constipation 11/18/2012  . Hypothyroidism 11/18/2012  . CVA (cerebral infarction) 10/25/2012    No Known Allergies  Medications: Patient's Medications  New Prescriptions   No medications on file  Previous Medications   ACETAMINOPHEN (TYLENOL) 325 MG TABLET    Take 650 mg by mouth every 4 (four) hours as needed for moderate pain.   ASPIRIN 81 MG CHEWABLE TABLET    Chew 81 mg by mouth every morning.   CARVEDILOL (COREG) 12.5 MG TABLET    Take 12.5 mg by mouth 2 (two) times daily with a meal.   CLONIDINE (CATAPRES) 0.1 MG TABLET    Take 0.1 mg by mouth every morning.    DILTIAZEM (CARDIZEM CD) 120 MG 24 HR CAPSULE    Take 120 mg by mouth daily.    DOCUSATE SODIUM (COLACE) 100 MG CAPSULE    Take 1 capsule (100 mg total) by mouth 2 (two) times daily.   FUROSEMIDE (LASIX) 20 MG TABLET    Take 10 mg by mouth every other day.    LEVOTHYROXINE (SYNTHROID, LEVOTHROID) 50 MCG TABLET    Take 50 mcg by mouth every morning.    LISINOPRIL (PRINIVIL,ZESTRIL) 40 MG TABLET    Take 20 mg by mouth every morning.    MELATONIN 3 MG TBDP    Take 3 mg by mouth at bedtime.   MULTIPLE VITAMIN (MULTIVITAMIN WITH MINERALS) TABS TABLET    Take 1 tablet by mouth every morning.   OMEPRAZOLE (PRILOSEC) 20 MG CAPSULE    Take 20 mg by mouth daily.   POLYETHYLENE GLYCOL (MIRALAX / GLYCOLAX) PACKET    Take 17 g by mouth daily as needed.    SERTRALINE (ZOLOFT) 50 MG TABLET    Take 50 mg by mouth at bedtime.   TIOTROPIUM (SPIRIVA) 18 MCG INHALATION CAPSULE    Place 18 mcg into inhaler and inhale daily.  Modified Medications   No medications on file  Discontinued Medications   No medications on file     Physical Exam: Filed Vitals:   11/30/14 1231  BP: 132/64  Pulse: 78  Temp: 98 F (36.7 C)  TempSrc: Tympanic  Resp: 20   There is no weight on file to calculate BMI.  Physical Exam  Constitutional: She is oriented to person, place, and time. She appears well-developed and well-nourished. No distress.  HENT:  Head: Normocephalic and atraumatic.  Right Ear: External ear normal.  Left Ear: External ear normal.  Nose: Nose normal.  Mouth/Throat: Oropharynx is clear and moist. No oropharyngeal exudate.  Eyes: Conjunctivae and EOM are normal. Pupils are equal, round, and reactive to light. Right eye exhibits no discharge. Left eye exhibits no discharge. No scleral icterus.  Neck: Normal range of motion. Neck supple. No  JVD present. No tracheal deviation present. No thyromegaly present.  Cardiovascular: Normal rate, regular rhythm, normal heart sounds and intact distal pulses.   No murmur heard. Pulmonary/Chest: Effort normal. No stridor. No respiratory distress. She has decreased breath sounds. She has no wheezes. She has rales. She exhibits no tenderness.  bibasilar  Abdominal: Soft. She exhibits no distension and no mass. There is no tenderness. There is no rebound and no guarding.  Musculoskeletal: Normal range of motion. She exhibits edema and tenderness.  Ankles and feet have trace edema. Mid back pain.  New onset left hip pain since 11/25/14 per staff, worsens with ambulation, no localized swelling or erythema or bruise, the patient stated her pain is in her lower back slightly above the iliac crest imaginary line.    Lymphadenopathy:    She has no cervical adenopathy.  Neurological: She is alert and oriented to person, place, and time. No cranial nerve deficit. She exhibits normal muscle tone. Coordination normal.  Left hemiparesis  Skin: Skin is warm and dry. No rash noted. She is not diaphoretic. No erythema. No pallor.  Mid abd surgical scar-healed-lower abd reducible  incisional hernia. S/p Left mastectomy surgical scar. Scalp skin lesion above the left ear.   Psychiatric: She has a normal mood and affect. Her behavior is normal. Judgment and thought content normal.  Repetitive.     Labs reviewed: Basic Metabolic Panel:  Recent Labs  12/09/13 0755 12/18/13 1621 12/19/13 0404 01/15/14 07/18/14  NA 141 144 141 139 140  K 3.9 3.5* 3.7 4.8 4.4  CL 101 100 102  --   --   CO2 27 26 27   --   --   GLUCOSE 117* 93 122*  --   --   BUN 20 13 21 14  30*  CREATININE 0.64 0.56 0.74 0.6 0.6  CALCIUM 9.6 10.1 9.2  --   --     Liver Function Tests:  Recent Labs  12/18/13 1621 01/15/14 07/18/14  AST 20 18 20   ALT 16 17 15   ALKPHOS 102 89 84  BILITOT 0.5  --   --   PROT 7.5  --   --   ALBUMIN 4.4  --   --     CBC:  Recent Labs  12/09/13 0755 12/18/13 1621 12/19/13 0404 12/20/13 0402 01/15/14 07/18/14 07/23/14  WBC 9.1 6.5 8.3 6.1 5.3 30.5 5.2  NEUTROABS 7.1 4.6  --   --   --   --   --   HGB 15.9* 17.2* 15.5* 15.2* 14.9 10.9* 16.1*  HCT 47.8* 51.4* 47.6* 45.9 45 33* 48*  MCV 97.4 94.8 96.4 96.6  --   --   --   PLT 390 450* 427* 419* 440* 98* 367    Lab Results  Component Value Date   TSH 2.88 07/18/2014   No results found for: HGBA1C Lab Results  Component Value Date   CHOL 121 11/22/2012   HDL 40 11/22/2012   LDLCALC 65 11/22/2012   TRIG 81 11/22/2012    Significant Diagnostic Results since last visit: none  Patient Care Team: Estill Dooms, MD as PCP - General (Internal Medicine) Gaynelle Arabian, MD as Consulting Physician (Orthopedic Surgery)  Assessment/Plan Problem List Items Addressed This Visit    HTN (hypertension)    Controlled, continue Losartan 50mg  daily, monitor Bp. Off Lisinopril for ? Etiology of URI symptoms.       COPD (chronic obstructive pulmonary disease)    Stable, continue Spiriva, better since off Lisinopril,  continue PPI and prn Robitussin.       Hypothyroidism    Continue Levothyroxine 22mcg  daily, TSH 2.88 07/18/14      Edema    Ankles/feet, chronic, continue Furosemide 10mg  qod. No significant weight change.      Depression    continue Zoloft 75mg  daily, mood is stabilized      Back pain, thoracic    0/16/15 T 10 Dr. Newman Pies: PCP managing pain.  01/12/14 schedule Norco q am -- pain is reasonably controlled. Ambulates with walker on unit. Continue Norco for pain.       GERD (gastroesophageal reflux disease)    Stable, no change in tx.       Lower back pain - Primary    C/o left hip pain 11/25/14, indicated pain in her lower back slightly above her iliac creat imaginary line, worsens with ambulation, denied fall or injury, no increased swelling or localized heat/erythma/contusion. Will X-ray L spine to r/o fx. Increase Norco to tid          Family/ staff Communication: continue to monitor the left hip pain.   Labs/tests ordered:  X-ary lumbar spine.   [facility] Tejay Hubert NP Geriatrics [facility] [phone], [place] [address] On Call:  [phone] & follow prompts after 5pm & weekends Office Phone:  [phone] Office Fax:  [phone]

## 2014-11-30 NOTE — Assessment & Plan Note (Signed)
Stable, continue Spiriva, better since off Lisinopril, continue PPI and prn Robitussin.

## 2014-11-30 NOTE — Assessment & Plan Note (Signed)
0/16/15 T 10 Dr. Newman Pies: PCP managing pain.  01/12/14 schedule Norco q am -- pain is reasonably controlled. Ambulates with walker on unit. Continue Norco for pain.

## 2014-11-30 NOTE — Assessment & Plan Note (Signed)
Ankles/feet, chronic, continue Furosemide 10mg  qod. No significant weight change.

## 2014-11-30 NOTE — Assessment & Plan Note (Addendum)
C/o left hip pain 11/25/14, indicated pain in her lower back slightly above her iliac creat imaginary line, worsens with ambulation, denied fall or injury, no increased swelling or localized heat/erythma/contusion. Will X-ray L spine to r/o fx. Increase Norco to tid

## 2014-11-30 NOTE — Assessment & Plan Note (Signed)
Controlled, continue Losartan 50mg  daily, monitor Bp. Off Lisinopril for ? Etiology of URI symptoms.

## 2014-11-30 NOTE — Assessment & Plan Note (Signed)
Continue Levothyroxine 92mcg daily, TSH 2.88 07/18/14

## 2014-12-03 ENCOUNTER — Encounter: Payer: Self-pay | Admitting: Internal Medicine

## 2014-12-03 ENCOUNTER — Non-Acute Institutional Stay (SKILLED_NURSING_FACILITY): Payer: Medicare Other | Admitting: Internal Medicine

## 2014-12-03 DIAGNOSIS — R05 Cough: Secondary | ICD-10-CM

## 2014-12-03 DIAGNOSIS — K219 Gastro-esophageal reflux disease without esophagitis: Secondary | ICD-10-CM | POA: Diagnosis not present

## 2014-12-03 DIAGNOSIS — M5440 Lumbago with sciatica, unspecified side: Secondary | ICD-10-CM | POA: Diagnosis not present

## 2014-12-03 DIAGNOSIS — I10 Essential (primary) hypertension: Secondary | ICD-10-CM | POA: Diagnosis not present

## 2014-12-03 DIAGNOSIS — R059 Cough, unspecified: Secondary | ICD-10-CM | POA: Insufficient documentation

## 2014-12-03 DIAGNOSIS — M25552 Pain in left hip: Secondary | ICD-10-CM | POA: Diagnosis not present

## 2014-12-03 DIAGNOSIS — E039 Hypothyroidism, unspecified: Secondary | ICD-10-CM | POA: Diagnosis not present

## 2014-12-03 NOTE — Progress Notes (Signed)
Patient ID: Dana George, female   DOB: 1923/10/17, 81 y.o.   MRN: [id number]    [place] Room Number: N 10  Place of Service: SNF (31)     No Known Allergies  Chief Complaint  Patient presents with  . Acute Visit    Follow-up left hip pain    HPI:  Acute follow-up visit for recent problems with left hip pain. Patient has known chronic back problems with sciatica down the left side. She says that she is pain-free today. Her pains in the back, and go.  There have been some recent issues with cough. She was changed from lisinopril to losartan to see if this would help. She says has not been any change in her cough over the last month. She has known diagnosis of COPD. She also has known GERD. She denies food hanging in her chest.  Patient is hypothyroid and it is time for a follow-up TSH  Patient requests reduction in her diuretics. She is only on furosemide 10 mg every other day, but it does not appear that she has any significant fluid retention at this time, so discontinuation of the furosemide may be feasible.  Medications: Patient's Medications  New Prescriptions   No medications on file  Previous Medications   ACETAMINOPHEN (TYLENOL) 325 MG TABLET    Take 650 mg by mouth every 4 (four) hours as needed for moderate pain.   ASPIRIN 81 MG CHEWABLE TABLET    Chew 81 mg by mouth every morning.   CARVEDILOL (COREG) 12.5 MG TABLET    Take 12.5 mg by mouth 2 (two) times daily with a meal.   CLONIDINE (CATAPRES) 0.1 MG TABLET    Take 0.1 mg by mouth every morning.    DILTIAZEM (CARDIZEM CD) 120 MG 24 HR CAPSULE    Take 120 mg by mouth daily.    DOCUSATE SODIUM (COLACE) 100 MG CAPSULE    Take 1 capsule (100 mg total) by mouth 2 (two) times daily.   FUROSEMIDE (LASIX) 20 MG TABLET    Take 10 mg by mouth every other day.    LEVOTHYROXINE (SYNTHROID, LEVOTHROID) 50 MCG TABLET    Take 50 mcg by mouth every morning.    LISINOPRIL (PRINIVIL,ZESTRIL) 40 MG  TABLET    Take 20 mg by mouth every morning.    MELATONIN 3 MG TBDP    Take 3 mg by mouth at bedtime.   MULTIPLE VITAMIN (MULTIVITAMIN WITH MINERALS) TABS TABLET    Take 1 tablet by mouth every morning.   OMEPRAZOLE (PRILOSEC) 20 MG CAPSULE    Take 20 mg by mouth daily.   POLYETHYLENE GLYCOL (MIRALAX / GLYCOLAX) PACKET    Take 17 g by mouth daily as needed.    SERTRALINE (ZOLOFT) 50 MG TABLET    Take 50 mg by mouth at bedtime.   TIOTROPIUM (SPIRIVA) 18 MCG INHALATION CAPSULE    Place 18 mcg into inhaler and inhale daily.  Modified Medications   No medications on file  Discontinued Medications   No medications on file     Review of Systems  Constitutional: Negative for fever, chills and diaphoresis.  HENT: Positive for hearing loss. Negative for congestion, ear discharge, ear pain, nosebleeds, sore throat and tinnitus.   Eyes: Negative for photophobia, pain, discharge and redness.  Respiratory: Negative for cough, shortness of breath, wheezing and stridor.   Cardiovascular: Positive for leg swelling. Negative for chest pain and palpitations.  Trace in ankles.   Gastrointestinal: Negative for nausea, vomiting, abdominal pain, diarrhea, constipation and blood in stool.       Lower abd incisional hernia.   Endocrine: Negative for polydipsia.  Genitourinary: Positive for frequency. Negative for dysuria, urgency, hematuria and flank pain.  Musculoskeletal: Positive for back pain. Negative for myalgias and neck pain.       Improved back pain. She is able to ambulates with walker on unit.  Resolution of Left hip pain since 11/25/14.  Skin: Negative for rash.       Lower abd incisional hernia-reducible.  Allergic/Immunologic: Negative for environmental allergies.  Neurological: Negative for dizziness, tremors, seizures, weakness and headaches.  Hematological: Does not bruise/bleed easily.  Psychiatric/Behavioral: Negative for suicidal ideas and hallucinations. The patient is not  nervous/anxious.     Filed Vitals:   12/03/14 1431  BP: 132/64  Pulse: 78  Temp: 98 F (36.7 C)  Resp: 20  Height: '5\' 2"'  (1.575 m)  Weight: 122 lb (55.339 kg)  SpO2: 94%   Body mass index is 22.31 kg/(m^2).  Physical Exam  Constitutional: She is oriented to person, place, and time. She appears well-developed and well-nourished. No distress.  HENT:  Head: Normocephalic and atraumatic.  Right Ear: External ear normal.  Left Ear: External ear normal.  Nose: Nose normal.  Mouth/Throat: Oropharynx is clear and moist. No oropharyngeal exudate.  Eyes: Conjunctivae and EOM are normal. Pupils are equal, round, and reactive to light. Right eye exhibits no discharge. Left eye exhibits no discharge. No scleral icterus.  Neck: Normal range of motion. Neck supple. No JVD present. No tracheal deviation present. No thyromegaly present.  Cardiovascular: Normal rate, regular rhythm, normal heart sounds and intact distal pulses.   No murmur heard. Pulmonary/Chest: Effort normal. No stridor. No respiratory distress. She has decreased breath sounds. She has no wheezes. She has rales. She exhibits no tenderness.  bibasilar  Abdominal: Soft. She exhibits no distension and no mass. There is no tenderness. There is no rebound and no guarding.  Musculoskeletal: Normal range of motion. She exhibits edema and tenderness.  Ankles and feet have trace edema. Mid back pain.  Unstable gait. Uses rolling walker.  Lymphadenopathy:    She has no cervical adenopathy.  Neurological: She is alert and oriented to person, place, and time. No cranial nerve deficit. She exhibits normal muscle tone. Coordination normal.  Left hemiparesis  Skin: Skin is warm and dry. No rash noted. She is not diaphoretic. No erythema. No pallor.  Mid abd surgical scar-healed-lower abd reducible incisional hernia-the patient has concerns of cosmetic reason but denied pain.  Left mastectomy Scalp skin lesion above the left ear.     Psychiatric: She has a normal mood and affect. Her behavior is normal. Judgment and thought content normal.  Repetitive.      Labs reviewed: Lab Summary Latest Ref Rng 07/23/2014 07/18/2014 01/15/2014 12/20/2013 12/19/2013 12/18/2013  Hemoglobin 12.0 - 16.0 g/dL 16.1(A) 10.9(A) 14.9 15.2(H) 15.5(H) 17.2(H)  Hematocrit 36 - 46 % 48(A) 33(A) 45 45.9 47.6(H) 51.4(H)  White count - 5.2 30.5 5.3 6.1 8.3 6.5  Platelet count 150 - 399 K/L 367 98(A) 440(A) 419(H) 427(H) 450(H)  Sodium 137 - 147 mmol/L (None) 140 139 (None) 141 144  Potassium 3.4 - 5.3 mmol/L (None) 4.4 4.8 (None) 3.7 3.5(L)  Calcium 8.4 - 10.5 mg/dL (None) (None) (None) (None) 9.2 10.1  Phosphorus - (None) (None) (None) (None) (None) (None)  Creatinine 0.5 - 1.1 mg/dL (None) 0.6 0.6 (None)  0.74 0.56  AST 13 - 35 U/L (None) 20 18 (None) (None) 20  Alk Phos 25 - 125 U/L (None) 84 89 (None) (None) 102  Bilirubin 0.3 - 1.2 mg/dL (None) (None) (None) (None) (None) 0.5  Glucose - (None) 86 95 (None) 122(H) 93  Cholesterol - (None) (None) (None) (None) (None) (None)  HDL cholesterol - (None) (None) (None) (None) (None) (None)  Triglycerides - (None) (None) (None) (None) (None) (None)  LDL Direct - (None) (None) (None) (None) (None) (None)  LDL Calc - (None) (None) (None) (None) (None) (None)  Total protein 6.0 - 8.3 g/dL (None) (None) (None) (None) (None) 7.5  Albumin 3.5 - 5.2 g/dL (None) (None) (None) (None) (None) 4.4   Lab Results  Component Value Date   TSH 2.88 07/18/2014   Lab Results  Component Value Date   BUN 30* 07/18/2014   No results found for: HGBA1C     Assessment/Plan  1. Pain in left hip Resolved as of today's exam  2. Midline low back pain with sciatica, sciatica laterality unspecified Chronic discomfort with history of intermittent levels of pain and radiation of pain down the left leg  3. Essential hypertension Controlled. Patient tolerated change to losartan and I will leave her on this  medication. -BMP, future  4. Gastroesophageal reflux disease without esophagitis Controlled with Pepcid  5. Cough According to the patient with cough is about the same as last month. Is nonproductive.  6. Hypothyroidism, unspecified hypothyroidism type -TSH, future

## 2014-12-06 DIAGNOSIS — E039 Hypothyroidism, unspecified: Secondary | ICD-10-CM | POA: Diagnosis not present

## 2014-12-06 DIAGNOSIS — E870 Hyperosmolality and hypernatremia: Secondary | ICD-10-CM | POA: Diagnosis not present

## 2014-12-06 LAB — BASIC METABOLIC PANEL
BUN: 25 mg/dL — AB (ref 4–21)
CREATININE: 0.6 mg/dL (ref 0.5–1.1)
GLUCOSE: 70 mg/dL
Potassium: 4.6 mmol/L (ref 3.4–5.3)
SODIUM: 140 mmol/L (ref 137–147)

## 2014-12-06 LAB — CBC AND DIFFERENTIAL
HCT: 43 % (ref 36–46)
Hemoglobin: 15 g/dL (ref 12.0–16.0)
Platelets: 252 10*3/uL (ref 150–399)
WBC: 5.3 10^3/mL

## 2014-12-06 LAB — TSH: TSH: 2.6 u[IU]/mL (ref 0.41–5.90)

## 2014-12-07 ENCOUNTER — Other Ambulatory Visit: Payer: Self-pay | Admitting: Nurse Practitioner

## 2014-12-07 DIAGNOSIS — E039 Hypothyroidism, unspecified: Secondary | ICD-10-CM

## 2014-12-14 ENCOUNTER — Non-Acute Institutional Stay (SKILLED_NURSING_FACILITY): Payer: Medicare Other | Admitting: Nurse Practitioner

## 2014-12-14 ENCOUNTER — Encounter: Payer: Self-pay | Admitting: Nurse Practitioner

## 2014-12-14 DIAGNOSIS — K219 Gastro-esophageal reflux disease without esophagitis: Secondary | ICD-10-CM | POA: Diagnosis not present

## 2014-12-14 DIAGNOSIS — J42 Unspecified chronic bronchitis: Secondary | ICD-10-CM

## 2014-12-14 DIAGNOSIS — F329 Major depressive disorder, single episode, unspecified: Secondary | ICD-10-CM

## 2014-12-14 DIAGNOSIS — I10 Essential (primary) hypertension: Secondary | ICD-10-CM

## 2014-12-14 DIAGNOSIS — M546 Pain in thoracic spine: Secondary | ICD-10-CM

## 2014-12-14 DIAGNOSIS — R609 Edema, unspecified: Secondary | ICD-10-CM

## 2014-12-14 DIAGNOSIS — F32A Depression, unspecified: Secondary | ICD-10-CM

## 2014-12-14 DIAGNOSIS — E039 Hypothyroidism, unspecified: Secondary | ICD-10-CM | POA: Diagnosis not present

## 2014-12-14 DIAGNOSIS — R05 Cough: Secondary | ICD-10-CM | POA: Diagnosis not present

## 2014-12-14 DIAGNOSIS — K5900 Constipation, unspecified: Secondary | ICD-10-CM | POA: Diagnosis not present

## 2014-12-14 NOTE — Assessment & Plan Note (Signed)
0/16/15 T 10 Dr. Newman Pies: PCP managing pain.  01/12/14 schedule Norco q am -- pain is reasonably controlled. Ambulates with walker on unit. Continue Norco for pain

## 2014-12-14 NOTE — Assessment & Plan Note (Signed)
Stable, no change in tx.

## 2014-12-14 NOTE — Assessment & Plan Note (Signed)
Ankles/feet, chronic, continue Furosemide 10mg  qod. No significant weight change.

## 2014-12-14 NOTE — Assessment & Plan Note (Signed)
mood is stabilized, continue Zoloft 75mg  daily,

## 2014-12-14 NOTE — Assessment & Plan Note (Signed)
Controlled, continue Losartan 50mg  daily, monitor Bp

## 2014-12-14 NOTE — Assessment & Plan Note (Signed)
CXR to r/o PNA, Augmentin 875mg  q12h po x 7days.

## 2014-12-14 NOTE — Progress Notes (Signed)
Patient ID: Dana George, female   DOB: [birth year], 81 y.o.   MRN: [id number]  Location:  SNF [facility] Provider:  Marlana Latus NP  Code Status:  DNR Goals of care: Advanced Directive information    Chief Complaint  Patient presents with  . Medical Management of Chronic Issues  . Acute Visit    congestive cough, constipation     HPI: Patient is a 81 y.o. female seen in the SNF at [facility] today for evaluation of congestive cough, denied chest pain or sputum production, no O2 desaturation, she is afebrile. She c/o constipation.   Review of Systems:  Review of Systems  Constitutional: Negative for fever, chills and diaphoresis.  HENT: Positive for hearing loss. Negative for congestion, ear discharge and ear pain.   Eyes: Negative for pain, discharge and redness.  Respiratory: Negative for cough, shortness of breath, wheezing and stridor.   Cardiovascular: Positive for leg swelling. Negative for chest pain and palpitations.       Trace in ankles.   Gastrointestinal: Positive for constipation. Negative for nausea, vomiting, abdominal pain and diarrhea.       Lower abd incisional hernia.   Genitourinary: Positive for frequency. Negative for dysuria, urgency, hematuria and flank pain.  Musculoskeletal: Positive for back pain. Negative for myalgias and neck pain.       Improved back pain. She is able to ambulates with walker on unit.  Resolution of Left hip pain since 11/25/14.  Skin: Negative for rash.       Lower abd incisional hernia-reducible.  Neurological: Negative for dizziness, tremors, seizures, weakness and headaches.  Psychiatric/Behavioral: Positive for memory loss. Negative for suicidal ideas and hallucinations. The patient is not nervous/anxious.     Past Medical History  Diagnosis Date  . Hypothyroidism   . Hypertension   . Shortness of breath   . COPD (chronic obstructive pulmonary disease)   . Osteoporosis   . Cancer 1980    Breast cancer  . TIA (transient  ischemic attack)   . Incisional hernia, without obstruction or gangrene 05/27/2014    Lower abd from previous surgery-2-3 small incisional hernias which are reducible-no incarceration or pain complained.    . Insomnia 11/18/2012  . Thoracic spine fracture 12/18/2013    01/05/14 T 10 Dr. Newman Pies: PCP managing pain.  F/u 2 months(suggested that if the pain is "not too bad" and seems to be improving that she "live with it" and give a change to heal without intervention.  12/18/13 X-ray Thoracic spine: Compression deformity at the level of T10. Comparing back to a chest x-ray from 12/09/2013, this appears new in the interval.       . Back pain, thoracic 01/12/2014  . Left hemiparesis 06/08/2014  . CVA (cerebral infarction) 06/08/2011    Following abdominal surgery for bowel obstruction   . Granuloma annulare 06/08/2014  . Constipation 11/18/2012  . Depression 12/26/2013  . DVT (deep venous thrombosis) 11/18/2012  . Fall 02/07/2013  . GERD (gastroesophageal reflux disease) 03/30/2014  . HTN (hypertension) 11/18/2012    01/15/14 Bun/creat 14/0.60   . Ventral hernia 06/08/2014    Patient Active Problem List   Diagnosis Date Noted  . Pain in left hip 12/03/2014  . Cough 12/03/2014  . Lower back pain 11/30/2014  . Pain of left upper arm 07/06/2014  . Left hemiparesis 06/08/2014  . Granuloma annulare 06/08/2014  . Ventral hernia 06/08/2014  . Incisional hernia, without obstruction or gangrene 05/27/2014  . Conjunctivitis 05/15/2014  .  GERD (gastroesophageal reflux disease) 03/30/2014  . Back pain, thoracic 01/12/2014  . Depression 12/26/2013  . Thoracic spine fracture 12/18/2013  . Fall 02/07/2013  . Chest pain 02/07/2013  . Edema 11/25/2012  . TIA (transient ischemic attack) 11/18/2012  . HTN (hypertension) 11/18/2012  . COPD (chronic obstructive pulmonary disease) 11/18/2012  . Hx of breast cancer 11/18/2012  . DVT (deep venous thrombosis) 11/18/2012  . Insomnia 11/18/2012  .  Constipation 11/18/2012  . Hypothyroidism 11/18/2012  . CVA (cerebral infarction) 10/25/2012    No Known Allergies  Medications: Patient's Medications  New Prescriptions   No medications on file  Previous Medications   ACETAMINOPHEN (TYLENOL) 325 MG TABLET    Take 650 mg by mouth every 4 (four) hours as needed for moderate pain.   ASPIRIN 81 MG CHEWABLE TABLET    Chew 81 mg by mouth every morning.   CARVEDILOL (COREG) 12.5 MG TABLET    Take 12.5 mg by mouth 2 (two) times daily with a meal.   CLONIDINE (CATAPRES) 0.1 MG TABLET    Take 0.1 mg by mouth every morning.    DILTIAZEM (CARDIZEM CD) 120 MG 24 HR CAPSULE    Take 120 mg by mouth daily.    DOCUSATE SODIUM (COLACE) 100 MG CAPSULE    Take 1 capsule (100 mg total) by mouth 2 (two) times daily.   FUROSEMIDE (LASIX) 20 MG TABLET    Take 10 mg by mouth every other day.    LEVOTHYROXINE (SYNTHROID, LEVOTHROID) 50 MCG TABLET    Take 50 mcg by mouth every morning.    LISINOPRIL (PRINIVIL,ZESTRIL) 40 MG TABLET    Take 20 mg by mouth every morning.    MELATONIN 3 MG TBDP    Take 3 mg by mouth at bedtime.   MULTIPLE VITAMIN (MULTIVITAMIN WITH MINERALS) TABS TABLET    Take 1 tablet by mouth every morning.   OMEPRAZOLE (PRILOSEC) 20 MG CAPSULE    Take 20 mg by mouth daily.   POLYETHYLENE GLYCOL (MIRALAX / GLYCOLAX) PACKET    Take 17 g by mouth daily as needed.    SERTRALINE (ZOLOFT) 50 MG TABLET    Take 50 mg by mouth at bedtime.   TIOTROPIUM (SPIRIVA) 18 MCG INHALATION CAPSULE    Place 18 mcg into inhaler and inhale daily.  Modified Medications   No medications on file  Discontinued Medications   No medications on file    Physical Exam: Filed Vitals:   12/14/14 1442  BP: 130/82  Pulse: 78  Temp: 98 F (36.7 C)  TempSrc: Tympanic  Resp: 20   There is no weight on file to calculate BMI.  Physical Exam  Constitutional: She is oriented to person, place, and time. She appears well-developed and well-nourished. No distress.  HENT:    Head: Normocephalic and atraumatic.  Right Ear: External ear normal.  Left Ear: External ear normal.  Nose: Nose normal.  Mouth/Throat: Oropharynx is clear and moist. No oropharyngeal exudate.  Eyes: Conjunctivae and EOM are normal. Pupils are equal, round, and reactive to light. Right eye exhibits no discharge. Left eye exhibits no discharge. No scleral icterus.  Neck: Normal range of motion. Neck supple. No JVD present. No tracheal deviation present. No thyromegaly present.  Cardiovascular: Normal rate, regular rhythm, normal heart sounds and intact distal pulses.   No murmur heard. Pulmonary/Chest: Effort normal. No stridor. No respiratory distress. She has decreased breath sounds. She has no wheezes. She has rales. She exhibits no tenderness.  bibasilar  Abdominal: Soft. She exhibits no distension and no mass. There is no tenderness.  Musculoskeletal: Normal range of motion. She exhibits edema and tenderness.  Ankles and feet have trace edema. Mid back pain.  Unstable gait. Uses rolling walker.  Neurological: She is alert and oriented to person, place, and time. No cranial nerve deficit. She exhibits normal muscle tone. Coordination normal.  Left hemiparesis  Skin: Skin is warm and dry. No rash noted. She is not diaphoretic. No erythema. No pallor.  Mid abd surgical scar-healed-lower abd reducible incisional hernia-the patient has concerns of cosmetic reason but denied pain.  Left mastectomy Scalp skin lesion above the left ear.   Psychiatric: She has a normal mood and affect. Her behavior is normal. Judgment and thought content normal.  Repetitive.     Labs reviewed: Basic Metabolic Panel:  Recent Labs  12/18/13 1621 12/19/13 0404 01/15/14 07/18/14 12/06/14  NA 144 141 139 140 140  K 3.5* 3.7 4.8 4.4 4.6  CL 100 102  --   --   --   CO2 26 27  --   --   --   GLUCOSE 93 122*  --   --   --   BUN 13 21 14  30* 25*  CREATININE 0.56 0.74 0.6 0.6 0.6  CALCIUM 10.1 9.2  --   --    --     Liver Function Tests:  Recent Labs  12/18/13 1621 01/15/14 07/18/14  AST 20 18 20   ALT 16 17 15   ALKPHOS 102 89 84  BILITOT 0.5  --   --   PROT 7.5  --   --   ALBUMIN 4.4  --   --     CBC:  Recent Labs  12/18/13 1621 12/19/13 0404 12/20/13 0402  07/18/14 07/23/14 12/06/14  WBC 6.5 8.3 6.1  < > 30.5 5.2 5.3  NEUTROABS 4.6  --   --   --   --   --   --   HGB 17.2* 15.5* 15.2*  < > 10.9* 16.1* 15.0  HCT 51.4* 47.6* 45.9  < > 33* 48* 43  MCV 94.8 96.4 96.6  --   --   --   --   PLT 450* 427* 419*  < > 98* 367 252  < > = values in this interval not displayed.  Lab Results  Component Value Date   TSH 2.60 12/06/2014   No results found for: HGBA1C Lab Results  Component Value Date   CHOL 121 11/22/2012   HDL 40 11/22/2012   LDLCALC 65 11/22/2012   TRIG 81 11/22/2012    Significant Diagnostic Results since last visit: none  Patient Care Team: Estill Dooms, MD as PCP - General (Internal Medicine) Gaynelle Arabian, MD as Consulting Physician (Orthopedic Surgery) Man Mast X, NP as Nurse Practitioner (Nurse Practitioner) [place] ([facility] and [place])  Assessment/Plan Problem List Items Addressed This Visit    Hypothyroidism (Chronic)    Continue Levothyroxine 53mcg daily, TSH 2.599 12/06/14      HTN (hypertension) (Chronic)    Controlled, continue Losartan 50mg  daily, monitor Bp      GERD (gastroesophageal reflux disease) (Chronic)    Stable, no change in tx.       Edema    Ankles/feet, chronic, continue Furosemide 10mg  qod. No significant weight change.      Depression    mood is stabilized, continue Zoloft 75mg  daily,       COPD (chronic obstructive pulmonary  disease) - Primary    CXR to r/o PNA, Augmentin 875mg  q12h po x 7days.       Constipation    Controlled. Increase MiraLax bid      Back pain, thoracic    0/16/15 T 10 Dr. Newman Pies: PCP managing pain.  01/12/14 schedule Norco q am -- pain is  reasonably controlled. Ambulates with walker on unit. Continue Norco for pain          Family/ staff Communication: monitor for cough, constipation, continue SNF for care needs.   Labs/tests ordered: CXR  [facility] Mast NP [facility] [address], [place] [address] On Call:  [phone] & follow prompts after 5pm & weekends Office Phone:  [phone] Office Fax:  [phone]

## 2014-12-14 NOTE — Assessment & Plan Note (Signed)
Continue Levothyroxine 32mcg daily, TSH 2.599 12/06/14

## 2014-12-14 NOTE — Assessment & Plan Note (Signed)
Controlled. Increase MiraLax bid

## 2015-01-04 DIAGNOSIS — Z961 Presence of intraocular lens: Secondary | ICD-10-CM | POA: Diagnosis not present

## 2015-01-04 DIAGNOSIS — H04123 Dry eye syndrome of bilateral lacrimal glands: Secondary | ICD-10-CM | POA: Diagnosis not present

## 2015-01-18 ENCOUNTER — Encounter: Payer: Self-pay | Admitting: Nurse Practitioner

## 2015-01-18 ENCOUNTER — Non-Acute Institutional Stay (SKILLED_NURSING_FACILITY): Payer: Medicare Other | Admitting: Nurse Practitioner

## 2015-01-18 DIAGNOSIS — K219 Gastro-esophageal reflux disease without esophagitis: Secondary | ICD-10-CM

## 2015-01-18 DIAGNOSIS — F329 Major depressive disorder, single episode, unspecified: Secondary | ICD-10-CM | POA: Diagnosis not present

## 2015-01-18 DIAGNOSIS — K5900 Constipation, unspecified: Secondary | ICD-10-CM | POA: Diagnosis not present

## 2015-01-18 DIAGNOSIS — F32A Depression, unspecified: Secondary | ICD-10-CM

## 2015-01-18 DIAGNOSIS — M546 Pain in thoracic spine: Secondary | ICD-10-CM

## 2015-01-18 DIAGNOSIS — I10 Essential (primary) hypertension: Secondary | ICD-10-CM

## 2015-01-18 DIAGNOSIS — J410 Simple chronic bronchitis: Secondary | ICD-10-CM

## 2015-01-18 DIAGNOSIS — R05 Cough: Secondary | ICD-10-CM | POA: Diagnosis not present

## 2015-01-18 DIAGNOSIS — R059 Cough, unspecified: Secondary | ICD-10-CM

## 2015-01-18 DIAGNOSIS — R609 Edema, unspecified: Secondary | ICD-10-CM | POA: Diagnosis not present

## 2015-01-18 DIAGNOSIS — E039 Hypothyroidism, unspecified: Secondary | ICD-10-CM

## 2015-01-18 NOTE — Assessment & Plan Note (Signed)
Stable, no change in tx.

## 2015-01-18 NOTE — Assessment & Plan Note (Signed)
Resolved after 7 day course of Augmentin

## 2015-01-18 NOTE — Assessment & Plan Note (Signed)
Controlled, continue Losartan 50mg  daily, monitor Bp

## 2015-01-18 NOTE — Assessment & Plan Note (Signed)
Controlled. Continue MiraLax bid 

## 2015-01-18 NOTE — Progress Notes (Signed)
Patient ID: Dana George, female   DOB: [birth year], 81 y.o.   MRN: [id number]  Location:  SNF [facility] Provider:  Marlana Latus NP  Code Status:  DNR Goals of care: Advanced Directive information    Chief Complaint  Patient presents with  . Medical Management of Chronic Issues     HPI: Patient is a 81 y.o. female seen in the SNF at [facility] today for evaluation of c/o sleeps too much, but denied progression of memory change, her mood is stable.   Review of Systems  Constitutional: Negative for fever, chills and diaphoresis.  HENT: Positive for hearing loss. Negative for congestion, ear discharge and ear pain.   Eyes: Negative for pain, discharge and redness.  Respiratory: Negative for cough, shortness of breath, wheezing and stridor.   Cardiovascular: Positive for leg swelling. Negative for chest pain and palpitations.       Trace in ankles.   Gastrointestinal: Negative for nausea, vomiting, abdominal pain, diarrhea and constipation.       Lower abd incisional hernia.   Genitourinary: Positive for frequency. Negative for dysuria, urgency, hematuria and flank pain.  Musculoskeletal: Positive for back pain. Negative for myalgias and neck pain.       Improved back pain. She is able to ambulates with walker on unit.  Resolution of Left hip pain since 11/25/14.  Skin: Negative for rash.       Lower abd incisional hernia-reducible.  Neurological: Negative for dizziness, tremors, seizures, weakness and headaches.  Psychiatric/Behavioral: Positive for memory loss. Negative for suicidal ideas and hallucinations. The patient is not nervous/anxious.        C/o sleeps too much.    Past Medical History  Diagnosis Date  . Hypothyroidism   . Hypertension   . Shortness of breath   . COPD (chronic obstructive pulmonary disease) ([place])   . Osteoporosis   . Cancer ([place]) 1980    Breast cancer  . TIA (transient ischemic attack)   . Incisional hernia, without obstruction or gangrene  05/27/2014    Lower abd from previous surgery-2-3 small incisional hernias which are reducible-no incarceration or pain complained.    . Insomnia 11/18/2012  . Thoracic spine fracture [facility]) 12/18/2013    01/05/14 T 10 Dr. Newman Pies: PCP managing pain.  F/u 2 months(suggested that if the pain is "not too bad" and seems to be improving that she "live with it" and give a change to heal without intervention.  12/18/13 X-ray Thoracic spine: Compression deformity at the level of T10. Comparing back to a chest x-ray from 12/09/2013, this appears new in the interval.       . Back pain, thoracic 01/12/2014  . Left hemiparesis ([place]) 06/08/2014  . CVA (cerebral infarction) 06/08/2011    Following abdominal surgery for bowel obstruction   . Granuloma annulare 06/08/2014  . Constipation 11/18/2012  . Depression 12/26/2013  . DVT (deep venous thrombosis) ([place]) 11/18/2012  . Fall 02/07/2013  . GERD (gastroesophageal reflux disease) 03/30/2014  . HTN (hypertension) 11/18/2012    01/15/14 Bun/creat 14/0.60   . Ventral hernia 06/08/2014    Patient Active Problem List   Diagnosis Date Noted  . Pain in left hip 12/03/2014  . Cough 12/03/2014  . Lower back pain 11/30/2014  . Pain of left upper arm 07/06/2014  . Left hemiparesis ([place]) 06/08/2014  . Granuloma annulare 06/08/2014  . Ventral hernia 06/08/2014  . Incisional hernia, without obstruction or gangrene 05/27/2014  . Conjunctivitis 05/15/2014  . GERD (gastroesophageal  reflux disease) 03/30/2014  . Back pain, thoracic 01/12/2014  . Depression 12/26/2013  . Thoracic spine fracture ([place]) 12/18/2013  . Fall 02/07/2013  . Chest pain 02/07/2013  . Edema 11/25/2012  . TIA (transient ischemic attack) 11/18/2012  . HTN (hypertension) 11/18/2012  . COPD (chronic obstructive pulmonary disease) ([place]) 11/18/2012  . Hx of breast cancer 11/18/2012  . DVT (deep venous thrombosis) ([place]) 11/18/2012  . Insomnia 11/18/2012  . Constipation 11/18/2012  .  Hypothyroidism 11/18/2012  . CVA (cerebral infarction) 10/25/2012    No Known Allergies  Medications: Patient's Medications  New Prescriptions   No medications on file  Previous Medications   ACETAMINOPHEN (TYLENOL) 325 MG TABLET    Take 650 mg by mouth every 4 (four) hours as needed for moderate pain.   ASPIRIN 81 MG CHEWABLE TABLET    Chew 81 mg by mouth every morning.   CARVEDILOL (COREG) 12.5 MG TABLET    Take 12.5 mg by mouth 2 (two) times daily with a meal.   CLONIDINE (CATAPRES) 0.1 MG TABLET    Take 0.1 mg by mouth every morning.    DILTIAZEM (CARDIZEM CD) 120 MG 24 HR CAPSULE    Take 120 mg by mouth daily.    DOCUSATE SODIUM (COLACE) 100 MG CAPSULE    Take 1 capsule (100 mg total) by mouth 2 (two) times daily.   FUROSEMIDE (LASIX) 20 MG TABLET    Take 10 mg by mouth every other day.    LEVOTHYROXINE (SYNTHROID, LEVOTHROID) 50 MCG TABLET    Take 50 mcg by mouth every morning.    LISINOPRIL (PRINIVIL,ZESTRIL) 40 MG TABLET    Take 20 mg by mouth every morning.    MELATONIN 3 MG TBDP    Take 3 mg by mouth at bedtime.   MULTIPLE VITAMIN (MULTIVITAMIN WITH MINERALS) TABS TABLET    Take 1 tablet by mouth every morning.   OMEPRAZOLE (PRILOSEC) 20 MG CAPSULE    Take 20 mg by mouth daily.   POLYETHYLENE GLYCOL (MIRALAX / GLYCOLAX) PACKET    Take 17 g by mouth daily as needed.    SERTRALINE (ZOLOFT) 50 MG TABLET    Take 50 mg by mouth at bedtime.   TIOTROPIUM (SPIRIVA) 18 MCG INHALATION CAPSULE    Place 18 mcg into inhaler and inhale daily.  Modified Medications   No medications on file  Discontinued Medications   No medications on file    Physical Exam: Filed Vitals:   01/18/15 1212  BP: 122/68  Pulse: 64  Temp: 97.4 F (36.3 C)  TempSrc: Tympanic  Resp: 18   There is no weight on file to calculate BMI.  Physical Exam  Constitutional: She is oriented to person, place, and time. She appears well-developed and well-nourished. No distress.  HENT:  Head: Normocephalic and  atraumatic.  Right Ear: External ear normal.  Left Ear: External ear normal.  Nose: Nose normal.  Mouth/Throat: Oropharynx is clear and moist. No oropharyngeal exudate.  Eyes: Conjunctivae and EOM are normal. Pupils are equal, round, and reactive to light. Right eye exhibits no discharge. Left eye exhibits no discharge. No scleral icterus.  Neck: Normal range of motion. Neck supple. No JVD present. No tracheal deviation present. No thyromegaly present.  Cardiovascular: Normal rate, regular rhythm, normal heart sounds and intact distal pulses.   No murmur heard. Pulmonary/Chest: Effort normal. No stridor. No respiratory distress. She has decreased breath sounds. She has no wheezes. She has rales. She exhibits no tenderness.  bibasilar  Abdominal: Soft. She exhibits no distension and no mass. There is no tenderness.  Musculoskeletal: Normal range of motion. She exhibits edema and tenderness.  Ankles and feet have trace edema. Mid back pain.  Unstable gait. Uses rolling walker.  Neurological: She is alert and oriented to person, place, and time. No cranial nerve deficit. She exhibits normal muscle tone. Coordination normal.  Left hemiparesis  Skin: Skin is warm and dry. No rash noted. She is not diaphoretic. No erythema. No pallor.  Mid abd surgical scar-healed-lower abd reducible incisional hernia-the patient has concerns of cosmetic reason but denied pain.  Left mastectomy Scalp skin lesion above the left ear.   Psychiatric: She has a normal mood and affect. Her behavior is normal. Judgment and thought content normal.  Repetitive.     Labs reviewed: Basic Metabolic Panel:  Recent Labs  07/18/14 12/06/14  NA 140 140  K 4.4 4.6  BUN 30* 25*  CREATININE 0.6 0.6    Liver Function Tests:  Recent Labs  07/18/14  AST 20  ALT 15  ALKPHOS 84    CBC:  Recent Labs  07/18/14 07/23/14 12/06/14  WBC 30.5 5.2 5.3  HGB 10.9* 16.1* 15.0  HCT 33* 48* 43  PLT 98* 367 252    Lab  Results  Component Value Date   TSH 2.60 12/06/2014   No results found for: HGBA1C Lab Results  Component Value Date   CHOL 121 11/22/2012   HDL 40 11/22/2012   LDLCALC 65 11/22/2012   TRIG 81 11/22/2012    Significant Diagnostic Results since last visit: none  Patient Care Team: Estill Dooms, MD as PCP - General (Internal Medicine) Gaynelle Arabian, MD as Consulting Physician (Orthopedic Surgery) Jessina Marse X, NP as Nurse Practitioner (Nurse Practitioner) [place] ([facility] and [place])  Assessment/Plan Problem List Items Addressed This Visit    Back pain, thoracic - Primary    0/16/15 T 10 Dr. Newman Pies: PCP managing pain.  01/12/14 schedule Norco q am -- pain is reasonably controlled. Ambulates with walker on unit. Continue Norco for pain -- c/o sleeps too much, but she is much less of aches and pains in multiple sites. Continue to observe .      Constipation    Controlled. Continue MiraLax bid      COPD (chronic obstructive pulmonary disease) ([place])    Resolved, CXR 12/14/14 ruled out PNA, Augmentin 875mg  q12h po x 7days.       Cough (Chronic)    Resolved after 7 day course of Augmentin      Depression    mood is stabilized, continue Zoloft 75mg  daily,       Edema    Ankles/feet, chronic, continue Furosemide 10mg  qod. No significant weight change.      GERD (gastroesophageal reflux disease) (Chronic)    Stable, no change in tx.       HTN (hypertension) (Chronic)    Controlled, continue Losartan 50mg  daily, monitor Bp      Hypothyroidism (Chronic)    Continue Levothyroxine 62mcg daily, TSH 2.599 12/06/14          Family/ staff Communication: continue SNF for care needs.   Labs/tests ordered: none  ManXie Chalice Philbert NP [facility] [address], [place] [address] On Call:  [phone] & follow prompts after 5pm & weekends Office Phone:  [phone] Office Fax:   [phone]

## 2015-01-18 NOTE — Assessment & Plan Note (Signed)
Resolved, CXR 12/14/14 ruled out PNA, Augmentin 875mg  q12h po x 7days.

## 2015-01-18 NOTE — Assessment & Plan Note (Signed)
mood is stabilized, continue Zoloft 75mg  daily,

## 2015-01-18 NOTE — Assessment & Plan Note (Signed)
0/16/15 T 10 Dr. Newman Pies: PCP managing pain.  01/12/14 schedule Norco q am -- pain is reasonably controlled. Ambulates with walker on unit. Continue Norco for pain -- c/o sleeps too much, but she is much less of aches and pains in multiple sites. Continue to observe .

## 2015-01-18 NOTE — Assessment & Plan Note (Signed)
Continue Levothyroxine 60mcg daily, TSH 2.599 12/06/14

## 2015-01-18 NOTE — Assessment & Plan Note (Signed)
Ankles/feet, chronic, continue Furosemide 10mg  qod. No significant weight change.

## 2015-01-29 ENCOUNTER — Encounter: Payer: Self-pay | Admitting: Nurse Practitioner

## 2015-01-29 ENCOUNTER — Non-Acute Institutional Stay (SKILLED_NURSING_FACILITY): Payer: Medicare Other | Admitting: Nurse Practitioner

## 2015-01-29 DIAGNOSIS — K219 Gastro-esophageal reflux disease without esophagitis: Secondary | ICD-10-CM | POA: Diagnosis not present

## 2015-01-29 DIAGNOSIS — K5900 Constipation, unspecified: Secondary | ICD-10-CM

## 2015-01-29 DIAGNOSIS — J410 Simple chronic bronchitis: Secondary | ICD-10-CM

## 2015-01-29 DIAGNOSIS — E039 Hypothyroidism, unspecified: Secondary | ICD-10-CM | POA: Diagnosis not present

## 2015-01-29 DIAGNOSIS — I10 Essential (primary) hypertension: Secondary | ICD-10-CM | POA: Diagnosis not present

## 2015-01-29 DIAGNOSIS — F329 Major depressive disorder, single episode, unspecified: Secondary | ICD-10-CM

## 2015-01-29 DIAGNOSIS — F32A Depression, unspecified: Secondary | ICD-10-CM

## 2015-01-29 NOTE — Assessment & Plan Note (Signed)
Controlled, continue Clonidine 0.25mf, Carvedilol 12.5mg  bid, Diltiazem 120mg , Losartan 50mg  daily, monitor Bp

## 2015-01-29 NOTE — Assessment & Plan Note (Signed)
Controlled. Continue MiraLax bid 

## 2015-01-29 NOTE — Assessment & Plan Note (Signed)
Stable, continue Famotidine 20mg daily.   

## 2015-01-29 NOTE — Assessment & Plan Note (Signed)
mood is stabilized, continue Zoloft 75mg  daily,

## 2015-01-29 NOTE — Assessment & Plan Note (Signed)
Continue Levothyroxine 78mcg daily, TSH 2.599 12/06/14

## 2015-01-29 NOTE — Progress Notes (Signed)
Patient ID: Dana George, female   DOB: [birth year], 81 y.o.   MRN: [id number]  Location:  SNF [facility] Provider:  Marlana Latus NP  Code Status:  DNR Goals of care: Advanced Directive information    Chief Complaint  Patient presents with  . Medical Management of Chronic Issues     HPI: Patient is a 81 y.o. female seen in the SNF at [facility] today for evaluation of Hx of hypothyroidism, takes Levothyroxine 10mcg, last TSH 2.6 12/06/14. Blood pressure has been controlled while on Clonidine 0.1mg , Diltiazem 120mg , Losartan 50mg , Carvedilol 12.5mg  bid, arthrotic pain is managed with Norco bid, asymptomatic of GERD or constipation.   Review of Systems  Constitutional: Negative for fever, chills and diaphoresis.  HENT: Positive for hearing loss. Negative for congestion, ear discharge and ear pain.   Eyes: Negative for pain, discharge and redness.  Respiratory: Negative for cough, shortness of breath, wheezing and stridor.   Cardiovascular: Positive for leg swelling. Negative for chest pain and palpitations.       Trace in ankles.   Gastrointestinal: Negative for nausea, vomiting, abdominal pain, diarrhea and constipation.       Lower abd incisional hernia.   Genitourinary: Positive for frequency. Negative for dysuria, urgency, hematuria and flank pain.  Musculoskeletal: Positive for back pain. Negative for myalgias and neck pain.       Improved back pain. She is able to ambulates with walker on unit.  Resolution of Left hip pain since 11/25/14.  Skin: Negative for rash.       Lower abd incisional hernia-reducible.  Neurological: Negative for dizziness, tremors, seizures, weakness and headaches.  Psychiatric/Behavioral: Positive for memory loss. Negative for suicidal ideas and hallucinations. The patient is not nervous/anxious.        C/o sleeps too much.    Past Medical History  Diagnosis Date  . Hypothyroidism   . Hypertension   . Shortness of breath   . COPD (chronic  obstructive pulmonary disease) ([place])   . Osteoporosis   . Cancer ([place]) 1980    Breast cancer  . TIA (transient ischemic attack)   . Incisional hernia, without obstruction or gangrene 05/27/2014    Lower abd from previous surgery-2-3 small incisional hernias which are reducible-no incarceration or pain complained.    . Insomnia 11/18/2012  . Thoracic spine fracture [facility]) 12/18/2013    01/05/14 T 10 Dr. Newman Pies: PCP managing pain.  F/u 2 months(suggested that if the pain is "not too bad" and seems to be improving that she "live with it" and give a change to heal without intervention.  12/18/13 X-ray Thoracic spine: Compression deformity at the level of T10. Comparing back to a chest x-ray from 12/09/2013, this appears new in the interval.       . Back pain, thoracic 01/12/2014  . Left hemiparesis ([place]) 06/08/2014  . CVA (cerebral infarction) 06/08/2011    Following abdominal surgery for bowel obstruction   . Granuloma annulare 06/08/2014  . Constipation 11/18/2012  . Depression 12/26/2013  . DVT (deep venous thrombosis) ([place]) 11/18/2012  . Fall 02/07/2013  . GERD (gastroesophageal reflux disease) 03/30/2014  . HTN (hypertension) 11/18/2012    01/15/14 Bun/creat 14/0.60   . Ventral hernia 06/08/2014    Patient Active Problem List   Diagnosis Date Noted  . Pain in left hip 12/03/2014  . Cough 12/03/2014  . Lower back pain 11/30/2014  . Pain of left upper arm 07/06/2014  . Left hemiparesis ([place]) 06/08/2014  . Granuloma  annulare 06/08/2014  . Ventral hernia 06/08/2014  . Incisional hernia, without obstruction or gangrene 05/27/2014  . Conjunctivitis 05/15/2014  . GERD (gastroesophageal reflux disease) 03/30/2014  . Back pain, thoracic 01/12/2014  . Depression 12/26/2013  . Thoracic spine fracture ([place]) 12/18/2013  . Fall 02/07/2013  . Chest pain 02/07/2013  . Edema 11/25/2012  . TIA (transient ischemic attack) 11/18/2012  . HTN (hypertension) 11/18/2012  . COPD (chronic obstructive  pulmonary disease) ([place]) 11/18/2012  . Hx of breast cancer 11/18/2012  . DVT (deep venous thrombosis) ([place]) 11/18/2012  . Insomnia 11/18/2012  . Constipation 11/18/2012  . Hypothyroidism 11/18/2012  . CVA (cerebral infarction) 10/25/2012    No Known Allergies  Medications: Patient's Medications  New Prescriptions   No medications on file  Previous Medications   ACETAMINOPHEN (TYLENOL) 325 MG TABLET    Take 650 mg by mouth every 4 (four) hours as needed for moderate pain.   ASPIRIN 81 MG CHEWABLE TABLET    Chew 81 mg by mouth every morning.   CARVEDILOL (COREG) 12.5 MG TABLET    Take 12.5 mg by mouth 2 (two) times daily with a meal.   CLONIDINE (CATAPRES) 0.1 MG TABLET    Take 0.1 mg by mouth every morning.    DILTIAZEM (CARDIZEM CD) 120 MG 24 HR CAPSULE    Take 120 mg by mouth daily.    DOCUSATE SODIUM (COLACE) 100 MG CAPSULE    Take 1 capsule (100 mg total) by mouth 2 (two) times daily.   FUROSEMIDE (LASIX) 20 MG TABLET    Take 10 mg by mouth every other day.    LEVOTHYROXINE (SYNTHROID, LEVOTHROID) 50 MCG TABLET    Take 50 mcg by mouth every morning.    LISINOPRIL (PRINIVIL,ZESTRIL) 40 MG TABLET    Take 20 mg by mouth every morning.    MELATONIN 3 MG TBDP    Take 3 mg by mouth at bedtime.   MULTIPLE VITAMIN (MULTIVITAMIN WITH MINERALS) TABS TABLET    Take 1 tablet by mouth every morning.   OMEPRAZOLE (PRILOSEC) 20 MG CAPSULE    Take 20 mg by mouth daily.   POLYETHYLENE GLYCOL (MIRALAX / GLYCOLAX) PACKET    Take 17 g by mouth daily as needed.    SERTRALINE (ZOLOFT) 50 MG TABLET    Take 50 mg by mouth at bedtime.   TIOTROPIUM (SPIRIVA) 18 MCG INHALATION CAPSULE    Place 18 mcg into inhaler and inhale daily.  Modified Medications   No medications on file  Discontinued Medications   No medications on file    Physical Exam: Filed Vitals:   01/29/15 1504  BP: 110/50  Pulse: 62  Temp: 97.3 F (36.3 C)  TempSrc: Tympanic  Resp: 18   There is no weight on file to calculate  BMI.  Physical Exam  Constitutional: She is oriented to person, place, and time. She appears well-developed and well-nourished. No distress.  HENT:  Head: Normocephalic and atraumatic.  Right Ear: External ear normal.  Left Ear: External ear normal.  Nose: Nose normal.  Mouth/Throat: Oropharynx is clear and moist. No oropharyngeal exudate.  Eyes: Conjunctivae and EOM are normal. Pupils are equal, round, and reactive to light. Right eye exhibits no discharge. Left eye exhibits no discharge. No scleral icterus.  Neck: Normal range of motion. Neck supple. No JVD present. No tracheal deviation present. No thyromegaly present.  Cardiovascular: Normal rate, regular rhythm, normal heart sounds and intact distal pulses.   No murmur heard. Pulmonary/Chest: Effort normal.  No stridor. No respiratory distress. She has decreased breath sounds. She has no wheezes. She has rales. She exhibits no tenderness.  bibasilar  Abdominal: Soft. She exhibits no distension and no mass. There is no tenderness.  Musculoskeletal: Normal range of motion. She exhibits edema and tenderness.  Ankles and feet have trace edema. Mid back pain.  Unstable gait. Uses rolling walker.  Neurological: She is alert and oriented to person, place, and time. No cranial nerve deficit. She exhibits normal muscle tone. Coordination normal.  Left hemiparesis  Skin: Skin is warm and dry. No rash noted. She is not diaphoretic. No erythema. No pallor.  Mid abd surgical scar-healed-lower abd reducible incisional hernia-the patient has concerns of cosmetic reason but denied pain.  Left mastectomy Scalp skin lesion above the left ear.   Psychiatric: She has a normal mood and affect. Her behavior is normal. Judgment and thought content normal.  Repetitive.     Labs reviewed: Basic Metabolic Panel:  Recent Labs  07/18/14 12/06/14  NA 140 140  K 4.4 4.6  BUN 30* 25*  CREATININE 0.6 0.6    Liver Function Tests:  Recent Labs  07/18/14   AST 20  ALT 15  ALKPHOS 84    CBC:  Recent Labs  07/18/14 07/23/14 12/06/14  WBC 30.5 5.2 5.3  HGB 10.9* 16.1* 15.0  HCT 33* 48* 43  PLT 98* 367 252    Lab Results  Component Value Date   TSH 2.60 12/06/2014   No results found for: HGBA1C Lab Results  Component Value Date   CHOL 121 11/22/2012   HDL 40 11/22/2012   LDLCALC 65 11/22/2012   TRIG 81 11/22/2012    Significant Diagnostic Results since last visit: none  Patient Care Team: Estill Dooms, MD as PCP - General (Internal Medicine) Gaynelle Arabian, MD as Consulting Physician (Orthopedic Surgery) Kyomi Hector X, NP as Nurse Practitioner (Nurse Practitioner) [place] ([place] and [place])  Assessment/Plan Problem List Items Addressed This Visit    None       Family/ staff Communication: continue SNF for care needs.   Labs/tests ordered: none  ManXie Osualdo Hansell NP [facility] [address], [place] [address] On Call:  [phone] & follow prompts after 5pm & weekends Office Phone:  [phone] Office Fax:  [phone]

## 2015-01-29 NOTE — Assessment & Plan Note (Addendum)
Resolved, CXR 12/14/14 ruled out PNA, last treated with  7 day course of Augmentin 875mg  q12h Continue Spiriva daily, prn Ventolin 2 puffs q6,

## 2015-02-01 ENCOUNTER — Non-Acute Institutional Stay (SKILLED_NURSING_FACILITY): Payer: Medicare Other | Admitting: Nurse Practitioner

## 2015-02-01 ENCOUNTER — Encounter: Payer: Self-pay | Admitting: Nurse Practitioner

## 2015-02-01 DIAGNOSIS — F32A Depression, unspecified: Secondary | ICD-10-CM

## 2015-02-01 DIAGNOSIS — J440 Chronic obstructive pulmonary disease with acute lower respiratory infection: Secondary | ICD-10-CM | POA: Diagnosis not present

## 2015-02-01 DIAGNOSIS — K921 Melena: Secondary | ICD-10-CM | POA: Diagnosis not present

## 2015-02-01 DIAGNOSIS — K5900 Constipation, unspecified: Secondary | ICD-10-CM

## 2015-02-01 DIAGNOSIS — I10 Essential (primary) hypertension: Secondary | ICD-10-CM | POA: Diagnosis not present

## 2015-02-01 DIAGNOSIS — E039 Hypothyroidism, unspecified: Secondary | ICD-10-CM | POA: Diagnosis not present

## 2015-02-01 DIAGNOSIS — M546 Pain in thoracic spine: Secondary | ICD-10-CM

## 2015-02-01 DIAGNOSIS — K219 Gastro-esophageal reflux disease without esophagitis: Secondary | ICD-10-CM | POA: Diagnosis not present

## 2015-02-01 DIAGNOSIS — F329 Major depressive disorder, single episode, unspecified: Secondary | ICD-10-CM

## 2015-02-01 NOTE — Assessment & Plan Note (Signed)
mood is stabilized, continue Zoloft 75mg daily,  

## 2015-02-01 NOTE — Assessment & Plan Note (Signed)
Stable, continue Famotidine 20mg daily.   

## 2015-02-01 NOTE — Assessment & Plan Note (Signed)
Resolved, CXR 12/14/14 ruled out PNA, last treated with  7 day course of Augmentin 875mg q12h Continue Spiriva daily, prn Ventolin 2 puffs q6,  

## 2015-02-01 NOTE — Assessment & Plan Note (Signed)
C/o black stools, denied abd pain, nausea, vomiting, or diarrhea, will update CBC, CMP, Guaiac stools x3.

## 2015-02-01 NOTE — Progress Notes (Signed)
Patient ID: Dana George, female   DOB: 09-02-1923, 81 y.o.   MRN: [id number]  Location:  SNF [facility] Provider:  Marlana Latus NP  Code Status:  DNR Goals of care: Advanced Directive information    Chief Complaint  Patient presents with  . Medical Management of Chronic Issues  . Acute Visit    c/o black stools     HPI: Patient is a 81 y.o. female seen in the SNF at [facility] today for evaluation of c/o having black stools, denied abd pain, nausea, constipation, or diarrhea. Hx of hypothyroidism, takes Levothyroxine 53mcg, last TSH 2.6 12/06/14. Blood pressure has been controlled while on Clonidine 0.1mg , Diltiazem 120mg , Losartan 50mg , Carvedilol 12.5mg  bid, arthrotic pain is managed with Norco bid, asymptomatic of GERD or constipation.   Review of Systems  Constitutional: Negative for fever, chills and diaphoresis.  HENT: Positive for hearing loss. Negative for congestion, ear discharge and ear pain.   Eyes: Negative for pain, discharge and redness.  Respiratory: Negative for cough, shortness of breath, wheezing and stridor.   Cardiovascular: Positive for leg swelling. Negative for chest pain and palpitations.       Trace in ankles.   Gastrointestinal: Negative for nausea, vomiting, abdominal pain, diarrhea and constipation.       Lower abd incisional hernia.  C/o black stools.   Genitourinary: Positive for frequency. Negative for dysuria, urgency, hematuria and flank pain.  Musculoskeletal: Positive for back pain. Negative for myalgias and neck pain.       Improved back pain. She is able to ambulates with walker on unit.  Resolution of Left hip pain since 11/25/14.  Skin: Negative for rash.       Lower abd incisional hernia-reducible.  Neurological: Negative for dizziness, tremors, seizures, weakness and headaches.  Psychiatric/Behavioral: Positive for memory loss. Negative for suicidal ideas and hallucinations. The patient is not nervous/anxious.        C/o sleeps too much.      Past Medical History  Diagnosis Date  . Hypothyroidism   . Hypertension   . Shortness of breath   . COPD (chronic obstructive pulmonary disease) ([place])   . Osteoporosis   . Cancer ([place]) 1980    Breast cancer  . TIA (transient ischemic attack)   . Incisional hernia, without obstruction or gangrene 05/27/2014    Lower abd from previous surgery-2-3 small incisional hernias which are reducible-no incarceration or pain complained.    . Insomnia 11/18/2012  . Thoracic spine fracture [facility]) 12/18/2013    01/05/14 T 10 Dr. Newman Pies: PCP managing pain.  F/u 2 months(suggested that if the pain is "not too bad" and seems to be improving that she "live with it" and give a change to heal without intervention.  12/18/13 X-ray Thoracic spine: Compression deformity at the level of T10. Comparing back to a chest x-ray from 12/09/2013, this appears new in the interval.       . Back pain, thoracic 01/12/2014  . Left hemiparesis ([place]) 06/08/2014  . CVA (cerebral infarction) 06/08/2011    Following abdominal surgery for bowel obstruction   . Granuloma annulare 06/08/2014  . Constipation 11/18/2012  . Depression 12/26/2013  . DVT (deep venous thrombosis) ([place]) 11/18/2012  . Fall 02/07/2013  . GERD (gastroesophageal reflux disease) 03/30/2014  . HTN (hypertension) 11/18/2012    01/15/14 Bun/creat 14/0.60   . Ventral hernia 06/08/2014    Patient Active Problem List   Diagnosis Date Noted  . Black stools 02/01/2015  . Pain  in left hip 12/03/2014  . Cough 12/03/2014  . Lower back pain 11/30/2014  . Pain of left upper arm 07/06/2014  . Left hemiparesis ([place]) 06/08/2014  . Granuloma annulare 06/08/2014  . Ventral hernia 06/08/2014  . Incisional hernia, without obstruction or gangrene 05/27/2014  . Conjunctivitis 05/15/2014  . GERD (gastroesophageal reflux disease) 03/30/2014  . Back pain, thoracic 01/12/2014  . Depression 12/26/2013  . Thoracic spine fracture ([place]) 12/18/2013  . Fall 02/07/2013  .  Chest pain 02/07/2013  . Edema 11/25/2012  . TIA (transient ischemic attack) 11/18/2012  . HTN (hypertension) 11/18/2012  . COPD (chronic obstructive pulmonary disease) ([place]) 11/18/2012  . Hx of breast cancer 11/18/2012  . DVT (deep venous thrombosis) ([place]) 11/18/2012  . Insomnia 11/18/2012  . Constipation 11/18/2012  . Hypothyroidism 11/18/2012  . CVA (cerebral infarction) 10/25/2012    No Known Allergies  Medications: Patient's Medications  New Prescriptions   No medications on file  Previous Medications   ACETAMINOPHEN (TYLENOL) 325 MG TABLET    Take 650 mg by mouth every 4 (four) hours as needed for moderate pain.   ASPIRIN 81 MG CHEWABLE TABLET    Chew 81 mg by mouth every morning.   CARVEDILOL (COREG) 12.5 MG TABLET    Take 12.5 mg by mouth 2 (two) times daily with a meal.   CLONIDINE (CATAPRES) 0.1 MG TABLET    Take 0.1 mg by mouth every morning.    DILTIAZEM (CARDIZEM CD) 120 MG 24 HR CAPSULE    Take 120 mg by mouth daily.    DOCUSATE SODIUM (COLACE) 100 MG CAPSULE    Take 1 capsule (100 mg total) by mouth 2 (two) times daily.   FUROSEMIDE (LASIX) 20 MG TABLET    Take 10 mg by mouth every other day.    LEVOTHYROXINE (SYNTHROID, LEVOTHROID) 50 MCG TABLET    Take 50 mcg by mouth every morning.    LISINOPRIL (PRINIVIL,ZESTRIL) 40 MG TABLET    Take 20 mg by mouth every morning.    MELATONIN 3 MG TBDP    Take 3 mg by mouth at bedtime.   MULTIPLE VITAMIN (MULTIVITAMIN WITH MINERALS) TABS TABLET    Take 1 tablet by mouth every morning.   OMEPRAZOLE (PRILOSEC) 20 MG CAPSULE    Take 20 mg by mouth daily.   POLYETHYLENE GLYCOL (MIRALAX / GLYCOLAX) PACKET    Take 17 g by mouth daily as needed.    SERTRALINE (ZOLOFT) 50 MG TABLET    Take 50 mg by mouth at bedtime.   TIOTROPIUM (SPIRIVA) 18 MCG INHALATION CAPSULE    Place 18 mcg into inhaler and inhale daily.  Modified Medications   No medications on file  Discontinued Medications   No medications on file    Physical Exam: Filed  Vitals:   02/01/15 1447  BP: 130/64  Pulse: 75  Temp: 98.2 F (36.8 C)  TempSrc: Tympanic  Resp: 16   There is no weight on file to calculate BMI.  Physical Exam  Constitutional: She is oriented to person, place, and time. She appears well-developed and well-nourished. No distress.  HENT:  Head: Normocephalic and atraumatic.  Right Ear: External ear normal.  Left Ear: External ear normal.  Nose: Nose normal.  Mouth/Throat: Oropharynx is clear and moist. No oropharyngeal exudate.  Eyes: Conjunctivae and EOM are normal. Pupils are equal, round, and reactive to light. Right eye exhibits no discharge. Left eye exhibits no discharge. No scleral icterus.  Neck: Normal range of motion. Neck supple.  No JVD present. No tracheal deviation present. No thyromegaly present.  Cardiovascular: Normal rate, regular rhythm, normal heart sounds and intact distal pulses.   No murmur heard. Pulmonary/Chest: Effort normal. No stridor. No respiratory distress. She has decreased breath sounds. She has no wheezes. She has rales. She exhibits no tenderness.  bibasilar  Abdominal: Soft. She exhibits no distension and no mass. There is no tenderness.  Musculoskeletal: Normal range of motion. She exhibits edema and tenderness.  Ankles and feet have trace edema. Mid back pain.  Unstable gait. Uses rolling walker.  Neurological: She is alert and oriented to person, place, and time. No cranial nerve deficit. She exhibits normal muscle tone. Coordination normal.  Left hemiparesis  Skin: Skin is warm and dry. No rash noted. She is not diaphoretic. No erythema. No pallor.  Mid abd surgical scar-healed-lower abd reducible incisional hernia-the patient has concerns of cosmetic reason but denied pain.  Left mastectomy Scalp skin lesion above the left ear.   Psychiatric: She has a normal mood and affect. Her behavior is normal. Judgment and thought content normal.  Repetitive.     Labs reviewed: Basic Metabolic  Panel:  Recent Labs  07/18/14 12/06/14  NA 140 140  K 4.4 4.6  BUN 30* 25*  CREATININE 0.6 0.6    Liver Function Tests:  Recent Labs  07/18/14  AST 20  ALT 15  ALKPHOS 84    CBC:  Recent Labs  07/18/14 07/23/14 12/06/14  WBC 30.5 5.2 5.3  HGB 10.9* 16.1* 15.0  HCT 33* 48* 43  PLT 98* 367 252    Lab Results  Component Value Date   TSH 2.60 12/06/2014   No results found for: HGBA1C Lab Results  Component Value Date   CHOL 121 11/22/2012   HDL 40 11/22/2012   LDLCALC 65 11/22/2012   TRIG 81 11/22/2012    Significant Diagnostic Results since last visit: none  Patient Care Team: Estill Dooms, MD as PCP - General (Internal Medicine) Gaynelle Arabian, MD as Consulting Physician (Orthopedic Surgery) Kalley Nicholl X, NP as Nurse Practitioner (Nurse Practitioner) [place] [facility] and [place])  Assessment/Plan Problem List Items Addressed This Visit    HTN (hypertension) - Primary (Chronic)    Controlled, continue Clonidine 0.6mf, Carvedilol 12.5mg  bid, Diltiazem 120mg , Losartan 50mg  daily, monitor Bp      Hypothyroidism (Chronic)    Continue Levothyroxine 16mcg daily, TSH 2.599 12/06/14, update TSH      GERD (gastroesophageal reflux disease) (Chronic)    Stable, continue Famotidine 20mg  daily.       COPD (chronic obstructive pulmonary disease) ([place])    Resolved, CXR 12/14/14 ruled out PNA, last treated with  7 day course of Augmentin 875mg  q12h Continue Spiriva daily, prn Ventolin 2 puffs q6,       Constipation    Controlled. Continue MiraLax bid      Depression    mood is stabilized, continue Zoloft 75mg  daily,       Back pain, thoracic    Pain is better controlled, continue Norco.       Black stools    C/o black stools, denied abd pain, nausea, vomiting, or diarrhea, will update CBC, CMP, Guaiac stools x3.           Family/ staff Communication: continue SNF for care needs.   Labs/tests ordered: CBC, CMP,  TSH, Guaiac stools x3  ManXie Yancey Pedley NP Geriatrics [facility] [address],  [place] [address] On Call:  [phone] & follow prompts after 5pm & weekends Office Phone:  [phone] Office Fax:  [phone]

## 2015-02-01 NOTE — Assessment & Plan Note (Signed)
Pain is better controlled, continue Norco.

## 2015-02-01 NOTE — Assessment & Plan Note (Signed)
Controlled. Continue MiraLax bid 

## 2015-02-01 NOTE — Assessment & Plan Note (Signed)
Controlled, continue Clonidine 0.1mf, Carvedilol 12.5mg bid, Diltiazem 120mg, Losartan 50mg daily, monitor Bp 

## 2015-02-01 NOTE — Assessment & Plan Note (Signed)
Continue Levothyroxine 33mcg daily, TSH 2.599 12/06/14, update TSH

## 2015-02-04 DIAGNOSIS — E039 Hypothyroidism, unspecified: Secondary | ICD-10-CM | POA: Diagnosis not present

## 2015-02-04 DIAGNOSIS — E870 Hyperosmolality and hypernatremia: Secondary | ICD-10-CM | POA: Diagnosis not present

## 2015-02-04 LAB — CBC AND DIFFERENTIAL
HCT: 46 % (ref 36–46)
Hemoglobin: 14.8 g/dL (ref 12.0–16.0)
PLATELETS: 321 10*3/uL (ref 150–399)

## 2015-02-04 LAB — HEPATIC FUNCTION PANEL
ALT: 10 U/L (ref 7–35)
AST: 16 U/L (ref 13–35)
Alkaline Phosphatase: 89 U/L (ref 25–125)
Bilirubin, Total: 0.5 mg/dL

## 2015-02-04 LAB — TSH: TSH: 2.3 u[IU]/mL (ref 0.41–5.90)

## 2015-02-04 LAB — BASIC METABOLIC PANEL
BUN: 20 mg/dL (ref 4–21)
CREATININE: 0.6 mg/dL (ref 0.5–1.1)
GLUCOSE: 84 mg/dL
POTASSIUM: 4.9 mmol/L (ref 3.4–5.3)
Sodium: 140 mmol/L (ref 137–147)

## 2015-02-05 ENCOUNTER — Other Ambulatory Visit: Payer: Self-pay | Admitting: Nurse Practitioner

## 2015-02-05 DIAGNOSIS — E039 Hypothyroidism, unspecified: Secondary | ICD-10-CM

## 2015-02-25 ENCOUNTER — Non-Acute Institutional Stay (SKILLED_NURSING_FACILITY): Payer: Medicare Other | Admitting: Internal Medicine

## 2015-02-25 ENCOUNTER — Encounter: Payer: Self-pay | Admitting: Internal Medicine

## 2015-02-25 DIAGNOSIS — I10 Essential (primary) hypertension: Secondary | ICD-10-CM | POA: Diagnosis not present

## 2015-02-25 DIAGNOSIS — E039 Hypothyroidism, unspecified: Secondary | ICD-10-CM | POA: Diagnosis not present

## 2015-02-25 DIAGNOSIS — F329 Major depressive disorder, single episode, unspecified: Secondary | ICD-10-CM

## 2015-02-25 DIAGNOSIS — K432 Incisional hernia without obstruction or gangrene: Secondary | ICD-10-CM

## 2015-02-25 DIAGNOSIS — F32A Depression, unspecified: Secondary | ICD-10-CM

## 2015-02-25 DIAGNOSIS — M5440 Lumbago with sciatica, unspecified side: Secondary | ICD-10-CM

## 2015-02-25 NOTE — Progress Notes (Signed)
Patient ID: Dana George, female   DOB: 04-27-1923, 81 y.o.   MRN: [id number]    [facility] Room Number: N 10  Place of Service: SNF (31)     No Known Allergies  Chief Complaint  Patient presents with  . Medical Management of Chronic Issues    HPI:  Patient is basically unchanged since I last saw her with many of the same chronic complaints.  Essential hypertension - controlled  Midline low back pain with sciatica, sciatica laterality unspecified -  Back and left leg when she walks greater than 60 feet. Chronic low back and left-sided discomfort with walking  Hypothyroidism, unspecified hypothyroidism type - compensated   Depression -continues to have somewhat of a negative outlook. She doesn't feel that she is making satisfactory progress in regards to walking comfortably.    Incisional hernia, without obstruction or gangrene - She continues to worry about the appearance of this hernia. She denies pain. She has been told in the past she is not a good surgical candidate for repair of this rather large ventral hernia.     Medications: Patient's Medications  New Prescriptions   No medications on file  Previous Medications   ACETAMINOPHEN (TYLENOL) 325 MG TABLET    Take 650 mg by mouth every 4 (four) hours as needed for moderate pain.   ASPIRIN 81 MG CHEWABLE TABLET    Chew 81 mg by mouth every morning.   CARVEDILOL (COREG) 12.5 MG TABLET    Take 12.5 mg by mouth 2 (two) times daily with a meal.   CLONIDINE (CATAPRES) 0.1 MG TABLET    Take 0.1 mg by mouth every morning.    DILTIAZEM (CARDIZEM CD) 120 MG 24 HR CAPSULE    Take 120 mg by mouth daily.    DOCUSATE SODIUM (COLACE) 100 MG CAPSULE    Take 1 capsule (100 mg total) by mouth 2 (two) times daily.   FUROSEMIDE (LASIX) 20 MG TABLET    Take 10 mg by mouth every other day.    LEVOTHYROXINE (SYNTHROID, LEVOTHROID) 50 MCG TABLET    Take 50 mcg by mouth every morning.    LISINOPRIL  (PRINIVIL,ZESTRIL) 40 MG TABLET    Take 20 mg by mouth every morning.    MELATONIN 3 MG TBDP    Take 3 mg by mouth at bedtime.   MULTIPLE VITAMIN (MULTIVITAMIN WITH MINERALS) TABS TABLET    Take 1 tablet by mouth every morning.   OMEPRAZOLE (PRILOSEC) 20 MG CAPSULE    Take 20 mg by mouth daily.   POLYETHYLENE GLYCOL (MIRALAX / GLYCOLAX) PACKET    Take 17 g by mouth daily as needed.    SERTRALINE (ZOLOFT) 50 MG TABLET    Take 50 mg by mouth at bedtime.   TIOTROPIUM (SPIRIVA) 18 MCG INHALATION CAPSULE    Place 18 mcg into inhaler and inhale daily.  Modified Medications   No medications on file  Discontinued Medications   No medications on file     Review of Systems  Constitutional: Negative for fever, chills and diaphoresis.  HENT: Positive for hearing loss. Negative for congestion, ear discharge, ear pain, nosebleeds, sore throat and tinnitus.   Eyes: Negative for photophobia, pain, discharge and redness.  Respiratory: Negative for cough, shortness of breath, wheezing and stridor.   Cardiovascular: Positive for leg swelling. Negative for chest pain and palpitations.       Trace in ankles.   Gastrointestinal: Negative for nausea, vomiting, abdominal pain,  diarrhea, constipation and blood in stool.       Lower abd incisional hernia.   Endocrine: Negative for polydipsia.  Genitourinary: Positive for frequency. Negative for dysuria, urgency, hematuria and flank pain.  Musculoskeletal: Positive for back pain. Negative for myalgias and neck pain.       Improved back pain. She is able to ambulates with walker on unit.  Resolution of Left hip pain since 11/25/14.  Skin: Negative for rash.       Lower abd incisional hernia-reducible.  Allergic/Immunologic: Negative for environmental allergies.  Neurological: Negative for dizziness, tremors, seizures, weakness and headaches.  Hematological: Does not bruise/bleed easily.  Psychiatric/Behavioral: Negative for suicidal ideas and hallucinations. The  patient is not nervous/anxious.     Filed Vitals:   02/25/15 1619  BP: 122/68  Pulse: 61  Temp: 97.8 F (36.6 C)  Resp: 18  Height: '5\' 2"'  (1.575 m)  Weight: 120 lb (54.432 kg)  SpO2: 98%   Body mass index is 21.94 kg/(m^2).  Physical Exam  Constitutional: She is oriented to person, place, and time. She appears well-developed and well-nourished. No distress.  HENT:  Head: Normocephalic and atraumatic.  Right Ear: External ear normal.  Left Ear: External ear normal.  Nose: Nose normal.  Mouth/Throat: Oropharynx is clear and moist. No oropharyngeal exudate.  Eyes: Conjunctivae and EOM are normal. Pupils are equal, round, and reactive to light. Right eye exhibits no discharge. Left eye exhibits no discharge. No scleral icterus.  Neck: Normal range of motion. Neck supple. No JVD present. No tracheal deviation present. No thyromegaly present.  Cardiovascular: Normal rate, regular rhythm, normal heart sounds and intact distal pulses.   No murmur heard. Pulmonary/Chest: Effort normal. No stridor. No respiratory distress. She has decreased breath sounds. She has no wheezes. She has rales. She exhibits no tenderness.  bibasilar  Abdominal: Soft. She exhibits no distension and no mass. There is no tenderness.  Musculoskeletal: Normal range of motion. She exhibits edema and tenderness.  Ankles and feet have trace edema. Mid back pain.  Unstable gait. Uses rolling walker.  Neurological: She is alert and oriented to person, place, and time. No cranial nerve deficit. She exhibits normal muscle tone. Coordination normal.  Left hemiparesis  Skin: Skin is warm and dry. No rash noted. She is not diaphoretic. No erythema. No pallor.  Mid abd surgical scar-healed-lower abd reducible incisional hernia-the patient has concerns of cosmetic reason but denied pain.  Left mastectomy Scalp skin lesion above the left ear.   Psychiatric: She has a normal mood and affect. Her behavior is normal. Judgment and  thought content normal.  Repetitive.      Labs reviewed: Lab Summary Latest Ref Rng 02/04/2015 12/06/2014 07/23/2014 07/18/2014  Hemoglobin 12.0 - 16.0 g/dL 14.8 15.0 16.1(A) 10.9(A)  Hematocrit 36 - 46 % 46 43 48(A) 33(A)  White count - (None) 5.3 5.2 30.5  Platelet count 150 - 399 K/L 321 252 367 98(A)  Sodium 137 - 147 mmol/L 140 140 (None) 140  Potassium 3.4 - 5.3 mmol/L 4.9 4.6 (None) 4.4  Calcium - (None) (None) (None) (None)  Phosphorus - (None) (None) (None) (None)  Creatinine 0.5 - 1.1 mg/dL 0.6 0.6 (None) 0.6  AST 13 - 35 U/L 16 (None) (None) 20  Alk Phos 25 - 125 U/L 89 (None) (None) 84  Bilirubin - (None) (None) (None) (None)  Glucose - 84 70 (None) 86  Cholesterol - (None) (None) (None) (None)  HDL cholesterol - (None) (None) (None) (None)  Triglycerides - (None) (None) (None) (None)  LDL Direct - (None) (None) (None) (None)  LDL Calc - (None) (None) (None) (None)  Total protein - (None) (None) (None) (None)  Albumin - (None) (None) (None) (None)   Lab Results  Component Value Date   TSH 2.30 02/04/2015   Lab Results  Component Value Date   BUN 20 02/04/2015   No results found for: HGBA1C     Assessment/Plan  1. Essential hypertension Controlled   2. Midline low back pain with sciatica, sciatica laterality unspecified Stable  3. Hypothyroidism, unspecified hypothyroidism type Compensated   4. Depression Change sertraline to Cymbalta 30 mg daily  5. Incisional hernia, without obstruction or gangrene I advised the patient that she is not a surgical candidate and that she should not have anything done about this hernia unless it becomes painful. Surgical procedures would be formidable for this elderly lady.

## 2015-03-01 ENCOUNTER — Non-Acute Institutional Stay (SKILLED_NURSING_FACILITY): Payer: Medicare Other | Admitting: Nurse Practitioner

## 2015-03-01 ENCOUNTER — Encounter: Payer: Self-pay | Admitting: Nurse Practitioner

## 2015-03-01 DIAGNOSIS — M546 Pain in thoracic spine: Secondary | ICD-10-CM

## 2015-03-01 DIAGNOSIS — R609 Edema, unspecified: Secondary | ICD-10-CM | POA: Diagnosis not present

## 2015-03-01 DIAGNOSIS — E039 Hypothyroidism, unspecified: Secondary | ICD-10-CM

## 2015-03-01 DIAGNOSIS — K219 Gastro-esophageal reflux disease without esophagitis: Secondary | ICD-10-CM

## 2015-03-01 DIAGNOSIS — I10 Essential (primary) hypertension: Secondary | ICD-10-CM | POA: Diagnosis not present

## 2015-03-01 DIAGNOSIS — J410 Simple chronic bronchitis: Secondary | ICD-10-CM | POA: Diagnosis not present

## 2015-03-01 DIAGNOSIS — F329 Major depressive disorder, single episode, unspecified: Secondary | ICD-10-CM

## 2015-03-01 DIAGNOSIS — K5900 Constipation, unspecified: Secondary | ICD-10-CM

## 2015-03-01 DIAGNOSIS — F32A Depression, unspecified: Secondary | ICD-10-CM

## 2015-03-01 NOTE — Assessment & Plan Note (Signed)
Controlled, continue Clonidine 0.1mf, Carvedilol 12.5mg bid, Diltiazem 120mg, Losartan 50mg daily, monitor Bp 

## 2015-03-01 NOTE — Assessment & Plan Note (Signed)
Resolved, CXR 12/14/14 ruled out PNA, last treated with  7 day course of Augmentin 875mg q12h Continue Spiriva daily, prn Ventolin 2 puffs q6,  

## 2015-03-01 NOTE — Assessment & Plan Note (Signed)
Continue Levothyroxine 16mcg daily, TSH 2.599 12/06/14, 2.295 02/04/15

## 2015-03-01 NOTE — Progress Notes (Signed)
Patient ID: Dana George, female   DOB: 1923-12-27, 81 y.o.   MRN: [id number]  Location:  SNF [facility] Provider:  Marlana Latus NP  Code Status:  DNR Goals of care: Advanced Directive information    Chief Complaint  Patient presents with  . Medical Management of Chronic Issues     HPI: Patient is a 81 y.o. female seen in the SNF at [facility] today for evaluation of Hx of hypothyroidism, takes Levothyroxine 39mcg, last TSH 2.295 02/04/15. Blood pressure has been controlled while on Clonidine 0.1mg , Diltiazem 120mg , Losartan 50mg , Carvedilol 12.5mg  bid, arthrotic pain is controlled on Norco tid, but c/o drowsiness, asymptomatic of GERD or constipation.   Review of Systems  Constitutional: Negative for fever, chills and diaphoresis.  HENT: Positive for hearing loss. Negative for congestion, ear discharge and ear pain.   Eyes: Negative for pain, discharge and redness.  Respiratory: Negative for cough, shortness of breath, wheezing and stridor.   Cardiovascular: Positive for leg swelling. Negative for chest pain and palpitations.       Trace in ankles.   Gastrointestinal: Negative for nausea, vomiting, abdominal pain, diarrhea and constipation.       Lower abd incisional hernia.    Genitourinary: Positive for frequency. Negative for dysuria, urgency, hematuria and flank pain.  Musculoskeletal: Positive for back pain. Negative for myalgias and neck pain.       Improved back pain. She is able to ambulates with walker on unit.  Resolution of Left hip pain since 11/25/14.  Skin: Negative for rash.       Lower abd incisional hernia-reducible.  Neurological: Negative for dizziness, tremors, seizures, weakness and headaches.  Psychiatric/Behavioral: Positive for memory loss. Negative for suicidal ideas and hallucinations. The patient is not nervous/anxious.        C/o sleeps too much.    Past Medical History  Diagnosis Date  . Hypothyroidism   . Hypertension   . Shortness of breath     . COPD (chronic obstructive pulmonary disease) ([place])   . Osteoporosis   . Cancer ([place]) 1980    Breast cancer  . TIA (transient ischemic attack)   . Incisional hernia, without obstruction or gangrene 05/27/2014    Lower abd from previous surgery-2-3 small incisional hernias which are reducible-no incarceration or pain complained.    . Insomnia 11/18/2012  . Thoracic spine fracture [facility]) 12/18/2013    01/05/14 T 10 Dr. Newman Pies: PCP managing pain.  F/u 2 months(suggested that if the pain is "not too bad" and seems to be improving that she "live with it" and give a change to heal without intervention.  12/18/13 X-ray Thoracic spine: Compression deformity at the level of T10. Comparing back to a chest x-ray from 12/09/2013, this appears new in the interval.       . Back pain, thoracic 01/12/2014  . Left hemiparesis ([place]) 06/08/2014  . CVA (cerebral infarction) 06/08/2011    Following abdominal surgery for bowel obstruction   . Granuloma annulare 06/08/2014  . Constipation 11/18/2012  . Depression 12/26/2013  . DVT (deep venous thrombosis) ([place]) 11/18/2012  . Fall 02/07/2013  . GERD (gastroesophageal reflux disease) 03/30/2014  . HTN (hypertension) 11/18/2012    01/15/14 Bun/creat 14/0.60   . Ventral hernia 06/08/2014    Patient Active Problem List   Diagnosis Date Noted  . Pain in left hip 12/03/2014  . Cough 12/03/2014  . Lower back pain 11/30/2014  . Pain of left upper arm 07/06/2014  . Left hemiparesis (  [place]) 06/08/2014  . Granuloma annulare 06/08/2014  . Ventral hernia 06/08/2014  . Incisional hernia, without obstruction or gangrene 05/27/2014  . GERD (gastroesophageal reflux disease) 03/30/2014  . Back pain, thoracic 01/12/2014  . Depression 12/26/2013  . Thoracic spine fracture ([place]) 12/18/2013  . Fall 02/07/2013  . Chest pain 02/07/2013  . Edema 11/25/2012  . TIA (transient ischemic attack) 11/18/2012  . HTN (hypertension) 11/18/2012  . COPD (chronic obstructive pulmonary  disease) ([place]) 11/18/2012  . Hx of breast cancer 11/18/2012  . DVT (deep venous thrombosis) ([place]) 11/18/2012  . Insomnia 11/18/2012  . Constipation 11/18/2012  . Hypothyroidism 11/18/2012  . CVA (cerebral infarction) 10/25/2012    No Known Allergies  Medications: Patient's Medications  New Prescriptions   No medications on file  Previous Medications   ACETAMINOPHEN (TYLENOL) 325 MG TABLET    Take 650 mg by mouth every 4 (four) hours as needed for moderate pain.   ASPIRIN 81 MG CHEWABLE TABLET    Chew 81 mg by mouth every morning.   CARVEDILOL (COREG) 12.5 MG TABLET    Take 12.5 mg by mouth 2 (two) times daily with a meal.   CLONIDINE (CATAPRES) 0.1 MG TABLET    Take 0.1 mg by mouth every morning.    DILTIAZEM (CARDIZEM CD) 120 MG 24 HR CAPSULE    Take 120 mg by mouth daily.    DOCUSATE SODIUM (COLACE) 100 MG CAPSULE    Take 1 capsule (100 mg total) by mouth 2 (two) times daily.   FUROSEMIDE (LASIX) 20 MG TABLET    Take 10 mg by mouth every other day.    LEVOTHYROXINE (SYNTHROID, LEVOTHROID) 50 MCG TABLET    Take 50 mcg by mouth every morning.    LISINOPRIL (PRINIVIL,ZESTRIL) 40 MG TABLET    Take 20 mg by mouth every morning.    MELATONIN 3 MG TBDP    Take 3 mg by mouth at bedtime.   MULTIPLE VITAMIN (MULTIVITAMIN WITH MINERALS) TABS TABLET    Take 1 tablet by mouth every morning.   OMEPRAZOLE (PRILOSEC) 20 MG CAPSULE    Take 20 mg by mouth daily.   POLYETHYLENE GLYCOL (MIRALAX / GLYCOLAX) PACKET    Take 17 g by mouth daily as needed.    SERTRALINE (ZOLOFT) 50 MG TABLET    Take 50 mg by mouth at bedtime.   TIOTROPIUM (SPIRIVA) 18 MCG INHALATION CAPSULE    Place 18 mcg into inhaler and inhale daily.  Modified Medications   No medications on file  Discontinued Medications   No medications on file    Physical Exam: Filed Vitals:   03/01/15 1414  BP: 122/64  Pulse: 68  Temp: 97 F (36.1 C)  TempSrc: Tympanic  Resp: 18   There is no weight on file to calculate  BMI.  Physical Exam  Constitutional: She is oriented to person, place, and time. She appears well-developed and well-nourished. No distress.  HENT:  Head: Normocephalic and atraumatic.  Right Ear: External ear normal.  Left Ear: External ear normal.  Nose: Nose normal.  Mouth/Throat: Oropharynx is clear and moist. No oropharyngeal exudate.  Eyes: Conjunctivae and EOM are normal. Pupils are equal, round, and reactive to light. Right eye exhibits no discharge. Left eye exhibits no discharge. No scleral icterus.  Neck: Normal range of motion. Neck supple. No JVD present. No tracheal deviation present. No thyromegaly present.  Cardiovascular: Normal rate, regular rhythm, normal heart sounds and intact distal pulses.   No murmur heard. Pulmonary/Chest: Effort  normal. No stridor. No respiratory distress. She has decreased breath sounds. She has no wheezes. She has rales. She exhibits no tenderness.  bibasilar  Abdominal: Soft. She exhibits no distension and no mass. There is no tenderness.  Musculoskeletal: Normal range of motion. She exhibits edema and tenderness.  Ankles and feet have trace edema. Mid back pain.  Unstable gait. Uses rolling walker.  Neurological: She is alert and oriented to person, place, and time. No cranial nerve deficit. She exhibits normal muscle tone. Coordination normal.  Left hemiparesis  Skin: Skin is warm and dry. No rash noted. She is not diaphoretic. No erythema. No pallor.  Mid abd surgical scar-healed-lower abd reducible incisional hernia-the patient has concerns of cosmetic reason but denied pain.  Left mastectomy Scalp skin lesion above the left ear.   Psychiatric: She has a normal mood and affect. Her behavior is normal. Judgment and thought content normal.  Repetitive.     Labs reviewed: Basic Metabolic Panel:  Recent Labs  07/18/14 12/06/14 02/04/15  NA 140 140 140  K 4.4 4.6 4.9  BUN 30* 25* 20  CREATININE 0.6 0.6 0.6    Liver Function  Tests:  Recent Labs  07/18/14 02/04/15  AST 20 16  ALT 15 10  ALKPHOS 84 89    CBC:  Recent Labs  07/18/14 07/23/14 12/06/14 02/04/15  WBC 30.5 5.2 5.3  --   HGB 10.9* 16.1* 15.0 14.8  HCT 33* 48* 43 46  PLT 98* 367 252 321    Lab Results  Component Value Date   TSH 2.30 02/04/2015   No results found for: HGBA1C Lab Results  Component Value Date   CHOL 121 11/22/2012   HDL 40 11/22/2012   LDLCALC 65 11/22/2012   TRIG 81 11/22/2012    Significant Diagnostic Results since last visit: none  Patient Care Team: Estill Dooms, MD as PCP - General (Internal Medicine) Gaynelle Arabian, MD as Consulting Physician (Orthopedic Surgery) Jolon Degante X, NP as Nurse Practitioner (Nurse Practitioner) [place] (Skilled Nursing and [place])  Assessment/Plan Problem List Items Addressed This Visit    Back pain, thoracic - Primary    Pain is better controlled, change Norco to prn 2nd to c/o too drowsy, continue Cymbalta.       Constipation    Controlled. Continue MiraLax bid      COPD (chronic obstructive pulmonary disease) ([place])    Resolved, CXR 12/14/14 ruled out PNA, last treated with  7 day course of Augmentin 875mg  q12h Continue Spiriva daily, prn Ventolin 2 puffs q6,       Depression    mood is stabilized, switched from Zoloft to Cymbalta in setting of back pain.       Edema    Ankles/feet, chronic, continue Furosemide 10mg  qod. No significant weight change.      GERD (gastroesophageal reflux disease) (Chronic)    Stable, continue Famotidine 20mg  daily.       HTN (hypertension) (Chronic)    Controlled, continue Clonidine 0.58mf, Carvedilol 12.5mg  bid, Diltiazem 120mg , Losartan 50mg  daily, monitor Bp      Hypothyroidism (Chronic)    Continue Levothyroxine 53mcg daily, TSH 2.599 12/06/14, 2.295 02/04/15          Family/ staff Communication: continue SNF for care needs.   Labs/tests ordered: none  ManXie Eileen Kangas NP [facility] [address], [place] [address] On Call:  [phone] & follow prompts after 5pm & weekends  Office Phone:  [phone] Office Fax:  [phone]

## 2015-03-01 NOTE — Assessment & Plan Note (Signed)
Ankles/feet, chronic, continue Furosemide 10mg qod. No significant weight change. 

## 2015-03-01 NOTE — Assessment & Plan Note (Addendum)
mood is stabilized, switched from Zoloft to Cymbalta in setting of back pain.

## 2015-03-01 NOTE — Assessment & Plan Note (Addendum)
Pain is better controlled, change Norco to prn 2nd to c/o too drowsy, continue Cymbalta.

## 2015-03-01 NOTE — Assessment & Plan Note (Signed)
Stable, continue Famotidine 20mg daily.   

## 2015-03-01 NOTE — Assessment & Plan Note (Signed)
Controlled. Continue MiraLax bid 

## 2015-03-26 ENCOUNTER — Non-Acute Institutional Stay (SKILLED_NURSING_FACILITY): Payer: Medicare Other | Admitting: Nurse Practitioner

## 2015-03-26 ENCOUNTER — Encounter: Payer: Self-pay | Admitting: Nurse Practitioner

## 2015-03-26 DIAGNOSIS — I10 Essential (primary) hypertension: Secondary | ICD-10-CM | POA: Diagnosis not present

## 2015-03-26 DIAGNOSIS — R5382 Chronic fatigue, unspecified: Secondary | ICD-10-CM | POA: Diagnosis not present

## 2015-03-26 DIAGNOSIS — K219 Gastro-esophageal reflux disease without esophagitis: Secondary | ICD-10-CM | POA: Diagnosis not present

## 2015-03-26 DIAGNOSIS — J439 Emphysema, unspecified: Secondary | ICD-10-CM

## 2015-03-26 DIAGNOSIS — K5900 Constipation, unspecified: Secondary | ICD-10-CM | POA: Diagnosis not present

## 2015-03-26 DIAGNOSIS — M546 Pain in thoracic spine: Secondary | ICD-10-CM | POA: Diagnosis not present

## 2015-03-26 DIAGNOSIS — F32A Depression, unspecified: Secondary | ICD-10-CM

## 2015-03-26 DIAGNOSIS — R5383 Other fatigue: Secondary | ICD-10-CM | POA: Insufficient documentation

## 2015-03-26 DIAGNOSIS — E039 Hypothyroidism, unspecified: Secondary | ICD-10-CM

## 2015-03-26 DIAGNOSIS — F329 Major depressive disorder, single episode, unspecified: Secondary | ICD-10-CM

## 2015-03-26 NOTE — Assessment & Plan Note (Signed)
Controlled, continue Clonidine 0.1mf, Carvedilol 12.5mg bid, Diltiazem 120mg, Losartan 50mg daily, monitor Bp 

## 2015-03-26 NOTE — Assessment & Plan Note (Signed)
Resolved, CXR 12/14/14 ruled out PNA, last treated with  7 day course of Augmentin 875mg  q12h Continue Spiriva daily, prn Ventolin 2 puffs q6h

## 2015-03-26 NOTE — Assessment & Plan Note (Signed)
C/o feeling bad, shaky, unspecific, denied chest pain, palpitation, abd or CVA tenderness, dysuria, nausea, or vomiting. Will reduce Cymbalta from 30mg  to 20mg . Update CBC, CMP, UA C/S.

## 2015-03-26 NOTE — Assessment & Plan Note (Signed)
TSH 2.599 12/06/14, 2.295 02/04/15. Continue Levothyroxine 36mcg daily

## 2015-03-26 NOTE — Assessment & Plan Note (Signed)
Controlled. Continue MiraLax bid 

## 2015-03-26 NOTE — Assessment & Plan Note (Signed)
mood is stabilized, switched from Zoloft to Cymbalta in setting of back pain, will reduce Cymbalta from 30mg  to 20mg  daily due to c/o feeling shaky. Observe.

## 2015-03-26 NOTE — Assessment & Plan Note (Signed)
Pain is better controlled, continue Norco prn, Cymbalta 20mg  

## 2015-03-26 NOTE — Progress Notes (Signed)
sPatient ID: Dana George, female   DOB: 04-21-1923, 82 y.o.   MRN: [id number]  Location:  SNF [facility] Provider:  Marlana Latus NP  Code Status:  DNR Goals of care: Advanced Directive information    Chief Complaint  Patient presents with  . Medical Management of Chronic Issues     HPI: Patient is a 82 y.o. female seen in the SNF at [facility] today for evaluation of felling bad, shaky, but no apparent tremor, afebrile, urinary frequency average q2-3hours, but denied dysuria, abd or CVA tenderness. Hx of hypothyroidism, takes Levothyroxine 44mcg, last TSH 2.295 02/04/15. Blood pressure has been controlled while on Clonidine 0.1mg , Diltiazem 120mg , Losartan 50mg , Carvedilol 12.5mg  bid, arthrotic pain is controlled on Norco prn, scheduled Norco made her too sleepy,  asymptomatic of GERD or constipation.   Review of Systems  Constitutional: Positive for malaise/fatigue. Negative for fever, chills and diaphoresis.       Feeling shaky.   HENT: Positive for hearing loss. Negative for congestion, ear discharge and ear pain.   Eyes: Negative for pain, discharge and redness.  Respiratory: Negative for cough, shortness of breath, wheezing and stridor.   Cardiovascular: Positive for leg swelling. Negative for chest pain and palpitations.       Trace in ankles.   Gastrointestinal: Negative for nausea, vomiting, abdominal pain, diarrhea and constipation.       Lower abd incisional hernia.    Genitourinary: Positive for frequency. Negative for dysuria, urgency, hematuria and flank pain.       Q2-3 hours average.   Musculoskeletal: Positive for back pain. Negative for myalgias and neck pain.       Improved back pain. She is able to ambulates with walker on unit.  Resolution of Left hip pain since 11/25/14.  Skin: Negative for rash.       Lower abd incisional hernia-reducible.  Neurological: Negative for dizziness, tremors, seizures, weakness and headaches.  Psychiatric/Behavioral: Positive for  memory loss. Negative for suicidal ideas and hallucinations. The patient is not nervous/anxious.        C/o sleeps too much.    Past Medical History  Diagnosis Date  . Hypothyroidism   . Hypertension   . Shortness of breath   . COPD (chronic obstructive pulmonary disease) ([place])   . Osteoporosis   . Cancer ([place]) 1980    Breast cancer  . TIA (transient ischemic attack)   . Incisional hernia, without obstruction or gangrene 05/27/2014    Lower abd from previous surgery-2-3 small incisional hernias which are reducible-no incarceration or pain complained.    . Insomnia 11/18/2012  . Thoracic spine fracture [facility]) 12/18/2013    01/05/14 T 10 Dr. Newman Pies: PCP managing pain.  F/u 2 months(suggested that if the pain is "not too bad" and seems to be improving that she "live with it" and give a change to heal without intervention.  12/18/13 X-ray Thoracic spine: Compression deformity at the level of T10. Comparing back to a chest x-ray from 12/09/2013, this appears new in the interval.       . Back pain, thoracic 01/12/2014  . Left hemiparesis ([place]) 06/08/2014  . CVA (cerebral infarction) 06/08/2011    Following abdominal surgery for bowel obstruction   . Granuloma annulare 06/08/2014  . Constipation 11/18/2012  . Depression 12/26/2013  . DVT (deep venous thrombosis) ([place]) 11/18/2012  . Fall 02/07/2013  . GERD (gastroesophageal reflux disease) 03/30/2014  . HTN (hypertension) 11/18/2012    01/15/14 Bun/creat 14/0.60   .  Ventral hernia 06/08/2014    Patient Active Problem List   Diagnosis Date Noted  . Fatigue 03/26/2015  . Pain in left hip 12/03/2014  . Cough 12/03/2014  . Lower back pain 11/30/2014  . Pain of left upper arm 07/06/2014  . Left hemiparesis ([place]) 06/08/2014  . Granuloma annulare 06/08/2014  . Ventral hernia 06/08/2014  . Incisional hernia, without obstruction or gangrene 05/27/2014  . GERD (gastroesophageal reflux disease) 03/30/2014  . Back pain, thoracic 01/12/2014  .  Depression 12/26/2013  . Thoracic spine fracture ([place]) 12/18/2013  . Fall 02/07/2013  . Chest pain 02/07/2013  . Edema 11/25/2012  . TIA (transient ischemic attack) 11/18/2012  . HTN (hypertension) 11/18/2012  . COPD (chronic obstructive pulmonary disease) ([place]) 11/18/2012  . Hx of breast cancer 11/18/2012  . DVT (deep venous thrombosis) ([place]) 11/18/2012  . Insomnia 11/18/2012  . Constipation 11/18/2012  . Hypothyroidism 11/18/2012  . CVA (cerebral infarction) 10/25/2012    No Known Allergies  Medications: Patient's Medications  New Prescriptions   No medications on file  Previous Medications   ACETAMINOPHEN (TYLENOL) 325 MG TABLET    Take 650 mg by mouth every 4 (four) hours as needed for moderate pain.   ASPIRIN 81 MG CHEWABLE TABLET    Chew 81 mg by mouth every morning.   CARVEDILOL (COREG) 12.5 MG TABLET    Take 12.5 mg by mouth 2 (two) times daily with a meal.   CLONIDINE (CATAPRES) 0.1 MG TABLET    Take 0.1 mg by mouth every morning.    DILTIAZEM (CARDIZEM CD) 120 MG 24 HR CAPSULE    Take 120 mg by mouth daily.    DOCUSATE SODIUM (COLACE) 100 MG CAPSULE    Take 1 capsule (100 mg total) by mouth 2 (two) times daily.   FUROSEMIDE (LASIX) 20 MG TABLET    Take 10 mg by mouth every other day.    LEVOTHYROXINE (SYNTHROID, LEVOTHROID) 50 MCG TABLET    Take 50 mcg by mouth every morning.    LISINOPRIL (PRINIVIL,ZESTRIL) 40 MG TABLET    Take 20 mg by mouth every morning.    MELATONIN 3 MG TBDP    Take 3 mg by mouth at bedtime.   MULTIPLE VITAMIN (MULTIVITAMIN WITH MINERALS) TABS TABLET    Take 1 tablet by mouth every morning.   OMEPRAZOLE (PRILOSEC) 20 MG CAPSULE    Take 20 mg by mouth daily.   POLYETHYLENE GLYCOL (MIRALAX / GLYCOLAX) PACKET    Take 17 g by mouth daily as needed.    SERTRALINE (ZOLOFT) 50 MG TABLET    Take 50 mg by mouth at bedtime.   TIOTROPIUM (SPIRIVA) 18 MCG INHALATION CAPSULE    Place 18 mcg into inhaler and inhale daily.  Modified Medications   No  medications on file  Discontinued Medications   No medications on file    Physical Exam: Filed Vitals:   03/26/15 1446  BP: 125/73  Pulse: 90  Temp: 98.5 F (36.9 C)  TempSrc: Tympanic  Resp: 18   There is no weight on file to calculate BMI.  Physical Exam  Constitutional: She is oriented to person, place, and time. She appears well-developed and well-nourished. No distress.  HENT:  Head: Normocephalic and atraumatic.  Right Ear: External ear normal.  Left Ear: External ear normal.  Nose: Nose normal.  Mouth/Throat: Oropharynx is clear and moist. No oropharyngeal exudate.  Eyes: Conjunctivae and EOM are normal. Pupils are equal, round, and reactive to light. Right eye exhibits  no discharge. Left eye exhibits no discharge. No scleral icterus.  Neck: Normal range of motion. Neck supple. No JVD present. No tracheal deviation present. No thyromegaly present.  Cardiovascular: Normal rate, regular rhythm, normal heart sounds and intact distal pulses.   No murmur heard. Pulmonary/Chest: Effort normal. No stridor. No respiratory distress. She has decreased breath sounds. She has no wheezes. She has rales. She exhibits no tenderness.  bibasilar  Abdominal: Soft. She exhibits no distension and no mass. There is no tenderness.  Musculoskeletal: Normal range of motion. She exhibits edema and tenderness.  Ankles and feet have trace edema. Mid back pain.  Unstable gait. Uses rolling walker.  Neurological: She is alert and oriented to person, place, and time. No cranial nerve deficit. She exhibits normal muscle tone. Coordination normal.  Left hemiparesis  Skin: Skin is warm and dry. No rash noted. She is not diaphoretic. No erythema. No pallor.  Mid abd surgical scar-healed-lower abd reducible incisional hernia-the patient has concerns of cosmetic reason but denied pain.  Left mastectomy Scalp skin lesion above the left ear.   Psychiatric: She has a normal mood and affect. Her behavior is  normal. Judgment and thought content normal.  Repetitive.     Labs reviewed: Basic Metabolic Panel:  Recent Labs  07/18/14 12/06/14 02/04/15  NA 140 140 140  K 4.4 4.6 4.9  BUN 30* 25* 20  CREATININE 0.6 0.6 0.6    Liver Function Tests:  Recent Labs  07/18/14 02/04/15  AST 20 16  ALT 15 10  ALKPHOS 84 89    CBC:  Recent Labs  07/18/14 07/23/14 12/06/14 02/04/15  WBC 30.5 5.2 5.3  --   HGB 10.9* 16.1* 15.0 14.8  HCT 33* 48* 43 46  PLT 98* 367 252 321    Lab Results  Component Value Date   TSH 2.30 02/04/2015   No results found for: HGBA1C Lab Results  Component Value Date   CHOL 121 11/22/2012   HDL 40 11/22/2012   LDLCALC 65 11/22/2012   TRIG 81 11/22/2012    Significant Diagnostic Results since last visit: none  Patient Care Team: Estill Dooms, MD as PCP - General (Internal Medicine) Gaynelle Arabian, MD as Consulting Physician (Orthopedic Surgery) Naly Schwanz X, NP as Nurse Practitioner (Nurse Practitioner) [place] [facility] [facility] [place])  Assessment/Plan Problem List Items Addressed This Visit    Hypothyroidism (Chronic)    TSH 2.599 12/06/14, 2.295 02/04/15. Continue Levothyroxine 50mcg daily      HTN (hypertension) (Chronic)    Controlled, continue Clonidine 0.46mf, Carvedilol 12.5mg  bid, Diltiazem 120mg , Losartan 50mg  daily, monitor Bp      GERD (gastroesophageal reflux disease) (Chronic)    Stable, continue Famotidine 20mg  daily.       Fatigue - Primary    C/o feeling bad, shaky, unspecific, denied chest pain, palpitation, abd or CVA tenderness, dysuria, nausea, or vomiting. Will reduce Cymbalta from 30mg  to 20mg . Update CBC, CMP, UA C/S.      Depression    mood is stabilized, switched from Zoloft to Cymbalta in setting of back pain, will reduce Cymbalta from 30mg  to 20mg  daily due to c/o feeling shaky. Observe.       COPD (chronic obstructive pulmonary disease) ([place])    Resolved, CXR 12/14/14 ruled out  PNA, last treated with  7 day course of Augmentin 875mg  q12h Continue Spiriva daily, prn Ventolin 2 puffs q6h      Constipation    Controlled. Continue MiraLax  bid      Back pain, thoracic    Pain is better controlled, continue Norco prn, Cymbalta 20mg           Family/ staff Communication: continue SNF for care needs.   Labs/tests ordered: CBC, CMP, UA C/S  [facility] Naphtali Riede NP [facility] [address] [place], [place] [address] On Call:  [phone] & follow prompts after 5pm & weekends Office Phone:  [phone] Office Fax:  [phone]

## 2015-03-26 NOTE — Assessment & Plan Note (Signed)
Stable, continue Famotidine 20mg daily.   

## 2015-03-27 DIAGNOSIS — E876 Hypokalemia: Secondary | ICD-10-CM | POA: Diagnosis not present

## 2015-03-27 DIAGNOSIS — D649 Anemia, unspecified: Secondary | ICD-10-CM | POA: Diagnosis not present

## 2015-03-27 DIAGNOSIS — N390 Urinary tract infection, site not specified: Secondary | ICD-10-CM | POA: Diagnosis not present

## 2015-03-29 ENCOUNTER — Other Ambulatory Visit: Payer: Self-pay | Admitting: Nurse Practitioner

## 2015-03-29 LAB — CBC AND DIFFERENTIAL
HCT: 45 % (ref 36–46)
Hemoglobin: 15.4 g/dL (ref 12.0–16.0)
Platelets: 379 10*3/uL (ref 150–399)
WBC: 6 10^3/mL

## 2015-03-29 LAB — BASIC METABOLIC PANEL
BUN: 25 mg/dL — AB (ref 4–21)
Creatinine: 0.6 mg/dL (ref 0.5–1.1)
Glucose: 93 mg/dL
POTASSIUM: 4.3 mmol/L (ref 3.4–5.3)
SODIUM: 140 mmol/L (ref 137–147)

## 2015-03-29 LAB — HEPATIC FUNCTION PANEL
ALT: 16 U/L (ref 7–35)
AST: 19 U/L (ref 13–35)
Alkaline Phosphatase: 80 U/L (ref 25–125)
Bilirubin, Total: 0.5 mg/dL

## 2015-04-26 ENCOUNTER — Non-Acute Institutional Stay (SKILLED_NURSING_FACILITY): Payer: Medicare Other | Admitting: Nurse Practitioner

## 2015-04-26 ENCOUNTER — Encounter: Payer: Self-pay | Admitting: Nurse Practitioner

## 2015-04-26 DIAGNOSIS — F329 Major depressive disorder, single episode, unspecified: Secondary | ICD-10-CM | POA: Diagnosis not present

## 2015-04-26 DIAGNOSIS — J410 Simple chronic bronchitis: Secondary | ICD-10-CM

## 2015-04-26 DIAGNOSIS — E039 Hypothyroidism, unspecified: Secondary | ICD-10-CM

## 2015-04-26 DIAGNOSIS — K5900 Constipation, unspecified: Secondary | ICD-10-CM | POA: Diagnosis not present

## 2015-04-26 DIAGNOSIS — K219 Gastro-esophageal reflux disease without esophagitis: Secondary | ICD-10-CM

## 2015-04-26 DIAGNOSIS — R5382 Chronic fatigue, unspecified: Secondary | ICD-10-CM | POA: Diagnosis not present

## 2015-04-26 DIAGNOSIS — M546 Pain in thoracic spine: Secondary | ICD-10-CM | POA: Diagnosis not present

## 2015-04-26 DIAGNOSIS — F32A Depression, unspecified: Secondary | ICD-10-CM

## 2015-04-26 DIAGNOSIS — I10 Essential (primary) hypertension: Secondary | ICD-10-CM | POA: Diagnosis not present

## 2015-04-26 NOTE — Progress Notes (Signed)
Patient ID: Dana George, female   DOB: 12-12-1923, 82 y.o.   MRN: [id number]

## 2015-04-29 NOTE — Assessment & Plan Note (Signed)
Stable, continue Famotidine 20mg daily.   

## 2015-04-29 NOTE — Assessment & Plan Note (Addendum)
Resolved, CXR 12/14/14 ruled out PNA, last treated with  7 day course of Augmentin 875mg  q12h Continue Spiriva daily, prn Ventolin 2 puffs q6h

## 2015-04-29 NOTE — Assessment & Plan Note (Signed)
Controlled. Continue MiraLax bid 

## 2015-04-29 NOTE — Assessment & Plan Note (Signed)
Better since Cymbalta was reduced from 30mg  to 20mg .

## 2015-04-29 NOTE — Assessment & Plan Note (Signed)
TSH 2.599 12/06/14, 2.295 02/04/15. Continue Levothyroxine 36mcg daily

## 2015-04-29 NOTE — Assessment & Plan Note (Signed)
Controlled, continue Clonidine 0.1mf, Carvedilol 12.5mg bid, Diltiazem 120mg, Losartan 50mg daily, monitor Bp 

## 2015-04-29 NOTE — Assessment & Plan Note (Signed)
mood is stabilized, switched from Zoloft to Cymbalta in setting of back pain, reduced Cymbalta 20mg daily due to c/o feeling shaky-improved. Observe. 

## 2015-04-29 NOTE — Assessment & Plan Note (Signed)
Pain is better controlled, continue Norco prn, Cymbalta 20mg  

## 2015-04-29 NOTE — Progress Notes (Signed)
Patient ID: Dana George, female   DOB: 1923/04/18, 82 y.o.   MRN: [id number]  Location:  SNF [facility] Provider:  Marlana Latus NP  Code Status:  DNR Goals of care: Advanced Directive information Does patient have an advance directive?: Yes, Type of Advance Directive: [place];Out of facility DNR (pink MOST or yellow form)  Chief Complaint  Patient presents with  . Medical Management of Chronic Issues    Routine Visit     HPI: Patient is a 82 y.o. female seen in the SNF at [facility] today for evaluation of Hx of hypothyroidism, takes Levothyroxine 39mcg, last TSH 2.295 02/04/15. Blood pressure has been controlled while on Clonidine 0.1mg , Diltiazem 120mg , Losartan 50mg , Carvedilol 12.5mg  bid, arthrotic pain is controlled on Norco prn, scheduled Norco made her too sleepy,  asymptomatic of GERD or constipation.   Review of Systems  Constitutional: Negative for fever, chills, malaise/fatigue and diaphoresis.  HENT: Positive for hearing loss. Negative for congestion, ear discharge and ear pain.   Eyes: Negative for pain, discharge and redness.  Respiratory: Negative for cough, shortness of breath, wheezing and stridor.   Cardiovascular: Positive for leg swelling. Negative for chest pain and palpitations.       Trace in ankles.   Gastrointestinal: Negative for nausea, vomiting, abdominal pain, diarrhea and constipation.       Lower abd incisional hernia.    Genitourinary: Positive for frequency. Negative for dysuria, urgency, hematuria and flank pain.       Q2-3 hours average.   Musculoskeletal: Positive for back pain. Negative for myalgias and neck pain.       Improved back pain. She is able to ambulates with walker on unit.  Resolution of Left hip pain since 11/25/14.  Skin: Negative for rash.       Lower abd incisional hernia-reducible.  Neurological: Negative for dizziness, tremors, seizures, weakness and headaches.  Psychiatric/Behavioral: Positive for memory  loss. Negative for suicidal ideas and hallucinations. The patient is not nervous/anxious.        C/o sleeps too much.    Past Medical History  Diagnosis Date  . Hypothyroidism   . Hypertension   . Shortness of breath   . COPD (chronic obstructive pulmonary disease) ([place])   . Osteoporosis   . Cancer ([place]) 1980    Breast cancer  . TIA (transient ischemic attack)   . Incisional hernia, without obstruction or gangrene 05/27/2014    Lower abd from previous surgery-2-3 small incisional hernias which are reducible-no incarceration or pain complained.    . Insomnia 11/18/2012  . Thoracic spine fracture [facility]) 12/18/2013    01/05/14 T 10 Dr. Newman Pies: PCP managing pain.  F/u 2 months(suggested that if the pain is "not too bad" and seems to be improving that she "live with it" and give a change to heal without intervention.  12/18/13 X-ray Thoracic spine: Compression deformity at the level of T10. Comparing back to a chest x-ray from 12/09/2013, this appears new in the interval.       . Back pain, thoracic 01/12/2014  . Left hemiparesis ([place]) 06/08/2014  . CVA (cerebral infarction) 06/08/2011    Following abdominal surgery for bowel obstruction   . Granuloma annulare 06/08/2014  . Constipation 11/18/2012  . Depression 12/26/2013  . DVT (deep venous thrombosis) ([place]) 11/18/2012  . Fall 02/07/2013  . GERD (gastroesophageal reflux disease) 03/30/2014  . HTN (hypertension) 11/18/2012    01/15/14 Bun/creat 14/0.60   . Ventral hernia 06/08/2014  Patient Active Problem List   Diagnosis Date Noted  . Fatigue 03/26/2015  . Pain in left hip 12/03/2014  . Cough 12/03/2014  . Lower back pain 11/30/2014  . Pain of left upper arm 07/06/2014  . Left hemiparesis ([place]) 06/08/2014  . Granuloma annulare 06/08/2014  . Ventral hernia 06/08/2014  . Incisional hernia, without obstruction or gangrene 05/27/2014  . GERD (gastroesophageal reflux disease) 03/30/2014  . Back pain, thoracic 01/12/2014  . Depression  12/26/2013  . Thoracic spine fracture ([place]) 12/18/2013  . Fall 02/07/2013  . Chest pain 02/07/2013  . Edema 11/25/2012  . TIA (transient ischemic attack) 11/18/2012  . HTN (hypertension) 11/18/2012  . COPD (chronic obstructive pulmonary disease) ([place]) 11/18/2012  . Hx of breast cancer 11/18/2012  . DVT (deep venous thrombosis) ([place]) 11/18/2012  . Insomnia 11/18/2012  . Constipation 11/18/2012  . Hypothyroidism 11/18/2012  . CVA (cerebral infarction) 10/25/2012    Allergies  Allergen Reactions  . Cortisone     Medications: Patient's Medications  New Prescriptions   No medications on file  Previous Medications   ALBUTEROL (VENTOLIN HFA) 108 (90 BASE) MCG/ACT INHALER    Inhale 2 puffs into the lungs every 6 (six) hours as needed for wheezing or shortness of breath.   ANTISEPTIC ORAL RINSE (BIOTENE) LIQD    15 mLs by Mouth Rinse route as needed for dry mouth.   ASPIRIN 81 MG CHEWABLE TABLET    Chew 81 mg by mouth every morning.   CARVEDILOL (COREG) 12.5 MG TABLET    Take 12.5 mg by mouth 2 (two) times daily with a meal.   CLONIDINE (CATAPRES) 0.1 MG TABLET    Take 0.1 mg by mouth every morning.    DEXTROMETHORPHAN HBR (ROBAFEN COUGH PO)    Take 10 ml by mouth every 6 hours as needed for cough   DILTIAZEM (CARDIZEM CD) 120 MG 24 HR CAPSULE    Take 120 mg by mouth daily.    DULOXETINE (CYMBALTA) 30 MG CAPSULE    Take 30 mg by mouth daily.   FAMOTIDINE (PEPCID) 20 MG TABLET    Take 20 mg by mouth at bedtime.   FEEDING SUPPLEMENT (BOOST / RESOURCE BREEZE) LIQD    Take 1 Container by mouth 3 (three) times daily between meals.   GLYCERIN, LAXATIVE, (PEDIA-LAX) 2.8 G SUPP    Place rectally as needed (for constipation).   HYDROCODONE-ACETAMINOPHEN (NORCO/VICODIN) 5-325 MG TABLET    Take 1 tablet by mouth every 6 (six) hours as needed for moderate pain.   LEVOTHYROXINE (SYNTHROID, LEVOTHROID) 50 MCG TABLET    Take 50 mcg by mouth every morning.    LOSARTAN (COZAAR) 50 MG TABLET    Take 50  mg by mouth daily.   MELATONIN 3 MG TBDP    Take 3 mg by mouth at bedtime.   MULTIPLE VITAMIN (MULTIVITAMIN WITH MINERALS) TABS TABLET    Take 1 tablet by mouth every morning.   POLYETHYLENE GLYCOL (MIRALAX / GLYCOLAX) PACKET    Take 17 g by mouth daily as needed.    POLYVINYL ALCOHOL (LIQUIFILM TEARS) 1.4 % OPHTHALMIC SOLUTION    Place 1 drop into both eyes 4 (four) times daily.   TIOTROPIUM (SPIRIVA) 18 MCG INHALATION CAPSULE    Place 18 mcg into inhaler and inhale daily.  Modified Medications   No medications on file  Discontinued Medications   ACETAMINOPHEN (TYLENOL) 325 MG TABLET    Take 650 mg by mouth every 4 (four) hours as needed for moderate  pain.   DOCUSATE SODIUM (COLACE) 100 MG CAPSULE    Take 1 capsule (100 mg total) by mouth 2 (two) times daily.   FUROSEMIDE (LASIX) 20 MG TABLET    Take 10 mg by mouth every other day.    LISINOPRIL (PRINIVIL,ZESTRIL) 40 MG TABLET    Take 20 mg by mouth every morning. Reported on 04/26/2015   OMEPRAZOLE (PRILOSEC) 20 MG CAPSULE    Take 20 mg by mouth daily.   SERTRALINE (ZOLOFT) 50 MG TABLET    Take 50 mg by mouth at bedtime.    Physical Exam: Filed Vitals:   04/26/15 1432  BP: 118/50  Pulse: 72  Temp: 97.6 F (36.4 C)  TempSrc: Oral  Resp: 20  Height: 5\' 2"  (1.575 m)  Weight: 116 lb (52.617 kg)   Body mass index is 21.21 kg/(m^2).  Physical Exam  Constitutional: She is oriented to person, place, and time. She appears well-developed and well-nourished. No distress.  HENT:  Head: Normocephalic and atraumatic.  Right Ear: External ear normal.  Left Ear: External ear normal.  Nose: Nose normal.  Mouth/Throat: Oropharynx is clear and moist. No oropharyngeal exudate.  Eyes: Conjunctivae and EOM are normal. Pupils are equal, round, and reactive to light. Right eye exhibits no discharge. Left eye exhibits no discharge. No scleral icterus.  Neck: Normal range of motion. Neck supple. No JVD present. No tracheal deviation present. No  thyromegaly present.  Cardiovascular: Normal rate, regular rhythm, normal heart sounds and intact distal pulses.   No murmur heard. Pulmonary/Chest: Effort normal. No stridor. No respiratory distress. She has decreased breath sounds. She has no wheezes. She has rales. She exhibits no tenderness.  bibasilar  Abdominal: Soft. She exhibits no distension and no mass. There is no tenderness.  Musculoskeletal: Normal range of motion. She exhibits edema and tenderness.  Ankles and feet have trace edema. Mid back pain.  Unstable gait. Uses rolling walker.  Neurological: She is alert and oriented to person, place, and time. No cranial nerve deficit. She exhibits normal muscle tone. Coordination normal.  Left hemiparesis  Skin: Skin is warm and dry. No rash noted. She is not diaphoretic. No erythema. No pallor.  Mid abd surgical scar-healed-lower abd reducible incisional hernia-the patient has concerns of cosmetic reason but denied pain.  Left mastectomy Scalp skin lesion above the left ear.   Psychiatric: She has a normal mood and affect. Her behavior is normal. Judgment and thought content normal.  Repetitive.     Labs reviewed: Basic Metabolic Panel:  Recent Labs  12/06/14 02/04/15 03/29/15  NA 140 140 140  K 4.6 4.9 4.3  BUN 25* 20 25*  CREATININE 0.6 0.6 0.6    Liver Function Tests:  Recent Labs  07/18/14 02/04/15 03/29/15  AST 20 16 19   ALT 15 10 16   ALKPHOS 84 89 80    CBC:  Recent Labs  07/23/14 12/06/14 02/04/15 03/29/15  WBC 5.2 5.3  --  6.0  HGB 16.1* 15.0 14.8 15.4  HCT 48* 43 46 45  PLT 367 252 321 379    Lab Results  Component Value Date   TSH 2.30 02/04/2015   No results found for: HGBA1C Lab Results  Component Value Date   CHOL 121 11/22/2012   HDL 40 11/22/2012   LDLCALC 65 11/22/2012   TRIG 81 11/22/2012    Significant Diagnostic Results since last visit: none  Patient Care Team: Estill Dooms, MD as PCP - General (Internal Medicine) Gaynelle Arabian, MD  as Consulting Physician (Orthopedic Surgery) Doraine Schexnider X, NP as Nurse Practitioner (Nurse Practitioner) [place] [facility] [place]  Assessment/Plan Problem List Items Addressed This Visit    HTN (hypertension) - Primary (Chronic)    Controlled, continue Clonidine 0.59mf, Carvedilol 12.5mg  bid, Diltiazem 120mg , Losartan 50mg  daily, monitor Bp      Relevant Medications   losartan (COZAAR) 50 MG tablet   Hypothyroidism (Chronic)    TSH 2.599 12/06/14, 2.295 02/04/15. Continue Levothyroxine 84mcg daily      GERD (gastroesophageal reflux disease) (Chronic)    Stable, continue Famotidine 20mg  daily.       Relevant Medications   famotidine (PEPCID) 20 MG tablet   Glycerin, Laxative, (PEDIA-LAX) 2.8 g SUPP   COPD (chronic obstructive pulmonary disease) ([place])    Resolved, CXR 12/14/14 ruled out PNA, last treated with  7 day course of Augmentin 875mg  q12h Continue Spiriva daily, prn Ventolin 2 puffs q6h      Relevant Medications   Dextromethorphan HBr (ROBAFEN COUGH PO)   albuterol (VENTOLIN HFA) 108 (90 Base) MCG/ACT inhaler   Constipation    Controlled. Continue MiraLax bid      Depression    mood is stabilized, switched from Zoloft to Cymbalta in setting of back pain, reduced Cymbalta 20mg  daily due to c/o feeling shaky-improved. Observe.       Relevant Medications   DULoxetine (CYMBALTA) 30 MG capsule   Back pain, thoracic    Pain is better controlled, continue Norco prn, Cymbalta 20mg       Relevant Medications   HYDROcodone-acetaminophen (NORCO/VICODIN) 5-325 MG tablet   Fatigue    Better since Cymbalta was reduced from 30mg  to 20mg .           Family/ staff Communication: continue SNF for care needs.   Labs/tests ordered: none  ManXie Precilla Purnell NP [facility] [address], [place] [address] On Call:  [phone] & follow prompts after 5pm & weekends Office Phone:   [phone] Office Fax:  [phone]

## 2015-05-07 ENCOUNTER — Encounter: Payer: Self-pay | Admitting: Nurse Practitioner

## 2015-05-07 ENCOUNTER — Non-Acute Institutional Stay (SKILLED_NURSING_FACILITY): Payer: Medicare Other | Admitting: Nurse Practitioner

## 2015-05-07 DIAGNOSIS — K5900 Constipation, unspecified: Secondary | ICD-10-CM

## 2015-05-07 DIAGNOSIS — M546 Pain in thoracic spine: Secondary | ICD-10-CM | POA: Diagnosis not present

## 2015-05-07 DIAGNOSIS — J209 Acute bronchitis, unspecified: Secondary | ICD-10-CM

## 2015-05-07 DIAGNOSIS — K219 Gastro-esophageal reflux disease without esophagitis: Secondary | ICD-10-CM

## 2015-05-07 DIAGNOSIS — I10 Essential (primary) hypertension: Secondary | ICD-10-CM

## 2015-05-07 DIAGNOSIS — F329 Major depressive disorder, single episode, unspecified: Secondary | ICD-10-CM

## 2015-05-07 DIAGNOSIS — R5382 Chronic fatigue, unspecified: Secondary | ICD-10-CM

## 2015-05-07 DIAGNOSIS — F32A Depression, unspecified: Secondary | ICD-10-CM

## 2015-05-07 DIAGNOSIS — E039 Hypothyroidism, unspecified: Secondary | ICD-10-CM | POA: Diagnosis not present

## 2015-05-07 DIAGNOSIS — J410 Simple chronic bronchitis: Secondary | ICD-10-CM

## 2015-05-07 LAB — BASIC METABOLIC PANEL
BUN: 25 mg/dL — AB (ref 4–21)
Creatinine: 0.7 mg/dL (ref 0.5–1.1)
GLUCOSE: 173 mg/dL
Potassium: 4.3 mmol/L (ref 3.4–5.3)
Sodium: 136 mmol/L — AB (ref 137–147)

## 2015-05-07 LAB — HEPATIC FUNCTION PANEL
ALT: 14 U/L (ref 7–35)
AST: 18 U/L (ref 13–35)
Alkaline Phosphatase: 108 U/L (ref 25–125)
BILIRUBIN, TOTAL: 0.4 mg/dL

## 2015-05-07 LAB — CBC AND DIFFERENTIAL
HEMATOCRIT: 46 % (ref 36–46)
HEMOGLOBIN: 15.2 g/dL (ref 12.0–16.0)
Platelets: 352 10*3/uL (ref 150–399)
WBC: 6.7 10^3/mL

## 2015-05-07 NOTE — Assessment & Plan Note (Signed)
Pain is better controlled, continue Norco prn, Cymbalta 20mg  

## 2015-05-07 NOTE — Assessment & Plan Note (Signed)
C/o congestive cough, yellow phlegm, stayed in bed x3 days, running nose, afebrile, no O2 desaturation, denied chest pain. Augmentin 875mg  q12h x 7 days, CBC, CMP, CXR

## 2015-05-07 NOTE — Assessment & Plan Note (Signed)
Stable, continue Famotidine 20mg daily.   

## 2015-05-07 NOTE — Assessment & Plan Note (Signed)
TSH 2.599 12/06/14, 2.295 02/04/15. Continue Levothyroxine 36mcg daily

## 2015-05-07 NOTE — Assessment & Plan Note (Signed)
Controlled. Continue MiraLax bid 

## 2015-05-07 NOTE — Assessment & Plan Note (Signed)
mood is stabilized, switched from Zoloft to Cymbalta in setting of back pain, reduced Cymbalta 20mg daily due to c/o feeling shaky-improved. Observe. 

## 2015-05-07 NOTE — Assessment & Plan Note (Signed)
Worse in the past 2-3 days since the onset of congestive cough. Observe.

## 2015-05-07 NOTE — Assessment & Plan Note (Addendum)
CXR 12/14/14 ruled out PNA, last treated with  7 day course of Augmentin 875mg  q12h Continue Spiriva daily, prn Ventolin 2 puffs q6h Congestive cough, yellow phlegm, malaise 2-3 days, denied chest pain, no O2 desaturation, afebrile, will obtain CXR, CBC, CMP, Augmentin 875mg  q12hr x7 days

## 2015-05-07 NOTE — Progress Notes (Signed)
Patient ID: Dana George, female   DOB: 1924-02-14, 82 y.o.   MRN: [id number]  Location:  SNF [facility] Provider:  Marlana Latus NP  Code Status:  DNR Goals of care: Advanced Directive information    Chief Complaint  Patient presents with  . Acute Visit    cough     HPI: Patient is a 82 y.o. female seen in the SNF at [facility] today for evaluation of congestive cough, yellow phlegm, not getting out bed for 2-3 days, decreased appetite, denied chest pain, no O2 desaturation, afebrile, no reported dysuria. Hx of hypothyroidism, takes Levothyroxine 5mcg, last TSH 2.295 02/04/15. Blood pressure has been controlled while on Clonidine 0.1mg , Diltiazem 120mg , Losartan 50mg , Carvedilol 12.5mg  bid, arthrotic pain is controlled on Norco prn, scheduled Norco made her too sleepy,  asymptomatic of GERD or constipation.   Review of Systems  Constitutional: Positive for malaise/fatigue. Negative for fever, chills and diaphoresis.       Generalized weakness.   HENT: Positive for hearing loss. Negative for congestion, ear discharge and ear pain.   Eyes: Negative for pain, discharge and redness.  Respiratory: Positive for cough and sputum production. Negative for shortness of breath, wheezing and stridor.   Cardiovascular: Positive for leg swelling. Negative for chest pain and palpitations.       Trace in ankles.   Gastrointestinal: Negative for nausea, vomiting, abdominal pain, diarrhea and constipation.       Lower abd incisional hernia.    Genitourinary: Positive for frequency. Negative for dysuria, urgency, hematuria and flank pain.       Q2-3 hours average.   Musculoskeletal: Positive for back pain. Negative for myalgias and neck pain.       Improved back pain. She is able to ambulates with walker on unit.  Resolution of Left hip pain since 11/25/14.  Skin: Negative for rash.       Lower abd incisional hernia-reducible.  Neurological: Positive for weakness. Negative for dizziness, tremors,  seizures and headaches.  Psychiatric/Behavioral: Positive for memory loss. Negative for suicidal ideas and hallucinations. The patient is not nervous/anxious.        C/o sleeps too much.    Past Medical History  Diagnosis Date  . Hypothyroidism   . Hypertension   . Shortness of breath   . COPD (chronic obstructive pulmonary disease) ([place])   . Osteoporosis   . Cancer ([place]) 1980    Breast cancer  . TIA (transient ischemic attack)   . Incisional hernia, without obstruction or gangrene 05/27/2014    Lower abd from previous surgery-2-3 small incisional hernias which are reducible-no incarceration or pain complained.    . Insomnia 11/18/2012  . Thoracic spine fracture [facility]) 12/18/2013    01/05/14 T 10 Dr. Newman Pies: PCP managing pain.  F/u 2 months(suggested that if the pain is "not too bad" and seems to be improving that she "live with it" and give a change to heal without intervention.  12/18/13 X-ray Thoracic spine: Compression deformity at the level of T10. Comparing back to a chest x-ray from 12/09/2013, this appears new in the interval.       . Back pain, thoracic 01/12/2014  . Left hemiparesis ([place]) 06/08/2014  . CVA (cerebral infarction) 06/08/2011    Following abdominal surgery for bowel obstruction   . Granuloma annulare 06/08/2014  . Constipation 11/18/2012  . Depression 12/26/2013  . DVT (deep venous thrombosis) ([place]) 11/18/2012  . Fall 02/07/2013  . GERD (gastroesophageal reflux disease) 03/30/2014  .  HTN (hypertension) 11/18/2012    01/15/14 Bun/creat 14/0.60   . Ventral hernia 06/08/2014    Patient Active Problem List   Diagnosis Date Noted  . Acute bronchitis 05/07/2015  . Fatigue 03/26/2015  . Pain in left hip 12/03/2014  . Cough 12/03/2014  . Lower back pain 11/30/2014  . Pain of left upper arm 07/06/2014  . Left hemiparesis ([place]) 06/08/2014  . Granuloma annulare 06/08/2014  . Ventral hernia 06/08/2014  . Incisional hernia, without obstruction or gangrene 05/27/2014    . GERD (gastroesophageal reflux disease) 03/30/2014  . Back pain, thoracic 01/12/2014  . Depression 12/26/2013  . Thoracic spine fracture ([place]) 12/18/2013  . Fall 02/07/2013  . Chest pain 02/07/2013  . Edema 11/25/2012  . TIA (transient ischemic attack) 11/18/2012  . HTN (hypertension) 11/18/2012  . COPD (chronic obstructive pulmonary disease) ([place]) 11/18/2012  . Hx of breast cancer 11/18/2012  . DVT (deep venous thrombosis) ([place]) 11/18/2012  . Insomnia 11/18/2012  . Constipation 11/18/2012  . Hypothyroidism 11/18/2012  . CVA (cerebral infarction) 10/25/2012    Allergies  Allergen Reactions  . Cortisone     Medications: Patient's Medications  New Prescriptions   No medications on file  Previous Medications   ACETAMINOPHEN (TYLENOL) 325 MG TABLET    Take 650 mg by mouth every 4 (four) hours as needed for moderate pain.   ALBUTEROL (VENTOLIN HFA) 108 (90 BASE) MCG/ACT INHALER    Inhale 2 puffs into the lungs every 6 (six) hours as needed for wheezing or shortness of breath.   ANTISEPTIC ORAL RINSE (BIOTENE) LIQD    15 mLs by Mouth Rinse route as needed for dry mouth.   ASPIRIN 81 MG CHEWABLE TABLET    Chew 81 mg by mouth every morning.   CARVEDILOL (COREG) 12.5 MG TABLET    Take 12.5 mg by mouth 2 (two) times daily with a meal.   CLONIDINE (CATAPRES) 0.1 MG TABLET    Take 0.1 mg by mouth every morning.    DEXTROMETHORPHAN HBR (ROBAFEN COUGH PO)    Take 10 ml by mouth every 6 hours as needed for cough   DILTIAZEM (CARDIZEM CD) 120 MG 24 HR CAPSULE    Take 120 mg by mouth daily.    DULOXETINE (CYMBALTA) 20 MG CAPSULE    Take 20 mg by mouth daily.   FAMOTIDINE (PEPCID) 20 MG TABLET    Take 20 mg by mouth at bedtime.   FEEDING SUPPLEMENT (BOOST / RESOURCE BREEZE) LIQD    Take 1 Container by mouth 3 (three) times daily between meals.   GLYCERIN, LAXATIVE, (PEDIA-LAX) 2.8 G SUPP    Place rectally as needed (for constipation).   HYDROCODONE-ACETAMINOPHEN (NORCO/VICODIN) 5-325 MG  TABLET    Take 1 tablet by mouth every 6 (six) hours as needed for moderate pain.   LEVOTHYROXINE (SYNTHROID, LEVOTHROID) 50 MCG TABLET    Take 50 mcg by mouth every morning.    LOSARTAN (COZAAR) 50 MG TABLET    Take 50 mg by mouth daily.   MELATONIN 3 MG TBDP    Take 3 mg by mouth at bedtime.   MULTIPLE VITAMIN (MULTIVITAMIN WITH MINERALS) TABS TABLET    Take 1 tablet by mouth every morning.   POLYETHYLENE GLYCOL (MIRALAX / GLYCOLAX) PACKET    Take 17 g by mouth daily as needed.    POLYVINYL ALCOHOL (LIQUIFILM TEARS) 1.4 % OPHTHALMIC SOLUTION    Place 1 drop into both eyes 4 (four) times daily.   TIOTROPIUM (SPIRIVA) 18 MCG  INHALATION CAPSULE    Place 18 mcg into inhaler and inhale daily.  Modified Medications   No medications on file  Discontinued Medications   DULOXETINE (CYMBALTA) 30 MG CAPSULE    Take 30 mg by mouth daily. Reported on 05/07/2015    Physical Exam: Filed Vitals:   05/07/15 1411  BP: 134/60  Pulse: 76  Temp: 97.7 F (36.5 C)  TempSrc: Oral  Resp: 22  Weight: 120 lb (54.432 kg)  SpO2: 93%   Body mass index is 21.94 kg/(m^2).  Physical Exam  Constitutional: She is oriented to person, place, and time. She appears well-developed and well-nourished. No distress.  HENT:  Head: Normocephalic and atraumatic.  Right Ear: External ear normal.  Left Ear: External ear normal.  Nose: Nose normal.  Mouth/Throat: Oropharynx is clear and moist. No oropharyngeal exudate.  Eyes: Conjunctivae and EOM are normal. Pupils are equal, round, and reactive to light. Right eye exhibits no discharge. Left eye exhibits no discharge. No scleral icterus.  Neck: Normal range of motion. Neck supple. No JVD present. No tracheal deviation present. No thyromegaly present.  Cardiovascular: Normal rate, regular rhythm, normal heart sounds and intact distal pulses.   No murmur heard. Pulmonary/Chest: Effort normal. No stridor. No respiratory distress. She has decreased breath sounds. She has no  wheezes. She has rales. She exhibits no tenderness.  bibasilar  Abdominal: Soft. She exhibits no distension and no mass. There is no tenderness.  Musculoskeletal: Normal range of motion. She exhibits edema and tenderness.  Ankles and feet have trace edema. Mid back pain.  Unstable gait. Uses rolling walker.  Neurological: She is alert and oriented to person, place, and time. No cranial nerve deficit. She exhibits normal muscle tone. Coordination normal.  Left hemiparesis  Skin: Skin is warm and dry. No rash noted. She is not diaphoretic. No erythema. No pallor.  Mid abd surgical scar-healed-lower abd reducible incisional hernia-the patient has concerns of cosmetic reason but denied pain.  Left mastectomy Scalp skin lesion above the left ear.   Psychiatric: She has a normal mood and affect. Her behavior is normal. Judgment and thought content normal.  Repetitive.     Labs reviewed: Basic Metabolic Panel:  Recent Labs  12/06/14 02/04/15 03/29/15  NA 140 140 140  K 4.6 4.9 4.3  BUN 25* 20 25*  CREATININE 0.6 0.6 0.6    Liver Function Tests:  Recent Labs  07/18/14 02/04/15 03/29/15  AST 20 16 19   ALT 15 10 16   ALKPHOS 84 89 80    CBC:  Recent Labs  07/23/14 12/06/14 02/04/15 03/29/15  WBC 5.2 5.3  --  6.0  HGB 16.1* 15.0 14.8 15.4  HCT 48* 43 46 45  PLT 367 252 321 379    Lab Results  Component Value Date   TSH 2.30 02/04/2015   No results found for: HGBA1C Lab Results  Component Value Date   CHOL 121 11/22/2012   HDL 40 11/22/2012   LDLCALC 65 11/22/2012   TRIG 81 11/22/2012    Significant Diagnostic Results since last visit: none  Patient Care Team: Estill Dooms, MD as PCP - General (Internal Medicine) Gaynelle Arabian, MD as Consulting Physician (Orthopedic Surgery) Jakiah Goree X, NP as Nurse Practitioner (Nurse Practitioner) [place] ([place] [facility] [place])  Assessment/Plan Problem List Items Addressed This Visit    HTN  (hypertension) - Primary (Chronic)    Controlled, continue Clonidine 0.58mf, Carvedilol 12.5mg  bid, Diltiazem 120mg , Losartan 50mg  daily, monitor  Bp      Hypothyroidism (Chronic)    TSH 2.599 12/06/14, 2.295 02/04/15. Continue Levothyroxine 28mcg daily      GERD (gastroesophageal reflux disease) (Chronic)    Stable, continue Famotidine 20mg  daily.       COPD (chronic obstructive pulmonary disease) ([place])    CXR 12/14/14 ruled out PNA, last treated with  7 day course of Augmentin 875mg  q12h Continue Spiriva daily, prn Ventolin 2 puffs q6h Congestive cough, yellow phlegm, malaise 2-3 days, denied chest pain, no O2 desaturation, afebrile, will obtain CXR, CBC, CMP, Augmentin 875mg  q12hr x7 days      Constipation    Controlled. Continue MiraLax bid      Depression    mood is stabilized, switched from Zoloft to Cymbalta in setting of back pain, reduced Cymbalta 20mg  daily due to c/o feeling shaky-improved. Observe.       Relevant Medications   DULoxetine (CYMBALTA) 20 MG capsule   Back pain, thoracic    Pain is better controlled, continue Norco prn, Cymbalta 20mg       Relevant Medications   acetaminophen (TYLENOL) 325 MG tablet   Fatigue    Worse in the past 2-3 days since the onset of congestive cough. Observe.       Acute bronchitis    C/o congestive cough, yellow phlegm, stayed in bed x3 days, running nose, afebrile, no O2 desaturation, denied chest pain. Augmentin 875mg  q12h x 7 days, CBC, CMP, CXR           Family/ staff Communication: continue SNF for care needs.   Labs/tests ordered: CBC, CMP, CXR  [facility] Hadrian Yarbrough NP [facility] [address], [place] [address] On Call:  [phone] & follow prompts after 5pm & weekends Office Phone:  [phone] Office Fax:  [phone]

## 2015-05-07 NOTE — Assessment & Plan Note (Signed)
Controlled, continue Clonidine 0.1mf, Carvedilol 12.5mg bid, Diltiazem 120mg, Losartan 50mg daily, monitor Bp 

## 2015-05-08 DIAGNOSIS — R05 Cough: Secondary | ICD-10-CM | POA: Diagnosis not present

## 2015-05-28 ENCOUNTER — Non-Acute Institutional Stay (SKILLED_NURSING_FACILITY): Payer: Medicare Other | Admitting: Nurse Practitioner

## 2015-05-28 ENCOUNTER — Encounter: Payer: Self-pay | Admitting: Nurse Practitioner

## 2015-05-28 DIAGNOSIS — K5900 Constipation, unspecified: Secondary | ICD-10-CM | POA: Diagnosis not present

## 2015-05-28 DIAGNOSIS — M546 Pain in thoracic spine: Secondary | ICD-10-CM

## 2015-05-28 DIAGNOSIS — F329 Major depressive disorder, single episode, unspecified: Secondary | ICD-10-CM | POA: Diagnosis not present

## 2015-05-28 DIAGNOSIS — E039 Hypothyroidism, unspecified: Secondary | ICD-10-CM | POA: Diagnosis not present

## 2015-05-28 DIAGNOSIS — I10 Essential (primary) hypertension: Secondary | ICD-10-CM | POA: Diagnosis not present

## 2015-05-28 DIAGNOSIS — K219 Gastro-esophageal reflux disease without esophagitis: Secondary | ICD-10-CM | POA: Diagnosis not present

## 2015-05-28 DIAGNOSIS — F32A Depression, unspecified: Secondary | ICD-10-CM

## 2015-05-28 DIAGNOSIS — J410 Simple chronic bronchitis: Secondary | ICD-10-CM | POA: Diagnosis not present

## 2015-05-28 NOTE — Assessment & Plan Note (Signed)
Stable, continue Famotidine 20mg daily.   

## 2015-05-28 NOTE — Assessment & Plan Note (Signed)
Controlled. Continue MiraLax bid 

## 2015-05-28 NOTE — Assessment & Plan Note (Signed)
mood is stabilized, switched from Zoloft to Cymbalta in setting of back pain, reduced Cymbalta 20mg daily due to c/o feeling shaky-improved. Observe. 

## 2015-05-28 NOTE — Assessment & Plan Note (Signed)
TSH 2.599 12/06/14, 2.295 02/04/15. Continue Levothyroxine 36mcg daily

## 2015-05-28 NOTE — Assessment & Plan Note (Signed)
Controlled, continue Clonidine 0.1mf, Carvedilol 12.5mg bid, Diltiazem 120mg, Losartan 50mg daily, monitor Bp 

## 2015-05-28 NOTE — Assessment & Plan Note (Signed)
Pain is better controlled, continue Norco prn, Cymbalta 20mg  

## 2015-05-28 NOTE — Assessment & Plan Note (Deleted)
Stable, continue Famotidine 20mg daily.   

## 2015-05-28 NOTE — Assessment & Plan Note (Signed)
CXR 12/14/14 ruled out PNA, last treated with  7 day course of Augmentin 875mg  q12h Continue Spiriva daily, prn Ventolin 2 puffs q6h 3 weeks ago. Treated Congestive cough, yellow phlegm, malaise 2-3 days, denied chest pain, no O2 desaturation, afebrile, will obtain CXR, CBC, CMP, Augmentin 875mg  q12hr x7 days 05/28/15 stable.

## 2015-05-28 NOTE — Progress Notes (Signed)
Patient ID: Dana George, female   DOB: [birth year], 82 y.o.   MRN: [id number]  Location:  [facility] Room Number: 10 Place of Service:  SNF (31) Provider: Lennie Odor Mast NP  GREEN, Viviann Spare, MD  Patient Care Team: Estill Dooms, MD as PCP - General (Internal Medicine) Gaynelle Arabian, MD as Consulting Physician (Orthopedic Surgery) Man Mast X, NP as Nurse Practitioner (Nurse Practitioner) [place] ([facility] and [place])  Extended Emergency Contact Information Primary Emergency Contact: Johnston,Betsy Address: [place], [place] Phone: [phone] Relation: Daughter Secondary Emergency Contact: Rey,Lindsey  [place] Phone: [phone] Relation: Daughter  Code Status:  DNR Goals of care: Advanced Directive information Advanced Directives 05/28/2015  Does patient have an advance directive? Yes  Type of Paramedic of [place];Out of facility DNR (pink MOST or yellow form)  Does patient want to make changes to advanced directive? No - Patient declined  Copy of advanced directive(s) in chart? Yes     Chief Complaint  Patient presents with  . Medical Management of Chronic Issues    Routine Visit    HPI:  Pt is a 82 y.o. female seen today for medical management of chronic diseases.  Hx of hypothyroidism, takes Levothyroxine 71mcg, last TSH 2.295 02/04/15. Blood pressure has been controlled while on Clonidine 0.1mg , Diltiazem 120mg , Losartan 50mg , Carvedilol 12.5mg  bid, arthrotic pain is controlled on Norco prn, scheduled Norco made her too sleepy,  asymptomatic of GERD or constipation.    Past Medical History  Diagnosis Date  . Hypothyroidism   . Hypertension   . Shortness of breath   . COPD (chronic obstructive pulmonary disease) ([place])   . Osteoporosis   . Cancer ([place]) 1980    Breast cancer  . TIA (transient ischemic  attack)   . Incisional hernia, without obstruction or gangrene 05/27/2014    Lower abd from previous surgery-2-3 small incisional hernias which are reducible-no incarceration or pain complained.    . Insomnia 11/18/2012  . Thoracic spine fracture [facility]) 12/18/2013    01/05/14 T 10 Dr. Newman Pies: PCP managing pain.  F/u 2 months(suggested that if the pain is "not too bad" and seems to be improving that she "live with it" and give a change to heal without intervention.  12/18/13 X-ray Thoracic spine: Compression deformity at the level of T10. Comparing back to a chest x-ray from 12/09/2013, this appears new in the interval.       . Back pain, thoracic 01/12/2014  . Left hemiparesis ([place]) 06/08/2014  . CVA (cerebral infarction) 06/08/2011    Following abdominal surgery for bowel obstruction   . Granuloma annulare 06/08/2014  . Constipation 11/18/2012  . Depression 12/26/2013  . DVT (deep venous thrombosis) ([place]) 11/18/2012  . Fall 02/07/2013  . GERD (gastroesophageal reflux disease) 03/30/2014  . HTN (hypertension) 11/18/2012    01/15/14 Bun/creat 14/0.60   . Ventral hernia 06/08/2014   Past Surgical History  Procedure Laterality Date  . Abdominal hysterectomy    . Total knee arthroplasty  2010    left knee  . Mastectomy  1980    left side  . Tonsillectomy    . Exploratory laparotomy w/ bowel resection      10/19/12-11/01/12 [facility] for exploratory laparotomty with extensive lysis of adhesion and segmental small bowel resection with end to end anastomosis per Dr.  Noyes 10/22/12  . Cataract extraction, bilateral      Allergies  Allergen Reactions  . Cortisone       Medication List       This list is accurate as of: 05/28/15  1:10 PM.  Always use your most recent med list.               acetaminophen 325 MG tablet  Commonly known as:  TYLENOL  Take 650 mg by mouth every 4 (four) hours as needed for moderate pain.     antiseptic oral rinse Liqd  15 mLs by Mouth Rinse route as  needed for dry mouth.     aspirin 81 MG chewable tablet  Chew 81 mg by mouth every morning.     carvedilol 12.5 MG tablet  Commonly known as:  COREG  Take 12.5 mg by mouth 2 (two) times daily with a meal.     cloNIDine 0.1 MG tablet  Commonly known as:  CATAPRES  Take 0.1 mg by mouth every morning.     diltiazem 120 MG 24 hr capsule  Commonly known as:  CARDIZEM CD  Take 120 mg by mouth daily.     DULoxetine 20 MG capsule  Commonly known as:  CYMBALTA  Take 20 mg by mouth daily.     famotidine 20 MG tablet  Commonly known as:  PEPCID  Take 20 mg by mouth at bedtime.     feeding supplement Liqd  Take 1 Container by mouth 3 (three) times daily between meals.     HYDROcodone-acetaminophen 5-325 MG tablet  Commonly known as:  NORCO/VICODIN  Take 1 tablet by mouth every 6 (six) hours as needed for moderate pain.     levothyroxine 50 MCG tablet  Commonly known as:  SYNTHROID, LEVOTHROID  Take 50 mcg by mouth every morning.     losartan 50 MG tablet  Commonly known as:  COZAAR  Take 50 mg by mouth daily.     Melatonin 3 MG Tbdp  Take 3 mg by mouth at bedtime.     multivitamin with minerals Tabs tablet  Take 1 tablet by mouth every morning.     PEDIA-LAX 2.8 g Supp  Generic drug:  Glycerin (Laxative)  Place rectally as needed (for constipation).     polyethylene glycol packet  Commonly known as:  MIRALAX / GLYCOLAX  Take 17 g by mouth daily as needed.     polyvinyl alcohol 1.4 % ophthalmic solution  Commonly known as:  LIQUIFILM TEARS  Place 1 drop into both eyes 4 (four) times daily.     ROBAFEN COUGH PO  Take 10 ml by mouth every 6 hours as needed for cough     tiotropium 18 MCG inhalation capsule  Commonly known as:  SPIRIVA  Place 18 mcg into inhaler and inhale daily.     VENTOLIN HFA 108 (90 Base) MCG/ACT inhaler  Generic drug:  albuterol  Inhale 2 puffs into the lungs every 6 (six) hours as needed for wheezing or shortness of breath.        Review  of Systems  Constitutional: Negative for fever, chills and diaphoresis.       Generalized weakness.   HENT: Positive for hearing loss. Negative for congestion, ear discharge and ear pain.   Eyes: Negative for pain, discharge and redness.  Respiratory: Positive for cough. Negative for shortness of breath, wheezing and stridor.   Cardiovascular: Positive for leg swelling. Negative for chest pain and palpitations.  Trace in ankles.   Gastrointestinal: Negative for nausea, vomiting, abdominal pain, diarrhea and constipation.       Lower abd incisional hernia.    Genitourinary: Positive for frequency. Negative for dysuria, urgency, hematuria and flank pain.       Q2-3 hours average.   Musculoskeletal: Positive for back pain. Negative for myalgias and neck pain.       Improved back pain. She is able to ambulates with walker on unit.  Resolution of Left hip pain since 11/25/14.  Skin: Negative for rash.       Lower abd incisional hernia-reducible.  Neurological: Positive for weakness. Negative for dizziness, tremors, seizures and headaches.  Psychiatric/Behavioral: Negative for suicidal ideas and hallucinations. The patient is not nervous/anxious.        C/o sleeps too much.    Immunization History  Administered Date(s) Administered  . Influenza,inj,Quad PF,36+ Mos 12/19/2013  . Influenza-Unspecified 01/21/2013, 12/28/2014  . PPD Test 01/02/2014  . Pneumococcal Polysaccharide-23 11/24/1999  . Td 12/28/1994  . Tdap 04/08/2013   Pertinent  Health Maintenance Due  Topic Date Due  . DEXA SCAN  05/24/1990  . PNA vac Low Risk Adult (2 of 2 - PCV13) 11/23/2000  . INFLUENZA VACCINE  10/22/2015   Fall Risk  12/03/2014 06/08/2014  Falls in the past year? Yes No  Number falls in past yr: 1 -  Injury with Fall? No -  Risk for fall due to : History of fall(s);Impaired balance/gait;Impaired mobility Impaired balance/gait  Follow up Falls evaluation completed -   Functional Status Survey:      Filed Vitals:   05/28/15 0937  BP: 120/72  Pulse: 80  Temp: 98 F (36.7 C)  TempSrc: Oral  Resp: 20  Height: 5\' 2"  (1.575 m)  Weight: 119 lb (53.978 kg)   Body mass index is 21.76 kg/(m^2). Physical Exam  Constitutional: She is oriented to person, place, and time. She appears well-developed and well-nourished. No distress.  HENT:  Head: Normocephalic and atraumatic.  Right Ear: External ear normal.  Left Ear: External ear normal.  Nose: Nose normal.  Mouth/Throat: Oropharynx is clear and moist. No oropharyngeal exudate.  Eyes: Conjunctivae and EOM are normal. Pupils are equal, round, and reactive to light. Right eye exhibits no discharge. Left eye exhibits no discharge. No scleral icterus.  Neck: Normal range of motion. Neck supple. No JVD present. No tracheal deviation present. No thyromegaly present.  Cardiovascular: Normal rate, regular rhythm, normal heart sounds and intact distal pulses.   No murmur heard. Pulmonary/Chest: Effort normal. No stridor. No respiratory distress. She has decreased breath sounds. She has no wheezes. She has rales. She exhibits no tenderness.  bibasilar  Abdominal: Soft. She exhibits no distension and no mass. There is no tenderness.  Musculoskeletal: Normal range of motion. She exhibits edema and tenderness.  Ankles and feet have trace edema. Mid back pain.  Unstable gait. Uses rolling walker.  Neurological: She is alert and oriented to person, place, and time. No cranial nerve deficit. She exhibits normal muscle tone. Coordination normal.  Left hemiparesis  Skin: Skin is warm and dry. No rash noted. She is not diaphoretic. No erythema. No pallor.  Mid abd surgical scar-healed-lower abd reducible incisional hernia-the patient has concerns of cosmetic reason but denied pain.  Left mastectomy Scalp skin lesion above the left ear.   Psychiatric: She has a normal mood and affect. Her behavior is normal. Judgment and thought content normal.   Repetitive.     Labs reviewed:  Recent Labs  02/04/15 03/29/15 05/07/15  NA 140 140 136*  K 4.9 4.3 4.3  BUN 20 25* 25*  CREATININE 0.6 0.6 0.7    Recent Labs  02/04/15 03/29/15 05/07/15  AST 16 19 18   ALT 10 16 14   ALKPHOS 89 80 108    Recent Labs  12/06/14 02/04/15 03/29/15 05/07/15  WBC 5.3  --  6.0 6.7  HGB 15.0 14.8 15.4 15.2  HCT 43 46 45 46  PLT 252 321 379 352   Lab Results  Component Value Date   TSH 2.30 02/04/2015   No results found for: HGBA1C Lab Results  Component Value Date   CHOL 121 11/22/2012   HDL 40 11/22/2012   LDLCALC 65 11/22/2012   TRIG 81 11/22/2012    Significant Diagnostic Results in last 30 days:  No results found.  Assessment/Plan Back pain, thoracic Pain is better controlled, continue Norco prn, Cymbalta 20mg   Constipation Controlled. Continue MiraLax bid  COPD (chronic obstructive pulmonary disease) CXR 12/14/14 ruled out PNA, last treated with  7 day course of Augmentin 875mg  q12h Continue Spiriva daily, prn Ventolin 2 puffs q6h 3 weeks ago. Treated Congestive cough, yellow phlegm, malaise 2-3 days, denied chest pain, no O2 desaturation, afebrile, will obtain CXR, CBC, CMP, Augmentin 875mg  q12hr x7 days 05/28/15 stable.   Depression mood is stabilized, switched from Zoloft to Cymbalta in setting of back pain, reduced Cymbalta 20mg  daily due to c/o feeling shaky-improved. Observe.  Fatigue Stable, continue Famotidine 20mg  daily  HTN (hypertension) Controlled, continue Clonidine 0.45mf, Carvedilol 12.5mg  bid, Diltiazem 120mg , Losartan 50mg  daily, monitor Bp  Hypothyroidism TSH 2.599 12/06/14, 2.295 02/04/15. Continue Levothyroxine 68mcg daily    Family/ staff Communication: SNF for care needs.   Labs/tests ordered: none

## 2015-06-04 DIAGNOSIS — R531 Weakness: Secondary | ICD-10-CM | POA: Diagnosis not present

## 2015-06-25 ENCOUNTER — Encounter: Payer: Self-pay | Admitting: Nurse Practitioner

## 2015-06-25 ENCOUNTER — Non-Acute Institutional Stay (SKILLED_NURSING_FACILITY): Payer: Medicare Other | Admitting: Nurse Practitioner

## 2015-06-25 DIAGNOSIS — R5382 Chronic fatigue, unspecified: Secondary | ICD-10-CM

## 2015-06-25 DIAGNOSIS — J410 Simple chronic bronchitis: Secondary | ICD-10-CM | POA: Diagnosis not present

## 2015-06-25 DIAGNOSIS — M546 Pain in thoracic spine: Secondary | ICD-10-CM | POA: Diagnosis not present

## 2015-06-25 DIAGNOSIS — E039 Hypothyroidism, unspecified: Secondary | ICD-10-CM | POA: Diagnosis not present

## 2015-06-25 DIAGNOSIS — K5900 Constipation, unspecified: Secondary | ICD-10-CM

## 2015-06-25 DIAGNOSIS — K219 Gastro-esophageal reflux disease without esophagitis: Secondary | ICD-10-CM

## 2015-06-25 DIAGNOSIS — F329 Major depressive disorder, single episode, unspecified: Secondary | ICD-10-CM | POA: Diagnosis not present

## 2015-06-25 DIAGNOSIS — I10 Essential (primary) hypertension: Secondary | ICD-10-CM

## 2015-06-25 DIAGNOSIS — F32A Depression, unspecified: Secondary | ICD-10-CM

## 2015-06-25 NOTE — Assessment & Plan Note (Signed)
mood is stabilized, switched from Zoloft to Cymbalta in setting of back pain, reduced Cymbalta 20mg daily due to c/o feeling shaky-improved. Observe. 

## 2015-06-25 NOTE — Assessment & Plan Note (Signed)
Controlled. Continue MiraLax bid 

## 2015-06-25 NOTE — Assessment & Plan Note (Signed)
CXR 12/14/14 ruled out PNA, last treated with  7 day course of Augmentin 875mg  q12h Continue Spiriva daily, prn Ventolin 2 puffs q6h 3 weeks ago. Treated Congestive cough, yellow phlegm, malaise 2-3 days, denied chest pain, no O2 desaturation, afebrile, will obtain CXR, CBC, CMP, Augmentin 875mg  q12hr x7 days stable. Chronic hacking cough occasionally.

## 2015-06-25 NOTE — Progress Notes (Signed)
Patient ID: Dana George, female   DOB: [birth year], 82 y.o.   MRN: [id number]  Location:  [place] Room Number: 10 Place of Service:  SNF (31) Provider: Lennie Odor Edison Wollschlager NP  GREEN, Viviann Spare, MD  Patient Care Team: Estill Dooms, MD as PCP - General (Internal Medicine) Gaynelle Arabian, MD as Consulting Physician (Orthopedic Surgery) Dwane Andres X, NP as Nurse Practitioner (Nurse Practitioner) [place] (Skilled Nursing and [place])  Extended Emergency Contact Information Primary Emergency Contact: Johnston,Betsy Address: [place], [place] Phone: [phone] Relation: Daughter Secondary Emergency Contact: Rey,Lindsey  [place] Phone: [phone] Relation: Daughter  Code Status:  DNR Goals of care: Advanced Directive information Advanced Directives 06/25/2015  Does patient have an advance directive? Yes  Type of Paramedic of [place];Out of facility DNR (pink MOST or yellow form)  Does patient want to make changes to advanced directive? No - Patient declined  Copy of advanced directive(s) in chart? Yes     Chief Complaint  Patient presents with  . Medical Management of Chronic Issues    Routine Visit    HPI:  Pt is a 82 y.o. female seen today for medical management of chronic diseases.  Hx of hypothyroidism, takes Levothyroxine 39mcg, last TSH 2.295 02/04/15. Blood pressure has been controlled while on Clonidine 0.1mg , Diltiazem 120mg , Losartan 50mg , Carvedilol 12.5mg  bid, arthrotic pain is controlled on Norco prn, scheduled Norco made her too sleepy,  asymptomatic of GERD or constipation.    Past Medical History  Diagnosis Date  . Hypothyroidism   . Hypertension   . Shortness of breath   . COPD (chronic obstructive pulmonary disease) ([place])   . Osteoporosis   . Cancer ([place]) 1980    Breast cancer  . TIA (transient ischemic  attack)   . Incisional hernia, without obstruction or gangrene 05/27/2014    Lower abd from previous surgery-2-3 small incisional hernias which are reducible-no incarceration or pain complained.    . Insomnia 11/18/2012  . Thoracic spine fracture [facility]) 12/18/2013    01/05/14 T 10 Dr. Newman Pies: PCP managing pain.  F/u 2 months(suggested that if the pain is "not too bad" and seems to be improving that she "live with it" and give a change to heal without intervention.  12/18/13 X-ray Thoracic spine: Compression deformity at the level of T10. Comparing back to a chest x-ray from 12/09/2013, this appears new in the interval.       . Back pain, thoracic 01/12/2014  . Left hemiparesis ([place]) 06/08/2014  . CVA (cerebral infarction) 06/08/2011    Following abdominal surgery for bowel obstruction   . Granuloma annulare 06/08/2014  . Constipation 11/18/2012  . Depression 12/26/2013  . DVT (deep venous thrombosis) ([place]) 11/18/2012  . Fall 02/07/2013  . GERD (gastroesophageal reflux disease) 03/30/2014  . HTN (hypertension) 11/18/2012    01/15/14 Bun/creat 14/0.60   . Ventral hernia 06/08/2014   Past Surgical History  Procedure Laterality Date  . Abdominal hysterectomy    . Total knee arthroplasty  2010    left knee  . Mastectomy  1980    left side  . Tonsillectomy    . Exploratory laparotomy w/ bowel resection      10/19/12-11/01/12 [facility] for exploratory laparotomty with extensive lysis of adhesion and segmental small bowel resection with end to end anastomosis per Dr.  Noyes 10/22/12  . Cataract extraction, bilateral      Allergies  Allergen Reactions  . Cortisone       Medication List       This list is accurate as of: 06/25/15  3:33 PM.  Always use your most recent med list.               acetaminophen 325 MG tablet  Commonly known as:  TYLENOL  Take 650 mg by mouth every 4 (four) hours as needed for moderate pain.     antiseptic oral rinse Liqd  15 mLs by Mouth Rinse route as  needed for dry mouth.     aspirin 81 MG chewable tablet  Chew 81 mg by mouth every morning.     carvedilol 12.5 MG tablet  Commonly known as:  COREG  Take 12.5 mg by mouth 2 (two) times daily with a meal.     cloNIDine 0.1 MG tablet  Commonly known as:  CATAPRES  Take 0.1 mg by mouth every morning.     diltiazem 120 MG 24 hr capsule  Commonly known as:  CARDIZEM CD  Take 120 mg by mouth daily.     DULoxetine 20 MG capsule  Commonly known as:  CYMBALTA  Take 20 mg by mouth daily.     famotidine 20 MG tablet  Commonly known as:  PEPCID  Take 20 mg by mouth at bedtime.     feeding supplement Liqd  Take 1 Container by mouth daily as needed.     RESOURCE 2.0 PO  Take 120 mLs by mouth 2 (two) times daily.     HYDROcodone-acetaminophen 5-325 MG tablet  Commonly known as:  NORCO/VICODIN  Take 1 tablet by mouth every 6 (six) hours as needed for moderate pain.     levothyroxine 50 MCG tablet  Commonly known as:  SYNTHROID, LEVOTHROID  Take 50 mcg by mouth every morning.     losartan 50 MG tablet  Commonly known as:  COZAAR  Take 50 mg by mouth daily.     Melatonin 3 MG Tbdp  Take 3 mg by mouth at bedtime.     multivitamin with minerals Tabs tablet  Take 1 tablet by mouth every morning.     PEDIA-LAX 2.8 g Supp  Generic drug:  Glycerin (Laxative)  Place rectally as needed (for constipation).     polyethylene glycol packet  Commonly known as:  MIRALAX / GLYCOLAX  Take 17 g by mouth daily as needed.     polyvinyl alcohol 1.4 % ophthalmic solution  Commonly known as:  LIQUIFILM TEARS  Place 1 drop into both eyes 4 (four) times daily.     ROBAFEN COUGH PO  Take 10 ml by mouth every 6 hours as needed for cough     tiotropium 18 MCG inhalation capsule  Commonly known as:  SPIRIVA  Place 18 mcg into inhaler and inhale daily.     VENTOLIN HFA 108 (90 Base) MCG/ACT inhaler  Generic drug:  albuterol  Inhale 2 puffs into the lungs every 6 (six) hours as needed for  wheezing or shortness of breath.        Review of Systems  Constitutional: Negative for fever, chills and diaphoresis.       Generalized weakness.   HENT: Positive for hearing loss. Negative for congestion, ear discharge and ear pain.   Eyes: Negative for pain, discharge and redness.  Respiratory: Positive for cough. Negative for shortness of breath, wheezing and stridor.  Chronic hacking cough occasionally  Cardiovascular: Positive for leg swelling. Negative for chest pain and palpitations.       Trace in ankles.   Gastrointestinal: Negative for nausea, vomiting, abdominal pain, diarrhea and constipation.       Lower abd incisional hernia.    Genitourinary: Positive for frequency. Negative for dysuria, urgency, hematuria and flank pain.       Q2-3 hours average.   Musculoskeletal: Positive for back pain. Negative for myalgias and neck pain.       Improved back pain. She is able to ambulates with walker on unit.  Resolution of Left hip pain since 11/25/14.  Skin: Negative for rash.       Lower abd incisional hernia-reducible.  Neurological: Positive for weakness. Negative for dizziness, tremors, seizures and headaches.  Psychiatric/Behavioral: Negative for suicidal ideas and hallucinations. The patient is not nervous/anxious.        C/o sleeps too much.    Immunization History  Administered Date(s) Administered  . Influenza,inj,Quad PF,36+ Mos 12/19/2013  . Influenza-Unspecified 01/21/2013, 12/28/2014  . PPD Test 01/02/2014  . Pneumococcal Polysaccharide-23 11/24/1999  . Td 12/28/1994  . Tdap 04/08/2013   Pertinent  Health Maintenance Due  Topic Date Due  . DEXA SCAN  05/24/1990  . PNA vac Low Risk Adult (2 of 2 - PCV13) 11/23/2000  . INFLUENZA VACCINE  10/22/2015   Fall Risk  12/03/2014 06/08/2014  Falls in the past year? Yes No  Number falls in past yr: 1 -  Injury with Fall? No -  Risk for fall due to : History of fall(s);Impaired balance/gait;Impaired mobility  Impaired balance/gait  Follow up Falls evaluation completed -   Functional Status Survey:    Filed Vitals:   06/25/15 1118  BP: 150/70  Pulse: 84  Temp: 98.4 F (36.9 C)  TempSrc: Oral  Resp: 20  Height: 5\' 2"  (1.575 m)  Weight: 119 lb (53.978 kg)  SpO2: 94%   Body mass index is 21.76 kg/(m^2). Physical Exam  Constitutional: She is oriented to person, place, and time. She appears well-developed and well-nourished. No distress.  HENT:  Head: Normocephalic and atraumatic.  Right Ear: External ear normal.  Left Ear: External ear normal.  Nose: Nose normal.  Mouth/Throat: Oropharynx is clear and moist. No oropharyngeal exudate.  Eyes: Conjunctivae and EOM are normal. Pupils are equal, round, and reactive to light. Right eye exhibits no discharge. Left eye exhibits no discharge. No scleral icterus.  Neck: Normal range of motion. Neck supple. No JVD present. No tracheal deviation present. No thyromegaly present.  Cardiovascular: Normal rate, regular rhythm, normal heart sounds and intact distal pulses.   No murmur heard. Pulmonary/Chest: Effort normal. No stridor. No respiratory distress. She has decreased breath sounds. She has no wheezes. She has rales. She exhibits no tenderness.  bibasilar  Abdominal: Soft. She exhibits no distension and no mass. There is no tenderness.  Musculoskeletal: Normal range of motion. She exhibits edema and tenderness.  Ankles and feet have trace edema. Mid back pain.  Unstable gait. Uses rolling walker.  Neurological: She is alert and oriented to person, place, and time. No cranial nerve deficit. She exhibits normal muscle tone. Coordination normal.  Left hemiparesis  Skin: Skin is warm and dry. No rash noted. She is not diaphoretic. No erythema. No pallor.  Mid abd surgical scar-healed-lower abd reducible incisional hernia-the patient has concerns of cosmetic reason but denied pain.  Left mastectomy Scalp skin lesion above the left ear.  Psychiatric: She has a normal mood and affect. Her behavior is normal. Judgment and thought content normal.  Repetitive.     Labs reviewed:  Recent Labs  02/04/15 03/29/15 05/07/15  NA 140 140 136*  K 4.9 4.3 4.3  BUN 20 25* 25*  CREATININE 0.6 0.6 0.7    Recent Labs  02/04/15 03/29/15 05/07/15  AST 16 19 18   ALT 10 16 14   ALKPHOS 89 80 108    Recent Labs  12/06/14 02/04/15 03/29/15 05/07/15  WBC 5.3  --  6.0 6.7  HGB 15.0 14.8 15.4 15.2  HCT 43 46 45 46  PLT 252 321 379 352   Lab Results  Component Value Date   TSH 2.30 02/04/2015   No results found for: HGBA1C Lab Results  Component Value Date   CHOL 121 11/22/2012   HDL 40 11/22/2012   LDLCALC 65 11/22/2012   TRIG 81 11/22/2012    Significant Diagnostic Results in last 30 days:  No results found.  Assessment/Plan Back pain, thoracic Pain is better controlled, continue Norco prn, Cymbalta 20mg   Constipation Controlled. Continue MiraLax bid  COPD (chronic obstructive pulmonary disease) CXR 12/14/14 ruled out PNA, last treated with  7 day course of Augmentin 875mg  q12h Continue Spiriva daily, prn Ventolin 2 puffs q6h 3 weeks ago. Treated Congestive cough, yellow phlegm, malaise 2-3 days, denied chest pain, no O2 desaturation, afebrile, will obtain CXR, CBC, CMP, Augmentin 875mg  q12hr x7 days stable. Chronic hacking cough occasionally.    Depression mood is stabilized, switched from Zoloft to Cymbalta in setting of back pain, reduced Cymbalta 20mg  daily due to c/o feeling shaky-improved. Observe.  Fatigue In her usual state of health.   GERD (gastroesophageal reflux disease) Stable, continue Famotidine 20mg  daily.   HTN (hypertension) Controlled, continue Clonidine 0.43mf, Carvedilol 12.5mg  bid, Diltiazem 120mg , Losartan 50mg  daily, monitor Bp   Hypothyroidism TSH 2.599 12/06/14, 2.295 02/04/15. Continue Levothyroxine 66mcg daily    Family/ staff Communication: SNF for care needs.    Labs/tests ordered: none

## 2015-06-25 NOTE — Assessment & Plan Note (Signed)
Stable, continue Famotidine 20mg daily.   

## 2015-06-25 NOTE — Assessment & Plan Note (Signed)
TSH 2.599 12/06/14, 2.295 02/04/15. Continue Levothyroxine 36mcg daily

## 2015-06-25 NOTE — Assessment & Plan Note (Signed)
Pain is better controlled, continue Norco prn, Cymbalta 20mg  

## 2015-06-25 NOTE — Assessment & Plan Note (Signed)
In her usual state of health.  

## 2015-06-25 NOTE — Assessment & Plan Note (Signed)
Controlled, continue Clonidine 0.1mf, Carvedilol 12.5mg bid, Diltiazem 120mg, Losartan 50mg daily, monitor Bp 

## 2015-06-27 ENCOUNTER — Non-Acute Institutional Stay (SKILLED_NURSING_FACILITY): Payer: Medicare Other | Admitting: Internal Medicine

## 2015-06-27 ENCOUNTER — Encounter: Payer: Self-pay | Admitting: Internal Medicine

## 2015-06-27 DIAGNOSIS — E039 Hypothyroidism, unspecified: Secondary | ICD-10-CM

## 2015-06-27 DIAGNOSIS — M5440 Lumbago with sciatica, unspecified side: Secondary | ICD-10-CM

## 2015-06-27 DIAGNOSIS — F329 Major depressive disorder, single episode, unspecified: Secondary | ICD-10-CM | POA: Diagnosis not present

## 2015-06-27 DIAGNOSIS — I10 Essential (primary) hypertension: Secondary | ICD-10-CM

## 2015-06-27 DIAGNOSIS — F32A Depression, unspecified: Secondary | ICD-10-CM

## 2015-06-27 NOTE — Progress Notes (Signed)
Patient ID: Dana George, female   DOB: 12-10-1923, 82 y.o.   MRN: [id number]  Location:  [place] Room Number: 10 Place of Service:  SNF (31) Provider:  Estill Dooms, MD  Patient Care Team: Estill Dooms, MD as PCP - General (Internal Medicine) Gaynelle Arabian, MD as Consulting Physician (Orthopedic Surgery) Man Mast X, NP as Nurse Practitioner (Nurse Practitioner) [place] (Skilled Nursing and [place])  Extended Emergency Contact Information Primary Emergency Contact: Johnston,Betsy Address: [place], [place] Phone: [phone] Relation: Daughter Secondary Emergency Contact: Rey,Lindsey  [place] Phone: [phone] Relation: Daughter  Code Status:  DO NOT RESUSCITATE Goals of care: Advanced Directive information Advanced Directives 06/27/2015  Does patient have an advance directive? Yes  Type of Paramedic of [place];Living will;Out of facility DNR (pink MOST or yellow form)  Does patient want to make changes to advanced directive? -  Copy of advanced directive(s) in chart? Yes  Pre-existing out of facility DNR order (yellow form or pink MOST form) Yellow form placed in chart (order not valid for inpatient use);Pink MOST form placed in chart (order not valid for inpatient use)     Chief Complaint  Patient presents with  . Medical Management of Chronic Issues    routine    HPI:  Pt is a 82 y.o. female seen today for medical management of chronic diseases.  Patient says that her health has been explained. She has chronic back discomfort but realizes little can be done about this.  Blood pressure is under control. Hypothyroidism is compensated.   Past Medical History  Diagnosis Date  . Hypothyroidism   . Hypertension   . Shortness of breath   . COPD (chronic obstructive pulmonary disease) ([place])   . Osteoporosis   .  Cancer ([place]) 1980    Breast cancer  . TIA (transient ischemic attack)   . Incisional hernia, without obstruction or gangrene 05/27/2014    Lower abd from previous surgery-2-3 small incisional hernias which are reducible-no incarceration or pain complained.    . Insomnia 11/18/2012  . Thoracic spine fracture [facility]) 12/18/2013    01/05/14 T 10 Dr. Newman Pies: PCP managing pain.  F/u 2 months(suggested that if the pain is "not too bad" and seems to be improving that she "live with it" and give a change to heal without intervention.  12/18/13 X-ray Thoracic spine: Compression deformity at the level of T10. Comparing back to a chest x-ray from 12/09/2013, this appears new in the interval.       . Back pain, thoracic 01/12/2014  . Left hemiparesis ([place]) 06/08/2014  . CVA (cerebral infarction) 06/08/2011    Following abdominal surgery for bowel obstruction   . Granuloma annulare 06/08/2014  . Constipation 11/18/2012  . Depression 12/26/2013  . DVT (deep venous thrombosis) ([place]) 11/18/2012  . Fall 02/07/2013  . GERD (gastroesophageal reflux disease) 03/30/2014  . HTN (hypertension) 11/18/2012    01/15/14 Bun/creat 14/0.60   . Ventral hernia 06/08/2014   Past Surgical History  Procedure Laterality Date  . Abdominal hysterectomy    . Total knee arthroplasty  2010    left knee  . Mastectomy  1980    left side  . Tonsillectomy    . Exploratory laparotomy w/ bowel resection      10/19/12-11/01/12 [facility] for exploratory laparotomty with extensive  lysis of adhesion and segmental small bowel resection with end to end anastomosis per Dr. Isidore Moos 10/22/12  . Cataract extraction, bilateral      Allergies  Allergen Reactions  . Cortisone       Medication List       This list is accurate as of: 06/27/15  3:38 PM.  Always use your most recent med list.               acetaminophen 325 MG tablet  Commonly known as:  TYLENOL  Take 650 mg by mouth every 4 (four) hours as needed for moderate pain.       antiseptic oral rinse Liqd  15 mLs by Mouth Rinse route as needed for dry mouth.     aspirin 81 MG chewable tablet  Chew 81 mg by mouth every morning.     carvedilol 12.5 MG tablet  Commonly known as:  COREG  Take 12.5 mg by mouth 2 (two) times daily with a meal.     cloNIDine 0.1 MG tablet  Commonly known as:  CATAPRES  Take 0.1 mg by mouth every morning.     diltiazem 120 MG 24 hr capsule  Commonly known as:  CARDIZEM CD  Take 120 mg by mouth daily.     DULoxetine 20 MG capsule  Commonly known as:  CYMBALTA  Take 20 mg by mouth daily.     famotidine 20 MG tablet  Commonly known as:  PEPCID  Take 20 mg by mouth at bedtime.     feeding supplement Liqd  Take 1 Container by mouth daily as needed.     RESOURCE 2.0 PO  Take 120 mLs by mouth 2 (two) times daily.     HYDROcodone-acetaminophen 5-325 MG tablet  Commonly known as:  NORCO/VICODIN  Take 1 tablet by mouth every 6 (six) hours as needed for moderate pain.     levothyroxine 50 MCG tablet  Commonly known as:  SYNTHROID, LEVOTHROID  Take 50 mcg by mouth every morning.     losartan 50 MG tablet  Commonly known as:  COZAAR  Take 50 mg by mouth daily.     Melatonin 3 MG Tbdp  Take 3 mg by mouth at bedtime.     multivitamin with minerals Tabs tablet  Take 1 tablet by mouth every morning.     PEDIA-LAX 2.8 g Supp  Generic drug:  Glycerin (Laxative)  Place rectally as needed (for constipation).     polyethylene glycol packet  Commonly known as:  MIRALAX / GLYCOLAX  Take 17 g by mouth daily as needed.     polyvinyl alcohol 1.4 % ophthalmic solution  Commonly known as:  LIQUIFILM TEARS  Place 1 drop into both eyes 4 (four) times daily.     ROBAFEN COUGH PO  Take 10 ml by mouth every 6 hours as needed for cough     tiotropium 18 MCG inhalation capsule  Commonly known as:  SPIRIVA  Place 18 mcg into inhaler and inhale daily.     VENTOLIN HFA 108 (90 Base) MCG/ACT inhaler  Generic drug:  albuterol   Inhale 2 puffs into the lungs every 6 (six) hours as needed for wheezing or shortness of breath.        Review of Systems  Constitutional: Negative for fever, chills and diaphoresis.       Generalized weakness.   HENT: Positive for hearing loss. Negative for congestion, ear discharge and ear pain.   Eyes: Negative for pain, discharge  and redness.  Respiratory: Positive for cough. Negative for shortness of breath, wheezing and stridor.        Chronic hacking cough occasionally  Cardiovascular: Positive for leg swelling. Negative for chest pain and palpitations.       Trace in ankles.   Gastrointestinal: Negative for nausea, vomiting, abdominal pain, diarrhea and constipation.       Lower abd incisional hernia.    Genitourinary: Positive for frequency. Negative for dysuria, urgency, hematuria and flank pain.       Q2-3 hours average.   Musculoskeletal: Positive for back pain. Negative for myalgias and neck pain.       Improved back pain. She is able to ambulates with walker on unit.  Resolution of Left hip pain since 11/25/14.  Skin: Negative for rash.       Lower abd incisional hernia-reducible.  Neurological: Positive for weakness. Negative for dizziness, tremors, seizures and headaches.  Psychiatric/Behavioral: Negative for suicidal ideas and hallucinations. The patient is not nervous/anxious.        C/o sleeps too much.    Immunization History  Administered Date(s) Administered  . Influenza,inj,Quad PF,36+ Mos 12/19/2013  . Influenza-Unspecified 01/21/2013, 12/28/2014  . PPD Test 01/02/2014  . Pneumococcal Polysaccharide-23 11/24/1999  . Td 12/28/1994  . Tdap 04/08/2013   Pertinent  Health Maintenance Due  Topic Date Due  . DEXA SCAN  05/24/1990  . PNA vac Low Risk Adult (2 of 2 - PCV13) 11/23/2000  . INFLUENZA VACCINE  10/22/2015   Fall Risk  12/03/2014 06/08/2014  Falls in the past year? Yes No  Number falls in past yr: 1 -  Injury with Fall? No -  Risk for fall due  to : History of fall(s);Impaired balance/gait;Impaired mobility Impaired balance/gait  Follow up Falls evaluation completed -   Functional Status Survey:    Filed Vitals:   06/27/15 1530  BP: 134/70  Pulse: 76  Temp: 98.7 F (37.1 C)  Resp: 18  Height: 5\' 2"  (1.575 m)  Weight: 119 lb (53.978 kg)  SpO2: 94%   Body mass index is 21.76 kg/(m^2). Physical Exam  Constitutional: She is oriented to person, place, and time. She appears well-developed and well-nourished. No distress.  HENT:  Head: Normocephalic and atraumatic.  Right Ear: External ear normal.  Left Ear: External ear normal.  Nose: Nose normal.  Mouth/Throat: Oropharynx is clear and moist. No oropharyngeal exudate.  Eyes: Conjunctivae and EOM are normal. Pupils are equal, round, and reactive to light. Right eye exhibits no discharge. Left eye exhibits no discharge. No scleral icterus.  Neck: Normal range of motion. Neck supple. No JVD present. No tracheal deviation present. No thyromegaly present.  Cardiovascular: Normal rate, regular rhythm, normal heart sounds and intact distal pulses.   No murmur heard. Pulmonary/Chest: Effort normal. No stridor. No respiratory distress. She has decreased breath sounds. She has no wheezes. She has rales. She exhibits no tenderness.  bibasilar  Abdominal: Soft. She exhibits no distension and no mass. There is no tenderness.  Musculoskeletal: Normal range of motion. She exhibits edema and tenderness.  Ankles and feet have trace edema. Mid back pain.  Unstable gait. Uses rolling walker.  Neurological: She is alert and oriented to person, place, and time. No cranial nerve deficit. She exhibits normal muscle tone. Coordination normal.  Left hemiparesis  Skin: Skin is warm and dry. No rash noted. She is not diaphoretic. No erythema. No pallor.  Mid abd surgical scar-healed-lower abd reducible incisional hernia-the patient has concerns of  cosmetic reason but denied pain.  Left  mastectomy Scalp skin lesion above the left ear.   Psychiatric: She has a normal mood and affect. Her behavior is normal. Judgment and thought content normal.  Repetitive.     Labs reviewed:  Recent Labs  02/04/15 03/29/15 05/07/15  NA 140 140 136*  K 4.9 4.3 4.3  BUN 20 25* 25*  CREATININE 0.6 0.6 0.7    Recent Labs  02/04/15 03/29/15 05/07/15  AST 16 19 18   ALT 10 16 14   ALKPHOS 89 80 108    Recent Labs  12/06/14 02/04/15 03/29/15 05/07/15  WBC 5.3  --  6.0 6.7  HGB 15.0 14.8 15.4 15.2  HCT 43 46 45 46  PLT 252 321 379 352   Lab Results  Component Value Date   TSH 2.30 02/04/2015   No results found for: HGBA1C Lab Results  Component Value Date   CHOL 121 11/22/2012   HDL 40 11/22/2012   LDLCALC 65 11/22/2012   TRIG 81 11/22/2012    Significant Diagnostic Results in last 30 days:  No results found.  Assessment/Plan 1. Midline low back pain with sciatica, sciatica laterality unspecified Continue current pain medications  2. Essential hypertension Controlled on current medication  3. Depression Controlled with Cymbalta  4. Hypothyroidism, unspecified hypothyroidism type Controlled on current dose levothyroxin

## 2015-07-10 IMAGING — CR DG LUMBAR SPINE COMPLETE 4+V
5 series · 5 of 5 positions shown · non-contrast
Comparison: None.

CLINICAL DATA: Fall with back pain.

EXAM:
LUMBAR SPINE - COMPLETE 4+ VIEW

[t lumbar spine ap]
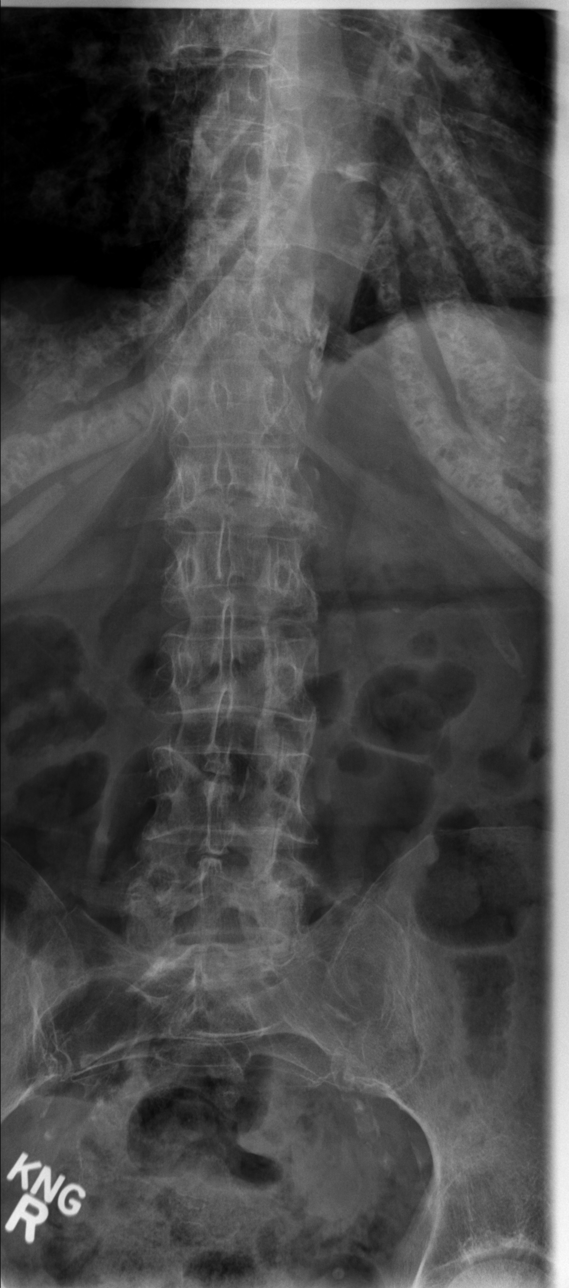

[t lumbar spine obl (1 of 2)]
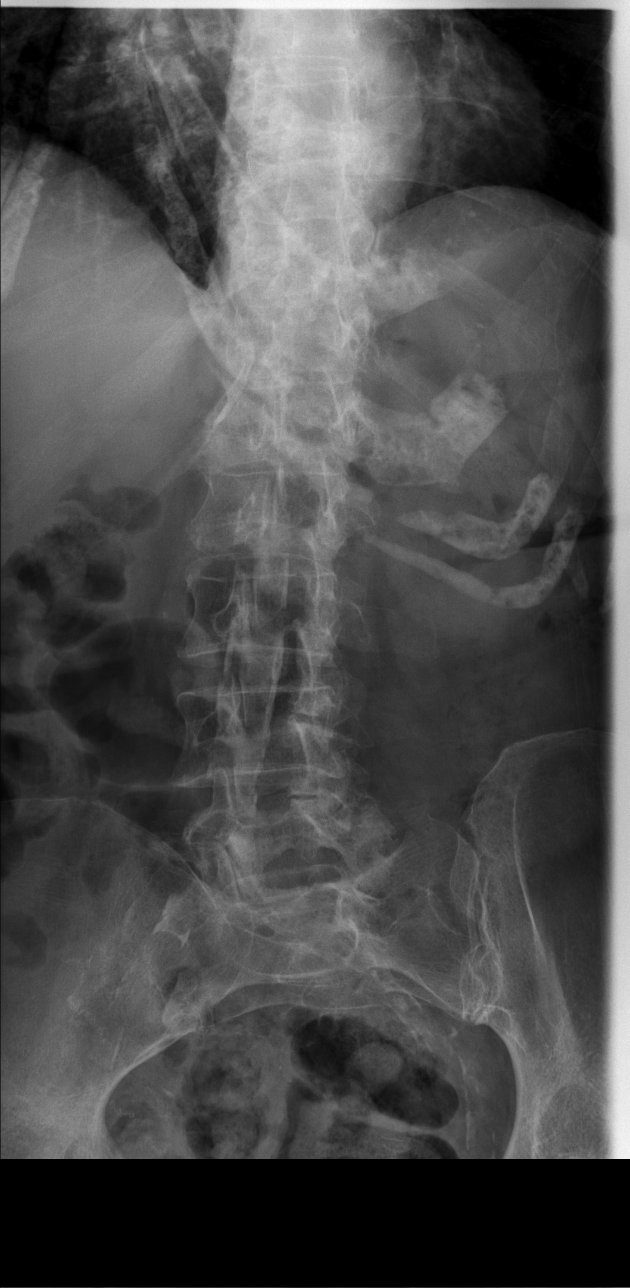

[t lumbar spine obl (2 of 2)]
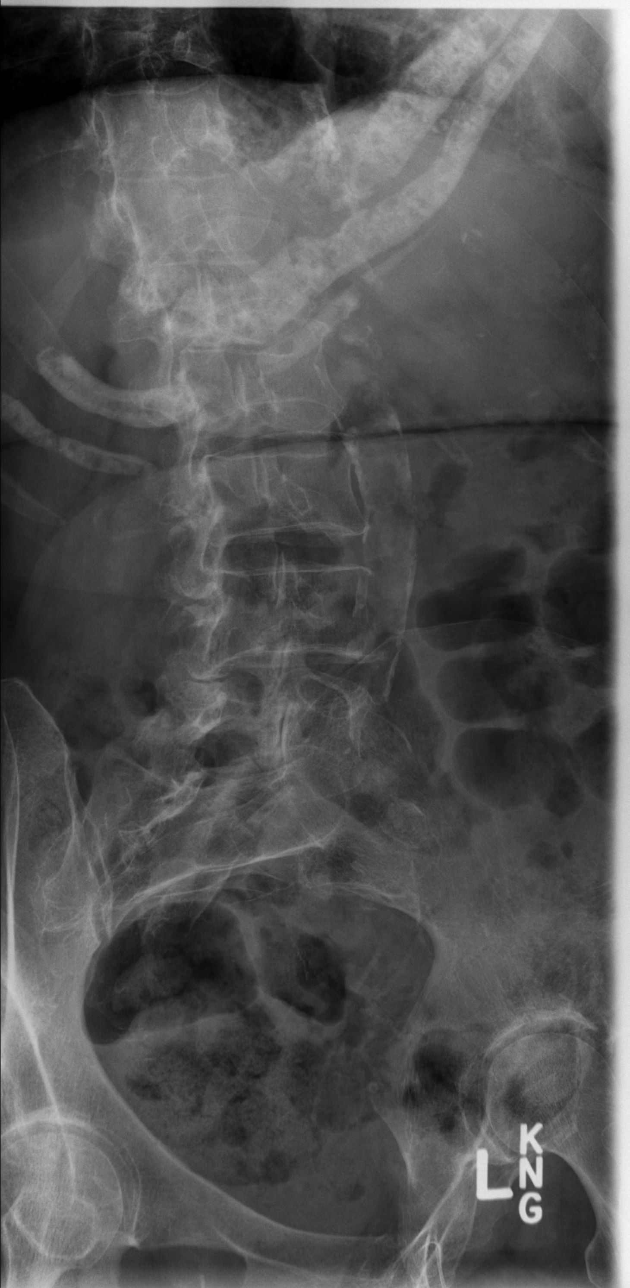

[t lumbar spine lat]
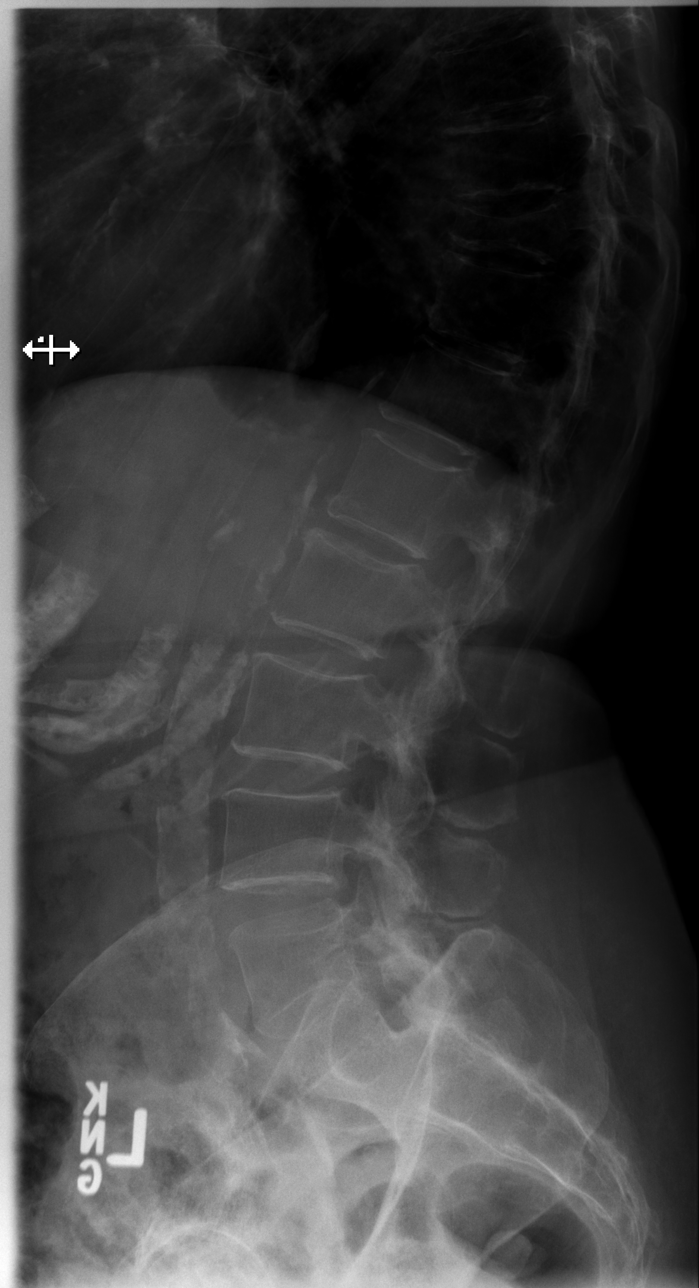

[t lumbar l-5 s-1 spot]
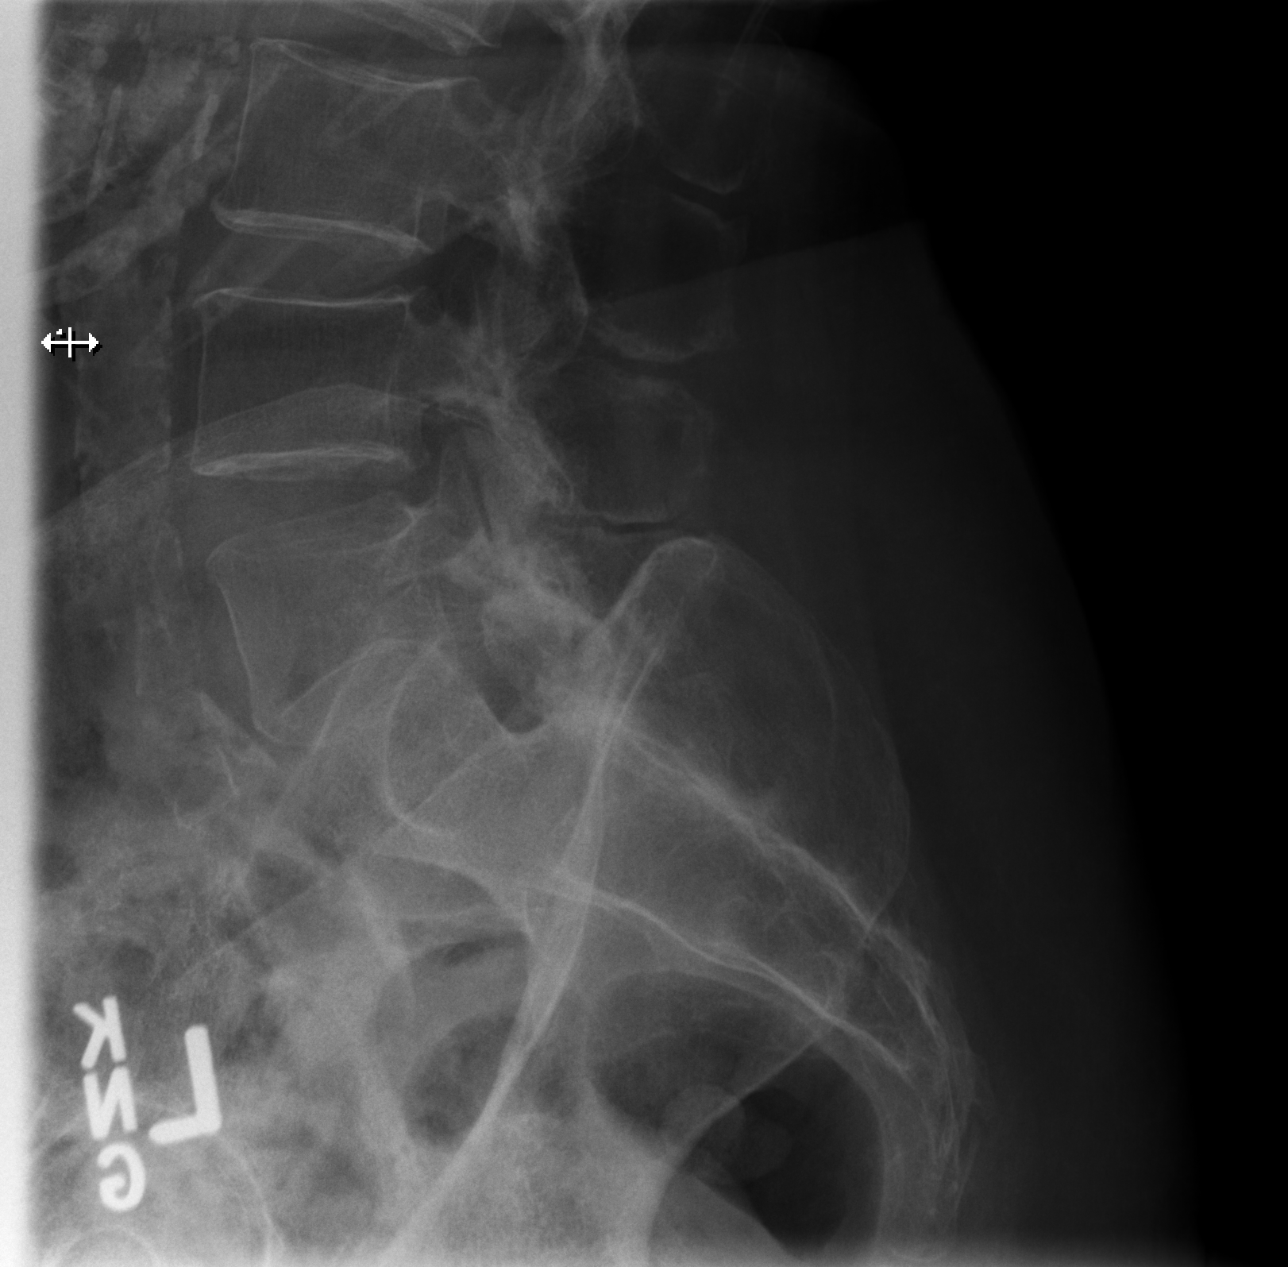

[5 of 5 positions shown; findings below may reference images not displayed]

FINDINGS: Four views study shows no evidence for fracture or subluxation
involving the lumbar spine. Intervertebral disc spaces are preserved
except at L1-2 with there is some mild loss of intervertebral disc
height and mild endplate degeneration. Bones are diffusely
demineralized.

T10 compression fracture is evident.
IMPRESSION: No evidence for lumbar spine fracture.

Compression deformity at the level of T10. Comparing back to a chest
x-ray from 12/09/2013, this appears new in the interval.

## 2015-07-19 DIAGNOSIS — M25512 Pain in left shoulder: Secondary | ICD-10-CM | POA: Diagnosis not present

## 2015-07-19 DIAGNOSIS — R2689 Other abnormalities of gait and mobility: Secondary | ICD-10-CM | POA: Diagnosis not present

## 2015-07-19 DIAGNOSIS — R2681 Unsteadiness on feet: Secondary | ICD-10-CM | POA: Diagnosis not present

## 2015-07-19 DIAGNOSIS — M6281 Muscle weakness (generalized): Secondary | ICD-10-CM | POA: Diagnosis not present

## 2015-07-19 DIAGNOSIS — G4700 Insomnia, unspecified: Secondary | ICD-10-CM | POA: Diagnosis not present

## 2015-07-19 DIAGNOSIS — I6350 Cerebral infarction due to unspecified occlusion or stenosis of unspecified cerebral artery: Secondary | ICD-10-CM | POA: Diagnosis not present

## 2015-07-19 DIAGNOSIS — Z9181 History of falling: Secondary | ICD-10-CM | POA: Diagnosis not present

## 2015-07-19 DIAGNOSIS — E039 Hypothyroidism, unspecified: Secondary | ICD-10-CM | POA: Diagnosis not present

## 2015-07-19 DIAGNOSIS — E870 Hyperosmolality and hypernatremia: Secondary | ICD-10-CM | POA: Diagnosis not present

## 2015-07-23 ENCOUNTER — Encounter: Payer: Self-pay | Admitting: Nurse Practitioner

## 2015-07-23 ENCOUNTER — Non-Acute Institutional Stay (SKILLED_NURSING_FACILITY): Payer: Medicare Other | Admitting: Nurse Practitioner

## 2015-07-23 DIAGNOSIS — M546 Pain in thoracic spine: Secondary | ICD-10-CM | POA: Diagnosis not present

## 2015-07-23 DIAGNOSIS — Z9181 History of falling: Secondary | ICD-10-CM | POA: Diagnosis not present

## 2015-07-23 DIAGNOSIS — F32A Depression, unspecified: Secondary | ICD-10-CM

## 2015-07-23 DIAGNOSIS — I10 Essential (primary) hypertension: Secondary | ICD-10-CM

## 2015-07-23 DIAGNOSIS — K5900 Constipation, unspecified: Secondary | ICD-10-CM

## 2015-07-23 DIAGNOSIS — E870 Hyperosmolality and hypernatremia: Secondary | ICD-10-CM | POA: Diagnosis not present

## 2015-07-23 DIAGNOSIS — R2681 Unsteadiness on feet: Secondary | ICD-10-CM | POA: Diagnosis not present

## 2015-07-23 DIAGNOSIS — M6281 Muscle weakness (generalized): Secondary | ICD-10-CM | POA: Diagnosis not present

## 2015-07-23 DIAGNOSIS — F329 Major depressive disorder, single episode, unspecified: Secondary | ICD-10-CM

## 2015-07-23 DIAGNOSIS — R2689 Other abnormalities of gait and mobility: Secondary | ICD-10-CM | POA: Diagnosis not present

## 2015-07-23 DIAGNOSIS — K219 Gastro-esophageal reflux disease without esophagitis: Secondary | ICD-10-CM | POA: Diagnosis not present

## 2015-07-23 DIAGNOSIS — G4700 Insomnia, unspecified: Secondary | ICD-10-CM | POA: Diagnosis not present

## 2015-07-23 DIAGNOSIS — M25512 Pain in left shoulder: Secondary | ICD-10-CM | POA: Diagnosis not present

## 2015-07-23 DIAGNOSIS — J410 Simple chronic bronchitis: Secondary | ICD-10-CM

## 2015-07-23 DIAGNOSIS — E039 Hypothyroidism, unspecified: Secondary | ICD-10-CM | POA: Diagnosis not present

## 2015-07-23 DIAGNOSIS — R5382 Chronic fatigue, unspecified: Secondary | ICD-10-CM | POA: Diagnosis not present

## 2015-07-23 DIAGNOSIS — I6350 Cerebral infarction due to unspecified occlusion or stenosis of unspecified cerebral artery: Secondary | ICD-10-CM | POA: Diagnosis not present

## 2015-07-24 NOTE — Assessment & Plan Note (Signed)
Controlled. Continue MiraLax bid 

## 2015-07-24 NOTE — Assessment & Plan Note (Signed)
TSH 2.599 12/06/14, 2.295 02/04/15. Continue Levothyroxine 6mcg daily

## 2015-07-24 NOTE — Assessment & Plan Note (Signed)
Pain is better controlled, continue Norco prn, Cymbalta 20mg  

## 2015-07-24 NOTE — Assessment & Plan Note (Signed)
Stable, continue Famotidine 20mg daily.   

## 2015-07-24 NOTE — Assessment & Plan Note (Signed)
mood is stabilized, switched from Zoloft to Cymbalta in setting of back pain, reduced Cymbalta 20mg daily due to c/o feeling shaky-improved. Observe. 

## 2015-07-24 NOTE — Assessment & Plan Note (Signed)
Controlled, continue Clonidine 0.1mf, Carvedilol 12.5mg bid, Diltiazem 120mg, Losartan 50mg daily, monitor Bp 

## 2015-07-24 NOTE — Progress Notes (Signed)
Patient ID: Dana George, female   DOB: April 01, 1923, 82 y.o.   MRN: [id number]  Location:  [facility] Room Number: 10 Place of Service:  SNF (31) Provider: Lennie Odor Mast NP  GREEN, Viviann Spare, MD  Patient Care Team: Estill Dooms, MD as PCP - General (Internal Medicine) Gaynelle Arabian, MD as Consulting Physician (Orthopedic Surgery) Man Mast X, NP as Nurse Practitioner (Nurse Practitioner) [place] (Skilled Nursing and [place])  Extended Emergency Contact Information Primary Emergency Contact: Johnston,Betsy Address: [place], [place] of [place] Phone: [phone] Relation: Daughter Secondary Emergency Contact: Rey,Lindsey  [place] Phone: [phone] Relation: Daughter  Code Status:  DNR Goals of care: Advanced Directive information Advanced Directives 07/23/2015  Does patient have an advance directive? Yes  Type of Paramedic of [place];Living will;Out of facility DNR (pink MOST or yellow form)  Does patient want to make changes to advanced directive? No - Patient declined  Copy of advanced directive(s) in chart? Yes  Pre-existing out of facility DNR order (yellow form or pink MOST form) -     Chief Complaint  Patient presents with  . Medical Management of Chronic Issues    Routine Visit    HPI:  Pt is a 82 y.o. female seen today for medical management of chronic diseases.  Hx of hypothyroidism, takes Levothyroxine 63mcg, last TSH 2.295 02/04/15. Blood pressure has been controlled while on Clonidine 0.1mg , Diltiazem 120mg , Losartan 50mg , Carvedilol 12.5mg  bid, arthrotic pain is controlled on Norco prn, scheduled Norco made her too sleepy,  asymptomatic of GERD or constipation.    Past Medical History  Diagnosis Date  . Hypothyroidism   . Hypertension   . Shortness of breath   . COPD (chronic obstructive pulmonary disease) ([place])   .  Osteoporosis   . Cancer ([place]) 1980    Breast cancer  . TIA (transient ischemic attack)   . Incisional hernia, without obstruction or gangrene 05/27/2014    Lower abd from previous surgery-2-3 small incisional hernias which are reducible-no incarceration or pain complained.    . Insomnia 11/18/2012  . Thoracic spine fracture [facility]) 12/18/2013    01/05/14 T 10 Dr. Newman Pies: PCP managing pain.  F/u 2 months(suggested that if the pain is "not too bad" and seems to be improving that she "live with it" and give a change to heal without intervention.  12/18/13 X-ray Thoracic spine: Compression deformity at the level of T10. Comparing back to a chest x-ray from 12/09/2013, this appears new in the interval.       . Back pain, thoracic 01/12/2014  . Left hemiparesis ([place]) 06/08/2014  . CVA (cerebral infarction) 06/08/2011    Following abdominal surgery for bowel obstruction   . Granuloma annulare 06/08/2014  . Constipation 11/18/2012  . Depression 12/26/2013  . DVT (deep venous thrombosis) ([place]) 11/18/2012  . Fall 02/07/2013  . GERD (gastroesophageal reflux disease) 03/30/2014  . HTN (hypertension) 11/18/2012    01/15/14 Bun/creat 14/0.60   . Ventral hernia 06/08/2014   Past Surgical History  Procedure Laterality Date  . Abdominal hysterectomy    . Total knee arthroplasty  2010    left knee  . Mastectomy  1980    left side  . Tonsillectomy    . Exploratory laparotomy w/ bowel resection      10/19/12-11/01/12 [facility] for exploratory laparotomty with extensive  lysis of adhesion and segmental small bowel resection with end to end anastomosis per Dr. Isidore Moos 10/22/12  . Cataract extraction, bilateral      Allergies  Allergen Reactions  . Cortisone       Medication List       This list is accurate as of: 07/23/15 11:59 PM.  Always use your most recent med list.               acetaminophen 325 MG tablet  Commonly known as:  TYLENOL  Take 650 mg by mouth every 4 (four) hours as needed for  moderate pain.     antiseptic oral rinse Liqd  15 mLs by Mouth Rinse route as needed for dry mouth.     aspirin 81 MG chewable tablet  Chew 81 mg by mouth every morning.     carvedilol 12.5 MG tablet  Commonly known as:  COREG  Take 12.5 mg by mouth 2 (two) times daily with a meal.     cloNIDine 0.1 MG tablet  Commonly known as:  CATAPRES  Take 0.1 mg by mouth every morning.     diltiazem 120 MG 24 hr capsule  Commonly known as:  CARDIZEM CD  Take 120 mg by mouth daily.     DULoxetine 20 MG capsule  Commonly known as:  CYMBALTA  Take 20 mg by mouth daily.     famotidine 20 MG tablet  Commonly known as:  PEPCID  Take 20 mg by mouth at bedtime.     feeding supplement Liqd  Take 1 Container by mouth daily as needed.     levothyroxine 50 MCG tablet  Commonly known as:  SYNTHROID, LEVOTHROID  Take 50 mcg by mouth every morning.     losartan 50 MG tablet  Commonly known as:  COZAAR  Take 50 mg by mouth daily.     Melatonin 3 MG Tbdp  Take 3 mg by mouth at bedtime.     multivitamin with minerals Tabs tablet  Take 1 tablet by mouth every morning.     PEDIA-LAX 2.8 g Supp  Generic drug:  Glycerin (Laxative)  Place rectally as needed (for constipation).     polyethylene glycol packet  Commonly known as:  MIRALAX / GLYCOLAX  Take 17 g by mouth daily as needed.     polyvinyl alcohol 1.4 % ophthalmic solution  Commonly known as:  LIQUIFILM TEARS  Place 1 drop into both eyes 4 (four) times daily.     ROBAFEN COUGH PO  Take 10 ml by mouth every 6 hours as needed for cough     sennosides-docusate sodium 8.6-50 MG tablet  Commonly known as:  SENOKOT-S  Take 1 tablet by mouth daily.     tiotropium 18 MCG inhalation capsule  Commonly known as:  SPIRIVA  Place 18 mcg into inhaler and inhale daily.     VENTOLIN HFA 108 (90 Base) MCG/ACT inhaler  Generic drug:  albuterol  Inhale 2 puffs into the lungs every 6 (six) hours as needed for wheezing or shortness of breath.          Review of Systems  Constitutional: Negative for fever, chills and diaphoresis.       Generalized weakness.   HENT: Positive for hearing loss. Negative for congestion, ear discharge and ear pain.   Eyes: Negative for pain, discharge and redness.  Respiratory: Positive for cough. Negative for shortness of breath, wheezing and stridor.        Chronic hacking cough  occasionally  Cardiovascular: Positive for leg swelling. Negative for chest pain and palpitations.       Trace in ankles.   Gastrointestinal: Negative for nausea, vomiting, abdominal pain, diarrhea and constipation.       Lower abd incisional hernia.    Genitourinary: Positive for frequency. Negative for dysuria, urgency, hematuria and flank pain.       Q2-3 hours average.   Musculoskeletal: Positive for back pain. Negative for myalgias and neck pain.       Improved back pain. She is able to ambulates with walker on unit.  Resolution of Left hip pain since 11/25/14.  Skin: Negative for rash.       Lower abd incisional hernia-reducible.  Neurological: Positive for weakness. Negative for dizziness, tremors, seizures and headaches.  Psychiatric/Behavioral: Negative for suicidal ideas and hallucinations. The patient is not nervous/anxious.        C/o sleeps too much.    Immunization History  Administered Date(s) Administered  . Influenza,inj,Quad PF,36+ Mos 12/19/2013  . Influenza-Unspecified 01/21/2013, 12/28/2014  . PPD Test 01/02/2014  . Pneumococcal Polysaccharide-23 11/24/1999  . Td 12/28/1994  . Tdap 04/08/2013   Pertinent  Health Maintenance Due  Topic Date Due  . DEXA SCAN  05/24/1990  . PNA vac Low Risk Adult (2 of 2 - PCV13) 11/23/2000  . INFLUENZA VACCINE  10/22/2015   Fall Risk  12/03/2014 06/08/2014  Falls in the past year? Yes No  Number falls in past yr: 1 -  Injury with Fall? No -  Risk for fall due to : History of fall(s);Impaired balance/gait;Impaired mobility Impaired balance/gait  Follow up  Falls evaluation completed -   Functional Status Survey:    Filed Vitals:   07/23/15 0920  BP: 147/77  Pulse: 69  Temp: 96.5 F (35.8 C)  TempSrc: Oral  Resp: 18  Height: 5\' 2"  (1.575 m)  Weight: 119 lb (53.978 kg)   Body mass index is 21.76 kg/(m^2). Physical Exam  Constitutional: She is oriented to person, place, and time. She appears well-developed and well-nourished. No distress.  HENT:  Head: Normocephalic and atraumatic.  Right Ear: External ear normal.  Left Ear: External ear normal.  Nose: Nose normal.  Mouth/Throat: Oropharynx is clear and moist. No oropharyngeal exudate.  Eyes: Conjunctivae and EOM are normal. Pupils are equal, round, and reactive to light. Right eye exhibits no discharge. Left eye exhibits no discharge. No scleral icterus.  Neck: Normal range of motion. Neck supple. No JVD present. No tracheal deviation present. No thyromegaly present.  Cardiovascular: Normal rate, regular rhythm, normal heart sounds and intact distal pulses.   No murmur heard. Pulmonary/Chest: Effort normal. No stridor. No respiratory distress. She has decreased breath sounds. She has no wheezes. She has rales. She exhibits no tenderness.  bibasilar  Abdominal: Soft. She exhibits no distension and no mass. There is no tenderness.  Musculoskeletal: Normal range of motion. She exhibits edema and tenderness.  Ankles and feet have trace edema. Mid back pain.  Unstable gait. Uses rolling walker.  Neurological: She is alert and oriented to person, place, and time. No cranial nerve deficit. She exhibits normal muscle tone. Coordination normal.  Left hemiparesis  Skin: Skin is warm and dry. No rash noted. She is not diaphoretic. No erythema. No pallor.  Mid abd surgical scar-healed-lower abd reducible incisional hernia-the patient has concerns of cosmetic reason but denied pain.  Left mastectomy Scalp skin lesion above the left ear.   Psychiatric: She has a normal mood and affect.  Her  behavior is normal. Judgment and thought content normal.  Repetitive.     Labs reviewed:  Recent Labs  02/04/15 03/29/15 05/07/15  NA 140 140 136*  K 4.9 4.3 4.3  BUN 20 25* 25*  CREATININE 0.6 0.6 0.7    Recent Labs  02/04/15 03/29/15 05/07/15  AST 16 19 18   ALT 10 16 14   ALKPHOS 89 80 108    Recent Labs  12/06/14 02/04/15 03/29/15 05/07/15  WBC 5.3  --  6.0 6.7  HGB 15.0 14.8 15.4 15.2  HCT 43 46 45 46  PLT 252 321 379 352   Lab Results  Component Value Date   TSH 2.30 02/04/2015   No results found for: HGBA1C Lab Results  Component Value Date   CHOL 121 11/22/2012   HDL 40 11/22/2012   LDLCALC 65 11/22/2012   TRIG 81 11/22/2012    Significant Diagnostic Results in last 30 days:  No results found.  Assessment/Plan Back pain, thoracic Pain is better controlled, continue Norco prn, Cymbalta 20mg    Constipation Controlled. Continue MiraLax bid   COPD (chronic obstructive pulmonary disease) CXR 12/14/14 ruled out PNA, last treated with  7 day course of Augmentin 875mg  q12h Continue Spiriva daily, prn Ventolin 2 puffs q6h 3 weeks ago. Treated Congestive cough, yellow phlegm, malaise 2-3 days, denied chest pain, no O2 desaturation, afebrile, will obtain CXR, CBC, CMP, Augmentin 875mg  q12hr x7 days stable. Chronic hacking cough occasionally.     Depression mood is stabilized, switched from Zoloft to Cymbalta in setting of back pain, reduced Cymbalta 20mg  daily due to c/o feeling shaky-improved. Observe.   Fatigue In her usual state of health.    GERD (gastroesophageal reflux disease) Stable, continue Famotidine 20mg  daily.    HTN (hypertension) Controlled, continue Clonidine 0.50mf, Carvedilol 12.5mg  bid, Diltiazem 120mg , Losartan 50mg  daily, monitor Bp    Hypothyroidism TSH 2.599 12/06/14, 2.295 02/04/15. Continue Levothyroxine 70mcg daily     Family/ staff Communication: SNF for care needs.   Labs/tests ordered: none

## 2015-07-24 NOTE — Assessment & Plan Note (Signed)
In her usual state of health.  

## 2015-07-24 NOTE — Assessment & Plan Note (Signed)
CXR 12/14/14 ruled out PNA, last treated with  7 day course of Augmentin 875mg  q12h Continue Spiriva daily, prn Ventolin 2 puffs q6h 3 weeks ago. Treated Congestive cough, yellow phlegm, malaise 2-3 days, denied chest pain, no O2 desaturation, afebrile, will obtain CXR, CBC, CMP, Augmentin 875mg  q12hr x7 days stable. Chronic hacking cough occasionally.

## 2015-07-25 DIAGNOSIS — M6281 Muscle weakness (generalized): Secondary | ICD-10-CM | POA: Diagnosis not present

## 2015-07-25 DIAGNOSIS — Z9181 History of falling: Secondary | ICD-10-CM | POA: Diagnosis not present

## 2015-07-25 DIAGNOSIS — M25512 Pain in left shoulder: Secondary | ICD-10-CM | POA: Diagnosis not present

## 2015-07-25 DIAGNOSIS — E039 Hypothyroidism, unspecified: Secondary | ICD-10-CM | POA: Diagnosis not present

## 2015-07-25 DIAGNOSIS — R2689 Other abnormalities of gait and mobility: Secondary | ICD-10-CM | POA: Diagnosis not present

## 2015-07-25 DIAGNOSIS — R2681 Unsteadiness on feet: Secondary | ICD-10-CM | POA: Diagnosis not present

## 2015-07-31 DIAGNOSIS — R2681 Unsteadiness on feet: Secondary | ICD-10-CM | POA: Diagnosis not present

## 2015-07-31 DIAGNOSIS — Z9181 History of falling: Secondary | ICD-10-CM | POA: Diagnosis not present

## 2015-07-31 DIAGNOSIS — M6281 Muscle weakness (generalized): Secondary | ICD-10-CM | POA: Diagnosis not present

## 2015-07-31 DIAGNOSIS — M25512 Pain in left shoulder: Secondary | ICD-10-CM | POA: Diagnosis not present

## 2015-07-31 DIAGNOSIS — R2689 Other abnormalities of gait and mobility: Secondary | ICD-10-CM | POA: Diagnosis not present

## 2015-07-31 DIAGNOSIS — E039 Hypothyroidism, unspecified: Secondary | ICD-10-CM | POA: Diagnosis not present

## 2015-08-01 DIAGNOSIS — Z9181 History of falling: Secondary | ICD-10-CM | POA: Diagnosis not present

## 2015-08-01 DIAGNOSIS — R2689 Other abnormalities of gait and mobility: Secondary | ICD-10-CM | POA: Diagnosis not present

## 2015-08-01 DIAGNOSIS — E039 Hypothyroidism, unspecified: Secondary | ICD-10-CM | POA: Diagnosis not present

## 2015-08-01 DIAGNOSIS — M6281 Muscle weakness (generalized): Secondary | ICD-10-CM | POA: Diagnosis not present

## 2015-08-01 DIAGNOSIS — R2681 Unsteadiness on feet: Secondary | ICD-10-CM | POA: Diagnosis not present

## 2015-08-01 DIAGNOSIS — M25512 Pain in left shoulder: Secondary | ICD-10-CM | POA: Diagnosis not present

## 2015-08-20 ENCOUNTER — Non-Acute Institutional Stay (SKILLED_NURSING_FACILITY): Payer: Medicare Other | Admitting: Nurse Practitioner

## 2015-08-20 ENCOUNTER — Encounter: Payer: Self-pay | Admitting: Nurse Practitioner

## 2015-08-20 DIAGNOSIS — F32A Depression, unspecified: Secondary | ICD-10-CM

## 2015-08-20 DIAGNOSIS — E039 Hypothyroidism, unspecified: Secondary | ICD-10-CM

## 2015-08-20 DIAGNOSIS — S22000A Wedge compression fracture of unspecified thoracic vertebra, initial encounter for closed fracture: Secondary | ICD-10-CM

## 2015-08-20 DIAGNOSIS — J410 Simple chronic bronchitis: Secondary | ICD-10-CM | POA: Diagnosis not present

## 2015-08-20 DIAGNOSIS — F329 Major depressive disorder, single episode, unspecified: Secondary | ICD-10-CM

## 2015-08-20 DIAGNOSIS — I10 Essential (primary) hypertension: Secondary | ICD-10-CM

## 2015-08-20 DIAGNOSIS — K5900 Constipation, unspecified: Secondary | ICD-10-CM

## 2015-08-20 DIAGNOSIS — K219 Gastro-esophageal reflux disease without esophagitis: Secondary | ICD-10-CM | POA: Diagnosis not present

## 2015-08-20 NOTE — Assessment & Plan Note (Signed)
Controlled. Continue MiraLax bid 

## 2015-08-20 NOTE — Assessment & Plan Note (Signed)
CXR 12/14/14 ruled out PNA, last treated with  7 day course of Augmentin 875mg  q12h Continue Spiriva daily, prn Ventolin 2 puffs q6h 3 weeks ago. Treated Congestive cough, yellow phlegm, malaise 2-3 days, denied chest pain, no O2 desaturation, afebrile, will obtain CXR, CBC, CMP, Augmentin 875mg  q12hr x7 days stable. Chronic hacking cough occasionally.

## 2015-08-20 NOTE — Assessment & Plan Note (Signed)
Controlled, continue Clonidine 0.27mf, Carvedilol 12.5mg  bid, Diltiazem 120mg , Losartan 50mg  daily, monitor Bp

## 2015-08-20 NOTE — Assessment & Plan Note (Signed)
Stable, continue Famotidine 20mg daily.   

## 2015-08-20 NOTE — Assessment & Plan Note (Signed)
TSH 2.599 12/06/14, 2.295 02/04/15. Continue Levothyroxine 36mcg daily

## 2015-08-20 NOTE — Assessment & Plan Note (Signed)
mood is stabilized, switched from Zoloft to Cymbalta in setting of back pain, reduced Cymbalta 20mg  daily due to c/o feeling shaky-improved. Observe.

## 2015-08-20 NOTE — Assessment & Plan Note (Signed)
Pain is better controlled, continue Norco prn, Cymbalta 20mg 

## 2015-08-20 NOTE — Progress Notes (Signed)
Patient ID: Dana George, female   DOB: Apr 16, 1923, 82 y.o.   MRN: [id number]  Location:  [place] Room Number: N 10 Place of Service:  SNF (31) Provider: Lennie Odor Mast NP  GREEN, Viviann Spare, MD  Patient Care Team: Estill Dooms, MD as PCP - General (Internal Medicine) Gaynelle Arabian, MD as Consulting Physician (Orthopedic Surgery) Man Mast X, NP as Nurse Practitioner (Nurse Practitioner) [place] (Skilled Nursing and [place])  Extended Emergency Contact Information Primary Emergency Contact: Johnston,Betsy Address: [place], [place] of [place] Phone: [phone] Relation: Daughter Secondary Emergency Contact: Rey,Lindsey  [place] Phone: [phone] Relation: Daughter  Code Status:  DNR Goals of care: Advanced Directive information Advanced Directives 08/20/2015  Does patient have an advance directive? Yes  Type of Paramedic of [place];Living will;Out of facility DNR (pink MOST or yellow form)  Does patient want to make changes to advanced directive? No - Patient declined  Copy of advanced directive(s) in chart? Yes     Chief Complaint  Patient presents with  . Medical Management of Chronic Issues    HPI:  Pt is a 82 y.o. female seen today for medical management of chronic diseases.  Hx of hypothyroidism, takes Levothyroxine 11mcg, last TSH 2.295 02/04/15. Blood pressure has been controlled while on Clonidine 0.1mg , Diltiazem 120mg , Losartan 50mg , Carvedilol 12.5mg  bid, arthrotic pain is controlled on Norco prn, scheduled Norco made her too sleepy,  asymptomatic of GERD or constipation.    Past Medical History  Diagnosis Date  . Hypothyroidism   . Hypertension   . Shortness of breath   . COPD (chronic obstructive pulmonary disease) ([place])   . Osteoporosis   . Cancer ([place]) 1980    Breast cancer  . TIA (transient ischemic attack)    . Incisional hernia, without obstruction or gangrene 05/27/2014    Lower abd from previous surgery-2-3 small incisional hernias which are reducible-no incarceration or pain complained.    . Insomnia 11/18/2012  . Thoracic spine fracture [facility]) 12/18/2013    01/05/14 T 10 Dr. Newman Pies: PCP managing pain.  F/u 2 months(suggested that if the pain is "not too bad" and seems to be improving that she "live with it" and give a change to heal without intervention.  12/18/13 X-ray Thoracic spine: Compression deformity at the level of T10. Comparing back to a chest x-ray from 12/09/2013, this appears new in the interval.       . Back pain, thoracic 01/12/2014  . Left hemiparesis ([place]) 06/08/2014  . CVA (cerebral infarction) 06/08/2011    Following abdominal surgery for bowel obstruction   . Granuloma annulare 06/08/2014  . Constipation 11/18/2012  . Depression 12/26/2013  . DVT (deep venous thrombosis) ([place]) 11/18/2012  . Fall 02/07/2013  . GERD (gastroesophageal reflux disease) 03/30/2014  . HTN (hypertension) 11/18/2012    01/15/14 Bun/creat 14/0.60   . Ventral hernia 06/08/2014   Past Surgical History  Procedure Laterality Date  . Abdominal hysterectomy    . Total knee arthroplasty  2010    left knee  . Mastectomy  1980    left side  . Tonsillectomy    . Exploratory laparotomy w/ bowel resection      10/19/12-11/01/12 [facility] for exploratory laparotomty with extensive lysis of adhesion and segmental small bowel resection with end to end anastomosis per Dr. Isidore Moos 10/22/12  .  Cataract extraction, bilateral      Allergies  Allergen Reactions  . Cortisone       Medication List       This list is accurate as of: 08/20/15  5:02 PM.  Always use your most recent med list.               acetaminophen 325 MG tablet  Commonly known as:  TYLENOL  Take 650 mg by mouth every 4 (four) hours as needed for moderate pain.     antiseptic oral rinse Liqd  15 mLs by Mouth Rinse route as needed  for dry mouth.     aspirin 81 MG chewable tablet  Chew 81 mg by mouth every morning.     carvedilol 12.5 MG tablet  Commonly known as:  COREG  Take 12.5 mg by mouth 2 (two) times daily with a meal.     cloNIDine 0.1 MG tablet  Commonly known as:  CATAPRES  Take 0.1 mg by mouth every morning.     diltiazem 120 MG 24 hr capsule  Commonly known as:  CARDIZEM CD  Take 120 mg by mouth daily.     DULoxetine 20 MG capsule  Commonly known as:  CYMBALTA  Take 20 mg by mouth daily.     famotidine 20 MG tablet  Commonly known as:  PEPCID  Take 20 mg by mouth daily as needed.     feeding supplement Liqd  Take 1 Container by mouth daily as needed.     levothyroxine 50 MCG tablet  Commonly known as:  SYNTHROID, LEVOTHROID  Take 50 mcg by mouth every morning.     losartan 50 MG tablet  Commonly known as:  COZAAR  Take 50 mg by mouth daily.     Melatonin 3 MG Tbdp  Take 3 mg by mouth at bedtime.     multivitamin with minerals Tabs tablet  Take 1 tablet by mouth every morning.     PEDIA-LAX 2.8 g Supp  Generic drug:  Glycerin (Laxative)  Place rectally as needed (for constipation).     polyethylene glycol packet  Commonly known as:  MIRALAX / GLYCOLAX  Take 17 g by mouth daily as needed.     polyvinyl alcohol 1.4 % ophthalmic solution  Commonly known as:  LIQUIFILM TEARS  Place 1 drop into both eyes 4 (four) times daily as needed.     tiotropium 18 MCG inhalation capsule  Commonly known as:  SPIRIVA  Place 18 mcg into inhaler and inhale daily.     VENTOLIN HFA 108 (90 Base) MCG/ACT inhaler  Generic drug:  albuterol  Inhale 2 puffs into the lungs daily. Take 2 puffs Every 6 hours as needed for shortness of breath and wheezing        Review of Systems  Constitutional: Negative for fever, chills and diaphoresis.       Generalized weakness.   HENT: Positive for hearing loss. Negative for congestion, ear discharge and ear pain.   Eyes: Negative for pain, discharge and  redness.  Respiratory: Positive for cough. Negative for shortness of breath, wheezing and stridor.        Chronic hacking cough occasionally  Cardiovascular: Positive for leg swelling. Negative for chest pain and palpitations.       Trace in ankles.   Gastrointestinal: Negative for nausea, vomiting, abdominal pain, diarrhea and constipation.       Lower abd incisional hernia.    Genitourinary: Positive for frequency. Negative for dysuria, urgency,  hematuria and flank pain.       Q2-3 hours average.   Musculoskeletal: Positive for back pain. Negative for myalgias and neck pain.       Improved back pain. She is able to ambulates with walker on unit.  Resolution of Left hip pain since 11/25/14.  Skin: Negative for rash.       Lower abd incisional hernia-reducible.  Neurological: Positive for weakness. Negative for dizziness, tremors, seizures and headaches.  Psychiatric/Behavioral: Negative for suicidal ideas and hallucinations. The patient is not nervous/anxious.        C/o sleeps too much.    Immunization History  Administered Date(s) Administered  . Influenza,inj,Quad PF,36+ Mos 12/19/2013  . Influenza-Unspecified 01/21/2013, 12/28/2014  . PPD Test 01/02/2014  . Pneumococcal Polysaccharide-23 11/24/1999  . Td 12/28/1994  . Tdap 04/08/2013   Pertinent  Health Maintenance Due  Topic Date Due  . DEXA SCAN  05/24/1990  . PNA vac Low Risk Adult (2 of 2 - PCV13) 11/23/2000  . INFLUENZA VACCINE  10/22/2015   Fall Risk  12/03/2014 06/08/2014  Falls in the past year? Yes No  Number falls in past yr: 1 -  Injury with Fall? No -  Risk for fall due to : History of fall(s);Impaired balance/gait;Impaired mobility Impaired balance/gait  Follow up Falls evaluation completed -   Functional Status Survey:    Filed Vitals:   08/20/15 1451  BP: 116/60  Pulse: 65  Temp: 97.2 F (36.2 C)  TempSrc: Oral  Resp: 20  Height: 5\' 2"  (1.575 m)  Weight: 119 lb (53.978 kg)  SpO2: 97%   Body  mass index is 21.76 kg/(m^2). Physical Exam  Constitutional: She is oriented to person, place, and time. She appears well-developed and well-nourished. No distress.  HENT:  Head: Normocephalic and atraumatic.  Right Ear: External ear normal.  Left Ear: External ear normal.  Nose: Nose normal.  Mouth/Throat: Oropharynx is clear and moist. No oropharyngeal exudate.  Eyes: Conjunctivae and EOM are normal. Pupils are equal, round, and reactive to light. Right eye exhibits no discharge. Left eye exhibits no discharge. No scleral icterus.  Neck: Normal range of motion. Neck supple. No JVD present. No tracheal deviation present. No thyromegaly present.  Cardiovascular: Normal rate, regular rhythm, normal heart sounds and intact distal pulses.   No murmur heard. Pulmonary/Chest: Effort normal. No stridor. No respiratory distress. She has decreased breath sounds. She has no wheezes. She has rales. She exhibits no tenderness.  bibasilar  Abdominal: Soft. She exhibits no distension and no mass. There is no tenderness.  Musculoskeletal: Normal range of motion. She exhibits edema and tenderness.  Ankles and feet have trace edema. Mid back pain.  Unstable gait. Uses rolling walker.  Neurological: She is alert and oriented to person, place, and time. No cranial nerve deficit. She exhibits normal muscle tone. Coordination normal.  Left hemiparesis  Skin: Skin is warm and dry. No rash noted. She is not diaphoretic. No erythema. No pallor.  Mid abd surgical scar-healed-lower abd reducible incisional hernia-the patient has concerns of cosmetic reason but denied pain.  Left mastectomy Scalp skin lesion above the left ear.   Psychiatric: She has a normal mood and affect. Her behavior is normal. Judgment and thought content normal.  Repetitive.     Labs reviewed:  Recent Labs  02/04/15 03/29/15 05/07/15  NA 140 140 136*  K 4.9 4.3 4.3  BUN 20 25* 25*  CREATININE 0.6 0.6 0.7    Recent Labs  02/04/15  03/29/15 05/07/15  AST 16 19 18   ALT 10 16 14   ALKPHOS 89 80 108    Recent Labs  12/06/14 02/04/15 03/29/15 05/07/15  WBC 5.3  --  6.0 6.7  HGB 15.0 14.8 15.4 15.2  HCT 43 46 45 46  PLT 252 321 379 352   Lab Results  Component Value Date   TSH 2.30 02/04/2015   No results found for: HGBA1C Lab Results  Component Value Date   CHOL 121 11/22/2012   HDL 40 11/22/2012   LDLCALC 65 11/22/2012   TRIG 81 11/22/2012    Significant Diagnostic Results in last 30 days:  No results found.  Assessment/Plan HTN (hypertension) Controlled, continue Clonidine 0.43mf, Carvedilol 12.5mg  bid, Diltiazem 120mg , Losartan 50mg  daily, monitor Bp  COPD (chronic obstructive pulmonary disease) CXR 12/14/14 ruled out PNA, last treated with  7 day course of Augmentin 875mg  q12h Continue Spiriva daily, prn Ventolin 2 puffs q6h 3 weeks ago. Treated Congestive cough, yellow phlegm, malaise 2-3 days, denied chest pain, no O2 desaturation, afebrile, will obtain CXR, CBC, CMP, Augmentin 875mg  q12hr x7 days stable. Chronic hacking cough occasionally.      GERD (gastroesophageal reflux disease) Stable, continue Famotidine 20mg  daily.   Constipation Controlled. Continue MiraLax bid  Hypothyroidism TSH 2.599 12/06/14, 2.295 02/04/15. Continue Levothyroxine 29mcg daily  Thoracic spine fracture Pain is better controlled, continue Norco prn, Cymbalta 20mg   Depression mood is stabilized, switched from Zoloft to Cymbalta in setting of back pain, reduced Cymbalta 20mg  daily due to c/o feeling shaky-improved. Observe.     Family/ staff Communication: SNF for care needs.   Labs/tests ordered: none

## 2015-10-15 ENCOUNTER — Non-Acute Institutional Stay (SKILLED_NURSING_FACILITY): Payer: Medicare Other | Admitting: Nurse Practitioner

## 2015-10-15 ENCOUNTER — Encounter: Payer: Self-pay | Admitting: Nurse Practitioner

## 2015-10-15 DIAGNOSIS — M5440 Lumbago with sciatica, unspecified side: Secondary | ICD-10-CM

## 2015-10-15 DIAGNOSIS — J410 Simple chronic bronchitis: Secondary | ICD-10-CM | POA: Diagnosis not present

## 2015-10-15 DIAGNOSIS — E039 Hypothyroidism, unspecified: Secondary | ICD-10-CM | POA: Diagnosis not present

## 2015-10-15 DIAGNOSIS — K5900 Constipation, unspecified: Secondary | ICD-10-CM | POA: Diagnosis not present

## 2015-10-15 DIAGNOSIS — K219 Gastro-esophageal reflux disease without esophagitis: Secondary | ICD-10-CM

## 2015-10-15 DIAGNOSIS — F329 Major depressive disorder, single episode, unspecified: Secondary | ICD-10-CM | POA: Diagnosis not present

## 2015-10-15 DIAGNOSIS — I10 Essential (primary) hypertension: Secondary | ICD-10-CM | POA: Diagnosis not present

## 2015-10-15 DIAGNOSIS — R5382 Chronic fatigue, unspecified: Secondary | ICD-10-CM | POA: Diagnosis not present

## 2015-10-15 DIAGNOSIS — R609 Edema, unspecified: Secondary | ICD-10-CM | POA: Diagnosis not present

## 2015-10-15 DIAGNOSIS — F32A Depression, unspecified: Secondary | ICD-10-CM

## 2015-10-15 NOTE — Assessment & Plan Note (Signed)
Minimal,

## 2015-10-15 NOTE — Assessment & Plan Note (Signed)
Stable, continue Famotidine 20mg daily.   

## 2015-10-15 NOTE — Assessment & Plan Note (Signed)
C/o generalized fatigue, malaise since the URI early this month, denied cough, pain, palpitation, insomnia, focal weakness. Will update CBC, CMP, TSH, UA C/S

## 2015-10-15 NOTE — Assessment & Plan Note (Signed)
Pain is better controlled, continue Norco prn, Cymbalta 20mg 

## 2015-10-15 NOTE — Assessment & Plan Note (Signed)
Controlled. Continue MiraLax bid 

## 2015-10-15 NOTE — Assessment & Plan Note (Signed)
TSH 2.599 12/06/14, 2.295 02/04/15. Continue Levothyroxine 6mcg daily

## 2015-10-15 NOTE — Assessment & Plan Note (Addendum)
CXR 12/14/14 ruled out PNA, last treated with  7 day course of Augmentin 875mg  q12h Continue Spiriva daily, prn Ventolin 2 puffs q6h 3 weeks ago. Treated Congestive cough, yellow phlegm, malaise 2-3 days, denied chest pain, no O2 desaturation, afebrile, will obtain CXR, CBC, CMP, Augmentin 875mg  q12hr x7 days stable. Chronic hacking cough occasionally. Diffused coarse expiratory wheezes noted, adding Medrol dose pk

## 2015-10-15 NOTE — Assessment & Plan Note (Signed)
mood is stabilized, switched from Zoloft to Cymbalta in setting of back pain, reduced Cymbalta 20mg  daily due to c/o feeling shaky-improved. Observe.

## 2015-10-15 NOTE — Assessment & Plan Note (Signed)
Controlled, continue Clonidine 0.51mf, Carvedilol 12.5mg  bid, Diltiazem 120mg , Losartan 50mg  daily, monitor Bp

## 2015-10-15 NOTE — Progress Notes (Signed)
Location:   [facility] Room Number: N-10 Place of Service:  SNF (31) Provider:  Jovonte Commins  NP Jeanmarie Hubert, MD  Patient Care Team: Estill Dooms, MD as PCP - General (Internal Medicine) Gaynelle Arabian, MD as Consulting Physician (Orthopedic Surgery) Richerd Grime Otho Darner, NP as Nurse Practitioner (Nurse Practitioner) [place] (Skilled Nursing and [place])  Extended Emergency Contact Information Primary Emergency Contact: Johnston,Betsy Address: [place], [place] Phone: [phone] Relation: Daughter Secondary Emergency Contact: Rey,Lindsey  [place] Phone: [phone] Relation: Daughter  Code Status:  DNR Goals of care: Advanced Directive information Advanced Directives 10/15/2015  Does patient have an advance directive? Yes  Type of Paramedic of [place];Living will;Out of facility DNR (pink MOST or yellow form)  Does patient want to make changes to advanced directive? No - Patient declined  Copy of advanced directive(s) in chart? Yes  Pre-existing out of facility DNR order (yellow form or pink MOST form) -     Chief Complaint  Patient presents with  . Acute Visit    Continued weakness     HPI:  Pt is a 82 y.o. female seen today for an acute visit for c/o fatigue, malaise since the URI treated early this month, hx of COPD, chronic cough, but no noted increased phlegm production, denied chest pain or palpitation. Hx of depression and back pain, better managed with Cymbalta. Her blood pressure is well controlled.    Past Medical History:  Diagnosis Date  . Back pain, thoracic 01/12/2014  . Cancer ([place]) 1980   Breast cancer  . Constipation 11/18/2012  . COPD (chronic obstructive pulmonary disease) ([place])   . CVA (cerebral infarction) 06/08/2011   Following abdominal surgery for bowel obstruction   . Depression 12/26/2013  . DVT  (deep venous thrombosis) ([place]) 11/18/2012  . Fall 02/07/2013  . GERD (gastroesophageal reflux disease) 03/30/2014  . Granuloma annulare 06/08/2014  . HTN (hypertension) 11/18/2012   01/15/14 Bun/creat 14/0.60   . Hypertension   . Hypothyroidism   . Incisional hernia, without obstruction or gangrene 05/27/2014   Lower abd from previous surgery-2-3 small incisional hernias which are reducible-no incarceration or pain complained.    . Insomnia 11/18/2012  . Left hemiparesis ([place]) 06/08/2014  . Osteoporosis   . Shortness of breath   . Thoracic spine fracture [facility]) 12/18/2013   01/05/14 T 10 Dr. Newman Pies: PCP managing pain.  F/u 2 months(suggested that if the pain is "not too bad" and seems to be improving that she "live with it" and give a change to heal without intervention.  12/18/13 X-ray Thoracic spine: Compression deformity at the level of T10. Comparing back to a chest x-ray from 12/09/2013, this appears new in the interval.       . TIA (transient ischemic attack)   . Ventral hernia 06/08/2014   Past Surgical History:  Procedure Laterality Date  . ABDOMINAL HYSTERECTOMY    . CATARACT EXTRACTION, BILATERAL    . EXPLORATORY LAPAROTOMY W/ BOWEL RESECTION     10/19/12-11/01/12 [facility] for exploratory laparotomty with extensive lysis of adhesion and segmental small bowel resection with end to end anastomosis per Dr. Isidore Moos 10/22/12  . MASTECTOMY  1980   left side  . TONSILLECTOMY    . TOTAL KNEE ARTHROPLASTY  2010   left knee    Allergies  Allergen  Reactions  . Cortisone       Medication List       Accurate as of 10/15/15  7:05 PM. Always use your most recent med list.          acetaminophen 325 MG tablet Commonly known as:  TYLENOL Take 650 mg by mouth every 4 (four) hours as needed for moderate pain.   antiseptic oral rinse Liqd 15 mLs by Mouth Rinse route as needed for dry mouth.   aspirin 81 MG chewable tablet Chew 81 mg by mouth every morning.   carvedilol 12.5  MG tablet Commonly known as:  COREG Take 12.5 mg by mouth 2 (two) times daily with a meal.   cloNIDine 0.1 MG tablet Commonly known as:  CATAPRES Take 0.1 mg by mouth every morning.   diltiazem 120 MG 24 hr capsule Commonly known as:  CARDIZEM CD Take 120 mg by mouth daily.   DULoxetine 20 MG capsule Commonly known as:  CYMBALTA Take 20 mg by mouth daily.   famotidine 20 MG tablet Commonly known as:  PEPCID Take 20 mg by mouth daily as needed.   feeding supplement Liqd Take 1 Container by mouth daily as needed.   ipratropium-albuterol 0.5-2.5 (3) MG/3ML Soln Commonly known as:  DUONEB Take 3 mLs by nebulization every 6 (six) hours as needed. For shortness of breath and wheezing.   levothyroxine 50 MCG tablet Commonly known as:  SYNTHROID, LEVOTHROID Take 50 mcg by mouth every morning.   losartan 50 MG tablet Commonly known as:  COZAAR Take 50 mg by mouth daily.   Melatonin 3 MG Tbdp Take 3 mg by mouth at bedtime.   multivitamin with minerals Tabs tablet Take 1 tablet by mouth every morning.   PEDIA-LAX 2.8 g Supp Generic drug:  Glycerin (Laxative) Place rectally as needed (for constipation).   polyethylene glycol packet Commonly known as:  MIRALAX / GLYCOLAX Take 17 g by mouth daily as needed.   polyvinyl alcohol 1.4 % ophthalmic solution Commonly known as:  LIQUIFILM TEARS Place 1 drop into both eyes 4 (four) times daily as needed.   tiotropium 18 MCG inhalation capsule Commonly known as:  SPIRIVA Place 18 mcg into inhaler and inhale daily.   VENTOLIN HFA 108 (90 Base) MCG/ACT inhaler Generic drug:  albuterol Inhale 2 puffs into the lungs daily. Take 2 puffs Every 6 hours as needed for shortness of breath and wheezing       Review of Systems  Constitutional: Positive for fatigue. Negative for chills, diaphoresis and fever.       Generalized weakness.   HENT: Positive for hearing loss. Negative for congestion, ear discharge and ear pain.   Eyes:  Negative for pain, discharge and redness.  Respiratory: Positive for cough and wheezing. Negative for shortness of breath and stridor.        Chronic hacking cough occasionally  Cardiovascular: Positive for leg swelling. Negative for chest pain and palpitations.       Trace in ankles.   Gastrointestinal: Negative for abdominal pain, constipation, diarrhea, nausea and vomiting.       Lower abd incisional hernia.    Genitourinary: Positive for frequency. Negative for dysuria, flank pain, hematuria and urgency.       Q2-3 hours average.   Musculoskeletal: Positive for back pain. Negative for myalgias and neck pain.       Improved back pain. She is able to ambulates with walker on unit.  Resolution of Left hip pain since 11/25/14.  Skin: Negative for rash.       Lower abd incisional hernia-reducible.  Neurological: Positive for weakness. Negative for dizziness, tremors, seizures and headaches.  Psychiatric/Behavioral: Negative for hallucinations and suicidal ideas. The patient is not nervous/anxious.        C/o sleeps too much.    Immunization History  Administered Date(s) Administered  . Influenza,inj,Quad PF,36+ Mos 12/19/2013  . Influenza-Unspecified 01/21/2013, 12/28/2014  . PPD Test 01/02/2014  . Pneumococcal Polysaccharide-23 11/24/1999  . Td 12/28/1994  . Tdap 04/08/2013   Pertinent  Health Maintenance Due  Topic Date Due  . DEXA SCAN  05/24/1990  . PNA vac Low Risk Adult (2 of 2 - PCV13) 11/23/2000  . INFLUENZA VACCINE  10/22/2015   Fall Risk  12/03/2014 06/08/2014  Falls in the past year? Yes No  Number falls in past yr: 1 -  Injury with Fall? No -  Risk for fall due to : History of fall(s);Impaired balance/gait;Impaired mobility Impaired balance/gait  Follow up Falls evaluation completed -   Functional Status Survey:    Vitals:   10/15/15 1045  BP: (!) 150/70  Pulse: 74  Resp: (!) 22  Temp: 98 F (36.7 C)  SpO2: 97%  Weight: 125 lb (56.7 kg)  Height: 5\' 2"   (1.575 m)   Body mass index is 22.86 kg/m. Physical Exam  Constitutional: She is oriented to person, place, and time. She appears well-developed and well-nourished. No distress.  HENT:  Head: Normocephalic and atraumatic.  Right Ear: External ear normal.  Left Ear: External ear normal.  Nose: Nose normal.  Mouth/Throat: Oropharynx is clear and moist. No oropharyngeal exudate.  Eyes: Conjunctivae and EOM are normal. Pupils are equal, round, and reactive to light. Right eye exhibits no discharge. Left eye exhibits no discharge. No scleral icterus.  Neck: Normal range of motion. Neck supple. No JVD present. No tracheal deviation present. No thyromegaly present.  Cardiovascular: Normal rate, regular rhythm, normal heart sounds and intact distal pulses.   No murmur heard. Pulmonary/Chest: Effort normal. No stridor. No respiratory distress. She has decreased breath sounds. She has wheezes. She has rales. She exhibits no tenderness.  bibasilar  Abdominal: Soft. She exhibits no distension and no mass. There is no tenderness.  Musculoskeletal: Normal range of motion. She exhibits edema and tenderness.  Ankles and feet have trace edema. Mid back pain.  Unstable gait. Uses rolling walker.  Neurological: She is alert and oriented to person, place, and time. No cranial nerve deficit. She exhibits normal muscle tone. Coordination normal.  Left hemiparesis  Skin: Skin is warm and dry. No rash noted. She is not diaphoretic. No erythema. No pallor.  Mid abd surgical scar-healed-lower abd reducible incisional hernia-the patient has concerns of cosmetic reason but denied pain.  Left mastectomy Scalp skin lesion above the left ear.   Psychiatric: She has a normal mood and affect. Her behavior is normal. Judgment and thought content normal.  Repetitive.     Labs reviewed:  Recent Labs  02/04/15 03/29/15 05/07/15  NA 140 140 136*  K 4.9 4.3 4.3  BUN 20 25* 25*  CREATININE 0.6 0.6 0.7    Recent  Labs  02/04/15 03/29/15 05/07/15  AST 16 19 18   ALT 10 16 14   ALKPHOS 89 80 108    Recent Labs  12/06/14 02/04/15 03/29/15 05/07/15  WBC 5.3  --  6.0 6.7  HGB 15.0 14.8 15.4 15.2  HCT 43 46 45 46  PLT 252 321 379 352   Lab  Results  Component Value Date   TSH 2.30 02/04/2015   No results found for: HGBA1C Lab Results  Component Value Date   CHOL 121 11/22/2012   HDL 40 11/22/2012   LDLCALC 65 11/22/2012   TRIG 81 11/22/2012    Significant Diagnostic Results in last 30 days:  No results found.  Assessment/Plan There are no diagnoses linked to this encounter.Fatigue C/o generalized fatigue, malaise since the URI early this month, denied cough, pain, palpitation, insomnia, focal weakness. Will update CBC, CMP, TSH, UA C/S  HTN (hypertension) Controlled, continue Clonidine 0.62mf, Carvedilol 12.5mg  bid, Diltiazem 120mg , Losartan 50mg  daily, monitor Bp   COPD (chronic obstructive pulmonary disease) CXR 12/14/14 ruled out PNA, last treated with  7 day course of Augmentin 875mg  q12h Continue Spiriva daily, prn Ventolin 2 puffs q6h 3 weeks ago. Treated Congestive cough, yellow phlegm, malaise 2-3 days, denied chest pain, no O2 desaturation, afebrile, will obtain CXR, CBC, CMP, Augmentin 875mg  q12hr x7 days stable. Chronic hacking cough occasionally. Diffused coarse expiratory wheezes noted, adding Medrol dose pk     GERD (gastroesophageal reflux disease) Stable, continue Famotidine 20mg  daily.    Constipation Controlled. Continue MiraLax bid  Hypothyroidism TSH 2.599 12/06/14, 2.295 02/04/15. Continue Levothyroxine 46mcg daily   Depression mood is stabilized, switched from Zoloft to Cymbalta in setting of back pain, reduced Cymbalta 20mg  daily due to c/o feeling shaky-improved. Observe.    Lower back pain Pain is better controlled, continue Norco prn, Cymbalta 20mg     Edema Minimal,     Family/ staff Communication: continue SNF for care needs.    Labs/tests ordered:  CBC, CMP, TSH, UA C/S

## 2015-10-17 DIAGNOSIS — M545 Low back pain: Secondary | ICD-10-CM | POA: Diagnosis not present

## 2015-10-17 DIAGNOSIS — M810 Age-related osteoporosis without current pathological fracture: Secondary | ICD-10-CM | POA: Diagnosis not present

## 2015-10-17 DIAGNOSIS — E870 Hyperosmolality and hypernatremia: Secondary | ICD-10-CM | POA: Diagnosis not present

## 2015-10-17 DIAGNOSIS — E039 Hypothyroidism, unspecified: Secondary | ICD-10-CM | POA: Diagnosis not present

## 2015-10-17 LAB — CBC AND DIFFERENTIAL
HEMATOCRIT: 52 % — AB (ref 36–46)
Hemoglobin: 17.3 g/dL — AB (ref 12.0–16.0)
PLATELETS: 347 10*3/uL (ref 150–399)
WBC: 5.6 10^3/mL

## 2015-10-17 LAB — HEPATIC FUNCTION PANEL
ALT: 10 U/L (ref 7–35)
AST: 18 U/L (ref 13–35)
Alkaline Phosphatase: 104 U/L (ref 25–125)
BILIRUBIN, TOTAL: 0.6 mg/dL

## 2015-10-17 LAB — BASIC METABOLIC PANEL
BUN: 88 mg/dL — AB (ref 4–21)
Creatinine: 0.3 mg/dL — AB (ref 0.5–1.1)
Glucose: 88 mg/dL
Potassium: 4.8 mmol/L (ref 3.4–5.3)
Sodium: 138 mmol/L (ref 137–147)

## 2015-10-17 LAB — TSH: TSH: 1.79 u[IU]/mL (ref 0.41–5.90)

## 2015-10-18 ENCOUNTER — Encounter: Payer: Self-pay | Admitting: Nurse Practitioner

## 2015-10-18 DIAGNOSIS — D751 Secondary polycythemia: Secondary | ICD-10-CM | POA: Insufficient documentation

## 2015-10-23 DIAGNOSIS — R2689 Other abnormalities of gait and mobility: Secondary | ICD-10-CM | POA: Diagnosis not present

## 2015-10-23 DIAGNOSIS — R2681 Unsteadiness on feet: Secondary | ICD-10-CM | POA: Diagnosis not present

## 2015-10-23 DIAGNOSIS — Z9181 History of falling: Secondary | ICD-10-CM | POA: Diagnosis not present

## 2015-10-23 DIAGNOSIS — E039 Hypothyroidism, unspecified: Secondary | ICD-10-CM | POA: Diagnosis not present

## 2015-10-23 DIAGNOSIS — G4700 Insomnia, unspecified: Secondary | ICD-10-CM | POA: Diagnosis not present

## 2015-10-23 DIAGNOSIS — E870 Hyperosmolality and hypernatremia: Secondary | ICD-10-CM | POA: Diagnosis not present

## 2015-10-23 DIAGNOSIS — M25512 Pain in left shoulder: Secondary | ICD-10-CM | POA: Diagnosis not present

## 2015-10-23 DIAGNOSIS — I6350 Cerebral infarction due to unspecified occlusion or stenosis of unspecified cerebral artery: Secondary | ICD-10-CM | POA: Diagnosis not present

## 2015-10-23 DIAGNOSIS — M6281 Muscle weakness (generalized): Secondary | ICD-10-CM | POA: Diagnosis not present

## 2015-10-24 DIAGNOSIS — I6350 Cerebral infarction due to unspecified occlusion or stenosis of unspecified cerebral artery: Secondary | ICD-10-CM | POA: Diagnosis not present

## 2015-10-25 DIAGNOSIS — M6281 Muscle weakness (generalized): Secondary | ICD-10-CM | POA: Diagnosis not present

## 2015-10-25 DIAGNOSIS — Z9181 History of falling: Secondary | ICD-10-CM | POA: Diagnosis not present

## 2015-10-25 DIAGNOSIS — M25512 Pain in left shoulder: Secondary | ICD-10-CM | POA: Diagnosis not present

## 2015-10-25 DIAGNOSIS — E039 Hypothyroidism, unspecified: Secondary | ICD-10-CM | POA: Diagnosis not present

## 2015-10-25 DIAGNOSIS — R2689 Other abnormalities of gait and mobility: Secondary | ICD-10-CM | POA: Diagnosis not present

## 2015-10-25 DIAGNOSIS — R2681 Unsteadiness on feet: Secondary | ICD-10-CM | POA: Diagnosis not present

## 2015-10-28 DIAGNOSIS — Z9181 History of falling: Secondary | ICD-10-CM | POA: Diagnosis not present

## 2015-10-28 DIAGNOSIS — R2689 Other abnormalities of gait and mobility: Secondary | ICD-10-CM | POA: Diagnosis not present

## 2015-10-28 DIAGNOSIS — M25512 Pain in left shoulder: Secondary | ICD-10-CM | POA: Diagnosis not present

## 2015-10-28 DIAGNOSIS — M6281 Muscle weakness (generalized): Secondary | ICD-10-CM | POA: Diagnosis not present

## 2015-10-28 DIAGNOSIS — E039 Hypothyroidism, unspecified: Secondary | ICD-10-CM | POA: Diagnosis not present

## 2015-10-28 DIAGNOSIS — R2681 Unsteadiness on feet: Secondary | ICD-10-CM | POA: Diagnosis not present

## 2015-10-30 DIAGNOSIS — M6281 Muscle weakness (generalized): Secondary | ICD-10-CM | POA: Diagnosis not present

## 2015-10-30 DIAGNOSIS — R2681 Unsteadiness on feet: Secondary | ICD-10-CM | POA: Diagnosis not present

## 2015-10-30 DIAGNOSIS — Z9181 History of falling: Secondary | ICD-10-CM | POA: Diagnosis not present

## 2015-10-30 DIAGNOSIS — E039 Hypothyroidism, unspecified: Secondary | ICD-10-CM | POA: Diagnosis not present

## 2015-10-30 DIAGNOSIS — M25512 Pain in left shoulder: Secondary | ICD-10-CM | POA: Diagnosis not present

## 2015-10-30 DIAGNOSIS — R2689 Other abnormalities of gait and mobility: Secondary | ICD-10-CM | POA: Diagnosis not present

## 2015-11-01 DIAGNOSIS — E039 Hypothyroidism, unspecified: Secondary | ICD-10-CM | POA: Diagnosis not present

## 2015-11-01 DIAGNOSIS — Z9181 History of falling: Secondary | ICD-10-CM | POA: Diagnosis not present

## 2015-11-01 DIAGNOSIS — R2689 Other abnormalities of gait and mobility: Secondary | ICD-10-CM | POA: Diagnosis not present

## 2015-11-01 DIAGNOSIS — R2681 Unsteadiness on feet: Secondary | ICD-10-CM | POA: Diagnosis not present

## 2015-11-01 DIAGNOSIS — M6281 Muscle weakness (generalized): Secondary | ICD-10-CM | POA: Diagnosis not present

## 2015-11-01 DIAGNOSIS — M25512 Pain in left shoulder: Secondary | ICD-10-CM | POA: Diagnosis not present

## 2015-11-05 DIAGNOSIS — R2689 Other abnormalities of gait and mobility: Secondary | ICD-10-CM | POA: Diagnosis not present

## 2015-11-05 DIAGNOSIS — E039 Hypothyroidism, unspecified: Secondary | ICD-10-CM | POA: Diagnosis not present

## 2015-11-05 DIAGNOSIS — Z9181 History of falling: Secondary | ICD-10-CM | POA: Diagnosis not present

## 2015-11-05 DIAGNOSIS — R2681 Unsteadiness on feet: Secondary | ICD-10-CM | POA: Diagnosis not present

## 2015-11-05 DIAGNOSIS — M6281 Muscle weakness (generalized): Secondary | ICD-10-CM | POA: Diagnosis not present

## 2015-11-05 DIAGNOSIS — M25512 Pain in left shoulder: Secondary | ICD-10-CM | POA: Diagnosis not present

## 2015-11-06 DIAGNOSIS — R2689 Other abnormalities of gait and mobility: Secondary | ICD-10-CM | POA: Diagnosis not present

## 2015-11-06 DIAGNOSIS — Z9181 History of falling: Secondary | ICD-10-CM | POA: Diagnosis not present

## 2015-11-06 DIAGNOSIS — E039 Hypothyroidism, unspecified: Secondary | ICD-10-CM | POA: Diagnosis not present

## 2015-11-06 DIAGNOSIS — M6281 Muscle weakness (generalized): Secondary | ICD-10-CM | POA: Diagnosis not present

## 2015-11-06 DIAGNOSIS — R2681 Unsteadiness on feet: Secondary | ICD-10-CM | POA: Diagnosis not present

## 2015-11-06 DIAGNOSIS — M25512 Pain in left shoulder: Secondary | ICD-10-CM | POA: Diagnosis not present

## 2015-11-08 ENCOUNTER — Encounter: Payer: Self-pay | Admitting: Internal Medicine

## 2015-11-08 ENCOUNTER — Non-Acute Institutional Stay (SKILLED_NURSING_FACILITY): Payer: Medicare Other | Admitting: Internal Medicine

## 2015-11-08 DIAGNOSIS — E039 Hypothyroidism, unspecified: Secondary | ICD-10-CM

## 2015-11-08 DIAGNOSIS — K219 Gastro-esophageal reflux disease without esophagitis: Secondary | ICD-10-CM | POA: Diagnosis not present

## 2015-11-08 DIAGNOSIS — M5440 Lumbago with sciatica, unspecified side: Secondary | ICD-10-CM

## 2015-11-08 DIAGNOSIS — R531 Weakness: Secondary | ICD-10-CM | POA: Diagnosis not present

## 2015-11-08 DIAGNOSIS — I10 Essential (primary) hypertension: Secondary | ICD-10-CM

## 2015-11-08 MED ORDER — LIDOCAINE 4 % EX PTCH: 1.0000 | MEDICATED_PATCH | Freq: Every day | CUTANEOUS | 11 refills | Status: DC

## 2015-11-08 MED ORDER — NAPROXEN SODIUM 220 MG PO TABS: ORAL_TABLET | ORAL | 5 refills | Status: DC

## 2015-11-08 NOTE — Progress Notes (Signed)
Location:   [facility] Room Number: [place] of Service:  SNF [phone]) Provider:  Jeanmarie Hubert, MD  Patient Care Team: Estill Dooms, MD as PCP - General (Internal Medicine) Gaynelle Arabian, MD as Consulting Physician (Orthopedic Surgery) Man Otho Darner, NP as Nurse Practitioner (Nurse Practitioner) [place] (Skilled Nursing and [place])  Extended Emergency Contact Information Primary Emergency Contact: Johnston,Betsy Address: [place], [place] of [place] Phone: [phone] Relation: Daughter Secondary Emergency Contact: Rey,Lindsey  [place] Phone: [phone] Relation: Daughter  Code Status:  DNR Goals of care: Advanced Directive information Advanced Directives 11/08/2015  Does patient have an advance directive? Yes  Type of Paramedic of [place];Living will;Out of facility DNR (pink MOST or yellow form)  Does patient want to make changes to advanced directive? -  Copy of advanced directive(s) in chart? Yes  Pre-existing out of facility DNR order (yellow form or pink MOST form) -     Chief Complaint  Patient presents with  . Medical Management of Chronic Issues    routine medication management    HPI:  Pt is a 82 y.o. female seen today for medical management of chronic diseases.    Midline low back pain with sciatica, sciatica laterality unspecified - Continues to have low back pain with some radiation into the legs. Currently using icy hot patch which she thinks helps a little bit, but not enough. She does want to continue the patch. She is on Cymbalta 20 mg chronically. She is not receiving any anti-inflammatory medications regularly.  Essential hypertension - controlled -   Gastroesophageal reflux disease without esophagitis - denies dysphagia or odynophagia  Hypothyroidism, unspecified hypothyroidism type - compensated     Past  Medical History:  Diagnosis Date  . Back pain, thoracic 01/12/2014  . Cancer ([place]) 1980   Breast cancer  . Constipation 11/18/2012  . COPD (chronic obstructive pulmonary disease) ([place])   . CVA (cerebral infarction) 06/08/2011   Following abdominal surgery for bowel obstruction   . Depression 12/26/2013  . DVT (deep venous thrombosis) ([place]) 11/18/2012  . Fall 02/07/2013  . GERD (gastroesophageal reflux disease) 03/30/2014  . Granuloma annulare 06/08/2014  . HTN (hypertension) 11/18/2012   01/15/14 Bun/creat 14/0.60   . Hypertension   . Hypothyroidism   . Incisional hernia, without obstruction or gangrene 05/27/2014   Lower abd from previous surgery-2-3 small incisional hernias which are reducible-no incarceration or pain complained.    . Insomnia 11/18/2012  . Left hemiparesis ([place]) 06/08/2014  . Osteoporosis   . Shortness of breath   . Thoracic spine fracture [facility]) 12/18/2013   01/05/14 T 10 Dr. Newman Pies: PCP managing pain.  F/u 2 months(suggested that if the pain is "not too bad" and seems to be improving that she "live with it" and give a change to heal without intervention.  12/18/13 X-ray Thoracic spine: Compression deformity at the level of T10. Comparing back to a chest x-ray from 12/09/2013, this appears new in the interval.       . TIA (transient ischemic attack)   . Ventral hernia 06/08/2014   Past Surgical History:  Procedure Laterality Date  . ABDOMINAL HYSTERECTOMY    . CATARACT EXTRACTION, BILATERAL    . EXPLORATORY LAPAROTOMY W/ BOWEL RESECTION     10/19/12-11/01/12 [facility] for exploratory laparotomty with extensive lysis of adhesion and segmental small  bowel resection with end to end anastomosis per Dr. Isidore Moos 10/22/12  . MASTECTOMY  1980   left side  . TONSILLECTOMY    . TOTAL KNEE ARTHROPLASTY  2010   left knee    Allergies  Allergen Reactions  . Cortisone       Medication List       Accurate as of 11/08/15 12:15 PM. Always use your most recent med list.            acetaminophen 325 MG tablet Commonly known as:  TYLENOL Take 650 mg by mouth every 4 (four) hours as needed for moderate pain.   antiseptic oral rinse Liqd 15 mLs by Mouth Rinse route as needed for dry mouth.   aspirin 81 MG chewable tablet Chew 81 mg by mouth every morning.   carvedilol 12.5 MG tablet Commonly known as:  COREG Take 12.5 mg by mouth 2 (two) times daily with a meal.   cloNIDine 0.1 MG tablet Commonly known as:  CATAPRES Take 0.1 mg by mouth every morning.   diltiazem 120 MG 24 hr capsule Commonly known as:  CARDIZEM CD Take 120 mg by mouth daily.   DULoxetine 20 MG capsule Commonly known as:  CYMBALTA Take 20 mg by mouth daily.   famotidine 20 MG tablet Commonly known as:  PEPCID Take 20 mg by mouth daily as needed.   feeding supplement Liqd Take 1 Container by mouth daily as needed.   HYDROcodone-acetaminophen 5-325 MG tablet Commonly known as:  NORCO/VICODIN Take 1 tablet by mouth. Take one tablet every 6 hours as needed for pain   ipratropium-albuterol 0.5-2.5 (3) MG/3ML Soln Commonly known as:  DUONEB Take 3 mLs by nebulization every 6 (six) hours as needed. For shortness of breath and wheezing.   levothyroxine 50 MCG tablet Commonly known as:  SYNTHROID, LEVOTHROID Take 50 mcg by mouth every morning.   losartan 50 MG tablet Commonly known as:  COZAAR Take 50 mg by mouth daily.   Melatonin 3 MG Tbdp Take 3 mg by mouth at bedtime.   multivitamin with minerals Tabs tablet Take 1 tablet by mouth every morning.   PEDIA-LAX 2.8 g Supp Generic drug:  Glycerin (Laxative) Place rectally as needed (for constipation).   polyethylene glycol packet Commonly known as:  MIRALAX / GLYCOLAX Take 17 g by mouth daily as needed.   polyvinyl alcohol 1.4 % ophthalmic solution Commonly known as:  LIQUIFILM TEARS Place 1 drop into both eyes 4 (four) times daily as needed.   tiotropium 18 MCG inhalation capsule Commonly known as:   SPIRIVA Place 18 mcg into inhaler and inhale daily.   VENTOLIN HFA 108 (90 Base) MCG/ACT inhaler Generic drug:  albuterol Inhale 2 puffs into the lungs daily. Take 2 puffs Every 6 hours as needed for shortness of breath and wheezing       Review of Systems  Constitutional: Positive for fatigue. Negative for chills, diaphoresis and fever.       Generalized weakness.   HENT: Positive for hearing loss. Negative for congestion, ear discharge and ear pain.   Eyes: Negative for pain, discharge and redness.  Respiratory: Positive for cough and wheezing. Negative for shortness of breath and stridor.        Chronic hacking cough occasionally  Cardiovascular: Positive for leg swelling. Negative for chest pain and palpitations.       Trace in ankles.   Gastrointestinal: Negative for abdominal pain, constipation, diarrhea, nausea and vomiting.  History of GERD . Lower abd incisional hernia.   Genitourinary: Positive for frequency. Negative for dysuria, flank pain, hematuria and urgency.       Q2-3 hours average.   Musculoskeletal: Positive for back pain. Negative for myalgias and neck pain.       Complains of increase in low back pain. She is able to ambulate with walker on unit.  Resolution of Left hip pain since 11/25/14.  Skin: Negative for rash.       Lower abd incisional hernia-reducible.  Neurological: Positive for weakness. Negative for dizziness, tremors, seizures and headaches.  Psychiatric/Behavioral: Negative for hallucinations and suicidal ideas. The patient is not nervous/anxious.        C/o sleeps too much.    Immunization History  Administered Date(s) Administered  . Influenza,inj,Quad PF,36+ Mos 12/19/2013  . Influenza-Unspecified 01/21/2013, 12/28/2014  . PPD Test 01/02/2014  . Pneumococcal Polysaccharide-23 11/24/1999  . Td 12/28/1994  . Tdap 04/08/2013   Pertinent  Health Maintenance Due  Topic Date Due  . DEXA SCAN  05/24/1990  . PNA vac Low Risk Adult (2 of 2  - PCV13) 11/23/2000  . INFLUENZA VACCINE  10/22/2015   Fall Risk  12/03/2014 06/08/2014  Falls in the past year? Yes No  Number falls in past yr: 1 -  Injury with Fall? No -  Risk for fall due to : History of fall(s);Impaired balance/gait;Impaired mobility Impaired balance/gait  Follow up Falls evaluation completed -   Functional Status Survey:    Vitals:   11/08/15 1154  BP: (!) 142/79  Pulse: 69  Resp: 18  Temp: 98.2 F (36.8 C)  SpO2: 99%  Weight: 119 lb (54 kg)  Height: 5\' 2"  (1.575 m)   Body mass index is 21.77 kg/m. Physical Exam  Labs reviewed:  Recent Labs  03/29/15 05/07/15 10/17/15  NA 140 136* 138  K 4.3 4.3 4.8  BUN 25* 25* 88*  CREATININE 0.6 0.7 0.3*    Recent Labs  03/29/15 05/07/15 10/17/15  AST 19 18 18   ALT 16 14 10   ALKPHOS 80 108 104    Recent Labs  03/29/15 05/07/15 10/17/15  WBC 6.0 6.7 5.6  HGB 15.4 15.2 17.3*  HCT 45 46 52*  PLT 379 352 347   Lab Results  Component Value Date   TSH 1.79 10/17/2015   No results found for: HGBA1C Lab Results  Component Value Date   CHOL 121 11/22/2012   HDL 40 11/22/2012   LDLCALC 65 11/22/2012   TRIG 81 11/22/2012    Assessment/Plan  1. Midline low back pain with sciatica, sciatica laterality unspecified She wants to know if she should see chiropractor for Laser therapy for her back. I asked her to wait and see if today's changed in medication is helpful. - naproxen sodium (ALEVE) 220 MG tablet; One up to twice daily to help arthritis  Dispense: 60 tablet; Refill: 5 - Lidocaine (ASPERCREME LIDOCAINE) 4 % PTCH; Apply 1 patch topically daily. to help relieve back pain  Dispense: 30 patch; Refill: 11  2. Essential hypertension Controlled    3. Gastroesophageal reflux disease without esophagitis controlled  4. Hypothyroidism, unspecified hypothyroidism type compensated

## 2015-11-11 DIAGNOSIS — Z9181 History of falling: Secondary | ICD-10-CM | POA: Diagnosis not present

## 2015-11-11 DIAGNOSIS — R2681 Unsteadiness on feet: Secondary | ICD-10-CM | POA: Diagnosis not present

## 2015-11-11 DIAGNOSIS — E039 Hypothyroidism, unspecified: Secondary | ICD-10-CM | POA: Diagnosis not present

## 2015-11-11 DIAGNOSIS — R2689 Other abnormalities of gait and mobility: Secondary | ICD-10-CM | POA: Diagnosis not present

## 2015-11-11 DIAGNOSIS — M6281 Muscle weakness (generalized): Secondary | ICD-10-CM | POA: Diagnosis not present

## 2015-11-11 DIAGNOSIS — M25512 Pain in left shoulder: Secondary | ICD-10-CM | POA: Diagnosis not present

## 2015-11-12 DIAGNOSIS — Z9181 History of falling: Secondary | ICD-10-CM | POA: Diagnosis not present

## 2015-11-12 DIAGNOSIS — R2681 Unsteadiness on feet: Secondary | ICD-10-CM | POA: Diagnosis not present

## 2015-11-12 DIAGNOSIS — M25512 Pain in left shoulder: Secondary | ICD-10-CM | POA: Diagnosis not present

## 2015-11-12 DIAGNOSIS — M6281 Muscle weakness (generalized): Secondary | ICD-10-CM | POA: Diagnosis not present

## 2015-11-12 DIAGNOSIS — R2689 Other abnormalities of gait and mobility: Secondary | ICD-10-CM | POA: Diagnosis not present

## 2015-11-12 DIAGNOSIS — E039 Hypothyroidism, unspecified: Secondary | ICD-10-CM | POA: Diagnosis not present

## 2015-11-15 DIAGNOSIS — M25512 Pain in left shoulder: Secondary | ICD-10-CM | POA: Diagnosis not present

## 2015-11-15 DIAGNOSIS — M6281 Muscle weakness (generalized): Secondary | ICD-10-CM | POA: Diagnosis not present

## 2015-11-15 DIAGNOSIS — E039 Hypothyroidism, unspecified: Secondary | ICD-10-CM | POA: Diagnosis not present

## 2015-11-15 DIAGNOSIS — R2681 Unsteadiness on feet: Secondary | ICD-10-CM | POA: Diagnosis not present

## 2015-11-15 DIAGNOSIS — Z9181 History of falling: Secondary | ICD-10-CM | POA: Diagnosis not present

## 2015-11-15 DIAGNOSIS — R2689 Other abnormalities of gait and mobility: Secondary | ICD-10-CM | POA: Diagnosis not present

## 2015-11-19 DIAGNOSIS — M6281 Muscle weakness (generalized): Secondary | ICD-10-CM | POA: Diagnosis not present

## 2015-11-19 DIAGNOSIS — M25512 Pain in left shoulder: Secondary | ICD-10-CM | POA: Diagnosis not present

## 2015-11-19 DIAGNOSIS — R2689 Other abnormalities of gait and mobility: Secondary | ICD-10-CM | POA: Diagnosis not present

## 2015-11-19 DIAGNOSIS — Z9181 History of falling: Secondary | ICD-10-CM | POA: Diagnosis not present

## 2015-11-19 DIAGNOSIS — R2681 Unsteadiness on feet: Secondary | ICD-10-CM | POA: Diagnosis not present

## 2015-11-19 DIAGNOSIS — E039 Hypothyroidism, unspecified: Secondary | ICD-10-CM | POA: Diagnosis not present

## 2015-11-21 DIAGNOSIS — R2681 Unsteadiness on feet: Secondary | ICD-10-CM | POA: Diagnosis not present

## 2015-11-21 DIAGNOSIS — M6281 Muscle weakness (generalized): Secondary | ICD-10-CM | POA: Diagnosis not present

## 2015-11-21 DIAGNOSIS — M25512 Pain in left shoulder: Secondary | ICD-10-CM | POA: Diagnosis not present

## 2015-11-21 DIAGNOSIS — R2689 Other abnormalities of gait and mobility: Secondary | ICD-10-CM | POA: Diagnosis not present

## 2015-11-21 DIAGNOSIS — Z9181 History of falling: Secondary | ICD-10-CM | POA: Diagnosis not present

## 2015-11-21 DIAGNOSIS — E039 Hypothyroidism, unspecified: Secondary | ICD-10-CM | POA: Diagnosis not present

## 2015-11-27 DIAGNOSIS — E039 Hypothyroidism, unspecified: Secondary | ICD-10-CM | POA: Diagnosis not present

## 2015-11-27 DIAGNOSIS — G4700 Insomnia, unspecified: Secondary | ICD-10-CM | POA: Diagnosis not present

## 2015-11-27 DIAGNOSIS — Z9181 History of falling: Secondary | ICD-10-CM | POA: Diagnosis not present

## 2015-11-27 DIAGNOSIS — E870 Hyperosmolality and hypernatremia: Secondary | ICD-10-CM | POA: Diagnosis not present

## 2015-11-27 DIAGNOSIS — I6350 Cerebral infarction due to unspecified occlusion or stenosis of unspecified cerebral artery: Secondary | ICD-10-CM | POA: Diagnosis not present

## 2015-11-27 DIAGNOSIS — M6281 Muscle weakness (generalized): Secondary | ICD-10-CM | POA: Diagnosis not present

## 2015-11-27 DIAGNOSIS — M25512 Pain in left shoulder: Secondary | ICD-10-CM | POA: Diagnosis not present

## 2015-11-27 DIAGNOSIS — R2689 Other abnormalities of gait and mobility: Secondary | ICD-10-CM | POA: Diagnosis not present

## 2015-11-27 DIAGNOSIS — R2681 Unsteadiness on feet: Secondary | ICD-10-CM | POA: Diagnosis not present

## 2015-11-29 DIAGNOSIS — M25512 Pain in left shoulder: Secondary | ICD-10-CM | POA: Diagnosis not present

## 2015-11-29 DIAGNOSIS — Z9181 History of falling: Secondary | ICD-10-CM | POA: Diagnosis not present

## 2015-11-29 DIAGNOSIS — R2689 Other abnormalities of gait and mobility: Secondary | ICD-10-CM | POA: Diagnosis not present

## 2015-11-29 DIAGNOSIS — R2681 Unsteadiness on feet: Secondary | ICD-10-CM | POA: Diagnosis not present

## 2015-11-29 DIAGNOSIS — E039 Hypothyroidism, unspecified: Secondary | ICD-10-CM | POA: Diagnosis not present

## 2015-11-29 DIAGNOSIS — M6281 Muscle weakness (generalized): Secondary | ICD-10-CM | POA: Diagnosis not present

## 2015-12-02 DIAGNOSIS — E039 Hypothyroidism, unspecified: Secondary | ICD-10-CM | POA: Diagnosis not present

## 2015-12-02 DIAGNOSIS — R2681 Unsteadiness on feet: Secondary | ICD-10-CM | POA: Diagnosis not present

## 2015-12-02 DIAGNOSIS — M6281 Muscle weakness (generalized): Secondary | ICD-10-CM | POA: Diagnosis not present

## 2015-12-02 DIAGNOSIS — R2689 Other abnormalities of gait and mobility: Secondary | ICD-10-CM | POA: Diagnosis not present

## 2015-12-02 DIAGNOSIS — Z9181 History of falling: Secondary | ICD-10-CM | POA: Diagnosis not present

## 2015-12-02 DIAGNOSIS — M25512 Pain in left shoulder: Secondary | ICD-10-CM | POA: Diagnosis not present

## 2015-12-04 DIAGNOSIS — M25512 Pain in left shoulder: Secondary | ICD-10-CM | POA: Diagnosis not present

## 2015-12-04 DIAGNOSIS — R2681 Unsteadiness on feet: Secondary | ICD-10-CM | POA: Diagnosis not present

## 2015-12-04 DIAGNOSIS — E039 Hypothyroidism, unspecified: Secondary | ICD-10-CM | POA: Diagnosis not present

## 2015-12-04 DIAGNOSIS — R2689 Other abnormalities of gait and mobility: Secondary | ICD-10-CM | POA: Diagnosis not present

## 2015-12-04 DIAGNOSIS — Z9181 History of falling: Secondary | ICD-10-CM | POA: Diagnosis not present

## 2015-12-04 DIAGNOSIS — M6281 Muscle weakness (generalized): Secondary | ICD-10-CM | POA: Diagnosis not present

## 2015-12-05 ENCOUNTER — Non-Acute Institutional Stay (SKILLED_NURSING_FACILITY): Payer: Medicare Other | Admitting: Nurse Practitioner

## 2015-12-05 ENCOUNTER — Encounter: Payer: Self-pay | Admitting: Nurse Practitioner

## 2015-12-05 DIAGNOSIS — M25512 Pain in left shoulder: Secondary | ICD-10-CM | POA: Diagnosis not present

## 2015-12-05 DIAGNOSIS — R609 Edema, unspecified: Secondary | ICD-10-CM | POA: Diagnosis not present

## 2015-12-05 DIAGNOSIS — F329 Major depressive disorder, single episode, unspecified: Secondary | ICD-10-CM

## 2015-12-05 DIAGNOSIS — M5440 Lumbago with sciatica, unspecified side: Secondary | ICD-10-CM

## 2015-12-05 DIAGNOSIS — I10 Essential (primary) hypertension: Secondary | ICD-10-CM

## 2015-12-05 DIAGNOSIS — K5900 Constipation, unspecified: Secondary | ICD-10-CM

## 2015-12-05 DIAGNOSIS — J410 Simple chronic bronchitis: Secondary | ICD-10-CM | POA: Diagnosis not present

## 2015-12-05 DIAGNOSIS — F32A Depression, unspecified: Secondary | ICD-10-CM

## 2015-12-05 DIAGNOSIS — M6281 Muscle weakness (generalized): Secondary | ICD-10-CM | POA: Diagnosis not present

## 2015-12-05 DIAGNOSIS — K219 Gastro-esophageal reflux disease without esophagitis: Secondary | ICD-10-CM | POA: Diagnosis not present

## 2015-12-05 DIAGNOSIS — R2681 Unsteadiness on feet: Secondary | ICD-10-CM | POA: Diagnosis not present

## 2015-12-05 DIAGNOSIS — E039 Hypothyroidism, unspecified: Secondary | ICD-10-CM | POA: Diagnosis not present

## 2015-12-05 DIAGNOSIS — R5382 Chronic fatigue, unspecified: Secondary | ICD-10-CM

## 2015-12-05 DIAGNOSIS — R2689 Other abnormalities of gait and mobility: Secondary | ICD-10-CM | POA: Diagnosis not present

## 2015-12-05 DIAGNOSIS — Z9181 History of falling: Secondary | ICD-10-CM | POA: Diagnosis not present

## 2015-12-05 NOTE — Assessment & Plan Note (Signed)
Pain is better controlled, continue Norco prn, Cymbalta 20mg 

## 2015-12-05 NOTE — Assessment & Plan Note (Signed)
Stable, continue Famotidine 20mg daily.   

## 2015-12-05 NOTE — Assessment & Plan Note (Signed)
Controlled, continue Clonidine 0.70mf, Carvedilol 12.5mg  bid, Diltiazem 120mg , Losartan 50mg  daily, monitor Bp

## 2015-12-05 NOTE — Assessment & Plan Note (Signed)
mood is stabilized, switched from Zoloft to Cymbalta in setting of back pain, reduced Cymbalta 20mg  daily due to c/o feeling shaky-improved. Observe.

## 2015-12-05 NOTE — Assessment & Plan Note (Signed)
Controlled. Continue MiraLax bid 

## 2015-12-05 NOTE — Assessment & Plan Note (Signed)
Continue Levothyroxine 63mcg daily, TSH 1.79 10/17/15

## 2015-12-05 NOTE — Assessment & Plan Note (Signed)
Minimal, mostly ankles/feet 

## 2015-12-05 NOTE — Assessment & Plan Note (Signed)
Chronic, 10/17/15 Hgb 17.3, Na 138, K 4.8, Bun 22, creat 0.34, TSH 1.79

## 2015-12-05 NOTE — Progress Notes (Signed)
Location:   [place] of Service:   SNF  Provider:  Kincaid Tiger  NP Jeanmarie Hubert, MD  Patient Care Team: Estill Dooms, MD as PCP - General (Internal Medicine) Gaynelle Arabian, MD as Consulting Physician (Orthopedic Surgery) Kaidance Pantoja Otho Darner, NP as Nurse Practitioner (Nurse Practitioner) [place] (Skilled Nursing and [place])  Extended Emergency Contact Information Primary Emergency Contact: Johnston,Betsy Address: [place], [place] Phone: [phone] Relation: Daughter Secondary Emergency Contact: Rey,Lindsey  [place] Phone: [phone] Relation: Daughter  Code Status:  DNR Goals of care: Advanced Directive information Advanced Directives 11/08/2015  Does patient have an advance directive? Yes  Type of Paramedic of [place];Living will;Out of facility DNR (pink MOST or yellow form)  Does patient want to make changes to advanced directive? -  Copy of advanced directive(s) in chart? Yes  Pre-existing out of facility DNR order (yellow form or pink MOST form) -     Chief Complaint  Patient presents with  . Medical Management of Chronic Issues    HPI:  Pt is a 82 y.o. female seen today for an acute visit for c/o fatigue, malaise since the URI treated early this month, hx of COPD, chronic cough, but no noted increased phlegm production, denied chest pain or palpitation. Hx of depression and back pain, better managed with Cymbalta. Her blood pressure is well controlled.    Past Medical History:  Diagnosis Date  . Back pain, thoracic 01/12/2014  . Cancer ([place]) 1980   Breast cancer  . Constipation 11/18/2012  . COPD (chronic obstructive pulmonary disease) ([place])   . CVA (cerebral infarction) 06/08/2011   Following abdominal surgery for bowel obstruction   . Depression 12/26/2013  . DVT (deep venous thrombosis) ([place]) 11/18/2012  . Fall  02/07/2013  . GERD (gastroesophageal reflux disease) 03/30/2014  . Granuloma annulare 06/08/2014  . HTN (hypertension) 11/18/2012   01/15/14 Bun/creat 14/0.60   . Hypertension   . Hypothyroidism   . Incisional hernia, without obstruction or gangrene 05/27/2014   Lower abd from previous surgery-2-3 small incisional hernias which are reducible-no incarceration or pain complained.    . Insomnia 11/18/2012  . Left hemiparesis ([place]) 06/08/2014  . Osteoporosis   . Shortness of breath   . Thoracic spine fracture [facility]) 12/18/2013   01/05/14 T 10 Dr. Newman Pies: PCP managing pain.  F/u 2 months(suggested that if the pain is "not too bad" and seems to be improving that she "live with it" and give a change to heal without intervention.  12/18/13 X-ray Thoracic spine: Compression deformity at the level of T10. Comparing back to a chest x-ray from 12/09/2013, this appears new in the interval.       . TIA (transient ischemic attack)   . Ventral hernia 06/08/2014   Past Surgical History:  Procedure Laterality Date  . ABDOMINAL HYSTERECTOMY    . CATARACT EXTRACTION, BILATERAL    . EXPLORATORY LAPAROTOMY W/ BOWEL RESECTION     10/19/12-11/01/12 [facility] for exploratory laparotomty with extensive lysis of adhesion and segmental small bowel resection with end to end anastomosis per Dr. Isidore Moos 10/22/12  . MASTECTOMY  1980   left side  . TONSILLECTOMY    . TOTAL KNEE ARTHROPLASTY  2010   left knee    Allergies  Allergen Reactions  . Cortisone  Medication List       Accurate as of 12/05/15  4:43 PM. Always use your most recent med list.          acetaminophen 325 MG tablet Commonly known as:  TYLENOL Take 650 mg by mouth every 4 (four) hours as needed for moderate pain.   antiseptic oral rinse Liqd 15 mLs by Mouth Rinse route as needed for dry mouth.   aspirin 81 MG chewable tablet Chew 81 mg by mouth every morning.   carvedilol 12.5 MG tablet Commonly known as:  COREG Take 12.5 mg  by mouth 2 (two) times daily with a meal.   cloNIDine 0.1 MG tablet Commonly known as:  CATAPRES Take 0.1 mg by mouth every morning.   diltiazem 120 MG 24 hr capsule Commonly known as:  CARDIZEM CD Take 120 mg by mouth daily.   DULoxetine 20 MG capsule Commonly known as:  CYMBALTA Take 20 mg by mouth daily.   famotidine 20 MG tablet Commonly known as:  PEPCID Take 20 mg by mouth daily as needed.   feeding supplement Liqd Take 1 Container by mouth daily as needed.   HYDROcodone-acetaminophen 5-325 MG tablet Commonly known as:  NORCO/VICODIN Take 1 tablet by mouth. Take one tablet every 6 hours as needed for pain   ipratropium-albuterol 0.5-2.5 (3) MG/3ML Soln Commonly known as:  DUONEB Take 3 mLs by nebulization every 6 (six) hours as needed. For shortness of breath and wheezing.   levothyroxine 50 MCG tablet Commonly known as:  SYNTHROID, LEVOTHROID Take 50 mcg by mouth every morning.   Lidocaine 4 % Ptch Commonly known as:  ASPERCREME LIDOCAINE Apply 1 patch topically daily. to help relieve back pain   losartan 50 MG tablet Commonly known as:  COZAAR Take 50 mg by mouth daily.   Melatonin 3 MG Tbdp Take 3 mg by mouth at bedtime.   multivitamin with minerals Tabs tablet Take 1 tablet by mouth every morning.   naproxen sodium 220 MG tablet Commonly known as:  ALEVE One up to twice daily to help arthritis   PEDIA-LAX 2.8 g Supp Generic drug:  Glycerin (Laxative) Place rectally as needed (for constipation).   polyethylene glycol packet Commonly known as:  MIRALAX / GLYCOLAX Take 17 g by mouth daily as needed.   polyvinyl alcohol 1.4 % ophthalmic solution Commonly known as:  LIQUIFILM TEARS Place 1 drop into both eyes 4 (four) times daily as needed.   tiotropium 18 MCG inhalation capsule Commonly known as:  SPIRIVA Place 18 mcg into inhaler and inhale daily.   VENTOLIN HFA 108 (90 Base) MCG/ACT inhaler Generic drug:  albuterol Inhale 2 puffs into the  lungs daily. Take 2 puffs Every 6 hours as needed for shortness of breath and wheezing       Review of Systems  Constitutional: Positive for fatigue. Negative for chills, diaphoresis and fever.       Generalized weakness.   HENT: Positive for hearing loss. Negative for congestion, ear discharge and ear pain.   Eyes: Negative for pain, discharge and redness.  Respiratory: Positive for cough and wheezing. Negative for shortness of breath and stridor.        Chronic hacking cough occasionally  Cardiovascular: Positive for leg swelling. Negative for chest pain and palpitations.       Trace in ankles.   Gastrointestinal: Negative for abdominal pain, constipation, diarrhea, nausea and vomiting.       Lower abd incisional hernia.    Genitourinary: Positive for  frequency. Negative for dysuria, flank pain, hematuria and urgency.       Q2-3 hours average.   Musculoskeletal: Positive for back pain. Negative for myalgias and neck pain.       Improved back pain. She is able to ambulates with walker on unit.  Resolution of Left hip pain since 11/25/14.  Skin: Negative for rash.       Lower abd incisional hernia-reducible.  Neurological: Positive for weakness. Negative for dizziness, tremors, seizures and headaches.  Psychiatric/Behavioral: Negative for hallucinations and suicidal ideas. The patient is not nervous/anxious.        C/o sleeps too much.    Immunization History  Administered Date(s) Administered  . Influenza,inj,Quad PF,36+ Mos 12/19/2013  . Influenza-Unspecified 01/21/2013, 12/28/2014  . PPD Test 01/02/2014  . Pneumococcal Polysaccharide-23 11/24/1999  . Td 12/28/1994  . Tdap 04/08/2013   Pertinent  Health Maintenance Due  Topic Date Due  . DEXA SCAN  05/24/1990  . PNA vac Low Risk Adult (2 of 2 - PCV13) 11/23/2000  . INFLUENZA VACCINE  10/22/2015   Fall Risk  12/03/2014 06/08/2014  Falls in the past year? Yes No  Number falls in past yr: 1 -  Injury with Fall? No -  Risk  for fall due to : History of fall(s);Impaired balance/gait;Impaired mobility Impaired balance/gait  Follow up Falls evaluation completed -   Functional Status Survey:    Vitals:   12/05/15 1359  BP: 132/66  Pulse: 65  Resp: 20  Temp: 97.6 F (36.4 C)  SpO2: 97%   There is no height or weight on file to calculate BMI. Physical Exam  Constitutional: She is oriented to person, place, and time. She appears well-developed and well-nourished. No distress.  HENT:  Head: Normocephalic and atraumatic.  Right Ear: External ear normal.  Left Ear: External ear normal.  Nose: Nose normal.  Mouth/Throat: Oropharynx is clear and moist. No oropharyngeal exudate.  Eyes: Conjunctivae and EOM are normal. Pupils are equal, round, and reactive to light. Right eye exhibits no discharge. Left eye exhibits no discharge. No scleral icterus.  Neck: Normal range of motion. Neck supple. No JVD present. No tracheal deviation present. No thyromegaly present.  Cardiovascular: Normal rate, regular rhythm, normal heart sounds and intact distal pulses.   No murmur heard. Pulmonary/Chest: Effort normal. No stridor. No respiratory distress. She has decreased breath sounds. She has no wheezes. She has rales. She exhibits no tenderness.  Bibasilar, posterior coarse expiratory rhonchi.   Abdominal: Soft. She exhibits no distension and no mass. There is no tenderness.  Musculoskeletal: Normal range of motion. She exhibits edema and tenderness.  Ankles and feet have trace edema. Mid back pain.  Unstable gait. Uses rolling walker.  Neurological: She is alert and oriented to person, place, and time. No cranial nerve deficit. She exhibits normal muscle tone. Coordination normal.  Left hemiparesis  Skin: Skin is warm and dry. No rash noted. She is not diaphoretic. No erythema. No pallor.  Mid abd surgical scar-healed-lower abd reducible incisional hernia-the patient has concerns of cosmetic reason but denied pain.  Left  mastectomy Scalp skin lesion above the left ear.   Psychiatric: She has a normal mood and affect. Her behavior is normal. Judgment and thought content normal.  Repetitive.     Labs reviewed:  Recent Labs  03/29/15 05/07/15 10/17/15  NA 140 136* 138  K 4.3 4.3 4.8  BUN 25* 25* 88*  CREATININE 0.6 0.7 0.3*    Recent Labs  03/29/15 05/07/15  10/17/15  AST 19 18 18   ALT 16 14 10   ALKPHOS 80 108 104    Recent Labs  03/29/15 05/07/15 10/17/15  WBC 6.0 6.7 5.6  HGB 15.4 15.2 17.3*  HCT 45 46 52*  PLT 379 352 347   Lab Results  Component Value Date   TSH 1.79 10/17/2015   No results found for: HGBA1C Lab Results  Component Value Date   CHOL 121 11/22/2012   HDL 40 11/22/2012   LDLCALC 65 11/22/2012   TRIG 81 11/22/2012    Significant Diagnostic Results in last 30 days:  No results found.  Assessment/Plan There are no diagnoses linked to this encounter.HTN (hypertension) Controlled, continue Clonidine 0.38mf, Carvedilol 12.5mg  bid, Diltiazem 120mg , Losartan 50mg  daily, monitor Bp    COPD (chronic obstructive pulmonary disease) CXR 12/14/14 ruled out PNA, last treated with  7 day course of Augmentin 875mg  q12h Continue Spiriva daily, prn Ventolin 2 puffs q6h 3 weeks ago. Treated Congestive cough, yellow phlegm, malaise 2-3 days, denied chest pain, no O2 desaturation, afebrile, will obtain CXR, CBC, CMP, Augmentin 875mg  q12hr x7 days stable. Chronic hacking cough occasionally. Diffused coarse expiratory wheezes noted, adding Medrol dose pk Spiriva, Duoneb, Albuterol for maintenance.  12/05/15 c/o chronic cough, fatigue, afebrile, no O2 desaturation. Will obtain CXR, adding Prednisone 5mg  daily.   GERD (gastroesophageal reflux disease) Stable, continue Famotidine 20mg  daily.    Constipation Controlled. Continue MiraLax bid   Hypothyroidism Continue Levothyroxine 48mcg daily, TSH 1.79 10/17/15    Depression mood is stabilized, switched from Zoloft to Cymbalta  in setting of back pain, reduced Cymbalta 20mg  daily due to c/o feeling shaky-improved. Observe.  Lower back pain Pain is better controlled, continue Norco prn, Cymbalta 20mg    Edema Minimal, mostly ankles/feet   Fatigue Chronic, 10/17/15 Hgb 17.3, Na 138, K 4.8, Bun 22, creat 0.34, TSH 1.79     Family/ staff Communication: continue SNF for care needs.   Labs/tests ordered:  CXR

## 2015-12-05 NOTE — Assessment & Plan Note (Signed)
CXR 12/14/14 ruled out PNA, last treated with  7 day course of Augmentin 875mg  q12h Continue Spiriva daily, prn Ventolin 2 puffs q6h 3 weeks ago. Treated Congestive cough, yellow phlegm, malaise 2-3 days, denied chest pain, no O2 desaturation, afebrile, will obtain CXR, CBC, CMP, Augmentin 875mg  q12hr x7 days stable. Chronic hacking cough occasionally. Diffused coarse expiratory wheezes noted, adding Medrol dose pk Spiriva, Duoneb, Albuterol for maintenance.  12/05/15 c/o chronic cough, fatigue, afebrile, no O2 desaturation. Will obtain CXR, adding Prednisone 5mg  daily.

## 2015-12-06 DIAGNOSIS — R05 Cough: Secondary | ICD-10-CM | POA: Diagnosis not present

## 2015-12-10 ENCOUNTER — Encounter: Payer: Self-pay | Admitting: Nurse Practitioner

## 2015-12-10 DIAGNOSIS — J189 Pneumonia, unspecified organism: Secondary | ICD-10-CM | POA: Insufficient documentation

## 2015-12-11 DIAGNOSIS — R2681 Unsteadiness on feet: Secondary | ICD-10-CM | POA: Diagnosis not present

## 2015-12-11 DIAGNOSIS — E039 Hypothyroidism, unspecified: Secondary | ICD-10-CM | POA: Diagnosis not present

## 2015-12-11 DIAGNOSIS — M25512 Pain in left shoulder: Secondary | ICD-10-CM | POA: Diagnosis not present

## 2015-12-11 DIAGNOSIS — Z9181 History of falling: Secondary | ICD-10-CM | POA: Diagnosis not present

## 2015-12-11 DIAGNOSIS — M6281 Muscle weakness (generalized): Secondary | ICD-10-CM | POA: Diagnosis not present

## 2015-12-11 DIAGNOSIS — R2689 Other abnormalities of gait and mobility: Secondary | ICD-10-CM | POA: Diagnosis not present

## 2015-12-12 DIAGNOSIS — R2689 Other abnormalities of gait and mobility: Secondary | ICD-10-CM | POA: Diagnosis not present

## 2015-12-12 DIAGNOSIS — Z9181 History of falling: Secondary | ICD-10-CM | POA: Diagnosis not present

## 2015-12-12 DIAGNOSIS — M25512 Pain in left shoulder: Secondary | ICD-10-CM | POA: Diagnosis not present

## 2015-12-12 DIAGNOSIS — M6281 Muscle weakness (generalized): Secondary | ICD-10-CM | POA: Diagnosis not present

## 2015-12-12 DIAGNOSIS — E039 Hypothyroidism, unspecified: Secondary | ICD-10-CM | POA: Diagnosis not present

## 2015-12-12 DIAGNOSIS — R2681 Unsteadiness on feet: Secondary | ICD-10-CM | POA: Diagnosis not present

## 2015-12-13 DIAGNOSIS — R2681 Unsteadiness on feet: Secondary | ICD-10-CM | POA: Diagnosis not present

## 2015-12-13 DIAGNOSIS — M25512 Pain in left shoulder: Secondary | ICD-10-CM | POA: Diagnosis not present

## 2015-12-13 DIAGNOSIS — R2689 Other abnormalities of gait and mobility: Secondary | ICD-10-CM | POA: Diagnosis not present

## 2015-12-13 DIAGNOSIS — Z9181 History of falling: Secondary | ICD-10-CM | POA: Diagnosis not present

## 2015-12-13 DIAGNOSIS — E039 Hypothyroidism, unspecified: Secondary | ICD-10-CM | POA: Diagnosis not present

## 2015-12-13 DIAGNOSIS — M6281 Muscle weakness (generalized): Secondary | ICD-10-CM | POA: Diagnosis not present

## 2016-01-07 ENCOUNTER — Encounter: Payer: Self-pay | Admitting: Nurse Practitioner

## 2016-01-07 ENCOUNTER — Non-Acute Institutional Stay (SKILLED_NURSING_FACILITY): Payer: Medicare Other | Admitting: Nurse Practitioner

## 2016-01-07 DIAGNOSIS — R195 Other fecal abnormalities: Secondary | ICD-10-CM

## 2016-01-07 DIAGNOSIS — E063 Autoimmune thyroiditis: Secondary | ICD-10-CM | POA: Diagnosis not present

## 2016-01-07 DIAGNOSIS — F320 Major depressive disorder, single episode, mild: Secondary | ICD-10-CM

## 2016-01-07 DIAGNOSIS — K5900 Constipation, unspecified: Secondary | ICD-10-CM

## 2016-01-07 DIAGNOSIS — F5101 Primary insomnia: Secondary | ICD-10-CM | POA: Diagnosis not present

## 2016-01-07 DIAGNOSIS — K219 Gastro-esophageal reflux disease without esophagitis: Secondary | ICD-10-CM | POA: Diagnosis not present

## 2016-01-07 DIAGNOSIS — R609 Edema, unspecified: Secondary | ICD-10-CM

## 2016-01-07 DIAGNOSIS — J410 Simple chronic bronchitis: Secondary | ICD-10-CM | POA: Diagnosis not present

## 2016-01-07 DIAGNOSIS — M5440 Lumbago with sciatica, unspecified side: Secondary | ICD-10-CM | POA: Diagnosis not present

## 2016-01-07 DIAGNOSIS — E038 Other specified hypothyroidism: Secondary | ICD-10-CM | POA: Diagnosis not present

## 2016-01-07 DIAGNOSIS — I10 Essential (primary) hypertension: Secondary | ICD-10-CM | POA: Diagnosis not present

## 2016-01-07 NOTE — Assessment & Plan Note (Signed)
Pain is better controlled, continue Norco prn, Cymbalta 20mg 

## 2016-01-07 NOTE — Assessment & Plan Note (Signed)
mood is stabilized, switched from Zoloft to Cymbalta in setting of back pain, reduced Cymbalta 20mg  daily due to c/o feeling shaky-improved. Observe.

## 2016-01-07 NOTE — Assessment & Plan Note (Signed)
Minimal, mostly ankles/feet 

## 2016-01-07 NOTE — Progress Notes (Deleted)
Location:  [facility] Room Number: 10 Place of Service:  SNF (31) Provider:  Mast, Manxie  NP  Jeanmarie Hubert, MD  Patient Care Team: Estill Dooms, MD as PCP - General (Internal Medicine) Gaynelle Arabian, MD as Consulting Physician (Orthopedic Surgery) Man Otho Darner, NP as Nurse Practitioner (Nurse Practitioner) [place] (Skilled Nursing and [place])  Extended Emergency Contact Information Primary Emergency Contact: Johnston,Betsy Address: [place], [place] Phone: [phone] Relation: Daughter Secondary Emergency Contact: Rey,Lindsey  [place] Phone: [phone] Relation: Daughter  Code Status:  DNR Goals of care: Advanced Directive information Advanced Directives 01/07/2016  Does patient have an advance directive? Yes  Type of Paramedic of [employer];Living will;Out of facility DNR (pink MOST or yellow form)  Does patient want to make changes to advanced directive? No - Patient declined  Copy of advanced directive(s) in chart? No - copy requested  Pre-existing out of facility DNR order (yellow form or pink MOST form) -     Chief Complaint  Patient presents with  . Acute Visit    lab positive for blood in stool.    HPI:  Pt is a 82 y.o. female seen today for medical management of chronic diseases.     Past Medical History:  Diagnosis Date  . Back pain, thoracic 01/12/2014  . Cancer ([place]) 1980   Breast cancer  . Constipation 11/18/2012  . COPD (chronic obstructive pulmonary disease) ([place])   . CVA (cerebral infarction) 06/08/2011   Following abdominal surgery for bowel obstruction   . Depression 12/26/2013  . DVT (deep venous thrombosis) ([place]) 11/18/2012  . Fall 02/07/2013  . GERD (gastroesophageal reflux disease) 03/30/2014  . Granuloma annulare 06/08/2014  . HTN (hypertension) 11/18/2012   01/15/14 Bun/creat 14/0.60   .  Hypertension   . Hypothyroidism   . Incisional hernia, without obstruction or gangrene 05/27/2014   Lower abd from previous surgery-2-3 small incisional hernias which are reducible-no incarceration or pain complained.    . Insomnia 11/18/2012  . Left hemiparesis ([place]) 06/08/2014  . Osteoporosis   . Shortness of breath   . Thoracic spine fracture [facility]) 12/18/2013   01/05/14 T 10 Dr. Newman Pies: PCP managing pain.  F/u 2 months(suggested that if the pain is "not too bad" and seems to be improving that she "live with it" and give a change to heal without intervention.  12/18/13 X-ray Thoracic spine: Compression deformity at the level of T10. Comparing back to a chest x-ray from 12/09/2013, this appears new in the interval.       . TIA (transient ischemic attack)   . Ventral hernia 06/08/2014   Past Surgical History:  Procedure Laterality Date  . ABDOMINAL HYSTERECTOMY    . CATARACT EXTRACTION, BILATERAL    . EXPLORATORY LAPAROTOMY W/ BOWEL RESECTION     10/19/12-11/01/12 [facility] for exploratory laparotomty with extensive lysis of adhesion and segmental small bowel resection with end to end anastomosis per Dr. Isidore Moos 10/22/12  . MASTECTOMY  1980   left side  . TONSILLECTOMY    . TOTAL KNEE ARTHROPLASTY  2010   left knee    Allergies  Allergen Reactions  . Cortisone       Medication List       Accurate as of 01/07/16  2:52 PM. Always use your most recent med list.  acetaminophen 325 MG tablet Commonly known as:  TYLENOL Take 650 mg by mouth every 4 (four) hours as needed for moderate pain.   antiseptic oral rinse Liqd 15 mLs by Mouth Rinse route as needed for dry mouth.   aspirin 81 MG chewable tablet Chew 81 mg by mouth every morning.   carvedilol 12.5 MG tablet Commonly known as:  COREG Take 12.5 mg by mouth 2 (two) times daily with a meal.   cloNIDine 0.1 MG tablet Commonly known as:  CATAPRES Take 0.1 mg by mouth every morning.   diltiazem 120 MG 24  hr capsule Commonly known as:  CARDIZEM CD Take 120 mg by mouth daily.   DULoxetine 20 MG capsule Commonly known as:  CYMBALTA Take 20 mg by mouth daily.   famotidine 20 MG tablet Commonly known as:  PEPCID Take 20 mg by mouth daily as needed.   feeding supplement Liqd Take 1 Container by mouth daily as needed.   ipratropium-albuterol 0.5-2.5 (3) MG/3ML Soln Commonly known as:  DUONEB Take 3 mLs by nebulization every 6 (six) hours as needed. For shortness of breath and wheezing.   levothyroxine 50 MCG tablet Commonly known as:  SYNTHROID, LEVOTHROID Take 50 mcg by mouth every morning.   Lidocaine 4 % Ptch Commonly known as:  ASPERCREME LIDOCAINE Apply 1 patch topically daily. to help relieve back pain   losartan 50 MG tablet Commonly known as:  COZAAR Take 50 mg by mouth daily.   Melatonin 3 MG Tbdp Take 3 mg by mouth at bedtime.   multivitamin with minerals Tabs tablet Take 1 tablet by mouth every morning.   naproxen sodium 220 MG tablet Commonly known as:  ALEVE One up to twice daily to help arthritis   PEDIA-LAX 2.8 g Supp Generic drug:  Glycerin (Laxative) Place rectally as needed (for constipation).   polyethylene glycol packet Commonly known as:  MIRALAX / GLYCOLAX Take 17 g by mouth daily as needed.   polyvinyl alcohol 1.4 % ophthalmic solution Commonly known as:  LIQUIFILM TEARS Place 1 drop into both eyes 4 (four) times daily as needed.   predniSONE 5 MG tablet Commonly known as:  DELTASONE Take 5 mg by mouth daily with breakfast.   tiotropium 18 MCG inhalation capsule Commonly known as:  SPIRIVA Place 18 mcg into inhaler and inhale daily.   TUSSIN DM 10-100 MG/5ML liquid Generic drug:  Dextromethorphan-Guaifenesin Take 5 mLs by mouth every 12 (twelve) hours.   VENTOLIN HFA 108 (90 Base) MCG/ACT inhaler Generic drug:  albuterol Inhale 2 puffs into the lungs daily. Take 2 puffs Every 6 hours as needed for shortness of breath and wheezing        Review of Systems  Immunization History  Administered Date(s) Administered  . Influenza,inj,Quad PF,36+ Mos 12/19/2013  . Influenza-Unspecified 01/21/2013, 12/28/2014  . PPD Test 01/02/2014  . Pneumococcal Polysaccharide-23 11/24/1999  . Td 12/28/1994  . Tdap 04/08/2013   Pertinent  Health Maintenance Due  Topic Date Due  . DEXA SCAN  05/24/1990  . PNA vac Low Risk Adult (2 of 2 - PCV13) 11/23/2000  . INFLUENZA VACCINE  10/22/2015   Fall Risk  12/03/2014 06/08/2014  Falls in the past year? Yes No  Number falls in past yr: 1 -  Injury with Fall? No -  Risk for fall due to : History of fall(s);Impaired balance/gait;Impaired mobility Impaired balance/gait  Follow up Falls evaluation completed -   Functional Status Survey:    Vitals:   01/07/16 1432  BP: 128/70  Pulse: 70  Resp: 18  Temp: (!) 96.3 F (35.7 C)  SpO2: 98%  Weight: 119 lb (54 kg)  Height: 5\' 1"  (1.549 m)   Body mass index is 22.48 kg/m. Physical Exam  Labs reviewed:  Recent Labs  03/29/15 05/07/15 10/17/15  NA 140 136* 138  K 4.3 4.3 4.8  BUN 25* 25* 88*  CREATININE 0.6 0.7 0.3*    Recent Labs  03/29/15 05/07/15 10/17/15  AST 19 18 18   ALT 16 14 10   ALKPHOS 80 108 104    Recent Labs  03/29/15 05/07/15 10/17/15  WBC 6.0 6.7 5.6  HGB 15.4 15.2 17.3*  HCT 45 46 52*  PLT 379 352 347   Lab Results  Component Value Date   TSH 1.79 10/17/2015   No results found for: HGBA1C Lab Results  Component Value Date   CHOL 121 11/22/2012   HDL 40 11/22/2012   LDLCALC 65 11/22/2012   TRIG 81 11/22/2012    Significant Diagnostic Results in last 30 days:  No results found.  Assessment/Plan There are no diagnoses linked to this encounter.   Family/ staff Communication:   Labs/tests ordered:

## 2016-01-07 NOTE — Assessment & Plan Note (Signed)
Stable. Continue Spiriva, Duonebprn, Albuterol for maintenance, Prednisone 5mg  daily.

## 2016-01-07 NOTE — Assessment & Plan Note (Signed)
Dc Aleve, adding Omeprazole 20mg  daily, update CBC and CMP

## 2016-01-07 NOTE — Assessment & Plan Note (Signed)
12/31/15 pharm dc prn Famotidine.  Adding Omeprazole 20mg  daily.

## 2016-01-07 NOTE — Assessment & Plan Note (Signed)
Sleeps well.  

## 2016-01-07 NOTE — Assessment & Plan Note (Signed)
Continue Levothyroxine 60mcg daily, TSH 1.79 10/17/15

## 2016-01-07 NOTE — Assessment & Plan Note (Signed)
Controlled, continue Clonidine 0.1mg , Carvedilol 12.5mg  bid, Diltiazem 120mg , Losartan 50mg  daily, monitor Bp

## 2016-01-07 NOTE — Assessment & Plan Note (Signed)
Controlled. Continue MiraLax bid 

## 2016-01-07 NOTE — Progress Notes (Signed)
Location:  [place] Room Number: 10 Place of Service:  SNF (31) Provider:  Aleksey Newbern, Manxie  NP  Jeanmarie Hubert, MD  Patient Care Team: Estill Dooms, MD as PCP - General (Internal Medicine) Gaynelle Arabian, MD as Consulting Physician (Orthopedic Surgery) Dorman Calderwood Otho Darner, NP as Nurse Practitioner (Nurse Practitioner) [place] (Skilled Nursing and [place])  Extended Emergency Contact Information Primary Emergency Contact: Johnston,Betsy Address: [place], [place] Phone: [phone] Relation: Daughter Secondary Emergency Contact: Rey,Lindsey  [place] Phone: [phone] Relation: Daughter  Code Status:  DNR Goals of care: Advanced Directive information Advanced Directives 01/07/2016  Does patient have an advance directive? Yes  Type of Paramedic of [place];Living will;Out of facility DNR (pink MOST or yellow form)  Does patient want to make changes to advanced directive? No - Patient declined  Copy of advanced directive(s) in chart? No - copy requested  Pre-existing out of facility DNR order (yellow form or pink MOST form) -     Chief Complaint  Patient presents with  . Acute Visit    lab positive for blood in stool.    HPI:  Pt is a 82 y.o. female seen today for an acute visit for hem positive stool, heart burn at night, has been Aleve for 2 months for arthritic pain. off Famotidine 20mg  daily since 12/31/15 per pharm recommendation.    Hx of COPD, chronic cough, on Spiriva daily, Ventolin daily,  DuoNeb prn, Prednisone 5mg  qd, Hx of depression and back pain, better managed with Cymbalta 20mg  daily. Her blood pressure is well controlled, takes Losartan 50mg  and Diltiazem 120mg , Carvedilol 12.5mg  bid,  Hypothyroidism, taking Levothyroxine 10mcg, TSH 1.79 10/17/15  Past Medical History:  Diagnosis Date  . Back pain, thoracic  01/12/2014  . Cancer ([place]) 1980   Breast cancer  . Constipation 11/18/2012  . COPD (chronic obstructive pulmonary disease) ([place])   . CVA (cerebral infarction) 06/08/2011   Following abdominal surgery for bowel obstruction   . Depression 12/26/2013  . DVT (deep venous thrombosis) ([place]) 11/18/2012  . Fall 02/07/2013  . GERD (gastroesophageal reflux disease) 03/30/2014  . Granuloma annulare 06/08/2014  . HTN (hypertension) 11/18/2012   01/15/14 Bun/creat 14/0.60   . Hypertension   . Hypothyroidism   . Incisional hernia, without obstruction or gangrene 05/27/2014   Lower abd from previous surgery-2-3 small incisional hernias which are reducible-no incarceration or pain complained.    . Insomnia 11/18/2012  . Left hemiparesis ([place]) 06/08/2014  . Osteoporosis   . Shortness of breath   . Thoracic spine fracture [facility]) 12/18/2013   01/05/14 T 10 Dr. Newman Pies: PCP managing pain.  F/u 2 months(suggested that if the pain is "not too bad" and seems to be improving that she "live with it" and give a change to heal without intervention.  12/18/13 X-ray Thoracic spine: Compression deformity at the level of T10. Comparing back to a chest x-ray from 12/09/2013, this appears new in the interval.       . TIA (transient ischemic attack)   . Ventral hernia 06/08/2014   Past Surgical History:  Procedure Laterality Date  . ABDOMINAL HYSTERECTOMY    . CATARACT EXTRACTION, BILATERAL    . EXPLORATORY LAPAROTOMY W/ BOWEL RESECTION     10/19/12-11/01/12 [facility] for exploratory laparotomty with extensive lysis of adhesion and segmental small bowel resection  with end to end anastomosis per Dr. Isidore Moos 10/22/12  . MASTECTOMY  1980   left side  . TONSILLECTOMY    . TOTAL KNEE ARTHROPLASTY  2010   left knee    Allergies  Allergen Reactions  . Cortisone       Medication List       Accurate as of 01/07/16  3:41 PM. Always use your most recent med list.          acetaminophen 325 MG tablet Commonly known  as:  TYLENOL Take 650 mg by mouth every 4 (four) hours as needed for moderate pain.   antiseptic oral rinse Liqd 15 mLs by Mouth Rinse route as needed for dry mouth.   aspirin 81 MG chewable tablet Chew 81 mg by mouth every morning.   carvedilol 12.5 MG tablet Commonly known as:  COREG Take 12.5 mg by mouth 2 (two) times daily with a meal.   cloNIDine 0.1 MG tablet Commonly known as:  CATAPRES Take 0.1 mg by mouth every morning.   diltiazem 120 MG 24 hr capsule Commonly known as:  CARDIZEM CD Take 120 mg by mouth daily.   DULoxetine 20 MG capsule Commonly known as:  CYMBALTA Take 20 mg by mouth daily.   famotidine 20 MG tablet Commonly known as:  PEPCID Take 20 mg by mouth daily as needed.   feeding supplement Liqd Take 1 Container by mouth daily as needed.   ipratropium-albuterol 0.5-2.5 (3) MG/3ML Soln Commonly known as:  DUONEB Take 3 mLs by nebulization every 6 (six) hours as needed. For shortness of breath and wheezing.   levothyroxine 50 MCG tablet Commonly known as:  SYNTHROID, LEVOTHROID Take 50 mcg by mouth every morning.   Lidocaine 4 % Ptch Commonly known as:  ASPERCREME LIDOCAINE Apply 1 patch topically daily. to help relieve back pain   losartan 50 MG tablet Commonly known as:  COZAAR Take 50 mg by mouth daily.   Melatonin 3 MG Tbdp Take 3 mg by mouth at bedtime.   multivitamin with minerals Tabs tablet Take 1 tablet by mouth every morning.   naproxen sodium 220 MG tablet Commonly known as:  ALEVE One up to twice daily to help arthritis   PEDIA-LAX 2.8 g Supp Generic drug:  Glycerin (Laxative) Place rectally as needed (for constipation).   polyethylene glycol packet Commonly known as:  MIRALAX / GLYCOLAX Take 17 g by mouth daily as needed.   polyvinyl alcohol 1.4 % ophthalmic solution Commonly known as:  LIQUIFILM TEARS Place 1 drop into both eyes 4 (four) times daily as needed.   predniSONE 5 MG tablet Commonly known as:   DELTASONE Take 5 mg by mouth daily with breakfast.   tiotropium 18 MCG inhalation capsule Commonly known as:  SPIRIVA Place 18 mcg into inhaler and inhale daily.   TUSSIN DM 10-100 MG/5ML liquid Generic drug:  Dextromethorphan-Guaifenesin Take 5 mLs by mouth every 12 (twelve) hours.   VENTOLIN HFA 108 (90 Base) MCG/ACT inhaler Generic drug:  albuterol Inhale 2 puffs into the lungs daily. Take 2 puffs Every 6 hours as needed for shortness of breath and wheezing       Review of Systems  Constitutional: Positive for fatigue. Negative for chills, diaphoresis and fever.       Generalized weakness.   HENT: Positive for hearing loss. Negative for congestion, ear discharge and ear pain.   Eyes: Negative for pain, discharge and redness.  Respiratory: Positive for cough. Negative for shortness of breath, wheezing  and stridor.        Chronic hacking cough occasionally  Cardiovascular: Positive for leg swelling. Negative for chest pain and palpitations.       Trace in ankles.   Gastrointestinal: Positive for blood in stool. Negative for abdominal pain, constipation, diarrhea, nausea and vomiting.       Lower abd incisional hernia. C/o heartburn    Genitourinary: Positive for frequency. Negative for dysuria, flank pain, hematuria and urgency.       Q2-3 hours average.   Musculoskeletal: Positive for back pain. Negative for myalgias and neck pain.       Improved back pain. She is able to ambulates with walker on unit.  Resolution of Left hip pain since 11/25/14.  Skin: Negative for rash.       Lower abd incisional hernia-reducible.  Neurological: Positive for weakness. Negative for dizziness, tremors, seizures and headaches.  Psychiatric/Behavioral: Negative for hallucinations and suicidal ideas. The patient is not nervous/anxious.        C/o sleeps too much.    Immunization History  Administered Date(s) Administered  . Influenza,inj,Quad PF,36+ Mos 12/19/2013  . Influenza-Unspecified  01/21/2013, 12/28/2014  . PPD Test 01/02/2014  . Pneumococcal Polysaccharide-23 11/24/1999  . Td 12/28/1994  . Tdap 04/08/2013   Pertinent  Health Maintenance Due  Topic Date Due  . DEXA SCAN  05/24/1990  . PNA vac Low Risk Adult (2 of 2 - PCV13) 11/23/2000  . INFLUENZA VACCINE  10/22/2015   Fall Risk  12/03/2014 06/08/2014  Falls in the past year? Yes No  Number falls in past yr: 1 -  Injury with Fall? No -  Risk for fall due to : History of fall(s);Impaired balance/gait;Impaired mobility Impaired balance/gait  Follow up Falls evaluation completed -   Functional Status Survey:    Vitals:   01/07/16 1432  BP: 128/70  Pulse: 70  Resp: 18  Temp: (!) 96.3 F (35.7 C)  SpO2: 98%  Weight: 119 lb (54 kg)  Height: 5\' 1"  (1.549 m)   Body mass index is 22.48 kg/m. Physical Exam  Constitutional: She is oriented to person, place, and time. She appears well-developed and well-nourished. No distress.  HENT:  Head: Normocephalic and atraumatic.  Right Ear: External ear normal.  Left Ear: External ear normal.  Nose: Nose normal.  Mouth/Throat: Oropharynx is clear and moist. No oropharyngeal exudate.  Eyes: Conjunctivae and EOM are normal. Pupils are equal, round, and reactive to light. Right eye exhibits no discharge. Left eye exhibits no discharge. No scleral icterus.  Neck: Normal range of motion. Neck supple. No JVD present. No tracheal deviation present. No thyromegaly present.  Cardiovascular: Normal rate, regular rhythm, normal heart sounds and intact distal pulses.   No murmur heard. Pulmonary/Chest: Effort normal. No stridor. No respiratory distress. She has decreased breath sounds. She has no wheezes. She has rales. She exhibits no tenderness.  Bibasilar, posterior coarse expiratory rhonchi.   Abdominal: Soft. She exhibits no distension and no mass. There is no tenderness.  Genitourinary: Rectal exam shows guaiac positive stool.  Musculoskeletal: Normal range of motion. She  exhibits edema and tenderness.  Ankles and feet have trace edema. Mid back pain.  Unstable gait. Uses rolling walker.  Neurological: She is alert and oriented to person, place, and time. No cranial nerve deficit. She exhibits normal muscle tone. Coordination normal.  Left hemiparesis  Skin: Skin is warm and dry. No rash noted. She is not diaphoretic. No erythema. No pallor.  Mid abd surgical scar-healed-lower  abd reducible incisional hernia-the patient has concerns of cosmetic reason but denied pain.  Left mastectomy Scalp skin lesion above the left ear.   Psychiatric: She has a normal mood and affect. Her behavior is normal. Judgment and thought content normal.  Repetitive.     Labs reviewed:  Recent Labs  03/29/15 05/07/15 10/17/15  NA 140 136* 138  K 4.3 4.3 4.8  BUN 25* 25* 88*  CREATININE 0.6 0.7 0.3*    Recent Labs  03/29/15 05/07/15 10/17/15  AST 19 18 18   ALT 16 14 10   ALKPHOS 80 108 104    Recent Labs  03/29/15 05/07/15 10/17/15  WBC 6.0 6.7 5.6  HGB 15.4 15.2 17.3*  HCT 45 46 52*  PLT 379 352 347   Lab Results  Component Value Date   TSH 1.79 10/17/2015   No results found for: HGBA1C Lab Results  Component Value Date   CHOL 121 11/22/2012   HDL 40 11/22/2012   LDLCALC 65 11/22/2012   TRIG 81 11/22/2012    Significant Diagnostic Results in last 30 days:  No results found.  Assessment/Plan Hematest positive stools Dc Aleve, adding Omeprazole 20mg  daily, update CBC and CMP  HTN (hypertension) Controlled, continue Clonidine 0.1mg , Carvedilol 12.5mg  bid, Diltiazem 120mg , Losartan 50mg  daily, monitor Bp    COPD (chronic obstructive pulmonary disease) (HCC) Stable. Continue Spiriva, Duonebprn, Albuterol for maintenance, Prednisone 5mg  daily.   GERD (gastroesophageal reflux disease) 12/31/15 pharm dc prn Famotidine.  Adding Omeprazole 20mg  daily.   Constipation Controlled. Continue MiraLax bid   Hypothyroidism Continue Levothyroxine 58mcg  daily, TSH 1.79 10/17/15    Depression mood is stabilized, switched from Zoloft to Cymbalta in setting of back pain, reduced Cymbalta 20mg  daily due to c/o feeling shaky-improved. Observe.  Lower back pain Pain is better controlled, continue Norco prn, Cymbalta 20mg    Insomnia Sleeps well.   Edema Minimal, mostly ankles/feet      Family/ staff Communication: continue SNF for care assistance.   Labs/tests ordered:  CBC, CMP

## 2016-01-08 DIAGNOSIS — I10 Essential (primary) hypertension: Secondary | ICD-10-CM | POA: Diagnosis not present

## 2016-01-08 DIAGNOSIS — D649 Anemia, unspecified: Secondary | ICD-10-CM | POA: Diagnosis not present

## 2016-01-08 LAB — CBC AND DIFFERENTIAL
HCT: 45 % (ref 36–46)
HEMOGLOBIN: 14.7 g/dL (ref 12.0–16.0)
Platelets: 428 10*3/uL — AB (ref 150–399)
WBC: 7.9 10*3/mL

## 2016-01-08 LAB — BASIC METABOLIC PANEL
BUN: 29 mg/dL — AB (ref 4–21)
Creatinine: 0.6 mg/dL (ref <=1.1)
GLUCOSE: 97 mg/dL
POTASSIUM: 4.5 mmol/L (ref 3.4–5.3)
SODIUM: 139 mmol/L (ref 137–147)

## 2016-01-08 LAB — HEPATIC FUNCTION PANEL
ALT: 10 U/L (ref 7–35)
AST: 16 U/L (ref 13–35)
Alkaline Phosphatase: 74 U/L (ref 25–125)
Bilirubin, Total: 0.6 mg/dL

## 2016-01-10 ENCOUNTER — Other Ambulatory Visit: Payer: Self-pay | Admitting: *Deleted

## 2016-01-23 ENCOUNTER — Non-Acute Institutional Stay (SKILLED_NURSING_FACILITY): Payer: Medicare Other | Admitting: Internal Medicine

## 2016-01-23 ENCOUNTER — Encounter: Payer: Self-pay | Admitting: Internal Medicine

## 2016-01-23 DIAGNOSIS — F411 Generalized anxiety disorder: Secondary | ICD-10-CM | POA: Diagnosis not present

## 2016-01-23 DIAGNOSIS — I10 Essential (primary) hypertension: Secondary | ICD-10-CM

## 2016-01-23 DIAGNOSIS — E063 Autoimmune thyroiditis: Secondary | ICD-10-CM

## 2016-01-23 DIAGNOSIS — F320 Major depressive disorder, single episode, mild: Secondary | ICD-10-CM

## 2016-01-23 DIAGNOSIS — M5440 Lumbago with sciatica, unspecified side: Secondary | ICD-10-CM | POA: Diagnosis not present

## 2016-01-23 DIAGNOSIS — E038 Other specified hypothyroidism: Secondary | ICD-10-CM

## 2016-01-23 NOTE — Progress Notes (Signed)
Progress Note    Location:  [place] Room Number: [place] of Service:  SNF [phone]) Provider: Jeanmarie Hubert, MD  Patient Care Team: Estill Dooms, MD as PCP - General (Internal Medicine) Gaynelle Arabian, MD as Consulting Physician (Orthopedic Surgery) Man Otho Darner, NP as Nurse Practitioner (Nurse Practitioner) [place] (Skilled Nursing and [place])  Extended Emergency Contact Information Primary Emergency Contact: Johnston,Betsy Address: [place], [place] Phone: [phone] Relation: Daughter Secondary Emergency Contact: Rey,Lindsey  [place] Phone: [phone] Relation: Daughter  Code Status:  DNR Goals of care: Advanced Directive information Advanced Directives 01/23/2016  Does patient have an advance directive? Yes  Type of Paramedic of [place];Living will;Out of facility DNR (pink MOST or yellow form)  Does patient want to make changes to advanced directive? -  Copy of advanced directive(s) in chart? Yes  Pre-existing out of facility DNR order (yellow form or pink MOST form) Yellow form placed in chart (order not valid for inpatient use);Pink MOST form placed in chart (order not valid for inpatient use)     Chief Complaint  Patient presents with  . Medical Management of Chronic Issues    routine visit    HPI:  Pt is a 82 y.o. female seen today for medical management of chronic diseases.    Mild single current episode of major depressive disorder [facility]) - recently visited with psych nurse. Suggested increase in Citalopram to 40 mg qd.  Essential hypertension - controlled  Hypothyroidism due to Hashimoto's thyroiditis - compensated  Low back pain with sciatica, sciatica laterality unspecified, unspecified back pain laterality, unspecified chronicity - chronic and unchanged. Worse with movement.     Past Medical  History:  Diagnosis Date  . Back pain, thoracic 01/12/2014  . Cancer ([place]) 1980   Breast cancer  . Constipation 11/18/2012  . COPD (chronic obstructive pulmonary disease) ([place])   . CVA (cerebral infarction) 06/08/2011   Following abdominal surgery for bowel obstruction   . Depression 12/26/2013  . DVT (deep venous thrombosis) ([place]) 11/18/2012  . Fall 02/07/2013  . GERD (gastroesophageal reflux disease) 03/30/2014  . Granuloma annulare 06/08/2014  . HTN (hypertension) 11/18/2012   01/15/14 Bun/creat 14/0.60   . Hypertension   . Hypothyroidism   . Incisional hernia, without obstruction or gangrene 05/27/2014   Lower abd from previous surgery-2-3 small incisional hernias which are reducible-no incarceration or pain complained.    . Insomnia 11/18/2012  . Left hemiparesis ([place]) 06/08/2014  . Osteoporosis   . Shortness of breath   . Thoracic spine fracture [facility]) 12/18/2013   01/05/14 T 10 Dr. Newman Pies: PCP managing pain.  F/u 2 months(suggested that if the pain is "not too bad" and seems to be improving that she "live with it" and give a change to heal without intervention.  12/18/13 X-ray Thoracic spine: Compression deformity at the level of T10. Comparing back to a chest x-ray from 12/09/2013, this appears new in the interval.       . TIA (transient ischemic attack)   . Ventral hernia 06/08/2014   Past Surgical History:  Procedure Laterality Date  . ABDOMINAL HYSTERECTOMY    . CATARACT EXTRACTION, BILATERAL    . EXPLORATORY LAPAROTOMY W/ BOWEL RESECTION     10/19/12-11/01/12 [facility] for exploratory laparotomty with extensive lysis of adhesion and segmental small bowel resection  with end to end anastomosis per Dr. Isidore Moos 10/22/12  . MASTECTOMY  1980   left side  . TONSILLECTOMY    . TOTAL KNEE ARTHROPLASTY  2010   left knee    Allergies  Allergen Reactions  . Cortisone       Medication List       Accurate as of 01/23/16 12:38 PM. Always use your most recent med list.           acetaminophen 325 MG tablet Commonly known as:  TYLENOL Take 650 mg by mouth every 4 (four) hours as needed for moderate pain.   antiseptic oral rinse Liqd 15 mLs by Mouth Rinse route as needed for dry mouth.   aspirin 81 MG chewable tablet Chew 81 mg by mouth every morning.   carvedilol 12.5 MG tablet Commonly known as:  COREG Take 12.5 mg by mouth 2 (two) times daily with a meal.   cloNIDine 0.1 MG tablet Commonly known as:  CATAPRES Take 0.1 mg by mouth every morning.   diltiazem 120 MG 24 hr capsule Commonly known as:  CARDIZEM CD Take 120 mg by mouth daily.   DULoxetine 20 MG capsule Commonly known as:  CYMBALTA Take 20 mg by mouth. Take one tablet twice daily   famotidine 20 MG tablet Commonly known as:  PEPCID Take 20 mg by mouth daily as needed.   feeding supplement Liqd Take 1 Container by mouth daily as needed.   ipratropium-albuterol 0.5-2.5 (3) MG/3ML Soln Commonly known as:  DUONEB Take 3 mLs by nebulization every 6 (six) hours as needed. For shortness of breath and wheezing.   levothyroxine 50 MCG tablet Commonly known as:  SYNTHROID, LEVOTHROID Take 50 mcg by mouth every morning.   Lidocaine 4 % Ptch Commonly known as:  ASPERCREME LIDOCAINE Apply 1 patch topically daily. to help relieve back pain   losartan 50 MG tablet Commonly known as:  COZAAR Take 50 mg by mouth daily.   Melatonin 3 MG Tbdp Take 3 mg by mouth at bedtime.   multivitamin with minerals Tabs tablet Take 1 tablet by mouth every morning.   omeprazole 20 MG capsule Commonly known as:  PRILOSEC Take 20 mg by mouth. Take one tablet daily   PEDIA-LAX 2.8 g Supp Generic drug:  Glycerin (Laxative) Place rectally as needed (for constipation).   polyethylene glycol packet Commonly known as:  MIRALAX / GLYCOLAX Take 17 g by mouth daily as needed.   polyvinyl alcohol 1.4 % ophthalmic solution Commonly known as:  LIQUIFILM TEARS Place 1 drop into both eyes 4 (four) times  daily as needed.   predniSONE 5 MG tablet Commonly known as:  DELTASONE Take 5 mg by mouth daily with breakfast.   tiotropium 18 MCG inhalation capsule Commonly known as:  SPIRIVA Place 18 mcg into inhaler and inhale daily.   TUSSIN DM 10-100 MG/5ML liquid Generic drug:  Dextromethorphan-Guaifenesin Take 5 mLs by mouth every 12 (twelve) hours.   VENTOLIN HFA 108 (90 Base) MCG/ACT inhaler Generic drug:  albuterol Inhale 2 puffs into the lungs daily. Take 2 puffs Every 6 hours as needed for shortness of breath and wheezing       Review of Systems  Constitutional: Positive for fatigue. Negative for chills, diaphoresis and fever.       Generalized weakness.   HENT: Positive for hearing loss. Negative for congestion, ear discharge and ear pain.   Eyes: Negative for pain, discharge and redness.  Respiratory: Positive for cough. Negative for shortness  of breath, wheezing and stridor.        Chronic hacking cough occasionally  Cardiovascular: Positive for leg swelling. Negative for chest pain and palpitations.       Trace in ankles.   Gastrointestinal: Positive for blood in stool (none recent; history of blood in stool)). Negative for abdominal pain, constipation, diarrhea, nausea and vomiting.       Lower abd incisional hernia. C/o heartburn   Genitourinary: Positive for frequency. Negative for dysuria, flank pain, hematuria and urgency.       Q2-3 hours average.   Musculoskeletal: Positive for back pain. Negative for myalgias and neck pain.       Improved back pain. She is able to ambulates with walker on unit.  Resolution of Left hip pain since 11/25/14.  Skin: Negative for rash.       Lower abd incisional hernia-reducible.  Neurological: Positive for weakness. Negative for dizziness, tremors, seizures and headaches.  Psychiatric/Behavioral: Negative for hallucinations and suicidal ideas. The patient is not nervous/anxious.        C/o sleeps too much.    Immunization History    Administered Date(s) Administered  . Influenza,inj,Quad PF,36+ Mos 12/19/2013  . Influenza-Unspecified 01/21/2013, 12/28/2014, 01/03/2016  . PPD Test 01/02/2014  . Pneumococcal Polysaccharide-23 11/24/1999  . Td 12/28/1994  . Tdap 04/08/2013   Pertinent  Health Maintenance Due  Topic Date Due  . DEXA SCAN  05/24/1990  . PNA vac Low Risk Adult (2 of 2 - PCV13) 11/23/2000  . INFLUENZA VACCINE  Completed   Fall Risk  12/03/2014 06/08/2014  Falls in the past year? Yes No  Number falls in past yr: 1 -  Injury with Fall? No -  Risk for fall due to : History of fall(s);Impaired balance/gait;Impaired mobility Impaired balance/gait  Follow up Falls evaluation completed -   Functional Status Survey:    Vitals:   01/23/16 1218  BP: 127/74  Pulse: 82  Resp: (!) 22  Temp: (!) 96.3 F (35.7 C)  SpO2: 98%  Weight: 121 lb (54.9 kg)  Height: 5\' 2"  (1.575 m)   Body mass index is 22.13 kg/m. Physical Exam  Labs reviewed:  Recent Labs  05/07/15 10/17/15 01/08/16  NA 136* 138 139  K 4.3 4.8 4.5  BUN 25* 88* 29*  CREATININE 0.7 0.3* 0.6    Recent Labs  05/07/15 10/17/15 01/08/16  AST 18 18 16   ALT 14 10 10   ALKPHOS 108 104 74    Recent Labs  05/07/15 10/17/15 01/08/16  WBC 6.7 5.6 7.9  HGB 15.2 17.3* 14.7  HCT 46 52* 45  PLT 352 347 428*   Lab Results  Component Value Date   TSH 1.79 10/17/2015   No results found for: HGBA1C Lab Results  Component Value Date   CHOL 121 11/22/2012   HDL 40 11/22/2012   LDLCALC 65 11/22/2012   TRIG 81 11/22/2012     1. Mild single current episode of major depressive disorder (HCC) Increase citalopram to 40 mg qd  2. Essential hypertension controlled  3. Hypothyroidism due to Hashimoto's thyroiditis compensated  4. Low back pain with sciatica, sciatica laterality unspecified, unspecified back pain laterality, unspecified chronicity Chronic and unmchanged

## 2016-01-27 MED ORDER — CITALOPRAM HYDROBROMIDE 40 MG PO TABS: ORAL_TABLET | ORAL | 5 refills | Status: DC

## 2016-01-30 DIAGNOSIS — M6281 Muscle weakness (generalized): Secondary | ICD-10-CM | POA: Diagnosis not present

## 2016-02-27 DIAGNOSIS — F411 Generalized anxiety disorder: Secondary | ICD-10-CM | POA: Diagnosis not present

## 2016-03-03 ENCOUNTER — Non-Acute Institutional Stay (SKILLED_NURSING_FACILITY): Payer: Medicare Other | Admitting: Nurse Practitioner

## 2016-03-03 ENCOUNTER — Encounter: Payer: Self-pay | Admitting: Nurse Practitioner

## 2016-03-03 DIAGNOSIS — M5440 Lumbago with sciatica, unspecified side: Secondary | ICD-10-CM | POA: Diagnosis not present

## 2016-03-03 DIAGNOSIS — K219 Gastro-esophageal reflux disease without esophagitis: Secondary | ICD-10-CM

## 2016-03-03 DIAGNOSIS — J410 Simple chronic bronchitis: Secondary | ICD-10-CM

## 2016-03-03 DIAGNOSIS — K5900 Constipation, unspecified: Secondary | ICD-10-CM

## 2016-03-03 DIAGNOSIS — R609 Edema, unspecified: Secondary | ICD-10-CM | POA: Diagnosis not present

## 2016-03-03 DIAGNOSIS — F320 Major depressive disorder, single episode, mild: Secondary | ICD-10-CM | POA: Diagnosis not present

## 2016-03-03 DIAGNOSIS — E039 Hypothyroidism, unspecified: Secondary | ICD-10-CM | POA: Diagnosis not present

## 2016-03-03 DIAGNOSIS — I10 Essential (primary) hypertension: Secondary | ICD-10-CM

## 2016-03-03 DIAGNOSIS — R195 Other fecal abnormalities: Secondary | ICD-10-CM | POA: Diagnosis not present

## 2016-03-03 NOTE — Assessment & Plan Note (Signed)
Minimal, mostly ankles/feet 

## 2016-03-03 NOTE — Assessment & Plan Note (Signed)
Pain is better controlled, continue Norco prn, Cymbalta 20mg 

## 2016-03-03 NOTE — Assessment & Plan Note (Signed)
mood is stabilized, switched from Zoloft to Cymbalta in setting of back pain, reduced Cymbalta 20mg  daily due to c/o feeling shaky-improved. Observe. Continue Cymbalta and Celexa

## 2016-03-03 NOTE — Assessment & Plan Note (Signed)
12/31/15 pharm dc prn Famotidine.  Continue Omeprazole 20mg  daily.

## 2016-03-03 NOTE — Assessment & Plan Note (Signed)
Controlled, continue Clonidine 0.1mg , Carvedilol 12.5mg  bid, Diltiazem 120mg , Losartan 50mg  daily, monitor Bp

## 2016-03-03 NOTE — Assessment & Plan Note (Signed)
Controlled. Continue MiraLax bid 

## 2016-03-03 NOTE — Assessment & Plan Note (Signed)
Stable. Continue Spiriva, Duonebprn, Albuterol for maintenance, Prednisone 5mg  daily.

## 2016-03-03 NOTE — Assessment & Plan Note (Signed)
10/19/17u Hgb 14.7

## 2016-03-03 NOTE — Assessment & Plan Note (Signed)
Continue Levothyroxine 55mcg daily, TSH 1.79 10/17/15

## 2016-03-03 NOTE — Progress Notes (Signed)
Location:  [name] Room Number: 10 Place of Service:  SNF (31) Provider:  Birttany Dechellis, Manxie NP  Dana Hubert, MD  Patient Care Team: Dana Dooms, MD as PCP - General (Internal Medicine) Dana Arabian, MD as Consulting Physician (Orthopedic Surgery) Savien Mamula Otho Darner, NP as Nurse Practitioner (Nurse Practitioner) [place] (Skilled Nursing and [place])  Extended Emergency Contact Information Primary Emergency Contact: Johnston,Betsy Address: [place], [place] Phone: [phone] Relation: Daughter Secondary Emergency Contact: Rey,Lindsey  [place] Phone: [phone] Relation: Daughter  Code Status:  DNR Goals of care: Advanced Directive information Advanced Directives 03/03/2016  Does Patient Have a Medical Advance Directive? Yes  Type of Paramedic of [place];Living will;Out of facility DNR (pink MOST or yellow form)  Does patient want to make changes to medical advance directive? No - Patient declined  Copy of [place] in Chart? Yes  Pre-existing out of facility DNR order (yellow form or pink MOST form) -     Chief Complaint  Patient presents with  . Medical Management of Chronic Issues    HPI:  Pt is a 82 y.o. female seen today for medical management of chronic diseases.    Hx of COPD, chronic cough, on Spiriva daily, Ventolin daily,  DuoNeb prn, Prednisone 5mg  qd, Hx of depression and back pain, better managed with Cymbalta 20mg  daily and Celexa.  Her blood pressure is well controlled, takes Losartan 50mg  and Diltiazem 120mg , Carvedilol 12.5mg  bid,  Hypothyroidism, taking Levothyroxine 97mcg, TSH 1.79 10/17/15  Past Medical History:  Diagnosis Date  . Back pain, thoracic 01/12/2014  . Cancer ([place]) 1980   Breast cancer  . Constipation 11/18/2012  . COPD (chronic obstructive pulmonary disease) ([place])   . CVA  (cerebral infarction) 06/08/2011   Following abdominal surgery for bowel obstruction   . Depression 12/26/2013  . DVT (deep venous thrombosis) ([place]) 11/18/2012  . Fall 02/07/2013  . GERD (gastroesophageal reflux disease) 03/30/2014  . Granuloma annulare 06/08/2014  . HTN (hypertension) 11/18/2012   01/15/14 Bun/creat 14/0.60   . Hypertension   . Hypothyroidism   . Incisional hernia, without obstruction or gangrene 05/27/2014   Lower abd from previous surgery-2-3 small incisional hernias which are reducible-no incarceration or pain complained.    . Insomnia 11/18/2012  . Left hemiparesis ([place]) 06/08/2014  . Osteoporosis   . Shortness of breath   . Thoracic spine fracture [facility]) 12/18/2013   01/05/14 T 10 Dr. Newman Pies: PCP managing pain.  F/u 2 months(suggested that if the pain is "not too bad" and seems to be improving that she "live with it" and give a change to heal without intervention.  12/18/13 X-ray Thoracic spine: Compression deformity at the level of T10. Comparing back to a chest x-ray from 12/09/2013, this appears new in the interval.       . TIA (transient ischemic attack)   . Ventral hernia 06/08/2014   Past Surgical History:  Procedure Laterality Date  . ABDOMINAL HYSTERECTOMY    . CATARACT EXTRACTION, BILATERAL    . EXPLORATORY LAPAROTOMY W/ BOWEL RESECTION     10/19/12-11/01/12 [facility] for exploratory laparotomty with extensive lysis of adhesion and segmental small bowel resection with end to end anastomosis per Dr. Isidore Moos 10/22/12  . MASTECTOMY  1980   left side  . TONSILLECTOMY    . TOTAL KNEE  ARTHROPLASTY  2010   left knee    Allergies  Allergen Reactions  . Cortisone       Medication List       Accurate as of 03/03/16  4:18 PM. Always use your most recent med list.          acetaminophen 325 MG tablet Commonly known as:  TYLENOL Take 650 mg by mouth every 4 (four) hours as needed for moderate pain.   antiseptic oral rinse Liqd 15 mLs by Mouth Rinse  route as needed for dry mouth.   aspirin 81 MG chewable tablet Chew 81 mg by mouth every morning.   carvedilol 12.5 MG tablet Commonly known as:  COREG Take 12.5 mg by mouth 2 (two) times daily with a meal.   citalopram 40 MG tablet Commonly known as:  CELEXA One daily to help depression   cloNIDine 0.1 MG tablet Commonly known as:  CATAPRES Take 0.1 mg by mouth every morning.   diltiazem 120 MG 24 hr capsule Commonly known as:  CARDIZEM CD Take 120 mg by mouth daily.   DULoxetine 20 MG capsule Commonly known as:  CYMBALTA Take 20 mg by mouth. Take one tablet twice daily   feeding supplement Liqd Take 1 Container by mouth daily as needed.   ipratropium-albuterol 0.5-2.5 (3) MG/3ML Soln Commonly known as:  DUONEB Take 3 mLs by nebulization every 6 (six) hours as needed. For shortness of breath and wheezing.   levothyroxine 50 MCG tablet Commonly known as:  SYNTHROID, LEVOTHROID Take 50 mcg by mouth every morning.   losartan 50 MG tablet Commonly known as:  COZAAR Take 50 mg by mouth daily.   Melatonin 3 MG Tbdp Take 3 mg by mouth at bedtime.   multivitamin with minerals Tabs tablet Take 1 tablet by mouth every morning.   omeprazole 20 MG capsule Commonly known as:  PRILOSEC Take 20 mg by mouth. Take one tablet daily   PEDIA-LAX 2.8 g Supp Generic drug:  Glycerin (Laxative) Place rectally as needed (for constipation).   polyethylene glycol packet Commonly known as:  MIRALAX / GLYCOLAX Take 17 g by mouth daily as needed.   polyvinyl alcohol 1.4 % ophthalmic solution Commonly known as:  LIQUIFILM TEARS Place 1 drop into both eyes 4 (four) times daily as needed.   predniSONE 5 MG tablet Commonly known as:  DELTASONE Take 5 mg by mouth daily with breakfast.   tiotropium 18 MCG inhalation capsule Commonly known as:  SPIRIVA Place 18 mcg into inhaler and inhale daily.   TUSSIN DM 10-100 MG/5ML liquid Generic drug:  Dextromethorphan-Guaifenesin Take 5  mLs by mouth every 12 (twelve) hours.   VENTOLIN HFA 108 (90 Base) MCG/ACT inhaler Generic drug:  albuterol Inhale 2 puffs into the lungs daily. Take 2 puffs Every 6 hours as needed for shortness of breath and wheezing       Review of Systems  Constitutional: Positive for fatigue. Negative for chills, diaphoresis and fever.       Generalized weakness.   HENT: Positive for hearing loss. Negative for congestion, ear discharge and ear pain.   Eyes: Negative for pain, discharge and redness.  Respiratory: Positive for cough. Negative for shortness of breath, wheezing and stridor.        Chronic hacking cough occasionally  Cardiovascular: Positive for leg swelling. Negative for chest pain and palpitations.       Trace in ankles.   Gastrointestinal: Positive for blood in stool (none recent; history of blood in  stool)). Negative for abdominal pain, constipation, diarrhea, nausea and vomiting.       Lower abd incisional hernia. C/o heartburn   Genitourinary: Positive for frequency. Negative for dysuria, flank pain, hematuria and urgency.       Q2-3 hours average.   Musculoskeletal: Positive for back pain. Negative for myalgias and neck pain.       Improved back pain. She is able to ambulates with walker on unit.  Resolution of Left hip pain since 11/25/14.  Skin: Negative for rash.       Lower abd incisional hernia-reducible.  Neurological: Positive for weakness. Negative for dizziness, tremors, seizures and headaches.  Psychiatric/Behavioral: Negative for hallucinations and suicidal ideas. The patient is not nervous/anxious.        C/o sleeps too much.    Immunization History  Administered Date(s) Administered  . Influenza,inj,Quad PF,36+ Mos 12/19/2013  . Influenza-Unspecified 01/21/2013, 12/28/2014, 01/03/2016  . PPD Test 01/02/2014  . Pneumococcal Polysaccharide-23 11/24/1999  . Td 12/28/1994  . Tdap 04/08/2013   Pertinent  Health Maintenance Due  Topic Date Due  . DEXA SCAN   05/24/1990  . PNA vac Low Risk Adult (2 of 2 - PCV13) 11/23/2000  . INFLUENZA VACCINE  Completed   Fall Risk  12/03/2014 06/08/2014  Falls in the past year? Yes No  Number falls in past yr: 1 -  Injury with Fall? No -  Risk for fall due to : History of fall(s);Impaired balance/gait;Impaired mobility Impaired balance/gait  Follow up Falls evaluation completed -   Functional Status Survey:    Vitals:   03/03/16 1520  BP: (!) 141/81  Pulse: 68  Resp: 20  Temp: 98.4 F (36.9 C)  SpO2: 94%  Weight: 123 lb (55.8 kg)  Height: 5\' 2"  (1.575 m)   Body mass index is 22.5 kg/m. Physical Exam  Constitutional: She is oriented to person, place, and time. She appears well-developed and well-nourished. No distress.  HENT:  Head: Normocephalic and atraumatic.  Right Ear: External ear normal.  Left Ear: External ear normal.  Nose: Nose normal.  Mouth/Throat: Oropharynx is clear and moist. No oropharyngeal exudate.  Eyes: Conjunctivae and EOM are normal. Pupils are equal, round, and reactive to light. Right eye exhibits no discharge. Left eye exhibits no discharge. No scleral icterus.  Neck: Normal range of motion. Neck supple. No JVD present. No tracheal deviation present. No thyromegaly present.  Cardiovascular: Normal rate, regular rhythm, normal heart sounds and intact distal pulses.   No murmur heard. Pulmonary/Chest: Effort normal. No stridor. No respiratory distress. She has decreased breath sounds. She has no wheezes. She has rales. She exhibits no tenderness.  Bibasilar, posterior coarse expiratory rhonchi.   Abdominal: Soft. She exhibits no distension and no mass. There is no tenderness.  Genitourinary: Rectal exam shows guaiac positive stool.  Musculoskeletal: Normal range of motion. She exhibits edema and tenderness.  Ankles and feet have trace edema. Mid back pain.  Unstable gait. Uses rolling walker.  Neurological: She is alert and oriented to person, place, and time. No cranial  nerve deficit. She exhibits normal muscle tone. Coordination normal.  Left hemiparesis  Skin: Skin is warm and dry. No rash noted. She is not diaphoretic. No erythema. No pallor.  Mid abd surgical scar-healed-lower abd reducible incisional hernia-the patient has concerns of cosmetic reason but denied pain.  Left mastectomy Scalp skin lesion above the left ear.   Psychiatric: She has a normal mood and affect. Her behavior is normal. Judgment and thought content  normal.  Repetitive.     Labs reviewed:  Recent Labs  05/07/15 10/17/15 01/08/16  NA 136* 138 139  K 4.3 4.8 4.5  BUN 25* 88* 29*  CREATININE 0.7 0.3* 0.6    Recent Labs  05/07/15 10/17/15 01/08/16  AST 18 18 16   ALT 14 10 10   ALKPHOS 108 104 74    Recent Labs  05/07/15 10/17/15 01/08/16  WBC 6.7 5.6 7.9  HGB 15.2 17.3* 14.7  HCT 46 52* 45  PLT 352 347 428*   Lab Results  Component Value Date   TSH 1.79 10/17/2015   No results found for: HGBA1C Lab Results  Component Value Date   CHOL 121 11/22/2012   HDL 40 11/22/2012   LDLCALC 65 11/22/2012   TRIG 81 11/22/2012    Significant Diagnostic Results in last 30 days:  No results found.  Assessment/Plan HTN (hypertension) Controlled, continue Clonidine 0.1mg , Carvedilol 12.5mg  bid, Diltiazem 120mg , Losartan 50mg  daily, monitor Bp     COPD (chronic obstructive pulmonary disease) (HCC) Stable. Continue Spiriva, Duonebprn, Albuterol for maintenance, Prednisone 5mg  daily.    GERD (gastroesophageal reflux disease) 12/31/15 pharm dc prn Famotidine.  Continue Omeprazole 20mg  daily.   Constipation Controlled. Continue MiraLax bid   Hypothyroidism Continue Levothyroxine 26mcg daily, TSH 1.79 10/17/15  Depression mood is stabilized, switched from Zoloft to Cymbalta in setting of back pain, reduced Cymbalta 20mg  daily due to c/o feeling shaky-improved. Observe. Continue Cymbalta and Celexa   Lower back pain Pain is better controlled, continue Norco  prn, Cymbalta 20mg    Edema Minimal, mostly ankles/feet    Hematest positive stools 10/19/17u Hgb 14.7     Family/ staff Communication: SNF  Labs/tests ordered:  none

## 2016-03-10 DIAGNOSIS — R531 Weakness: Secondary | ICD-10-CM | POA: Diagnosis not present

## 2016-04-03 ENCOUNTER — Encounter: Payer: Self-pay | Admitting: Nurse Practitioner

## 2016-04-03 ENCOUNTER — Non-Acute Institutional Stay (SKILLED_NURSING_FACILITY): Payer: Medicare Other | Admitting: Nurse Practitioner

## 2016-04-03 DIAGNOSIS — R609 Edema, unspecified: Secondary | ICD-10-CM | POA: Diagnosis not present

## 2016-04-03 DIAGNOSIS — M5440 Lumbago with sciatica, unspecified side: Secondary | ICD-10-CM

## 2016-04-03 DIAGNOSIS — J410 Simple chronic bronchitis: Secondary | ICD-10-CM

## 2016-04-03 DIAGNOSIS — F320 Major depressive disorder, single episode, mild: Secondary | ICD-10-CM

## 2016-04-03 DIAGNOSIS — K5900 Constipation, unspecified: Secondary | ICD-10-CM

## 2016-04-03 DIAGNOSIS — I10 Essential (primary) hypertension: Secondary | ICD-10-CM | POA: Diagnosis not present

## 2016-04-03 DIAGNOSIS — K219 Gastro-esophageal reflux disease without esophagitis: Secondary | ICD-10-CM | POA: Diagnosis not present

## 2016-04-03 NOTE — Progress Notes (Signed)
Location:  [facility] Room Number: 10 Place of Service:  SNF (31) Provider:  Lory Nowaczyk, Manxie  NP  Jeanmarie Hubert, MD  Patient Care Team: Estill Dooms, MD as PCP - General (Internal Medicine) Gaynelle Arabian, MD as Consulting Physician (Orthopedic Surgery) Angalena Cousineau Otho Darner, NP as Nurse Practitioner (Nurse Practitioner) [place] (Skilled Nursing and [place])  Extended Emergency Contact Information Primary Emergency Contact: Johnston,Betsy Address: [place], [place] Phone: [phone] Relation: Daughter Secondary Emergency Contact: Rey,Lindsey  [place] Phone: [phone] Relation: Daughter  Code Status:  DNR Goals of care: Advanced Directive information Advanced Directives 04/03/2016  Does Patient Have a Medical Advance Directive? Yes  Type of Paramedic of [place];Living will;Out of facility DNR (pink MOST or yellow form)  Does patient want to make changes to medical advance directive? No - Patient declined  Copy of [place] in Chart? Yes  Pre-existing out of facility DNR order (yellow form or pink MOST form) -     Chief Complaint  Patient presents with  . Medical Management of Chronic Issues    HPI:  Pt is a 83 y.o. female seen today for medical management of chronic diseases.   Hx of COPD, chronic cough, on Spiriva daily, Ventolin daily, DuoNeb prn, Prednisone 5mg  qd, Hx of depression and back pain, better managed with Cymbalta 20mg  daily and Celexa. Her blood pressure is well controlled, takes Losartan 50mg  and Diltiazem 120mg , Carvedilol 12.5mg  bid, Hypothyroidism, taking Levothyroxine 25mcg, TSH 1.79 10/17/15  Past Medical History:  Diagnosis Date  . Back pain, thoracic 01/12/2014  . Cancer ([place]) 1980   Breast cancer  . Constipation 11/18/2012  . COPD (chronic obstructive pulmonary disease) ([place])   . CVA  (cerebral infarction) 06/08/2011   Following abdominal surgery for bowel obstruction   . Depression 12/26/2013  . DVT (deep venous thrombosis) ([place]) 11/18/2012  . Fall 02/07/2013  . GERD (gastroesophageal reflux disease) 03/30/2014  . Granuloma annulare 06/08/2014  . HTN (hypertension) 11/18/2012   01/15/14 Bun/creat 14/0.60   . Hypertension   . Hypothyroidism   . Incisional hernia, without obstruction or gangrene 05/27/2014   Lower abd from previous surgery-2-3 small incisional hernias which are reducible-no incarceration or pain complained.    . Insomnia 11/18/2012  . Left hemiparesis ([place]) 06/08/2014  . Osteoporosis   . Shortness of breath   . Thoracic spine fracture [facility]) 12/18/2013   01/05/14 T 10 Dr. Newman Pies: PCP managing pain.  F/u 2 months(suggested that if the pain is "not too bad" and seems to be improving that she "live with it" and give a change to heal without intervention.  12/18/13 X-ray Thoracic spine: Compression deformity at the level of T10. Comparing back to a chest x-ray from 12/09/2013, this appears new in the interval.       . TIA (transient ischemic attack)   . Ventral hernia 06/08/2014   Past Surgical History:  Procedure Laterality Date  . ABDOMINAL HYSTERECTOMY    . CATARACT EXTRACTION, BILATERAL    . EXPLORATORY LAPAROTOMY W/ BOWEL RESECTION     10/19/12-11/01/12 [facility] for exploratory laparotomty with extensive lysis of adhesion and segmental small bowel resection with end to end anastomosis per Dr. Isidore Moos 10/22/12  . MASTECTOMY  1980   left side  . TONSILLECTOMY    . TOTAL KNEE ARTHROPLASTY  2010  left knee    Allergies  Allergen Reactions  . Cortisone     Allergies as of 04/03/2016      Reactions   Cortisone       Medication List       Accurate as of 04/03/16 11:59 PM. Always use your most recent med list.          acetaminophen 325 MG tablet Commonly known as:  TYLENOL Take 650 mg by mouth every 4 (four) hours as needed for moderate  pain.   antiseptic oral rinse Liqd 15 mLs by Mouth Rinse route as needed for dry mouth.   ASPERCREME LIDOCAINE 4 % Ptch Generic drug:  Lidocaine Apply 1 patch topically daily.   aspirin 81 MG chewable tablet Chew 81 mg by mouth every morning.   carvedilol 12.5 MG tablet Commonly known as:  COREG Take 12.5 mg by mouth 2 (two) times daily with a meal.   cloNIDine 0.1 MG tablet Commonly known as:  CATAPRES Take 0.1 mg by mouth every morning.   diltiazem 120 MG 24 hr capsule Commonly known as:  CARDIZEM CD Take 120 mg by mouth daily.   DULoxetine 20 MG capsule Commonly known as:  CYMBALTA Take 20 mg by mouth. Take one tablet twice daily   feeding supplement Liqd Take 1 Container by mouth daily as needed.   ipratropium-albuterol 0.5-2.5 (3) MG/3ML Soln Commonly known as:  DUONEB Take 3 mLs by nebulization every 6 (six) hours as needed. For shortness of breath and wheezing.   levothyroxine 50 MCG tablet Commonly known as:  SYNTHROID, LEVOTHROID Take 50 mcg by mouth every morning.   losartan 50 MG tablet Commonly known as:  COZAAR Take 50 mg by mouth daily.   Melatonin 3 MG Tbdp Take 3 mg by mouth at bedtime.   multivitamin with minerals Tabs tablet Take 1 tablet by mouth every morning.   omeprazole 20 MG capsule Commonly known as:  PRILOSEC Take 20 mg by mouth. Take one tablet daily   PEDIA-LAX 2.8 g Supp Generic drug:  Glycerin (Laxative) Place rectally as needed (for constipation).   polyethylene glycol packet Commonly known as:  MIRALAX / GLYCOLAX Take 17 g by mouth daily as needed.   polyvinyl alcohol 1.4 % ophthalmic solution Commonly known as:  LIQUIFILM TEARS Place 1 drop into both eyes 4 (four) times daily as needed.   predniSONE 5 MG tablet Commonly known as:  DELTASONE Take 5 mg by mouth daily with breakfast.   tiotropium 18 MCG inhalation capsule Commonly known as:  SPIRIVA Place 18 mcg into inhaler and inhale daily.   TUSSIN DM 10-100  MG/5ML liquid Generic drug:  Dextromethorphan-Guaifenesin Take 5 mLs by mouth every 12 (twelve) hours.   VENTOLIN HFA 108 (90 Base) MCG/ACT inhaler Generic drug:  albuterol Inhale 2 puffs into the lungs daily. Take 2 puffs Every 6 hours as needed for shortness of breath and wheezing       Review of Systems  Constitutional: Positive for fatigue. Negative for chills, diaphoresis and fever.       Generalized weakness.   HENT: Positive for hearing loss. Negative for congestion, ear discharge and ear pain.   Eyes: Negative for pain, discharge and redness.  Respiratory: Positive for cough. Negative for shortness of breath, wheezing and stridor.        Chronic hacking cough occasionally  Cardiovascular: Positive for leg swelling. Negative for chest pain and palpitations.       Trace in ankles.   Gastrointestinal:  Positive for blood in stool (none recent; history of blood in stool)). Negative for abdominal pain, constipation, diarrhea, nausea and vomiting.       Lower abd incisional hernia. C/o heartburn   Genitourinary: Positive for frequency. Negative for dysuria, flank pain, hematuria and urgency.       Q2-3 hours average.   Musculoskeletal: Positive for back pain. Negative for myalgias and neck pain.       Improved back pain. She is able to ambulates with walker on unit.  Resolution of Left hip pain since 11/25/14.  Skin: Negative for rash.       Lower abd incisional hernia-reducible.  Neurological: Positive for weakness. Negative for dizziness, tremors, seizures and headaches.  Psychiatric/Behavioral: Negative for hallucinations and suicidal ideas. The patient is not nervous/anxious.        C/o sleeps too much.    Immunization History  Administered Date(s) Administered  . Influenza,inj,Quad PF,36+ Mos 12/19/2013  . Influenza-Unspecified 01/21/2013, 12/28/2014, 01/03/2016  . PPD Test 01/02/2014  . Pneumococcal Polysaccharide-23 11/24/1999  . Td 12/28/1994  . Tdap 04/08/2013    Pertinent  Health Maintenance Due  Topic Date Due  . DEXA SCAN  05/24/1990  . PNA vac Low Risk Adult (2 of 2 - PCV13) 11/23/2000  . INFLUENZA VACCINE  Completed   Fall Risk  12/03/2014 06/08/2014  Falls in the past year? Yes No  Number falls in past yr: 1 -  Injury with Fall? No -  Risk for fall due to : History of fall(s);Impaired balance/gait;Impaired mobility Impaired balance/gait  Follow up Falls evaluation completed -   Functional Status Survey:    Vitals:   04/03/16 1342  BP: (!) 153/89  Pulse: 71  Resp: 18  Temp: 97.9 F (36.6 C)  SpO2: 94%  Weight: 123 lb (55.8 kg)  Height: 5\' 2"  (1.575 m)   Body mass index is 22.5 kg/m. Physical Exam  Constitutional: She is oriented to person, place, and time. She appears well-developed and well-nourished. No distress.  HENT:  Head: Normocephalic and atraumatic.  Right Ear: External ear normal.  Left Ear: External ear normal.  Nose: Nose normal.  Mouth/Throat: Oropharynx is clear and moist. No oropharyngeal exudate.  Eyes: Conjunctivae and EOM are normal. Pupils are equal, round, and reactive to light. Right eye exhibits no discharge. Left eye exhibits no discharge. No scleral icterus.  Neck: Normal range of motion. Neck supple. No JVD present. No tracheal deviation present. No thyromegaly present.  Cardiovascular: Normal rate, regular rhythm, normal heart sounds and intact distal pulses.   No murmur heard. Pulmonary/Chest: Effort normal. No stridor. No respiratory distress. She has decreased breath sounds. She has no wheezes. She has rales. She exhibits no tenderness.  Bibasilar, posterior coarse expiratory rhonchi.   Abdominal: Soft. She exhibits no distension and no mass. There is no tenderness.  Genitourinary: Rectal exam shows guaiac positive stool.  Musculoskeletal: Normal range of motion. She exhibits edema and tenderness.  Ankles and feet have trace edema. Mid back pain.  Unstable gait. Uses rolling walker.   Neurological: She is alert and oriented to person, place, and time. No cranial nerve deficit. She exhibits normal muscle tone. Coordination normal.  Left hemiparesis  Skin: Skin is warm and dry. No rash noted. She is not diaphoretic. No erythema. No pallor.  Mid abd surgical scar-healed-lower abd reducible incisional hernia-the patient has concerns of cosmetic reason but denied pain.  Left mastectomy Scalp skin lesion above the left ear.   Psychiatric: She has a normal  mood and affect. Her behavior is normal. Judgment and thought content normal.  Repetitive.     Labs reviewed:  Recent Labs  05/07/15 10/17/15 01/08/16  NA 136* 138 139  K 4.3 4.8 4.5  BUN 25* 88* 29*  CREATININE 0.7 0.3* 0.6    Recent Labs  05/07/15 10/17/15 01/08/16  AST 18 18 16   ALT 14 10 10   ALKPHOS 108 104 74    Recent Labs  05/07/15 10/17/15 01/08/16  WBC 6.7 5.6 7.9  HGB 15.2 17.3* 14.7  HCT 46 52* 45  PLT 352 347 428*   Lab Results  Component Value Date   TSH 1.79 10/17/2015   No results found for: HGBA1C Lab Results  Component Value Date   CHOL 121 11/22/2012   HDL 40 11/22/2012   LDLCALC 65 11/22/2012   TRIG 81 11/22/2012    Significant Diagnostic Results in last 30 days:  No results found.  Assessment/Plan HTN (hypertension) Controlled, continue Clonidine 0.1mg , Carvedilol 12.5mg  bid, Diltiazem 120mg , Losartan 50mg  daily, monitor Bp     COPD (chronic obstructive pulmonary disease) (HCC) Stable. Continue Spiriva, Duonebprn, Albuterol for maintenance, Prednisone 5mg  daily.    GERD (gastroesophageal reflux disease) 12/31/15 pharm dc prn Famotidine.  Continue Omeprazole 20mg  daily.   Constipation Controlled. Continue MiraLax bid   Depression mood is stabilized, switched from Zoloft to Cymbalta in setting of back pain, reduced Cymbalta 20mg  daily due to c/o feeling shaky-improved. Observe. Continue Cymbalta  Lower back pain Pain is better controlled, continue Norco prn,  Cymbalta 20mg    Edema Minimal, mostly ankles/feet       Family/ staff Communication: SNF  Labs/tests ordered:  none

## 2016-04-05 NOTE — Assessment & Plan Note (Signed)
Controlled. Continue MiraLax bid 

## 2016-04-05 NOTE — Assessment & Plan Note (Signed)
12/31/15 pharm dc prn Famotidine.  Continue Omeprazole 20mg  daily.

## 2016-04-05 NOTE — Assessment & Plan Note (Signed)
Minimal, mostly ankles/feet 

## 2016-04-05 NOTE — Assessment & Plan Note (Signed)
Pain is better controlled, continue Norco prn, Cymbalta 20mg 

## 2016-04-05 NOTE — Assessment & Plan Note (Signed)
Controlled, continue Clonidine 0.1mg , Carvedilol 12.5mg  bid, Diltiazem 120mg , Losartan 50mg  daily, monitor Bp

## 2016-04-05 NOTE — Assessment & Plan Note (Signed)
Stable. Continue Spiriva, Duonebprn, Albuterol for maintenance, Prednisone 5mg  daily.

## 2016-04-05 NOTE — Assessment & Plan Note (Signed)
mood is stabilized, switched from Zoloft to Cymbalta in setting of back pain, reduced Cymbalta 20mg  daily due to c/o feeling shaky-improved. Observe. Continue Cymbalta

## 2016-04-09 DIAGNOSIS — F411 Generalized anxiety disorder: Secondary | ICD-10-CM | POA: Diagnosis not present

## 2016-04-10 ENCOUNTER — Non-Acute Institutional Stay (SKILLED_NURSING_FACILITY): Payer: Medicare Other | Admitting: Nurse Practitioner

## 2016-04-10 ENCOUNTER — Encounter: Payer: Self-pay | Admitting: Nurse Practitioner

## 2016-04-10 DIAGNOSIS — I10 Essential (primary) hypertension: Secondary | ICD-10-CM

## 2016-04-10 DIAGNOSIS — J410 Simple chronic bronchitis: Secondary | ICD-10-CM

## 2016-04-10 DIAGNOSIS — K219 Gastro-esophageal reflux disease without esophagitis: Secondary | ICD-10-CM | POA: Diagnosis not present

## 2016-04-10 DIAGNOSIS — S22000A Wedge compression fracture of unspecified thoracic vertebra, initial encounter for closed fracture: Secondary | ICD-10-CM

## 2016-04-10 DIAGNOSIS — K5900 Constipation, unspecified: Secondary | ICD-10-CM

## 2016-04-10 DIAGNOSIS — F320 Major depressive disorder, single episode, mild: Secondary | ICD-10-CM

## 2016-04-10 DIAGNOSIS — E039 Hypothyroidism, unspecified: Secondary | ICD-10-CM

## 2016-04-10 DIAGNOSIS — R609 Edema, unspecified: Secondary | ICD-10-CM | POA: Diagnosis not present

## 2016-04-10 NOTE — Assessment & Plan Note (Signed)
Continue Levothyroxine 50mcg daily, TSH 1.79 10/17/15

## 2016-04-10 NOTE — Assessment & Plan Note (Signed)
Minimal, mostly ankles/feet 

## 2016-04-10 NOTE — Assessment & Plan Note (Signed)
Stable. Continue Spiriva, Duonebprn, Albuterol for maintenance, Prednisone 5mg  daily.

## 2016-04-10 NOTE — Progress Notes (Signed)
Location:  [facility] Room Number: 10 Place of Service:  SNF (31) Provider:  Kester Stimpson, [place] NP  Jeanmarie Hubert, MD  Patient Care Team: Estill Dooms, MD as PCP - General (Internal Medicine) Gaynelle Arabian, MD as Consulting Physician (Orthopedic Surgery) Iretta Mangrum Otho Darner, NP as Nurse Practitioner (Nurse Practitioner) [place] (Skilled Nursing and [place])  Extended Emergency Contact Information Primary Emergency Contact: Johnston,Betsy Address: [place], [place] Phone: [phone] Relation: Daughter Secondary Emergency Contact: Rey,Lindsey  [place] Phone: [phone] Relation: Daughter  Code Status:  DNR Goals of care: Advanced Directive information Advanced Directives 04/10/2016  Does Patient Have a Medical Advance Directive? Yes  Type of Paramedic of [place];Living will;Out of facility DNR (pink MOST or yellow form)  Does patient want to make changes to medical advance directive? No - Patient declined  Copy of [place] in Chart? Yes  Pre-existing out of facility DNR order (yellow form or pink MOST form) -     Chief Complaint  Patient presents with  . Acute Visit    Patient still having moments of crying.    HPI:  Pt is a 83 y.o. female seen today for an acute visit for depression symptoms, moments of crying, taking Cymbalta 40mg  daily, the patient's sleeps and eats at her baseline, c/o she is not improving physically, but she has been ambulates with walker and stable medically. Hx of shaky at higher dose of Cymbalta.     Hx of COPD, chronic cough, on Spiriva daily, Ventolin daily, DuoNeb prn, Prednisone 5mg  qd, Hx of depression and back pain, better managed with Cymbalta 40mg . Her blood pressure is well controlled, takes Losartan 50mg  and Diltiazem 120mg , Carvedilol 12.5mg  bid, Hypothyroidism, taking  Levothyroxine, last TSH 1.79 10/17/15  Past Medical History:  Diagnosis Date  . Back pain, thoracic 01/12/2014  . Cancer ([place]) 1980   Breast cancer  . Constipation 11/18/2012  . COPD (chronic obstructive pulmonary disease) ([place])   . CVA (cerebral infarction) 06/08/2011   Following abdominal surgery for bowel obstruction   . Depression 12/26/2013  . DVT (deep venous thrombosis) ([place]) 11/18/2012  . Fall 02/07/2013  . GERD (gastroesophageal reflux disease) 03/30/2014  . Granuloma annulare 06/08/2014  . HTN (hypertension) 11/18/2012   01/15/14 Bun/creat 14/0.60   . Hypertension   . Hypothyroidism   . Incisional hernia, without obstruction or gangrene 05/27/2014   Lower abd from previous surgery-2-3 small incisional hernias which are reducible-no incarceration or pain complained.    . Insomnia 11/18/2012  . Left hemiparesis ([place]) 06/08/2014  . Osteoporosis   . Shortness of breath   . Thoracic spine fracture [facility]) 12/18/2013   01/05/14 T 10 Dr. Newman Pies: PCP managing pain.  F/u 2 months(suggested that if the pain is "not too bad" and seems to be improving that she "live with it" and give a change to heal without intervention.  12/18/13 X-ray Thoracic spine: Compression deformity at the level of T10. Comparing back to a chest x-ray from 12/09/2013, this appears new in the interval.       . TIA (transient ischemic attack)   . Ventral hernia 06/08/2014   Past Surgical History:  Procedure Laterality Date  . ABDOMINAL HYSTERECTOMY    . CATARACT EXTRACTION, BILATERAL    . EXPLORATORY LAPAROTOMY W/ BOWEL RESECTION     10/19/12-11/01/12 [facility]otomty with extensive lysis of adhesion and segmental small bowel resection with end to end anastomosis per Dr. Isidore Moos 10/22/12  . MASTECTOMY  1980   left side  . TONSILLECTOMY    . TOTAL KNEE ARTHROPLASTY  2010   left knee    Allergies  Allergen Reactions  . Cortisone     Allergies as of 04/10/2016      Reactions   Cortisone        Medication List       Accurate as of 04/10/16 11:37 AM. Always use your most recent med list.          acetaminophen 325 MG tablet Commonly known as:  TYLENOL Take 650 mg by mouth every 4 (four) hours as needed for moderate pain.   antiseptic oral rinse Liqd 15 mLs by Mouth Rinse route as needed for dry mouth.   ASPERCREME LIDOCAINE 4 % Ptch Generic drug:  Lidocaine Apply 1 patch topically daily.   aspirin 81 MG chewable tablet Chew 81 mg by mouth every morning.   carvedilol 12.5 MG tablet Commonly known as:  COREG Take 12.5 mg by mouth 2 (two) times daily with a meal.   cloNIDine 0.1 MG tablet Commonly known as:  CATAPRES Take 0.1 mg by mouth every morning.   diltiazem 120 MG 24 hr capsule Commonly known as:  CARDIZEM CD Take 120 mg by mouth daily.   DULoxetine 20 MG capsule Commonly known as:  CYMBALTA Take 20 mg by mouth. Take one tablet twice daily   feeding supplement Liqd Take 1 Container by mouth daily as needed.   ipratropium-albuterol 0.5-2.5 (3) MG/3ML Soln Commonly known as:  DUONEB Take 3 mLs by nebulization every 6 (six) hours as needed. For shortness of breath and wheezing.   levothyroxine 50 MCG tablet Commonly known as:  SYNTHROID, LEVOTHROID Take 50 mcg by mouth every morning.   losartan 50 MG tablet Commonly known as:  COZAAR Take 50 mg by mouth daily.   Melatonin 3 MG Tbdp Take 3 mg by mouth at bedtime.   multivitamin with minerals Tabs tablet Take 1 tablet by mouth every morning.   omeprazole 20 MG capsule Commonly known as:  PRILOSEC Take 20 mg by mouth. Take one tablet daily   PEDIA-LAX 2.8 g Supp Generic drug:  Glycerin (Laxative) Place rectally as needed (for constipation).   polyethylene glycol packet Commonly known as:  MIRALAX / GLYCOLAX Take 17 g by mouth daily as needed.   polyvinyl alcohol 1.4 % ophthalmic solution Commonly known as:  LIQUIFILM TEARS Place 1 drop into both eyes 4 (four) times daily as  needed.   predniSONE 5 MG tablet Commonly known as:  DELTASONE Take 5 mg by mouth daily with breakfast.   tiotropium 18 MCG inhalation capsule Commonly known as:  SPIRIVA Place 18 mcg into inhaler and inhale daily.   TUSSIN DM 10-100 MG/5ML liquid Generic drug:  Dextromethorphan-Guaifenesin Take 5 mLs by mouth every 12 (twelve) hours.   VENTOLIN HFA 108 (90 Base) MCG/ACT inhaler Generic drug:  albuterol Inhale 2 puffs into the lungs daily. Take 2 puffs Every 6 hours as needed for shortness of breath and wheezing       Review of Systems  Constitutional: Positive for fatigue. Negative for chills, diaphoresis and fever.       Generalized weakness.   HENT: Positive for hearing loss. Negative for congestion, ear discharge and ear pain.   Eyes: Negative for pain, discharge and redness.  Respiratory:  Positive for cough. Negative for shortness of breath, wheezing and stridor.        Chronic hacking cough occasionally  Cardiovascular: Positive for leg swelling. Negative for chest pain and palpitations.       Trace in ankles.   Gastrointestinal: Positive for blood in stool (none recent; history of blood in stool)). Negative for abdominal pain, constipation, diarrhea, nausea and vomiting.       Lower abd incisional hernia. C/o heartburn   Genitourinary: Positive for frequency. Negative for dysuria, flank pain, hematuria and urgency.       Q2-3 hours average.   Musculoskeletal: Positive for back pain. Negative for myalgias and neck pain.       Improved back pain. She is able to ambulates with walker on unit.  Resolution of Left hip pain since 11/25/14.  Skin: Negative for rash.       Lower abd incisional hernia-reducible.  Neurological: Positive for weakness. Negative for dizziness, tremors, seizures and headaches.  Psychiatric/Behavioral: Negative for hallucinations and suicidal ideas. The patient is not nervous/anxious.        C/o sleeps too much.    Immunization History    Administered Date(s) Administered  . Influenza,inj,Quad PF,36+ Mos 12/19/2013  . Influenza-Unspecified 01/21/2013, 12/28/2014, 01/03/2016  . PPD Test 01/02/2014  . Pneumococcal Polysaccharide-23 11/24/1999  . Td 12/28/1994  . Tdap 04/08/2013   Pertinent  Health Maintenance Due  Topic Date Due  . DEXA SCAN  05/24/1990  . PNA vac Low Risk Adult (2 of 2 - PCV13) 11/23/2000  . INFLUENZA VACCINE  Completed   Fall Risk  12/03/2014 06/08/2014  Falls in the past year? Yes No  Number falls in past yr: 1 -  Injury with Fall? No -  Risk for fall due to : History of fall(s);Impaired balance/gait;Impaired mobility Impaired balance/gait  Follow up Falls evaluation completed -   Functional Status Survey:    Vitals:   04/10/16 0941  BP: 137/70  Pulse: 70  Resp: 18  Temp: 97.7 F (36.5 C)  SpO2: 94%  Weight: 123 lb (55.8 kg)  Height: 5\' 2"  (1.575 m)   Body mass index is 22.5 kg/m. Physical Exam  Constitutional: She is oriented to person, place, and time. She appears well-developed and well-nourished. No distress.  HENT:  Head: Normocephalic and atraumatic.  Right Ear: External ear normal.  Left Ear: External ear normal.  Nose: Nose normal.  Mouth/Throat: Oropharynx is clear and moist. No oropharyngeal exudate.  Eyes: Conjunctivae and EOM are normal. Pupils are equal, round, and reactive to light. Right eye exhibits no discharge. Left eye exhibits no discharge. No scleral icterus.  Neck: Normal range of motion. Neck supple. No JVD present. No tracheal deviation present. No thyromegaly present.  Cardiovascular: Normal rate, regular rhythm, normal heart sounds and intact distal pulses.   No murmur heard. Pulmonary/Chest: Effort normal. No stridor. No respiratory distress. She has decreased breath sounds. She has no wheezes. She has rales. She exhibits no tenderness.  Bibasilar, posterior coarse expiratory rhonchi.   Abdominal: Soft. She exhibits no distension and no mass. There is no  tenderness.  Genitourinary: Rectal exam shows guaiac positive stool.  Musculoskeletal: Normal range of motion. She exhibits edema and tenderness.  Ankles and feet have trace edema. Mid back pain.  Unstable gait. Uses rolling walker.  Neurological: She is alert and oriented to person, place, and time. No cranial nerve deficit. She exhibits normal muscle tone. Coordination normal.  Left hemiparesis  Skin: Skin is warm and dry.  No rash noted. She is not diaphoretic. No erythema. No pallor.  Mid abd surgical scar-healed-lower abd reducible incisional hernia-the patient has concerns of cosmetic reason but denied pain.  Left mastectomy Scalp skin lesion above the left ear.   Psychiatric: She has a normal mood and affect. Her behavior is normal. Judgment and thought content normal.  Repetitive.     Labs reviewed:  Recent Labs  05/07/15 10/17/15 01/08/16  NA 136* 138 139  K 4.3 4.8 4.5  BUN 25* 88* 29*  CREATININE 0.7 0.3* 0.6    Recent Labs  05/07/15 10/17/15 01/08/16  AST 18 18 16   ALT 14 10 10   ALKPHOS 108 104 74    Recent Labs  05/07/15 10/17/15 01/08/16  WBC 6.7 5.6 7.9  HGB 15.2 17.3* 14.7  HCT 46 52* 45  PLT 352 347 428*   Lab Results  Component Value Date   TSH 1.79 10/17/2015   No results found for: HGBA1C Lab Results  Component Value Date   CHOL 121 11/22/2012   HDL 40 11/22/2012   LDLCALC 65 11/22/2012   TRIG 81 11/22/2012    Significant Diagnostic Results in last 30 days:  No results found.  Assessment/Plan Depression Continue Cymbalta 40mg  daily, continue to monitor s/s of depression.   HTN (hypertension) Controlled, continue Clonidine 0.1mg , Carvedilol 12.5mg  bid, Diltiazem 120mg , Losartan 50mg  daily, monitor Bp     COPD (chronic obstructive pulmonary disease) (HCC) Stable. Continue Spiriva, Duonebprn, Albuterol for maintenance, Prednisone 5mg  daily.    GERD (gastroesophageal reflux disease) 12/31/15 pharm dc prn Famotidine.  Continue  Omeprazole 20mg  daily.   Constipation Controlled. Continue MiraLax bid   Hypothyroidism Continue Levothyroxine 18mcg daily, TSH 1.79 10/17/15  Thoracic spine fracture Pain is better controlled, continue Norco prn, Cymbalta 40mg    Edema Minimal, mostly ankles/feet       Family/ staff Communication: SNF  Labs/tests ordered:  none

## 2016-04-10 NOTE — Assessment & Plan Note (Addendum)
Pain is better controlled, continue Norco prn, Cymbalta 40mg 

## 2016-04-10 NOTE — Assessment & Plan Note (Signed)
Continue Cymbalta 40mg  daily, continue to monitor s/s of depression.

## 2016-04-10 NOTE — Assessment & Plan Note (Signed)
Controlled. Continue MiraLax bid 

## 2016-04-10 NOTE — Assessment & Plan Note (Signed)
Controlled, continue Clonidine 0.1mg , Carvedilol 12.5mg  bid, Diltiazem 120mg , Losartan 50mg  daily, monitor Bp

## 2016-04-10 NOTE — Assessment & Plan Note (Signed)
12/31/15 pharm dc prn Famotidine.  Continue Omeprazole 20mg  daily.

## 2016-04-13 DIAGNOSIS — E639 Nutritional deficiency, unspecified: Secondary | ICD-10-CM | POA: Diagnosis not present

## 2016-04-13 LAB — CBC AND DIFFERENTIAL
HCT: 58 % — AB (ref 36–46)
HEMOGLOBIN: 19.4 g/dL — AB (ref 12.0–16.0)
Platelets: 373 10*3/uL (ref 150–399)
WBC: 7.2 10^3/mL

## 2016-04-14 ENCOUNTER — Other Ambulatory Visit: Payer: Self-pay | Admitting: *Deleted

## 2016-04-23 ENCOUNTER — Telehealth: Payer: Self-pay | Admitting: Oncology

## 2016-04-23 NOTE — Telephone Encounter (Signed)
Appt has been scheduled w/Edith at the nursing home for the pt to see Dr. Alen Blew on 2/16 at [facility] to have the pt at the cancer center by 1030am so that she can be checked in on time. Voiced understanding and agreed to the appt date and time.

## 2016-04-30 DIAGNOSIS — Z961 Presence of intraocular lens: Secondary | ICD-10-CM | POA: Diagnosis not present

## 2016-04-30 DIAGNOSIS — H5340 Unspecified visual field defects: Secondary | ICD-10-CM | POA: Diagnosis not present

## 2016-04-30 DIAGNOSIS — H5203 Hypermetropia, bilateral: Secondary | ICD-10-CM | POA: Diagnosis not present

## 2016-05-04 ENCOUNTER — Non-Acute Institutional Stay (SKILLED_NURSING_FACILITY): Payer: Medicare Other | Admitting: Internal Medicine

## 2016-05-04 ENCOUNTER — Encounter: Payer: Self-pay | Admitting: Internal Medicine

## 2016-05-04 DIAGNOSIS — F320 Major depressive disorder, single episode, mild: Secondary | ICD-10-CM | POA: Diagnosis not present

## 2016-05-04 DIAGNOSIS — D582 Other hemoglobinopathies: Secondary | ICD-10-CM | POA: Diagnosis not present

## 2016-05-04 DIAGNOSIS — E039 Hypothyroidism, unspecified: Secondary | ICD-10-CM | POA: Diagnosis not present

## 2016-05-04 DIAGNOSIS — I10 Essential (primary) hypertension: Secondary | ICD-10-CM

## 2016-05-04 DIAGNOSIS — G8194 Hemiplegia, unspecified affecting left nondominant side: Secondary | ICD-10-CM | POA: Diagnosis not present

## 2016-05-04 DIAGNOSIS — K219 Gastro-esophageal reflux disease without esophagitis: Secondary | ICD-10-CM

## 2016-05-04 NOTE — Progress Notes (Signed)
Facility  [facility] Room Number: N10  Place of Service: SNF (31)     Allergies  Allergen Reactions  . Cortisone     Chief Complaint  Patient presents with  . Medical Management of Chronic Issues    HPI:  Essential hypertension - controlled  Gastroesophageal reflux disease without esophagitis - asymptomatic on omeprazole  Mild single current episode of major depressive disorder ([place]) - improved on Cymbalta  Hypothyroidism, unspecified type - compensated  Abnormal hemoglobin (Hgb) (HCC) - polycythemia with soe fluctutions  Left hemiparesis (HCC) -- persistent and unchanged    Medications: Patient's Medications  New Prescriptions   No medications on file  Previous Medications   ACETAMINOPHEN (TYLENOL) 325 MG TABLET    Take 650 mg by mouth every 4 (four) hours as needed for moderate pain.   ALBUTEROL (VENTOLIN HFA) 108 (90 BASE) MCG/ACT INHALER    Inhale 2 puffs into the lungs daily. Take 2 puffs Every 6 hours as needed for shortness of breath and wheezing   ANTISEPTIC ORAL RINSE (BIOTENE) LIQD    15 mLs by Mouth Rinse route as needed for dry mouth.   ASPIRIN 81 MG CHEWABLE TABLET    Chew 81 mg by mouth every morning.   CARVEDILOL (COREG) 12.5 MG TABLET    Take 12.5 mg by mouth 2 (two) times daily with a meal.   CLONIDINE (CATAPRES) 0.1 MG TABLET    Take 0.1 mg by mouth every morning.    DEXTROMETHORPHAN-GUAIFENESIN (TUSSIN DM) 10-100 MG/5ML LIQUID    Take 5 mLs by mouth every 12 (twelve) hours.   DILTIAZEM (CARDIZEM CD) 120 MG 24 HR CAPSULE    Take 120 mg by mouth daily.    DULOXETINE (CYMBALTA) 20 MG CAPSULE    Take 20 mg by mouth. Take one tablet twice daily   FEEDING SUPPLEMENT (BOOST / RESOURCE BREEZE) LIQD    Take 1 Container by mouth daily as needed.    GLYCERIN, LAXATIVE, (PEDIA-LAX) 2.8 G SUPP    Place rectally as needed (for constipation).   IPRATROPIUM-ALBUTEROL (DUONEB) 0.5-2.5 (3) MG/3ML SOLN    Take 3 mLs by nebulization every 6 (six) hours as  needed. For shortness of breath and wheezing.   LEVOTHYROXINE (SYNTHROID, LEVOTHROID) 50 MCG TABLET    Take 50 mcg by mouth every morning.    LIDOCAINE (ASPERCREME LIDOCAINE) 4 % PTCH    Apply 1 patch topically daily.   LOSARTAN (COZAAR) 50 MG TABLET    Take 50 mg by mouth daily.   MELATONIN 3 MG TBDP    Take 3 mg by mouth at bedtime.   MULTIPLE VITAMIN (MULTIVITAMIN WITH MINERALS) TABS TABLET    Take 1 tablet by mouth every morning.   OMEPRAZOLE (PRILOSEC) 20 MG CAPSULE    Take 20 mg by mouth. Take one tablet daily   POLYETHYLENE GLYCOL (MIRALAX / GLYCOLAX) PACKET    Take 17 g by mouth daily as needed.    POLYVINYL ALCOHOL (LIQUIFILM TEARS) 1.4 % OPHTHALMIC SOLUTION    Place 1 drop into both eyes 4 (four) times daily as needed.    PREDNISONE (DELTASONE) 5 MG TABLET    Take 5 mg by mouth daily with breakfast.   TIOTROPIUM (SPIRIVA) 18 MCG INHALATION CAPSULE    Place 18 mcg into inhaler and inhale daily.  Modified Medications   No medications on file  Discontinued Medications   No medications on file     Review of Systems  Constitutional: Positive for fatigue.  Negative for chills, diaphoresis and fever.       Generalized weakness.   HENT: Positive for hearing loss. Negative for congestion, ear discharge and ear pain.   Eyes: Negative for pain, discharge and redness.  Respiratory: Positive for cough. Negative for shortness of breath, wheezing and stridor.        Chronic hacking cough occasionally  Cardiovascular: Positive for leg swelling. Negative for chest pain and palpitations.       Trace in ankles.   Gastrointestinal: Positive for blood in stool (none recent; history of blood in stool)). Negative for abdominal pain, constipation, diarrhea, nausea and vomiting.       Lower abd incisional hernia. C/o heartburn   Genitourinary: Positive for frequency. Negative for dysuria, flank pain, hematuria and urgency.       Q2-3 hours average.   Musculoskeletal: Positive for back pain. Negative  for myalgias and neck pain.       Improved back pain. She is able to ambulates with walker on unit.  Resolution of Left hip pain since 11/25/14.  Skin: Negative for rash.       Lower abd incisional hernia-reducible.  Neurological: Positive for weakness. Negative for dizziness, tremors, seizures and headaches.       Mild left hemiparesis  Psychiatric/Behavioral: Negative for hallucinations and suicidal ideas. The patient is not nervous/anxious.        C/o sleeps too much.    Vitals:   05/04/16 1426  BP: 112/62  Pulse: 70  Resp: 18  Temp: 97.6 F (36.4 C)  SpO2: 94%  Weight: 123 lb (55.8 kg)  Height: '5\' 2"'  (1.575 m)   Wt Readings from Last 3 Encounters:  05/04/16 123 lb (55.8 kg)  04/10/16 123 lb (55.8 kg)  04/03/16 123 lb (55.8 kg)    Body mass index is 22.5 kg/m.  Physical Exam  Constitutional: She is oriented to person, place, and time. She appears well-developed and well-nourished. No distress.  frail  HENT:  Head: Normocephalic and atraumatic.  Right Ear: External ear normal.  Left Ear: External ear normal.  Nose: Nose normal.  Mouth/Throat: Oropharynx is clear and moist. No oropharyngeal exudate.  Eyes: Conjunctivae and EOM are normal. Pupils are equal, round, and reactive to light. Right eye exhibits no discharge. Left eye exhibits no discharge. No scleral icterus.  Neck: Normal range of motion. Neck supple. No JVD present. No tracheal deviation present. No thyromegaly present.  Cardiovascular: Normal rate, regular rhythm, normal heart sounds and intact distal pulses.   No murmur heard. Pulmonary/Chest: Effort normal. No stridor. No respiratory distress. She has decreased breath sounds. She has no wheezes. She has rales. She exhibits no tenderness.  Bibasilar, posterior coarse expiratory rhonchi.   Abdominal: Soft. She exhibits no distension and no mass. There is no tenderness.  Genitourinary: Rectal exam shows guaiac positive stool.  Musculoskeletal: Normal range  of motion. She exhibits edema and tenderness.  Ankles and feet have trace edema. Mid back pain.  Unstable gait. Uses rolling walker.  Neurological: She is alert and oriented to person, place, and time. No cranial nerve deficit. She exhibits normal muscle tone. Coordination normal.  Left hemiparesis  Skin: Skin is warm and dry. No rash noted. She is not diaphoretic. No erythema. No pallor.  Mid abd surgical scar-healed-lower abd reducible incisional hernia-the patient has concerns of cosmetic reason but denied pain.  Left mastectomy Scalp skin lesion above the left ear.   Psychiatric: She has a normal mood and affect. Her behavior is  normal. Judgment and thought content normal.  Repetitive.      Labs reviewed: Lab Summary Latest Ref Rng & Units 04/13/2016 01/08/2016 10/17/2015 05/07/2015  Hemoglobin 12.0 - 16.0 g/dL 19.4(A) 14.7 17.3(A) 15.2  Hematocrit 36 - 46 % 58(A) 45 52(A) 46  White count 10:3/mL 7.2 7.9 5.6 6.7  Platelet count 150 - 399 K/L 373 428(A) 347 352  Sodium 137 - 147 mmol/L (None) 139 138 136(A)  Potassium 3.4 - 5.3 mmol/L (None) 4.5 4.8 4.3  Calcium - (None) (None) (None) (None)  Phosphorus - (None) (None) (None) (None)  Creatinine 0.5 - 1.1 mg/dL (None) 0.6 0.3(A) 0.7  AST 13 - 35 U/L (None) '16 18 18  ' Alk Phos 25 - 125 U/L (None) 74 104 108  Bilirubin - (None) (None) (None) (None)  Glucose mg/dL (None) 97 88 173  Cholesterol - (None) (None) (None) (None)  HDL cholesterol - (None) (None) (None) (None)  Triglycerides - (None) (None) (None) (None)  LDL Direct - (None) (None) (None) (None)  LDL Calc - (None) (None) (None) (None)  Total protein - (None) (None) (None) (None)  Albumin - (None) (None) (None) (None)  Some recent data might be hidden   Lab Results  Component Value Date   TSH 1.79 10/17/2015   Lab Results  Component Value Date   BUN 29 (A) 01/08/2016   BUN 88 (A) 10/17/2015   BUN 25 (A) 05/07/2015   Lab Results  Component Value Date   CREATININE  0.6 01/08/2016   CREATININE 0.3 (A) 10/17/2015   CREATININE 0.7 05/07/2015   No results found for: HGBA1C     Assessment/Plan  1. Essential hypertension controlled  2. Gastroesophageal reflux disease without esophagitis The current medical regimen is effective;  continue present plan and medications.  3. Mild single current episode of major depressive disorder ([place]) The current medical regimen is effective;  continue present plan and medications.  4. Hypothyroidism, unspecified type compensated  5. Abnormal hemoglobin (Hgb) (HCC) Continue to monitor lab  6. Left hemiparesis ([place]) unchanged

## 2016-05-05 ENCOUNTER — Encounter: Payer: Self-pay | Admitting: Nurse Practitioner

## 2016-05-05 ENCOUNTER — Non-Acute Institutional Stay (SKILLED_NURSING_FACILITY): Payer: Medicare Other | Admitting: Nurse Practitioner

## 2016-05-05 DIAGNOSIS — I10 Essential (primary) hypertension: Secondary | ICD-10-CM | POA: Diagnosis not present

## 2016-05-05 DIAGNOSIS — R609 Edema, unspecified: Secondary | ICD-10-CM | POA: Diagnosis not present

## 2016-05-05 DIAGNOSIS — E039 Hypothyroidism, unspecified: Secondary | ICD-10-CM | POA: Diagnosis not present

## 2016-05-05 DIAGNOSIS — F320 Major depressive disorder, single episode, mild: Secondary | ICD-10-CM

## 2016-05-05 DIAGNOSIS — F5101 Primary insomnia: Secondary | ICD-10-CM | POA: Diagnosis not present

## 2016-05-05 DIAGNOSIS — K5900 Constipation, unspecified: Secondary | ICD-10-CM | POA: Diagnosis not present

## 2016-05-05 DIAGNOSIS — J410 Simple chronic bronchitis: Secondary | ICD-10-CM

## 2016-05-05 DIAGNOSIS — D582 Other hemoglobinopathies: Secondary | ICD-10-CM

## 2016-05-05 DIAGNOSIS — K219 Gastro-esophageal reflux disease without esophagitis: Secondary | ICD-10-CM | POA: Diagnosis not present

## 2016-05-05 NOTE — Progress Notes (Signed)
Location:  [address] Room Number: [place] of Service:  SNF (31) Provider:  Dionna Wiedemann, [place] NP  Jeanmarie Hubert, MD  Patient Care Team: Estill Dooms, MD as PCP - General (Internal Medicine) Gaynelle Arabian, MD as Consulting Physician (Orthopedic Surgery) Leza Apsey Otho Darner, NP as Nurse Practitioner (Nurse Practitioner) [place] (Skilled Nursing and [facility])  Extended Emergency Contact Information Primary Emergency Contact: Johnston,Betsy Address: [place], [place] Phone: [phone] Relation: Daughter Secondary Emergency Contact: Rey,Lindsey  [place] Phone: [phone] Relation: Daughter  Code Status:  DNR Goals of care: Advanced Directive information Advanced Directives 05/04/2016  Does Patient Have a Medical Advance Directive? Yes  Type of Paramedic of [place];Living will;Out of facility DNR (pink MOST or yellow form)  Does patient want to make changes to medical advance directive? No - Patient declined  Copy of [place] in Chart? Yes  Pre-existing out of facility DNR order (yellow form or pink MOST form) Yellow form placed in chart (order not valid for inpatient use)     Chief Complaint  Patient presents with  . Medical Management of Chronic Issues    HPI:  Pt is a 83 y.o. female seen today for evaluation of chronic medical conditions  Hx of COPD, chronic cough, on Spiriva daily, Ventolin HFA daily, DuoNeb prn, Prednisone 5mg  qd, Hx of depression and back pain, better managed with Cymbalta 20mg  bid, blood pressure is well controlled, takes Losartan 50mg  and Diltiazem 120mg , Clonidine 0.1mg  qam, Carvedilol 12.5mg  bid, Hypothyroidism, taking Levothyroxine 64mcg, last TSH 1.79 10/17/15  Past Medical History:  Diagnosis Date  . Back pain, thoracic 01/12/2014  . Cancer ([place]) 1980   Breast cancer  . Constipation  11/18/2012  . COPD (chronic obstructive pulmonary disease) ([place])   . CVA (cerebral infarction) 06/08/2011   Following abdominal surgery for bowel obstruction   . Depression 12/26/2013  . DVT (deep venous thrombosis) ([place]) 11/18/2012  . Fall 02/07/2013  . GERD (gastroesophageal reflux disease) 03/30/2014  . Granuloma annulare 06/08/2014  . HTN (hypertension) 11/18/2012   01/15/14 Bun/creat 14/0.60   . Hypertension   . Hypothyroidism   . Incisional hernia, without obstruction or gangrene 05/27/2014   Lower abd from previous surgery-2-3 small incisional hernias which are reducible-no incarceration or pain complained.    . Insomnia 11/18/2012  . Left hemiparesis ([place]) 06/08/2014  . Osteoporosis   . Shortness of breath   . Thoracic spine fracture [facility]) 12/18/2013   01/05/14 T 10 Dr. Newman Pies: PCP managing pain.  F/u 2 months(suggested that if the pain is "not too bad" and seems to be improving that she "live with it" and give a change to heal without intervention.  12/18/13 X-ray Thoracic spine: Compression deformity at the level of T10. Comparing back to a chest x-ray from 12/09/2013, this appears new in the interval.       . TIA (transient ischemic attack)   . Ventral hernia 06/08/2014   Past Surgical History:  Procedure Laterality Date  . ABDOMINAL HYSTERECTOMY    . CATARACT EXTRACTION, BILATERAL    . EXPLORATORY LAPAROTOMY W/ BOWEL RESECTION     10/19/12-11/01/12 [facility] for exploratory laparotomty with extensive lysis of adhesion and segmental small bowel resection with end to end anastomosis per Dr. Isidore Moos 10/22/12  . MASTECTOMY  1980   left side  .  TONSILLECTOMY    . TOTAL KNEE ARTHROPLASTY  2010   left knee    Allergies  Allergen Reactions  . Cortisone     Allergies as of 05/05/2016      Reactions   Cortisone       Medication List       Accurate as of 05/05/16  2:10 PM. Always use your most recent med list.          acetaminophen 325 MG tablet Commonly known as:   TYLENOL Take 650 mg by mouth every 4 (four) hours as needed for moderate pain.   antiseptic oral rinse Liqd 15 mLs by Mouth Rinse route as needed for dry mouth.   ASPERCREME LIDOCAINE 4 % Ptch Generic drug:  Lidocaine Apply 1 patch topically daily.   aspirin 81 MG chewable tablet Chew 81 mg by mouth every morning.   carvedilol 12.5 MG tablet Commonly known as:  COREG Take 12.5 mg by mouth 2 (two) times daily with a meal.   cloNIDine 0.1 MG tablet Commonly known as:  CATAPRES Take 0.1 mg by mouth every morning.   diltiazem 120 MG 24 hr capsule Commonly known as:  CARDIZEM CD Take 120 mg by mouth daily.   DULoxetine 20 MG capsule Commonly known as:  CYMBALTA Take 20 mg by mouth. Take one tablet twice daily   feeding supplement Liqd Take 1 Container by mouth daily as needed.   ipratropium-albuterol 0.5-2.5 (3) MG/3ML Soln Commonly known as:  DUONEB Take 3 mLs by nebulization every 6 (six) hours as needed. For shortness of breath and wheezing.   levothyroxine 50 MCG tablet Commonly known as:  SYNTHROID, LEVOTHROID Take 50 mcg by mouth every morning.   losartan 50 MG tablet Commonly known as:  COZAAR Take 50 mg by mouth daily.   Melatonin 3 MG Tbdp Take 3 mg by mouth at bedtime.   multivitamin with minerals Tabs tablet Take 1 tablet by mouth every morning.   omeprazole 20 MG capsule Commonly known as:  PRILOSEC Take 20 mg by mouth. Take one tablet daily   PEDIA-LAX 2.8 g Supp Generic drug:  Glycerin (Laxative) Place rectally as needed (for constipation).   polyethylene glycol packet Commonly known as:  MIRALAX / GLYCOLAX Take 17 g by mouth daily as needed.   polyvinyl alcohol 1.4 % ophthalmic solution Commonly known as:  LIQUIFILM TEARS Place 1 drop into both eyes 4 (four) times daily as needed.   predniSONE 5 MG tablet Commonly known as:  DELTASONE Take 5 mg by mouth daily with breakfast.   tiotropium 18 MCG inhalation capsule Commonly known as:   SPIRIVA Place 18 mcg into inhaler and inhale daily.   TUSSIN DM 10-100 MG/5ML liquid Generic drug:  Dextromethorphan-Guaifenesin Take 5 mLs by mouth every 12 (twelve) hours.   VENTOLIN HFA 108 (90 Base) MCG/ACT inhaler Generic drug:  albuterol Inhale 2 puffs into the lungs daily. Take 2 puffs Every 6 hours as needed for shortness of breath and wheezing       Review of Systems  Constitutional: Positive for fatigue. Negative for chills, diaphoresis and fever.       Generalized weakness.   HENT: Positive for hearing loss. Negative for congestion, ear discharge and ear pain.   Eyes: Negative for pain, discharge and redness.  Respiratory: Positive for cough. Negative for shortness of breath, wheezing and stridor.        Chronic hacking cough occasionally  Cardiovascular: Positive for leg swelling. Negative for chest pain and  palpitations.       Trace in ankles.   Gastrointestinal: Positive for blood in stool (none recent; history of blood in stool)). Negative for abdominal pain, constipation, diarrhea, nausea and vomiting.       Lower abd incisional hernia. C/o heartburn   Genitourinary: Positive for frequency. Negative for dysuria, flank pain, hematuria and urgency.       Q2-3 hours average.   Musculoskeletal: Positive for back pain. Negative for myalgias and neck pain.       Improved back pain. She is able to ambulates with walker on unit.  Resolution of Left hip pain since 11/25/14.  Skin: Negative for rash.       Lower abd incisional hernia-reducible.  Neurological: Positive for weakness. Negative for dizziness, tremors, seizures and headaches.  Psychiatric/Behavioral: Negative for hallucinations and suicidal ideas. The patient is not nervous/anxious.        C/o sleeps too much.    Immunization History  Administered Date(s) Administered  . Influenza,inj,Quad PF,36+ Mos 12/19/2013  . Influenza-Unspecified 01/21/2013, 12/28/2014, 01/03/2016  . PPD Test 01/02/2014  . Pneumococcal  Polysaccharide-23 11/24/1999  . Td 12/28/1994  . Tdap 04/08/2013   Pertinent  Health Maintenance Due  Topic Date Due  . DEXA SCAN  05/24/1990  . PNA vac Low Risk Adult (2 of 2 - PCV13) 11/23/2000  . INFLUENZA VACCINE  Completed   Fall Risk  12/03/2014 06/08/2014  Falls in the past year? Yes No  Number falls in past yr: 1 -  Injury with Fall? No -  Risk for fall due to : History of fall(s);Impaired balance/gait;Impaired mobility Impaired balance/gait  Follow up Falls evaluation completed -   Functional Status Survey:    Vitals:   05/05/16 1350  BP: 112/62  Pulse: 68  Resp: 18  Temp: 97.6 F (36.4 C)  SpO2: 94%   There is no height or weight on file to calculate BMI. Physical Exam  Constitutional: She is oriented to person, place, and time. She appears well-developed and well-nourished. No distress.  HENT:  Head: Normocephalic and atraumatic.  Right Ear: External ear normal.  Left Ear: External ear normal.  Nose: Nose normal.  Mouth/Throat: Oropharynx is clear and moist. No oropharyngeal exudate.  Eyes: Conjunctivae and EOM are normal. Pupils are equal, round, and reactive to light. Right eye exhibits no discharge. Left eye exhibits no discharge. No scleral icterus.  Neck: Normal range of motion. Neck supple. No JVD present. No tracheal deviation present. No thyromegaly present.  Cardiovascular: Normal rate, regular rhythm, normal heart sounds and intact distal pulses.   No murmur heard. Pulmonary/Chest: Effort normal. No stridor. No respiratory distress. She has decreased breath sounds. She has no wheezes. She has rales. She exhibits no tenderness.  Bibasilar, posterior coarse expiratory rhonchi.   Abdominal: Soft. She exhibits no distension and no mass. There is no tenderness.  Genitourinary: Rectal exam shows guaiac positive stool.  Musculoskeletal: Normal range of motion. She exhibits edema and tenderness.  Ankles and feet have trace edema. Mid back pain.  Unstable  gait. Uses rolling walker.  Neurological: She is alert and oriented to person, place, and time. No cranial nerve deficit. She exhibits normal muscle tone. Coordination normal.  Left hemiparesis  Skin: Skin is warm and dry. No rash noted. She is not diaphoretic. No erythema. No pallor.  Mid abd surgical scar-healed-lower abd reducible incisional hernia-the patient has concerns of cosmetic reason but denied pain.  Left mastectomy Scalp skin lesion above the left ear.  Psychiatric: She has a normal mood and affect. Her behavior is normal. Judgment and thought content normal.  Repetitive.     Labs reviewed:  Recent Labs  05/07/15 10/17/15 01/08/16  NA 136* 138 139  K 4.3 4.8 4.5  BUN 25* 88* 29*  CREATININE 0.7 0.3* 0.6    Recent Labs  05/07/15 10/17/15 01/08/16  AST 18 18 16   ALT 14 10 10   ALKPHOS 108 104 74    Recent Labs  10/17/15 01/08/16 04/13/16  WBC 5.6 7.9 7.2  HGB 17.3* 14.7 19.4*  HCT 52* 45 58*  PLT 347 428* 373   Lab Results  Component Value Date   TSH 1.79 10/17/2015   No results found for: HGBA1C Lab Results  Component Value Date   CHOL 121 11/22/2012   HDL 40 11/22/2012   LDLCALC 65 11/22/2012   TRIG 81 11/22/2012    Significant Diagnostic Results in last 30 days:  No results found.  Assessment/Plan HTN (hypertension) Controlled, continue Clonidine 0.1mg , Carvedilol 12.5mg  bid, Diltiazem 120mg , Losartan 50mg  daily, monitor Bp Update CMP  COPD (chronic obstructive pulmonary disease) (HCC) Stable. Continue Spiriva qd, Ventolin HFA qd, prn DuoNeb for maintenance, Prednisone 5mg  daily.     GERD (gastroesophageal reflux disease) 12/31/15 pharm dc prn Famotidine.  Continue Omeprazole 20mg  daily.  Constipation Controlled. Continue MiraLax bid  Hypothyroidism Continue Levothyroxine 63mcg daily, TSH 1.79 10/17/15, update TSH  Depression Continue Cymbalta 20mg  bid, continue to monitor s/s of depression.   Insomnia Stable, continue Cymbalta.     Edema Minimal, mostly ankles/feet  Abnormal hemoglobin (Hgb) (HCC) 10/17/15 Hgb 17.3, will update Hgb in one week 10/24/15 Hgb 16.9, monitor, may consider hematology.  01/09/16 wbc 7.9, Hgb 14.7, Na 139, K 4.5, Bun 29, creat 0.58 04/13/16 wbc 7.2, Hgb 19.4, plt 373 04/14/16 Hematology referral Update CBC CMP     Family/ staff Communication: SNF  Labs/tests ordered: CBC CMP TSH

## 2016-05-05 NOTE — Assessment & Plan Note (Signed)
Continue Levothyroxine 89mcg daily, TSH 1.79 10/17/15, update TSH

## 2016-05-05 NOTE — Assessment & Plan Note (Signed)
Controlled. Continue MiraLax bid 

## 2016-05-05 NOTE — Assessment & Plan Note (Signed)
Controlled, continue Clonidine 0.1mg , Carvedilol 12.5mg  bid, Diltiazem 120mg , Losartan 50mg  daily, monitor Bp Update CMP

## 2016-05-05 NOTE — Assessment & Plan Note (Signed)
Stable. Continue Spiriva qd, Ventolin HFA qd, prn DuoNeb for maintenance, Prednisone 5mg daily.  

## 2016-05-05 NOTE — Assessment & Plan Note (Signed)
10/17/15 Hgb 17.3, will update Hgb in one week 10/24/15 Hgb 16.9, monitor, may consider hematology.  01/09/16 wbc 7.9, Hgb 14.7, Na 139, K 4.5, Bun 29, creat 0.58 04/13/16 wbc 7.2, Hgb 19.4, plt 373 04/14/16 Hematology referral Update CBC CMP

## 2016-05-05 NOTE — Assessment & Plan Note (Signed)
Stable, continue Cymbalta.  °

## 2016-05-05 NOTE — Assessment & Plan Note (Signed)
Continue Cymbalta 20mg bid, continue to monitor s/s of depression.  

## 2016-05-05 NOTE — Assessment & Plan Note (Signed)
Minimal, mostly ankles/feet 

## 2016-05-05 NOTE — Assessment & Plan Note (Signed)
12/31/15 pharm dc prn Famotidine.  Continue Omeprazole 20mg  daily.

## 2016-05-07 DIAGNOSIS — E039 Hypothyroidism, unspecified: Secondary | ICD-10-CM | POA: Diagnosis not present

## 2016-05-07 DIAGNOSIS — E639 Nutritional deficiency, unspecified: Secondary | ICD-10-CM | POA: Diagnosis not present

## 2016-05-07 DIAGNOSIS — I10 Essential (primary) hypertension: Secondary | ICD-10-CM | POA: Diagnosis not present

## 2016-05-07 LAB — TSH: TSH: 2.3 u[IU]/mL (ref <=5.90)

## 2016-05-07 LAB — BASIC METABOLIC PANEL
BUN: 29 mg/dL — AB (ref 4–21)
Creatinine: 0.8 mg/dL (ref <=1.1)
Glucose: 83 mg/dL
Potassium: 4.6 mmol/L (ref 3.4–5.3)
SODIUM: 141 mmol/L (ref 137–147)

## 2016-05-07 LAB — CBC AND DIFFERENTIAL
HEMATOCRIT: 53 % — AB (ref 36–46)
HEMOGLOBIN: 17.8 g/dL — AB (ref 12.0–16.0)
PLATELETS: 321 10*3/uL (ref 150–399)
WBC: 7.2 10^3/mL

## 2016-05-07 LAB — HEPATIC FUNCTION PANEL
ALK PHOS: 81 U/L (ref 25–125)
ALT: 11 U/L (ref 7–35)
AST: 18 U/L (ref 13–35)
BILIRUBIN, TOTAL: 0.5 mg/dL

## 2016-05-08 ENCOUNTER — Other Ambulatory Visit: Payer: Self-pay | Admitting: *Deleted

## 2016-05-08 ENCOUNTER — Ambulatory Visit (HOSPITAL_BASED_OUTPATIENT_CLINIC_OR_DEPARTMENT_OTHER): Payer: Medicare Other | Admitting: Oncology

## 2016-05-08 ENCOUNTER — Telehealth: Payer: Self-pay | Admitting: Oncology

## 2016-05-08 ENCOUNTER — Encounter: Payer: Self-pay | Admitting: Nurse Practitioner

## 2016-05-08 VITALS — BP 154/90 | HR 81 | Temp 99.0°F | Resp 17 | Ht 62.0 in | Wt 125.5 lb

## 2016-05-08 DIAGNOSIS — R5383 Other fatigue: Secondary | ICD-10-CM

## 2016-05-08 DIAGNOSIS — J449 Chronic obstructive pulmonary disease, unspecified: Secondary | ICD-10-CM

## 2016-05-08 DIAGNOSIS — R627 Adult failure to thrive: Secondary | ICD-10-CM

## 2016-05-08 DIAGNOSIS — D751 Secondary polycythemia: Secondary | ICD-10-CM | POA: Diagnosis not present

## 2016-05-08 DIAGNOSIS — Z87891 Personal history of nicotine dependence: Secondary | ICD-10-CM | POA: Diagnosis not present

## 2016-05-08 NOTE — Progress Notes (Signed)
Reason for Referral: Polycythemia.   HPI: 83 year old woman currently of [place] where she lived the majority of her life. She currently resides at a senior living facility and as done so for a while. She has a significant medical history for COPD, hypertension among other comorbid conditions. She was evaluated by her primary care provider and was noted to have an elevated hemoglobin. Laboratory data from 05/07/2016 showed a hemoglobin of 17.8. She had normal white cell count and platelets. She had normal kidney function and liver function test. Previous hemoglobin on January 2018 was as high as 19.4. Her hemoglobin has fluctuated as high as 17 in 2015 and have declined to normal range on multiple occasions.  She does report history of smoking but she quit many years ago. She has been diagnosed with COPD although does not require oxygen. She is on low-dose prednisone although it is unclear to me why she is on its and how long. She does report symptoms of fatigue and depression but no headaches or blurry vision. She denied any recent hospitalization or illnesses.  She does not report any blurry vision, syncope or seizures. She does not report any fevers, chills or sweats. She does not report any cough, wheezing or hemoptysis. She does not report any chest pain, palpitation, orthopnea or leg edema. She does not report any nausea, vomiting or abdominal pain. She does not report any frequency urgency or hesitancy. She does not report any skeletal complaints of arthralgias or myalgias. Remaining review of systems unremarkable.   Past Medical History:  Diagnosis Date  . Back pain, thoracic 01/12/2014  . Cancer ([place]) 1980   Breast cancer  . Constipation 11/18/2012  . COPD (chronic obstructive pulmonary disease) ([place])   . CVA (cerebral infarction) 06/08/2011   Following abdominal surgery for bowel obstruction   . Depression 12/26/2013  . DVT (deep venous thrombosis) ([place]) 11/18/2012  . Fall 02/07/2013   . GERD (gastroesophageal reflux disease) 03/30/2014  . Granuloma annulare 06/08/2014  . HTN (hypertension) 11/18/2012   01/15/14 Bun/creat 14/0.60   . Hypertension   . Hypothyroidism   . Incisional hernia, without obstruction or gangrene 05/27/2014   Lower abd from previous surgery-2-3 small incisional hernias which are reducible-no incarceration or pain complained.    . Insomnia 11/18/2012  . Left hemiparesis ([place]) 06/08/2014  . Osteoporosis   . Shortness of breath   . Thoracic spine fracture [facility]) 12/18/2013   01/05/14 T 10 Dr. Newman Pies: PCP managing pain.  F/u 2 months(suggested that if the pain is "not too bad" and seems to be improving that she "live with it" and give a change to heal without intervention.  12/18/13 X-ray Thoracic spine: Compression deformity at the level of T10. Comparing back to a chest x-ray from 12/09/2013, this appears new in the interval.       . TIA (transient ischemic attack)   . Ventral hernia 06/08/2014  :  Past Surgical History:  Procedure Laterality Date  . ABDOMINAL HYSTERECTOMY    . CATARACT EXTRACTION, BILATERAL    . EXPLORATORY LAPAROTOMY W/ BOWEL RESECTION     10/19/12-11/01/12 [facility] for exploratory laparotomty with extensive lysis of adhesion and segmental small bowel resection with end to end anastomosis per Dr. Isidore Moos 10/22/12  . MASTECTOMY  1980   left side  . TONSILLECTOMY    . TOTAL KNEE ARTHROPLASTY  2010   left knee  :   Current Outpatient Prescriptions:  .  acetaminophen (TYLENOL) 325 MG tablet, Take 650 mg by  mouth every 4 (four) hours as needed for moderate pain., Disp: , Rfl:  .  albuterol (VENTOLIN HFA) 108 (90 Base) MCG/ACT inhaler, Inhale 2 puffs into the lungs daily. Take 2 puffs Every 6 hours as needed for shortness of breath and wheezing, Disp: , Rfl:  .  antiseptic oral rinse (BIOTENE) LIQD, 15 mLs by Mouth Rinse route as needed for dry mouth., Disp: , Rfl:  .  aspirin 81 MG chewable tablet, Chew 81 mg by mouth every  morning., Disp: , Rfl:  .  carvedilol (COREG) 12.5 MG tablet, Take 12.5 mg by mouth 2 (two) times daily with a meal., Disp: , Rfl:  .  cloNIDine (CATAPRES) 0.1 MG tablet, Take 0.1 mg by mouth every morning. , Disp: , Rfl:  .  Dextromethorphan-Guaifenesin (TUSSIN DM) 10-100 MG/5ML liquid, Take 5 mLs by mouth every 12 (twelve) hours., Disp: , Rfl:  .  diltiazem (CARDIZEM CD) 120 MG 24 hr capsule, Take 120 mg by mouth daily. , Disp: , Rfl:  .  DULoxetine (CYMBALTA) 20 MG capsule, Take 20 mg by mouth. Take one tablet twice daily, Disp: , Rfl:  .  feeding supplement (BOOST / RESOURCE BREEZE) LIQD, Take 1 Container by mouth daily as needed. , Disp: , Rfl:  .  Glycerin, Laxative, (PEDIA-LAX) 2.8 g SUPP, Place rectally as needed (for constipation)., Disp: , Rfl:  .  ipratropium-albuterol (DUONEB) 0.5-2.5 (3) MG/3ML SOLN, Take 3 mLs by nebulization every 6 (six) hours as needed. For shortness of breath and wheezing., Disp: , Rfl:  .  levothyroxine (SYNTHROID, LEVOTHROID) 50 MCG tablet, Take 50 mcg by mouth every morning. , Disp: , Rfl:  .  Lidocaine (ASPERCREME LIDOCAINE) 4 % PTCH, Apply 1 patch topically daily., Disp: , Rfl:  .  losartan (COZAAR) 50 MG tablet, Take 50 mg by mouth daily., Disp: , Rfl:  .  Melatonin 3 MG TBDP, Take 3 mg by mouth at bedtime., Disp: , Rfl:  .  Multiple Vitamin (MULTIVITAMIN WITH MINERALS) TABS tablet, Take 1 tablet by mouth every morning., Disp: , Rfl:  .  omeprazole (PRILOSEC) 20 MG capsule, Take 20 mg by mouth. Take one tablet daily, Disp: , Rfl:  .  polyethylene glycol (MIRALAX / GLYCOLAX) packet, Take 17 g by mouth daily as needed. , Disp: , Rfl:  .  polyethylene glycol powder (GLYCOLAX/MIRALAX) powder, , Disp: , Rfl:  .  polyvinyl alcohol (LIQUIFILM TEARS) 1.4 % ophthalmic solution, Place 1 drop into both eyes 4 (four) times daily as needed. , Disp: , Rfl:  .  predniSONE (DELTASONE) 5 MG tablet, Take 5 mg by mouth daily with breakfast., Disp: , Rfl:  .  promethazine  (PHENERGAN) 12.5 MG suppository, , Disp: , Rfl:  .  tiotropium (SPIRIVA) 18 MCG inhalation capsule, Place 18 mcg into inhaler and inhale daily., Disp: , Rfl: :  Allergies  Allergen Reactions  . Cortisone   :  Family History  Problem Relation Age of Onset  . High blood pressure Mother   . Osteoporosis Mother   . Heart attack Father     d/o 28  :  Social History   Social History  . Marital status: Widowed    Spouse name: N/A  . Number of children: N/A  . Years of education: N/A   Occupational History  . Not on file.   Social History Main Topics  . Smoking status: Former Smoker    Packs/day: 1.00    Years: 45.00    Quit date: 01/26/1986  .  Smokeless tobacco: Never Used  . Alcohol use No     Comment: previously  . Drug use: No  . Sexual activity: No   Other Topics Concern  . Not on file   Social History Narrative   [facility] [place], using a walker prior to fall, bedridden.  PT/OT before the fall.     Moved to SNF 12/20/13   Widowed   Former smoker -stopped 1987   Alcohol none    DNR, MOST, POA, Living Will  :  Pertinent items are noted in HPI.  Exam: Blood pressure (!) 154/90, pulse 81, temperature 99 F (37.2 C), temperature source Oral, resp. rate 17, height 5\' 2"  (1.575 m), weight 125 lb 8 oz (56.9 kg), SpO2 95 %. General appearance: alert and cooperative appeared without distress. Head: Normocephalic, without obvious abnormality Throat: lips, mucosa, and tongue normal; teeth and gums normal Neck: no adenopathy Back: negative Resp: clear to auscultation bilaterally Chest wall: no tenderness Cardio: regular rate and rhythm, S1, S2 normal, no murmur, click, rub or gallop GI: soft, non-tender; bowel sounds normal; no masses,  no organomegaly Extremities: extremities normal, atraumatic, no cyanosis or edema   Recent Labs  05/07/16  WBC 7.2  HGB 17.8*  HCT 53*  PLT 321    Recent Labs  05/07/16  NA 141  K 4.6  BUN 29*  CREATININE 0.8     Assessment and Plan:   83 year old woman with the following issues:  1. Polycythemia with a hemoglobin of fluctuated as high as 19 and the normal range for the last 3 years. She had normal white cell count and normal platelet count. The differential diagnosis was discussed with the patient and her daughter. She likely has secondary polycythemia rather than a myeloproliferative disorder. Causes of her polycythemia could be related to medication versus COPD.  Polycythemia vera is also consideration although fluctuation in her hemoglobin does not support this finding.  Her management standpoint, I have recommended continued observation and surveillance and repeat CBC in 6 months. I will also obtain JAK2 mutation to rule out polycythemia vera. If she does indeed have this condition, therapeutic phlebotomy would be recommended at that time.  2. Symptoms of fatigue and failure to thrive: I do not think this is related to a hematological disorder. Scattered be related to other factors such as depression among others. This will be followed by her prior care provider.  3. Follow-up: Will be in 6 months to repeat your laboratory testing.

## 2016-05-08 NOTE — Telephone Encounter (Signed)
Appointments scheduled per 2/16 LOS. Patient given AVS report and calendars with future scheduled appointments. °

## 2016-06-02 ENCOUNTER — Encounter: Payer: Self-pay | Admitting: Nurse Practitioner

## 2016-06-02 ENCOUNTER — Non-Acute Institutional Stay (SKILLED_NURSING_FACILITY): Payer: Medicare Other | Admitting: Nurse Practitioner

## 2016-06-02 DIAGNOSIS — J410 Simple chronic bronchitis: Secondary | ICD-10-CM

## 2016-06-02 DIAGNOSIS — K219 Gastro-esophageal reflux disease without esophagitis: Secondary | ICD-10-CM

## 2016-06-02 DIAGNOSIS — K5900 Constipation, unspecified: Secondary | ICD-10-CM

## 2016-06-02 DIAGNOSIS — I10 Essential (primary) hypertension: Secondary | ICD-10-CM

## 2016-06-02 DIAGNOSIS — D751 Secondary polycythemia: Secondary | ICD-10-CM

## 2016-06-02 DIAGNOSIS — M5440 Lumbago with sciatica, unspecified side: Secondary | ICD-10-CM | POA: Diagnosis not present

## 2016-06-02 DIAGNOSIS — E039 Hypothyroidism, unspecified: Secondary | ICD-10-CM | POA: Diagnosis not present

## 2016-06-02 DIAGNOSIS — R609 Edema, unspecified: Secondary | ICD-10-CM | POA: Diagnosis not present

## 2016-06-02 DIAGNOSIS — F5101 Primary insomnia: Secondary | ICD-10-CM

## 2016-06-02 DIAGNOSIS — F320 Major depressive disorder, single episode, mild: Secondary | ICD-10-CM | POA: Diagnosis not present

## 2016-06-02 NOTE — Assessment & Plan Note (Signed)
Stable. Continue Spiriva qd, Ventolin HFA qd, prn DuoNeb for maintenance, Prednisone 5mg  daily.

## 2016-06-02 NOTE — Assessment & Plan Note (Signed)
05/08/16 Oncology: could be related to medication vs COPD, pending JAK2 mutation to r/o polycythemia vera.

## 2016-06-02 NOTE — Assessment & Plan Note (Signed)
Stable. Continue Omeprazole 20mg daily. 

## 2016-06-02 NOTE — Assessment & Plan Note (Signed)
Minimal, mostly ankles/feet

## 2016-06-02 NOTE — Assessment & Plan Note (Signed)
Pain is better controlled, continue Tylenol prn, Cymbalta 20mg 

## 2016-06-02 NOTE — Progress Notes (Signed)
Location:  [facility] Room Number: 10 Place of Service:  SNF (31) Provider:  Mast, Manxie   NP  Jeanmarie Hubert, MD  Patient Care Team: Estill Dooms, MD as PCP - General (Internal Medicine) Gaynelle Arabian, MD as Consulting Physician (Orthopedic Surgery) Man Otho Darner, NP as Nurse Practitioner (Nurse Practitioner) [place] (Skilled Nursing and [place])  Extended Emergency Contact Information Primary Emergency Contact: Johnston,Betsy Address: [place], [place] Phone: [phone] Relation: Daughter Secondary Emergency Contact: Rey,Lindsey  [place] Phone: [phone] Relation: Daughter  Code Status:  DNR Goals of care: Advanced Directive information Advanced Directives 06/02/2016  Does Patient Have a Medical Advance Directive? Yes  Type of Paramedic of [place];Living will;Out of facility DNR (pink MOST or yellow form)  Does patient want to make changes to medical advance directive? No - Patient declined  Copy of [place] in Chart? Yes  Pre-existing out of facility DNR order (yellow form or pink MOST form) Yellow form placed in chart (order not valid for inpatient use)     Chief Complaint  Patient presents with  . Medical Management of Chronic Issues    HPI:  Pt is a 83 y.o. female seen today for medical management of chronic diseases.     Hx of COPD, chronic cough, on Spiriva daily, Ventolin HFA daily, DuoNeb prn, Prednisone 5mg  qd, Hx of depression and back pain, better managed with Cymbalta 20mg  bid, blood pressure is well controlled, takes Losartan 50mg  and Diltiazem 120mg , Clonidine 0.1mg  qam, Carvedilol 12.5mg  bid, Hypothyroidism, taking Levothyroxine 65mcg, last TSH 1.79 10/17/15 Past Medical History:  Diagnosis Date  . Back pain, thoracic 01/12/2014  . Cancer ([place]) 1980   Breast cancer  . Constipation  11/18/2012  . COPD (chronic obstructive pulmonary disease) ([place])   . CVA (cerebral infarction) 06/08/2011   Following abdominal surgery for bowel obstruction   . Depression 12/26/2013  . DVT (deep venous thrombosis) ([place]) 11/18/2012  . Fall 02/07/2013  . GERD (gastroesophageal reflux disease) 03/30/2014  . Granuloma annulare 06/08/2014  . HTN (hypertension) 11/18/2012   01/15/14 Bun/creat 14/0.60   . Hypertension   . Hypothyroidism   . Incisional hernia, without obstruction or gangrene 05/27/2014   Lower abd from previous surgery-2-3 small incisional hernias which are reducible-no incarceration or pain complained.    . Insomnia 11/18/2012  . Left hemiparesis ([place]) 06/08/2014  . Osteoporosis   . Shortness of breath   . Thoracic spine fracture [facility]) 12/18/2013   01/05/14 T 10 Dr. Newman Pies: PCP managing pain.  F/u 2 months(suggested that if the pain is "not too bad" and seems to be improving that she "live with it" and give a change to heal without intervention.  12/18/13 X-ray Thoracic spine: Compression deformity at the level of T10. Comparing back to a chest x-ray from 12/09/2013, this appears new in the interval.       . TIA (transient ischemic attack)   . Ventral hernia 06/08/2014   Past Surgical History:  Procedure Laterality Date  . ABDOMINAL HYSTERECTOMY    . CATARACT EXTRACTION, BILATERAL    . EXPLORATORY LAPAROTOMY W/ BOWEL RESECTION     10/19/12-11/01/12 [facility] for exploratory laparotomty with extensive lysis of adhesion and segmental small bowel resection with end to end anastomosis per Dr. Isidore Moos 10/22/12  . MASTECTOMY  1980  left side  . TONSILLECTOMY    . TOTAL KNEE ARTHROPLASTY  2010   left knee    Allergies  Allergen Reactions  . Cortisone     Allergies as of 06/02/2016      Reactions   Cortisone       Medication List       Accurate as of 06/02/16  4:01 PM. Always use your most recent med list.          acetaminophen 325 MG tablet Commonly known as:   TYLENOL Take 650 mg by mouth every 4 (four) hours as needed for moderate pain.   antiseptic oral rinse Liqd 15 mLs by Mouth Rinse route as needed for dry mouth.   ASPERCREME LIDOCAINE 4 % Ptch Generic drug:  Lidocaine Apply 1 patch topically daily.   aspirin 81 MG chewable tablet Chew 81 mg by mouth every morning.   carvedilol 12.5 MG tablet Commonly known as:  COREG Take 12.5 mg by mouth 2 (two) times daily with a meal.   cloNIDine 0.1 MG tablet Commonly known as:  CATAPRES Take 0.1 mg by mouth every morning.   diltiazem 120 MG 24 hr capsule Commonly known as:  CARDIZEM CD Take 120 mg by mouth daily.   DULoxetine 20 MG capsule Commonly known as:  CYMBALTA Take 20 mg by mouth. Take one tablet twice daily   feeding supplement Liqd Take 1 Container by mouth daily as needed.   ipratropium-albuterol 0.5-2.5 (3) MG/3ML Soln Commonly known as:  DUONEB Take 3 mLs by nebulization every 6 (six) hours as needed. For shortness of breath and wheezing.   levothyroxine 50 MCG tablet Commonly known as:  SYNTHROID, LEVOTHROID Take 50 mcg by mouth every morning.   losartan 50 MG tablet Commonly known as:  COZAAR Take 50 mg by mouth daily.   Melatonin 3 MG Tbdp Take 3 mg by mouth at bedtime.   multivitamin with minerals Tabs tablet Take 1 tablet by mouth every morning.   omeprazole 20 MG capsule Commonly known as:  PRILOSEC Take 20 mg by mouth. Take one tablet daily   PEDIA-LAX 2.8 g Supp Generic drug:  Glycerin (Laxative) Place rectally as needed (for constipation).   polyethylene glycol packet Commonly known as:  MIRALAX / GLYCOLAX Take 17 g by mouth daily as needed.   polyvinyl alcohol 1.4 % ophthalmic solution Commonly known as:  LIQUIFILM TEARS Place 1 drop into both eyes 4 (four) times daily as needed.   predniSONE 5 MG tablet Commonly known as:  DELTASONE Take 5 mg by mouth daily with breakfast.   tiotropium 18 MCG inhalation capsule Commonly known as:   SPIRIVA Place 18 mcg into inhaler and inhale daily.   TUSSIN DM 10-100 MG/5ML liquid Generic drug:  Dextromethorphan-Guaifenesin Take 5 mLs by mouth every 12 (twelve) hours.   VENTOLIN HFA 108 (90 Base) MCG/ACT inhaler Generic drug:  albuterol Inhale 2 puffs into the lungs daily. Take 2 puffs Every 6 hours as needed for shortness of breath and wheezing       Review of Systems  Constitutional: Positive for fatigue. Negative for chills, diaphoresis and fever.       Generalized weakness.   HENT: Positive for hearing loss. Negative for congestion, ear discharge and ear pain.   Eyes: Negative for pain, discharge and redness.  Respiratory: Positive for cough. Negative for shortness of breath, wheezing and stridor.        Chronic hacking cough occasionally  Cardiovascular: Positive for leg swelling. Negative  for chest pain and palpitations.       Trace in ankles.   Gastrointestinal: Positive for blood in stool (none recent; history of blood in stool)). Negative for abdominal pain, constipation, diarrhea, nausea and vomiting.       Lower abd incisional hernia. C/o heartburn   Genitourinary: Positive for frequency. Negative for dysuria, flank pain, hematuria and urgency.       Q2-3 hours average.   Musculoskeletal: Positive for back pain. Negative for myalgias and neck pain.       Improved back pain. She is able to ambulates with walker on unit.  Resolution of Left hip pain since 11/25/14.  Skin: Negative for rash.       Lower abd incisional hernia-reducible.  Neurological: Positive for weakness. Negative for dizziness, tremors, seizures and headaches.  Psychiatric/Behavioral: Negative for hallucinations and suicidal ideas. The patient is not nervous/anxious.        C/o sleeps too much.    Immunization History  Administered Date(s) Administered  . Influenza,inj,Quad PF,36+ Mos 12/19/2013  . Influenza-Unspecified 01/21/2013, 12/28/2014, 01/03/2016  . PPD Test 01/02/2014  . Pneumococcal  Polysaccharide-23 11/24/1999  . Td 12/28/1994  . Tdap 04/08/2013   Pertinent  Health Maintenance Due  Topic Date Due  . DEXA SCAN  05/24/1990  . PNA vac Low Risk Adult (2 of 2 - PCV13) 11/23/2000  . INFLUENZA VACCINE  Completed   Fall Risk  12/03/2014 06/08/2014  Falls in the past year? Yes No  Number falls in past yr: 1 -  Injury with Fall? No -  Risk for fall due to : History of fall(s);Impaired balance/gait;Impaired mobility Impaired balance/gait  Follow up Falls evaluation completed -   Functional Status Survey:    Vitals:   06/02/16 1246  BP: 113/71  Pulse: 75  Resp: 15  Temp: 97.3 F (36.3 C)  SpO2: 94%  Weight: 123 lb (55.8 kg)  Height: 5\' 2"  (1.575 m)   Body mass index is 22.5 kg/m. Physical Exam  Constitutional: She is oriented to person, place, and time. She appears well-developed and well-nourished. No distress.  HENT:  Head: Normocephalic and atraumatic.  Right Ear: External ear normal.  Left Ear: External ear normal.  Nose: Nose normal.  Mouth/Throat: Oropharynx is clear and moist. No oropharyngeal exudate.  Eyes: Conjunctivae and EOM are normal. Pupils are equal, round, and reactive to light. Right eye exhibits no discharge. Left eye exhibits no discharge. No scleral icterus.  Neck: Normal range of motion. Neck supple. No JVD present. No tracheal deviation present. No thyromegaly present.  Cardiovascular: Normal rate, regular rhythm, normal heart sounds and intact distal pulses.   No murmur heard. Pulmonary/Chest: Effort normal. No stridor. No respiratory distress. She has decreased breath sounds. She has no wheezes. She has rales. She exhibits no tenderness.  Bibasilar, posterior coarse expiratory rhonchi.   Abdominal: Soft. She exhibits no distension and no mass. There is no tenderness.  Genitourinary: Rectal exam shows guaiac positive stool.  Musculoskeletal: Normal range of motion. She exhibits edema and tenderness.  Ankles and feet have trace edema.  Mid back pain.  Unstable gait. Uses rolling walker.  Neurological: She is alert and oriented to person, place, and time. No cranial nerve deficit. She exhibits normal muscle tone. Coordination normal.  Left hemiparesis  Skin: Skin is warm and dry. No rash noted. She is not diaphoretic. No erythema. No pallor.  Mid abd surgical scar-healed-lower abd reducible incisional hernia-the patient has concerns of cosmetic reason but denied pain.  Left mastectomy Scalp skin lesion above the left ear.   Psychiatric: She has a normal mood and affect. Her behavior is normal. Judgment and thought content normal.  Repetitive.     Labs reviewed:  Recent Labs  10/17/15 01/08/16 05/07/16  NA 138 139 141  K 4.8 4.5 4.6  BUN 88* 29* 29*  CREATININE 0.3* 0.6 0.8    Recent Labs  10/17/15 01/08/16 05/07/16  AST 18 16 18   ALT 10 10 11   ALKPHOS 104 74 81    Recent Labs  01/08/16 04/13/16 05/07/16  WBC 7.9 7.2 7.2  HGB 14.7 19.4* 17.8*  HCT 45 58* 53*  PLT 428* 373 321   Lab Results  Component Value Date   TSH 2.30 05/07/2016   No results found for: HGBA1C Lab Results  Component Value Date   CHOL 121 11/22/2012   HDL 40 11/22/2012   LDLCALC 65 11/22/2012   TRIG 81 11/22/2012    Significant Diagnostic Results in last 30 days:  No results found.  Assessment/Plan HTN (hypertension) Controlled, continue Clonidine 0.1mg , Carvedilol 12.5mg  bid, Diltiazem 120mg , Losartan 50mg  daily, 05/07/16 wbc 7.2, Hgb 17.8, plt 321, Na 141, K 4.6, Bun 29, creat 0.78, TSH 2.3  COPD (chronic obstructive pulmonary disease) (HCC) Stable. Continue Spiriva qd, Ventolin HFA qd, prn DuoNeb for maintenance, Prednisone 5mg  daily.     GERD (gastroesophageal reflux disease) Stable. Continue Omeprazole 20mg  daily.  Constipation Controlled. Continue MiraLax bid  Hypothyroidism Continue Levothyroxine 35mcg daily, 05/07/16 TSH 2.3  Depression Continue Cymbalta 20mg  bid, continue to monitor s/s of depression.     Lower back pain Pain is better controlled, continue Tylenol prn, Cymbalta 20mg    Insomnia Stable, continue Cymbalta.   Edema Minimal, mostly ankles/feet  Polycythemia 05/08/16 Oncology: could be related to medication vs COPD, pending JAK2 mutation to r/o polycythemia vera.       Family/ staff Communication: SNF  Labs/tests ordered:  none

## 2016-06-02 NOTE — Assessment & Plan Note (Signed)
Controlled. Continue MiraLax bid

## 2016-06-02 NOTE — Assessment & Plan Note (Signed)
Continue Levothyroxine 53mcg daily, 05/07/16 TSH 2.3

## 2016-06-02 NOTE — Assessment & Plan Note (Addendum)
Controlled, continue Clonidine 0.1mg , Carvedilol 12.5mg  bid, Diltiazem 120mg , Losartan 50mg  daily, 05/07/16 wbc 7.2, Hgb 17.8, plt 321, Na 141, K 4.6, Bun 29, creat 0.78, TSH 2.3

## 2016-06-02 NOTE — Assessment & Plan Note (Signed)
Stable, continue Cymbalta.  °

## 2016-06-02 NOTE — Assessment & Plan Note (Signed)
Continue Cymbalta 20mg  bid, continue to monitor s/s of depression.

## 2016-06-10 DIAGNOSIS — R531 Weakness: Secondary | ICD-10-CM | POA: Diagnosis not present

## 2016-07-03 ENCOUNTER — Non-Acute Institutional Stay (SKILLED_NURSING_FACILITY): Payer: Medicare Other | Admitting: Nurse Practitioner

## 2016-07-03 ENCOUNTER — Encounter: Payer: Self-pay | Admitting: Nurse Practitioner

## 2016-07-03 DIAGNOSIS — D751 Secondary polycythemia: Secondary | ICD-10-CM | POA: Diagnosis not present

## 2016-07-03 DIAGNOSIS — E039 Hypothyroidism, unspecified: Secondary | ICD-10-CM | POA: Diagnosis not present

## 2016-07-03 DIAGNOSIS — S22000A Wedge compression fracture of unspecified thoracic vertebra, initial encounter for closed fracture: Secondary | ICD-10-CM

## 2016-07-03 DIAGNOSIS — F320 Major depressive disorder, single episode, mild: Secondary | ICD-10-CM

## 2016-07-03 DIAGNOSIS — J410 Simple chronic bronchitis: Secondary | ICD-10-CM | POA: Diagnosis not present

## 2016-07-03 DIAGNOSIS — F5101 Primary insomnia: Secondary | ICD-10-CM

## 2016-07-03 DIAGNOSIS — K5900 Constipation, unspecified: Secondary | ICD-10-CM | POA: Diagnosis not present

## 2016-07-03 DIAGNOSIS — R609 Edema, unspecified: Secondary | ICD-10-CM

## 2016-07-03 DIAGNOSIS — I10 Essential (primary) hypertension: Secondary | ICD-10-CM

## 2016-07-03 DIAGNOSIS — K219 Gastro-esophageal reflux disease without esophagitis: Secondary | ICD-10-CM | POA: Diagnosis not present

## 2016-07-03 NOTE — Assessment & Plan Note (Signed)
Stable, continue Cymbalta. Off Ambien

## 2016-07-03 NOTE — Assessment & Plan Note (Signed)
Controlled. Continue MiraLax bid

## 2016-07-03 NOTE — Assessment & Plan Note (Signed)
Minimal, mostly ankles/feet

## 2016-07-03 NOTE — Assessment & Plan Note (Signed)
05/08/16 Oncology: could be related to medication vs COPD, pending JAK2 mutation to r/o polycythemia vera.

## 2016-07-03 NOTE — Progress Notes (Signed)
Location:  [facility] Room Number: 10 Place of Service:  SNF (31) Provider:  Cherish Runde, Manxie  NP  Jeanmarie Hubert, MD  Patient Care Team: Estill Dooms, MD as PCP - General (Internal Medicine) Gaynelle Arabian, MD as Consulting Physician (Orthopedic Surgery) Saraiya Kozma Otho Darner, NP as Nurse Practitioner (Nurse Practitioner) [facility] and [place])  Extended Emergency Contact Information Primary Emergency Contact: Johnston,Betsy Address: [place], [place] Phone: [phone] Relation: Daughter Secondary Emergency Contact: Rey,Lindsey  [place] Phone: [phone] Relation: Daughter  Code Status:  DNR Goals of care: Advanced Directive information Advanced Directives 07/03/2016  Does Patient Have a Medical Advance Directive? Yes  Type of Paramedic of [place];Living will;Out of facility DNR (pink MOST or yellow form)  Does patient want to make changes to medical advance directive? No - Patient declined  Copy of [place] in Chart? Yes  Pre-existing out of facility DNR order (yellow form or pink MOST form) Yellow form placed in chart (order not valid for inpatient use)     Chief Complaint  Patient presents with  . Medical Management of Chronic Issues    HPI:  Pt is a 83 y.o. female seen today for medical management of chronic diseases.      Hx of COPD, chronic cough, on Spiriva daily, Ventolin HFAdaily, DuoNeb prn, Prednisone 5mg  qd, Hx of depression and back pain, better managed with Cymbalta 20mg  bid, blood pressure is well controlled, takes Losartan 50mg  and Diltiazem 120mg , Clonidine 0.1mg  qam,Carvedilol 12.5mg  bid, Hypothyroidism, taking Levothyroxine 74mcg, last TSH 2.3 05/07/16  Past Medical History:  Diagnosis Date  . Back pain, thoracic 01/12/2014  . Cancer ([place]) 1980   Breast cancer  .  Constipation 11/18/2012  . COPD (chronic obstructive pulmonary disease) ([place])   . CVA (cerebral infarction) 06/08/2011   Following abdominal surgery for bowel obstruction   . Depression 12/26/2013  . DVT (deep venous thrombosis) ([place]) 11/18/2012  . Fall 02/07/2013  . GERD (gastroesophageal reflux disease) 03/30/2014  . Granuloma annulare 06/08/2014  . HTN (hypertension) 11/18/2012   01/15/14 Bun/creat 14/0.60   . Hypertension   . Hypothyroidism   . Incisional hernia, without obstruction or gangrene 05/27/2014   Lower abd from previous surgery-2-3 small incisional hernias which are reducible-no incarceration or pain complained.    . Insomnia 11/18/2012  . Left hemiparesis ([place]) 06/08/2014  . Osteoporosis   . Shortness of breath   . Thoracic spine fracture [facility]) 12/18/2013   01/05/14 T 10 Dr. Newman Pies: PCP managing pain.  F/u 2 months(suggested that if the pain is "not too bad" and seems to be improving that she "live with it" and give a change to heal without intervention.  12/18/13 X-ray Thoracic spine: Compression deformity at the level of T10. Comparing back to a chest x-ray from 12/09/2013, this appears new in the interval.       . TIA (transient ischemic attack)   . Ventral hernia 06/08/2014   Past Surgical History:  Procedure Laterality Date  . ABDOMINAL HYSTERECTOMY    . CATARACT EXTRACTION, BILATERAL    . EXPLORATORY LAPAROTOMY W/ BOWEL RESECTION     10/19/12-11/01/12 [facility] for exploratory laparotomty with extensive lysis of adhesion and segmental small bowel resection with end to end anastomosis per Dr. Isidore Moos 10/22/12  . MASTECTOMY  1980   left  side  . TONSILLECTOMY    . TOTAL KNEE ARTHROPLASTY  2010   left knee    Allergies  Allergen Reactions  . Cortisone     Outpatient Encounter Prescriptions as of 07/03/2016  Medication Sig  . acetaminophen (TYLENOL) 325 MG tablet Take 650 mg by mouth every 4 (four) hours as needed for moderate pain.  Marland Kitchen albuterol (VENTOLIN HFA)  108 (90 Base) MCG/ACT inhaler Inhale 2 puffs into the lungs daily. Take 2 puffs Every 6 hours as needed for shortness of breath and wheezing  . antiseptic oral rinse (BIOTENE) LIQD 15 mLs by Mouth Rinse route as needed for dry mouth.  Marland Kitchen aspirin 81 MG chewable tablet Chew 81 mg by mouth every morning.  . carvedilol (COREG) 12.5 MG tablet Take 12.5 mg by mouth 2 (two) times daily with a meal.  . cloNIDine (CATAPRES) 0.1 MG tablet Take 0.1 mg by mouth every morning.   Marland Kitchen Dextromethorphan-Guaifenesin (TUSSIN DM) 10-100 MG/5ML liquid Take 5 mLs by mouth every 12 (twelve) hours.  Marland Kitchen diltiazem (CARDIZEM CD) 120 MG 24 hr capsule Take 120 mg by mouth daily.   . DULoxetine (CYMBALTA) 20 MG capsule Take 20 mg by mouth. Take one tablet twice daily  . feeding supplement (BOOST / RESOURCE BREEZE) LIQD Take 1 Container by mouth daily as needed.   . Glycerin, Laxative, (PEDIA-LAX) 2.8 g SUPP Place rectally as needed (for constipation).  Marland Kitchen ipratropium-albuterol (DUONEB) 0.5-2.5 (3) MG/3ML SOLN Take 3 mLs by nebulization every 6 (six) hours as needed. For shortness of breath and wheezing.  Marland Kitchen levothyroxine (SYNTHROID, LEVOTHROID) 50 MCG tablet Take 50 mcg by mouth every morning.   . Lidocaine (ASPERCREME LIDOCAINE) 4 % PTCH Apply 1 patch topically daily.  Marland Kitchen losartan (COZAAR) 50 MG tablet Take 50 mg by mouth daily.  . Melatonin 3 MG TBDP Take 3 mg by mouth at bedtime.  . Multiple Vitamin (MULTIVITAMIN WITH MINERALS) TABS tablet Take 1 tablet by mouth every morning.  Marland Kitchen omeprazole (PRILOSEC) 20 MG capsule Take 20 mg by mouth. Take one tablet daily  . polyethylene glycol (MIRALAX / GLYCOLAX) packet Take 17 g by mouth daily as needed.   . polyvinyl alcohol (LIQUIFILM TEARS) 1.4 % ophthalmic solution Place 1 drop into both eyes 4 (four) times daily as needed.   . predniSONE (DELTASONE) 5 MG tablet Take 5 mg by mouth daily with breakfast.  . tiotropium (SPIRIVA) 18 MCG inhalation capsule Place 18 mcg into inhaler and inhale  daily.   No facility-administered encounter medications on file as of 07/03/2016.     Review of Systems  Constitutional: Positive for fatigue. Negative for chills, diaphoresis and fever.       Generalized weakness.   HENT: Positive for hearing loss. Negative for congestion, ear discharge and ear pain.   Eyes: Negative for pain, discharge and redness.  Respiratory: Positive for cough. Negative for shortness of breath, wheezing and stridor.        Chronic hacking cough occasionally  Cardiovascular: Positive for leg swelling. Negative for chest pain and palpitations.       Trace in ankles.   Gastrointestinal: Positive for blood in stool (none recent; history of blood in stool)). Negative for abdominal pain, constipation, diarrhea, nausea and vomiting.       Lower abd incisional hernia. C/o heartburn   Genitourinary: Positive for frequency. Negative for dysuria, flank pain, hematuria and urgency.       Q2-3 hours average.   Musculoskeletal: Positive for back pain. Negative for  myalgias and neck pain.       Improved back pain. She is able to ambulates with walker on unit.  Resolution of Left hip pain since 11/25/14.  Skin: Negative for rash.       Lower abd incisional hernia-reducible.  Neurological: Positive for weakness. Negative for dizziness, tremors, seizures and headaches.  Psychiatric/Behavioral: Negative for hallucinations and suicidal ideas. The patient is not nervous/anxious.        C/o sleeps too much.    Immunization History  Administered Date(s) Administered  . Influenza,inj,Quad PF,36+ Mos 12/19/2013  . Influenza-Unspecified 01/21/2013, 12/28/2014, 01/03/2016  . PPD Test 01/02/2014  . Pneumococcal Polysaccharide-23 11/24/1999  . Td 12/28/1994  . Tdap 04/08/2013   Pertinent  Health Maintenance Due  Topic Date Due  . DEXA SCAN  05/24/1990  . PNA vac Low Risk Adult (2 of 2 - PCV13) 11/23/2000  . INFLUENZA VACCINE  10/21/2016   Fall Risk  12/03/2014 06/08/2014  Falls in  the past year? Yes No  Number falls in past yr: 1 -  Injury with Fall? No -  Risk for fall due to : History of fall(s);Impaired balance/gait;Impaired mobility Impaired balance/gait  Follow up Falls evaluation completed -   Functional Status Survey:    Vitals:   07/03/16 0954  BP: 140/60  Pulse: 77  Temp: 97.8 F (36.6 C)  SpO2: 98%   There is no height or weight on file to calculate BMI. Physical Exam  Constitutional: She is oriented to person, place, and time. She appears well-developed and well-nourished. No distress.  HENT:  Head: Normocephalic and atraumatic.  Right Ear: External ear normal.  Left Ear: External ear normal.  Nose: Nose normal.  Mouth/Throat: Oropharynx is clear and moist. No oropharyngeal exudate.  Eyes: Conjunctivae and EOM are normal. Pupils are equal, round, and reactive to light. Right eye exhibits no discharge. Left eye exhibits no discharge. No scleral icterus.  Neck: Normal range of motion. Neck supple. No JVD present. No tracheal deviation present. No thyromegaly present.  Cardiovascular: Normal rate, regular rhythm, normal heart sounds and intact distal pulses.   No murmur heard. Pulmonary/Chest: Effort normal. No stridor. No respiratory distress. She has decreased breath sounds. She has no wheezes. She has rales. She exhibits no tenderness.  Bibasilar, posterior coarse expiratory rhonchi.   Abdominal: Soft. She exhibits no distension and no mass. There is no tenderness.  Genitourinary: Rectal exam shows guaiac positive stool.  Musculoskeletal: Normal range of motion. She exhibits edema and tenderness.  Ankles and feet have trace edema. Mid back pain.  Unstable gait. Uses rolling walker.  Neurological: She is alert and oriented to person, place, and time. No cranial nerve deficit. She exhibits normal muscle tone. Coordination normal.  Left hemiparesis  Skin: Skin is warm and dry. No rash noted. She is not diaphoretic. No erythema. No pallor.  Mid  abd surgical scar-healed-lower abd reducible incisional hernia-the patient has concerns of cosmetic reason but denied pain.  Left mastectomy Scalp skin lesion above the left ear.   Psychiatric: She has a normal mood and affect. Her behavior is normal. Judgment and thought content normal.  Repetitive.     Labs reviewed:  Recent Labs  10/17/15 01/08/16 05/07/16  NA 138 139 141  K 4.8 4.5 4.6  BUN 88* 29* 29*  CREATININE 0.3* 0.6 0.8    Recent Labs  10/17/15 01/08/16 05/07/16  AST 18 16 18   ALT 10 10 11   ALKPHOS 104 74 81    Recent Labs  01/08/16 04/13/16 05/07/16  WBC 7.9 7.2 7.2  HGB 14.7 19.4* 17.8*  HCT 45 58* 53*  PLT 428* 373 321   Lab Results  Component Value Date   TSH 2.30 05/07/2016   No results found for: HGBA1C Lab Results  Component Value Date   CHOL 121 11/22/2012   HDL 40 11/22/2012   LDLCALC 65 11/22/2012   TRIG 81 11/22/2012    Significant Diagnostic Results in last 30 days:  No results found.  Assessment/Plan Hypothyroidism Stable, continue Levothyroxine 66mcg, last TSH 2.3 05/07/16  HTN (hypertension) Controlled, continue Clonidine 0.1mg , Carvedilol 12.5mg  bid, Diltiazem 120mg , Losartan 50mg  daily, 05/07/16 wbc 7.2, Hgb 17.8, plt 321, Na 141, K 4.6, Bun 29, creat 0.78, TSH 2.3  COPD (chronic obstructive pulmonary disease) (HCC) Stable. Continue Spiriva qd, Ventolin HFA qd, prn DuoNeb for maintenance, Prednisone 5mg  daily.   GERD (gastroesophageal reflux disease) Stable. Continue Omeprazole 20mg  daily.  Constipation Controlled. Continue MiraLax bid  Thoracic spine fracture Pain is better controlled, continue Tylenol prn, Cymbalta 20mg   Depression Continue Cymbalta 20mg  bid, continue to monitor s/s of depression.   Insomnia Stable, continue Cymbalta. Off Ambien  Edema Minimal, mostly ankles/feet  Polycythemia 05/08/16 Oncology: could be related to medication vs COPD, pending JAK2 mutation to r/o polycythemia vera.         Family/ staff Communication: SNF  Labs/tests ordered:  none

## 2016-07-03 NOTE — Assessment & Plan Note (Signed)
Controlled, continue Clonidine 0.1mg , Carvedilol 12.5mg  bid, Diltiazem 120mg , Losartan 50mg  daily, 05/07/16 wbc 7.2, Hgb 17.8, plt 321, Na 141, K 4.6, Bun 29, creat 0.78, TSH 2.3

## 2016-07-03 NOTE — Assessment & Plan Note (Signed)
Stable. Continue Omeprazole 20mg daily. 

## 2016-07-03 NOTE — Assessment & Plan Note (Signed)
Pain is better controlled, continue Tylenol prn, Cymbalta 20mg 

## 2016-07-03 NOTE — Assessment & Plan Note (Signed)
Stable. Continue Spiriva qd, Ventolin HFA qd, prn DuoNeb for maintenance, Prednisone 5mg  daily.

## 2016-07-03 NOTE — Assessment & Plan Note (Signed)
Continue Cymbalta 20mg  bid, continue to monitor s/s of depression.

## 2016-07-03 NOTE — Assessment & Plan Note (Signed)
Stable, continue Levothyroxine 39mcg, last TSH 2.3 05/07/16

## 2016-07-23 ENCOUNTER — Encounter: Payer: Self-pay | Admitting: Internal Medicine

## 2016-07-23 ENCOUNTER — Non-Acute Institutional Stay (SKILLED_NURSING_FACILITY): Payer: Medicare Other | Admitting: Internal Medicine

## 2016-07-23 DIAGNOSIS — F320 Major depressive disorder, single episode, mild: Secondary | ICD-10-CM

## 2016-07-23 DIAGNOSIS — D751 Secondary polycythemia: Secondary | ICD-10-CM

## 2016-07-23 DIAGNOSIS — Z853 Personal history of malignant neoplasm of breast: Secondary | ICD-10-CM

## 2016-07-23 DIAGNOSIS — G8194 Hemiplegia, unspecified affecting left nondominant side: Secondary | ICD-10-CM

## 2016-07-23 DIAGNOSIS — K432 Incisional hernia without obstruction or gangrene: Secondary | ICD-10-CM

## 2016-07-23 DIAGNOSIS — M5440 Lumbago with sciatica, unspecified side: Secondary | ICD-10-CM | POA: Diagnosis not present

## 2016-07-23 DIAGNOSIS — I10 Essential (primary) hypertension: Secondary | ICD-10-CM | POA: Diagnosis not present

## 2016-07-23 DIAGNOSIS — E039 Hypothyroidism, unspecified: Secondary | ICD-10-CM | POA: Diagnosis not present

## 2016-07-23 DIAGNOSIS — K219 Gastro-esophageal reflux disease without esophagitis: Secondary | ICD-10-CM | POA: Diagnosis not present

## 2016-08-27 ENCOUNTER — Telehealth: Payer: Self-pay | Admitting: *Deleted

## 2016-08-27 NOTE — Telephone Encounter (Signed)
Received fax from [facility] #[phone] Fax: [phone] for Mastectomy Supplies: Post Surgical Bras to hold breast prosthesis (6) and Silicone Breast Prosthesis. Placed in Dr. Hervey Ard folder to review and sign.

## 2016-08-28 ENCOUNTER — Encounter: Payer: Self-pay | Admitting: Internal Medicine

## 2016-08-28 ENCOUNTER — Non-Acute Institutional Stay (SKILLED_NURSING_FACILITY): Payer: Medicare Other | Admitting: Internal Medicine

## 2016-08-28 DIAGNOSIS — G8194 Hemiplegia, unspecified affecting left nondominant side: Secondary | ICD-10-CM | POA: Diagnosis not present

## 2016-08-28 DIAGNOSIS — E039 Hypothyroidism, unspecified: Secondary | ICD-10-CM | POA: Diagnosis not present

## 2016-08-28 DIAGNOSIS — I10 Essential (primary) hypertension: Secondary | ICD-10-CM

## 2016-08-28 DIAGNOSIS — J410 Simple chronic bronchitis: Secondary | ICD-10-CM | POA: Diagnosis not present

## 2016-08-28 NOTE — Progress Notes (Signed)
Progress Note      Facility  [facility] Room Number: N10  Place of Service: SNF (31)     Allergies  Allergen Reactions  . Cortisone     Chief Complaint  Patient presents with  . Medical Management of Chronic Issues    routine visit  . Immunizations    needs PCV13    HPI:  Essential hypertension - controlled  Hx of breast cancer -- in remission  Hypothyroidism, unspecified type - compensated  Incisional hernia, without obstruction or gangrene - chronic and unchanged. No pain.  Low back pain with sciatica, sciatica laterality unspecified, unspecified back pain laterality, unspecified chronicity  Gastroesophageal reflux disease without esophagitis - occasional indigestion  Current mild episode of major depressive disorder without prior episode ([place]) - stable on current meds  Left hemiparesis ([place]) - unchanged.  Polycythemia - stable.    Medications: Patient's Medications  New Prescriptions   No medications on file  Previous Medications   ACETAMINOPHEN (TYLENOL) 325 MG TABLET    Take 650 mg by mouth every 4 (four) hours as needed for moderate pain.   ALBUTEROL (VENTOLIN HFA) 108 (90 BASE) MCG/ACT INHALER    Inhale 2 puffs into the lungs daily. Take 2 puffs Every 6 hours as needed for shortness of breath and wheezing   ANTISEPTIC ORAL RINSE (BIOTENE) LIQD    15 mLs by Mouth Rinse route as needed for dry mouth.   ASPIRIN 81 MG CHEWABLE TABLET    Chew 81 mg by mouth every morning.   CARVEDILOL (COREG) 12.5 MG TABLET    Take 12.5 mg by mouth 2 (two) times daily with a meal.   CLONIDINE (CATAPRES) 0.1 MG TABLET    Take 0.1 mg by mouth every morning.    DEXTROMETHORPHAN-GUAIFENESIN (TUSSIN DM) 10-100 MG/5ML LIQUID    Take 5 mLs by mouth. Give 10 ml every 6 hours as needed for cough   DILTIAZEM (CARDIZEM CD) 120 MG 24 HR CAPSULE    Take 120 mg by mouth daily.    DULOXETINE (CYMBALTA) 20 MG CAPSULE    Take 20 mg by mouth. Take one tablet twice daily   FEEDING  SUPPLEMENT (BOOST / RESOURCE BREEZE) LIQD    Take 1 Container by mouth daily as needed.    GLYCERIN, LAXATIVE, (PEDIA-LAX) 2.8 G SUPP    Place rectally as needed (for constipation).   IPRATROPIUM-ALBUTEROL (DUONEB) 0.5-2.5 (3) MG/3ML SOLN    Take 3 mLs by nebulization every 6 (six) hours as needed. For shortness of breath and wheezing.   LEVOTHYROXINE (SYNTHROID, LEVOTHROID) 50 MCG TABLET    Take 50 mcg by mouth every morning.    LIDOCAINE (ASPERCREME LIDOCAINE) 4 % PTCH    Apply 1 patch topically daily.   LOSARTAN (COZAAR) 50 MG TABLET    Take 50 mg by mouth daily.   MELATONIN 3 MG TBDP    Take 3 mg by mouth at bedtime.   MULTIPLE VITAMIN (MULTIVITAMIN WITH MINERALS) TABS TABLET    Take 1 tablet by mouth every morning.   OMEPRAZOLE (PRILOSEC) 20 MG CAPSULE    Take 20 mg by mouth. Take one tablet daily   POLYETHYLENE GLYCOL (MIRALAX / GLYCOLAX) PACKET    Take 17 g by mouth daily as needed.    POLYVINYL ALCOHOL (LIQUIFILM TEARS) 1.4 % OPHTHALMIC SOLUTION    Place 1 drop into both eyes 4 (four) times daily as needed.    PREDNISONE (DELTASONE) 5 MG TABLET    Take 5  mg by mouth daily with breakfast.   TIOTROPIUM (SPIRIVA) 18 MCG INHALATION CAPSULE    Place 18 mcg into inhaler and inhale daily.  Modified Medications   No medications on file  Discontinued Medications   No medications on file     Review of Systems  Constitutional: Positive for fatigue. Negative for chills, diaphoresis and fever.       Generalized weakness.   HENT: Positive for hearing loss. Negative for congestion, ear discharge and ear pain.   Eyes: Negative for pain, discharge and redness.  Respiratory: Negative for cough, shortness of breath, wheezing and stridor.        Chronic hacking cough occasionally  Cardiovascular: Positive for leg swelling. Negative for chest pain and palpitations.       Trace in ankles.   Gastrointestinal: Negative for abdominal pain, blood in stool (none recent; history of blood in stool)),  constipation, diarrhea, nausea and vomiting.       Lower abd incisional hernia. C/o heartburn   Genitourinary: Positive for frequency. Negative for dysuria, flank pain, hematuria and urgency.       Q2-3 hours average.   Musculoskeletal: Positive for back pain. Negative for myalgias and neck pain.       Improved back pain. She is able to ambulates with walker on unit.  Resolution of Left hip pain since 11/25/14.  Skin: Negative for rash.  Neurological: Positive for weakness. Negative for dizziness, tremors, seizures and headaches.  Psychiatric/Behavioral: Negative for hallucinations and suicidal ideas. The patient is not nervous/anxious.        C/o sleeps too much.    Vitals:   07/23/16 1419  BP: (!) 148/72  Pulse: 69  Resp: 20  Temp: 97.4 F (36.3 C)  SpO2: 93%  Weight: 123 lb (55.8 kg)  Height: '5\' 2"'  (1.575 m)   Wt Readings from Last 3 Encounters:  07/23/16 123 lb (55.8 kg)  06/02/16 123 lb (55.8 kg)  05/08/16 125 lb 8 oz (56.9 kg)    Body mass index is 22.5 kg/m.  Physical Exam  Constitutional: She is oriented to person, place, and time. She appears well-developed and well-nourished. No distress.  HENT:  Head: Normocephalic and atraumatic.  Right Ear: External ear normal.  Left Ear: External ear normal.  Nose: Nose normal.  Mouth/Throat: Oropharynx is clear and moist. No oropharyngeal exudate.  Eyes: Conjunctivae and EOM are normal. Pupils are equal, round, and reactive to light. Right eye exhibits no discharge. Left eye exhibits no discharge. No scleral icterus.  Neck: Normal range of motion. Neck supple. No JVD present. No tracheal deviation present. No thyromegaly present.  Cardiovascular: Normal rate, regular rhythm, normal heart sounds and intact distal pulses.   No murmur heard. Pulmonary/Chest: Effort normal. No stridor. No respiratory distress. She has decreased breath sounds. She has no wheezes. She has rales. She exhibits no tenderness.  Bibasilar, posterior  coarse expiratory rhonchi.   Abdominal: Soft. She exhibits no distension and no mass. There is no tenderness.  Reducible lower abd hernia  Genitourinary: Rectal exam shows guaiac positive stool.  Musculoskeletal: Normal range of motion. She exhibits edema and tenderness.  Ankles and feet have trace edema. Mid back pain.  Unstable gait. Uses rolling walker.  Neurological: She is alert and oriented to person, place, and time. No cranial nerve deficit. She exhibits normal muscle tone. Coordination normal.  Left hemiparesis  Skin: Skin is warm and dry. No rash noted. She is not diaphoretic. No erythema. No pallor.  Mid abd  surgical scar-healed-lower abd reducible incisional hernia-the patient has concerns of cosmetic reason but denied pain.   Left mastectomy. Scalp skin lesion above the left ear.   Psychiatric: She has a normal mood and affect. Her behavior is normal. Judgment and thought content normal.  Repetitive.      Labs reviewed: Lab Summary Latest Ref Rng & Units 05/07/2016 04/13/2016 01/08/2016 10/17/2015  Hemoglobin 12.0 - 16.0 g/dL 17.8(A) 19.4(A) 14.7 17.3(A)  Hematocrit 36 - 46 % 53(A) 58(A) 45 52(A)  White count 10:3/mL 7.2 7.2 7.9 5.6  Platelet count 150 - 399 K/L 321 373 428(A) 347  Sodium 137 - 147 mmol/L 141 (None) 139 138  Potassium 3.4 - 5.3 mmol/L 4.6 (None) 4.5 4.8  Calcium - (None) (None) (None) (None)  Phosphorus - (None) (None) (None) (None)  Creatinine 0.5 - 1.1 mg/dL 0.8 (None) 0.6 0.3(A)  AST 13 - 35 U/L 18 (None) 16 18  Alk Phos 25 - 125 U/L 81 (None) 74 104  Bilirubin - (None) (None) (None) (None)  Glucose mg/dL 83 (None) 97 88  Cholesterol - (None) (None) (None) (None)  HDL cholesterol - (None) (None) (None) (None)  Triglycerides - (None) (None) (None) (None)  LDL Direct - (None) (None) (None) (None)  LDL Calc - (None) (None) (None) (None)  Total protein - (None) (None) (None) (None)  Albumin - (None) (None) (None) (None)  Some recent data might be  hidden   Lab Results  Component Value Date   TSH 2.30 05/07/2016   Lab Results  Component Value Date   BUN 29 (A) 05/07/2016   BUN 29 (A) 01/08/2016   BUN 88 (A) 10/17/2015   Lab Results  Component Value Date   CREATININE 0.8 05/07/2016   CREATININE 0.6 01/08/2016   CREATININE 0.3 (A) 10/17/2015   No results found for: HGBA1C     Assessment/Plan  1. Essential hypertension controlled  2. Hx of breast cancer inremission  3. Hypothyroidism, unspecified type compensated  4. Incisional hernia, without obstruction or gangrene unchanged  5. Low back pain with sciatica, sciatica laterality unspecified, unspecified back pain laterality, unspecified chronicity *chronic and unchanged  6. Gastroesophageal reflux disease without esophagitis Stable with minimal symptoms  7. Current mild episode of major depressive disorder without prior episode (HCC) Chronic and stable on current medication  8. Left hemiparesis (HCC) Unchanged. Chronic.  9. Polycythemia Continue to monitor CBC periodically. No further action needed at this time.

## 2016-08-28 NOTE — Progress Notes (Signed)
Location:  [facility] Room Number: N-10A Place of Service:  SNF [phone]) Provider:  Anders Simmonds, Karalee Height, MD  Patient Care Team: Blanchie Serve, MD as PCP - General (Internal Medicine) Gaynelle Arabian, MD as Consulting Physician (Orthopedic Surgery) [name], [place] (Skilled Nursing and [place]) [place], Nelda Bucks, NP as Nurse Practitioner (Family Medicine)  Extended Emergency Contact Information Primary Emergency Contact: Johnston,Betsy Address: [place], [place] Phone: [phone] Relation: Daughter Secondary Emergency Contact: Rey,Lindsey  [place] Phone: [phone] Relation: Daughter  Code Status:  DNR Goals of care: Advanced Directive information Advanced Directives 07/23/2016  Does Patient Have a Medical Advance Directive? Yes  Type of Paramedic of [place];Living will;Out of facility DNR (pink MOST or yellow form)  Does patient want to make changes to medical advance directive? -  Copy of [place] in Chart? Yes  Pre-existing out of facility DNR order (yellow form or pink MOST form) Yellow form placed in chart (order not valid for inpatient use);Pink MOST form placed in chart (order not valid for inpatient use)     Chief Complaint  Patient presents with  . Medical Management of Chronic Issues    Routine Visit     HPI:  Pt is a 83 y.o. female seen today for medical management of chronic diseases.  She complaints of pain to right arm for few days. She is working with restorative team to help with gait and balance since fall few weeks back with left knee bruising. Denies any other concern. No new concern from nursing. She is seen in her room today. On chart review, has elevated BP reading.    Past Medical History:  Diagnosis Date  . Back pain, thoracic 01/12/2014  . Cancer ([place]) 1980   Breast cancer  .  Constipation 11/18/2012  . COPD (chronic obstructive pulmonary disease) ([place])   . CVA (cerebral infarction) 06/08/2011   Following abdominal surgery for bowel obstruction   . Depression 12/26/2013  . DVT (deep venous thrombosis) ([place]) 11/18/2012  . Fall 02/07/2013  . GERD (gastroesophageal reflux disease) 03/30/2014  . Granuloma annulare 06/08/2014  . HTN (hypertension) 11/18/2012   01/15/14 Bun/creat 14/0.60   . Hypertension   . Hypothyroidism   . Incisional hernia, without obstruction or gangrene 05/27/2014   Lower abd from previous surgery-2-3 small incisional hernias which are reducible-no incarceration or pain complained.    . Insomnia 11/18/2012  . Left hemiparesis ([place]) 06/08/2014  . Osteoporosis   . Shortness of breath   . Thoracic spine fracture [facility]) 12/18/2013   01/05/14 T 10 Dr. Newman Pies: PCP managing pain.  F/u 2 months(suggested that if the pain is "not too bad" and seems to be improving that she "live with it" and give a change to heal without intervention.  12/18/13 X-ray Thoracic spine: Compression deformity at the level of T10. Comparing back to a chest x-ray from 12/09/2013, this appears new in the interval.       . TIA (transient ischemic attack)   . Ventral hernia 06/08/2014   Past Surgical History:  Procedure Laterality Date  . ABDOMINAL HYSTERECTOMY    . CATARACT EXTRACTION, BILATERAL    . EXPLORATORY LAPAROTOMY W/ BOWEL RESECTION     10/19/12-11/01/12 [facility] for exploratory laparotomty with extensive lysis of adhesion and segmental small bowel resection with end to end anastomosis  per Dr. Isidore Moos 10/22/12  . MASTECTOMY  1980   left side  . TONSILLECTOMY    . TOTAL KNEE ARTHROPLASTY  2010   left knee    Allergies  Allergen Reactions  . Cortisone     Outpatient Encounter Prescriptions as of 08/28/2016  Medication Sig  . acetaminophen (TYLENOL) 325 MG tablet Take 650 mg by mouth every 4 (four) hours as needed for moderate pain.  Marland Kitchen albuterol (VENTOLIN HFA)  108 (90 Base) MCG/ACT inhaler Inhale 2 puffs into the lungs daily. Take 2 puffs Every 6 hours as needed for shortness of breath and wheezing  . antiseptic oral rinse (BIOTENE) LIQD 15 mLs by Mouth Rinse route daily.   Marland Kitchen aspirin 81 MG chewable tablet Chew 81 mg by mouth every morning.  . bisacodyl (DULCOLAX) 10 MG suppository Place 10 mg rectally every three (3) days as needed for moderate constipation.  . carvedilol (COREG) 12.5 MG tablet Take 12.5 mg by mouth 2 (two) times daily with a meal.  . cloNIDine (CATAPRES) 0.1 MG tablet Take 0.1 mg by mouth every morning.   Marland Kitchen Dextromethorphan-Guaifenesin (TUSSIN DM) 10-100 MG/5ML liquid Take by mouth. Give 10 ml every 6 hours as needed for cough  . diltiazem (CARDIZEM CD) 120 MG 24 hr capsule Take 120 mg by mouth daily.   . DULoxetine (CYMBALTA) 20 MG capsule Take 20 mg by mouth daily.   . feeding supplement (BOOST / RESOURCE BREEZE) LIQD Take 1 Container by mouth 2 (two) times daily.   . Glycerin, Laxative, (PEDIA-LAX) 2.8 g SUPP Place rectally as needed (for constipation).  Marland Kitchen ipratropium-albuterol (DUONEB) 0.5-2.5 (3) MG/3ML SOLN Take 3 mLs by nebulization every 4 (four) hours as needed. For shortness of breath and wheezing.   Marland Kitchen levothyroxine (SYNTHROID, LEVOTHROID) 50 MCG tablet Take 50 mcg by mouth every morning.   . Lidocaine (ASPERCREME LIDOCAINE) 4 % PTCH Apply 1 patch topically daily.  Marland Kitchen losartan (COZAAR) 50 MG tablet Take 50 mg by mouth daily.  . Melatonin 3 MG TBDP Take 3 mg by mouth at bedtime.  . Multiple Vitamin (MULTIVITAMIN WITH MINERALS) TABS tablet Take 1 tablet by mouth every morning.  Marland Kitchen omeprazole (PRILOSEC) 20 MG capsule Take 20 mg by mouth. Take one tablet daily  . polyethylene glycol (MIRALAX / GLYCOLAX) packet Take 17 g by mouth at bedtime.   . polyvinyl alcohol (LIQUIFILM TEARS) 1.4 % ophthalmic solution Place 1 drop into both eyes daily. Can also use QID prn  . predniSONE (DELTASONE) 5 MG tablet Take 5 mg by mouth daily with  breakfast.  . tiotropium (SPIRIVA) 18 MCG inhalation capsule Place 18 mcg into inhaler and inhale daily.   No facility-administered encounter medications on file as of 08/28/2016.     Review of Systems  Constitutional: Negative for appetite change, chills, fatigue and fever.  HENT: Negative for congestion, mouth sores and sinus pressure.   Eyes: Negative for pain.  Respiratory: Positive for shortness of breath. Negative for cough and wheezing.        Has chronic dyspnea with minimal exertion   Cardiovascular: Negative for chest pain, palpitations and leg swelling.  Gastrointestinal: Negative for abdominal pain, blood in stool, diarrhea, nausea and vomiting.  Genitourinary: Negative for dysuria, frequency and hematuria.  Musculoskeletal: Positive for back pain. Negative for joint swelling and neck pain.  Skin: Negative for rash.  Neurological: Negative for dizziness and light-headedness.  Psychiatric/Behavioral: Negative for behavioral problems.    Immunization History  Administered Date(s) Administered  . Influenza,inj,Quad  PF,36+ Mos 12/19/2013  . Influenza-Unspecified 01/21/2013, 12/28/2014, 01/03/2016  . PPD Test 01/02/2014  . Pneumococcal Polysaccharide-23 11/24/1999  . Td 12/28/1994  . Tdap 04/08/2013   Pertinent  Health Maintenance Due  Topic Date Due  . PNA vac Low Risk Adult (2 of 2 - PCV13) 11/23/2000  . DEXA SCAN  03/24/2027 (Originally 05/24/1990)  . INFLUENZA VACCINE  10/21/2016   Fall Risk  12/03/2014 06/08/2014  Falls in the past year? Yes No  Number falls in past yr: 1 -  Injury with Fall? No -  Risk for fall due to : History of fall(s);Impaired balance/gait;Impaired mobility Impaired balance/gait  Follow up Falls evaluation completed -   Functional Status Survey:    Vitals:   08/28/16 1121  BP: (!) 186/89  Pulse: 74  Resp: 20  Temp: (!) 96.7 F (35.9 C)  TempSrc: Oral  SpO2: 94%  Weight: 128 lb (58.1 kg)  Height: 5\' 2"  (1.575 m)   Body mass index is  23.41 kg/m. Physical Exam  Constitutional: She is oriented to person, place, and time. She appears well-developed and well-nourished. No distress.  HENT:  Head: Normocephalic and atraumatic.  Mouth/Throat: Oropharynx is clear and moist.  Eyes: Conjunctivae are normal. Pupils are equal, round, and reactive to light.  Neck: Normal range of motion. Neck supple.  Cardiovascular: Normal rate and regular rhythm.   Pulmonary/Chest: Effort normal and breath sounds normal. She has no wheezes. She has no rales.  Abdominal: Soft. Bowel sounds are normal. There is no tenderness. There is no guarding.  Reducible lower abdominal hernia  Musculoskeletal: She exhibits no edema.  Arthritis changes to her fingers, good ROM to right shoulder and elbow, no reproducible tenderness on right arm and forearm exam. Uses rollator walker  Lymphadenopathy:    She has no cervical adenopathy.  Neurological: She is alert and oriented to person, place, and time.  Skin: Skin is warm and dry. She is not diaphoretic.  Old surgical scar to left knee, resolving bruise to left knee. Left mastectomy.   Psychiatric: She has a normal mood and affect.    Labs reviewed:  Recent Labs  10/17/15 01/08/16 05/07/16  NA 138 139 141  K 4.8 4.5 4.6  BUN 88* 29* 29*  CREATININE 0.3* 0.6 0.8    Recent Labs  10/17/15 01/08/16 05/07/16  AST 18 16 18   ALT 10 10 11   ALKPHOS 104 74 81    Recent Labs  01/08/16 04/13/16 05/07/16  WBC 7.9 7.2 7.2  HGB 14.7 19.4* 17.8*  HCT 45 58* 53*  PLT 428* 373 321   Lab Results  Component Value Date   TSH 2.30 05/07/2016   No results found for: HGBA1C Lab Results  Component Value Date   CHOL 121 11/22/2012   HDL 40 11/22/2012   LDLCALC 65 11/22/2012   TRIG 81 11/22/2012    Significant Diagnostic Results in last 30 days:  No results found.  Assessment/Plan  Hypothyroidism Lab Results  Component Value Date   TSH 2.30 05/07/2016  continue levothyroxine current  regimen  Hypertension Elevated BP on review. Currently on losartan 50 mg daily, coreg 12.5 mg bid and clonidine 0.1 mg daily. Increase coreg to 25 mg bid with holding parameter. Monitor BP reading.   Right arm pain Appears musculoskeletal. Per patient, started after starting restorative therapy. Start tylenol 650 mg bid for 10 days and reassess if no improvement or worsens  Left hemiparesis S/p cva in past. Continue aspirin 81 mg daily. Continue BP  medications. She is followed by palliative care services.    Family/ staff Communication: reviewed care plan with pt and charge nurse  Labs/tests ordered:  None   Blanchie Serve, MD Internal Medicine [place],  [address] Cell Phone (Monday-Friday 8 am - 5 pm): [phone] On Call: [phone] and follow prompts after 5 pm and on weekends Office Phone: [phone] Office Fax: [phone]

## 2016-09-01 ENCOUNTER — Encounter: Payer: Self-pay | Admitting: Family

## 2016-09-01 ENCOUNTER — Non-Acute Institutional Stay (SKILLED_NURSING_FACILITY): Payer: Medicare Other | Admitting: Family

## 2016-09-01 DIAGNOSIS — R635 Abnormal weight gain: Secondary | ICD-10-CM | POA: Diagnosis not present

## 2016-09-01 DIAGNOSIS — S61412A Laceration without foreign body of left hand, initial encounter: Secondary | ICD-10-CM | POA: Diagnosis not present

## 2016-09-01 NOTE — Progress Notes (Signed)
Location:  [place] Room Number: 10 Place of Service:  SNF (31) Provider: Mohamed Portlock FNP-C  Blanchie Serve, MD  Patient Care Team: Blanchie Serve, MD as PCP - General (Internal Medicine) Gaynelle Arabian, MD as Consulting Physician (Orthopedic Surgery) [name], [place] (Skilled Nursing and [place]) Jiana Lemaire, Nelda Bucks, NP as Nurse Practitioner (Family Medicine)  Extended Emergency Contact Information Primary Emergency Contact: Johnston,Betsy Address: [place], [place] of [place] Phone: [phone] Relation: Daughter Secondary Emergency Contact: Rey,Lindsey  [place] Phone: [phone] Relation: Daughter  Code Status:  DNR  Goals of care: Advanced Directive information Advanced Directives 09/01/2016  Does Patient Have a Medical Advance Directive? Yes  Type of Paramedic of [place];Living will;Out of facility DNR (pink MOST or yellow form)  Does patient want to make changes to medical advance directive? -  Copy of [place] in Chart? Yes  Pre-existing out of facility DNR order (yellow form or pink MOST form) Yellow form placed in chart (order not valid for inpatient use);Pink MOST form placed in chart (order not valid for inpatient use)     Chief Complaint  Patient presents with  . Acute Visit    skin tear  on index finger     HPI:  Pt is a 83 y.o. female seen today at [facility] for an acute visit for evaluation of skin tear on left index finger. She is seen in her room today. She states was trying to slid her closet doors sustained a skin tear. Bleeding was stopped by facility Nurse cleanse with saline and triple antibiotic ointment applied. Patient's weight log also shows a four pound weight gain over one month. Patient's weight gain is beneficial due to previous weight loss 123 lbs since December 2017 -April 2018.  Facility Registered Dietician continues to follow up.    Past Medical History:  Diagnosis Date  . Back pain, thoracic 01/12/2014  . Cancer ([place]) 1980   Breast cancer  . Constipation 11/18/2012  . COPD (chronic obstructive pulmonary disease) ([place])   . CVA (cerebral infarction) 06/08/2011   Following abdominal surgery for bowel obstruction   . Depression 12/26/2013  . DVT (deep venous thrombosis) ([place]) 11/18/2012  . Fall 02/07/2013  . GERD (gastroesophageal reflux disease) 03/30/2014  . Granuloma annulare 06/08/2014  . HTN (hypertension) 11/18/2012   01/15/14 Bun/creat 14/0.60   . Hypertension   . Hypothyroidism   . Incisional hernia, without obstruction or gangrene 05/27/2014   Lower abd from previous surgery-2-3 small incisional hernias which are reducible-no incarceration or pain complained.    . Insomnia 11/18/2012  . Left hemiparesis ([place]) 06/08/2014  . Osteoporosis   . Shortness of breath   . Thoracic spine fracture [facility]) 12/18/2013   01/05/14 T 10 Dr. Newman Pies: PCP managing pain.  F/u 2 months(suggested that if the pain is "not too bad" and seems to be improving that she "live with it" and give a change to heal without intervention.  12/18/13 X-ray Thoracic spine: Compression deformity at the level of T10. Comparing back to a chest x-ray from 12/09/2013, this appears new in the interval.       . TIA (transient ischemic attack)   . Ventral hernia 06/08/2014   Past Surgical History:  Procedure Laterality Date  . ABDOMINAL HYSTERECTOMY    . CATARACT EXTRACTION, BILATERAL    . EXPLORATORY  LAPAROTOMY W/ BOWEL RESECTION     10/19/12-11/01/12 [facility] for exploratory laparotomty with extensive lysis of adhesion and segmental small bowel resection with end to end anastomosis per Dr. Isidore Moos 10/22/12  . MASTECTOMY  1980   left side  . TONSILLECTOMY    . TOTAL KNEE ARTHROPLASTY  2010   left knee    Allergies  Allergen Reactions  . Cortisone     Allergies as of 09/01/2016       Reactions   Cortisone       Medication List       Accurate as of 09/01/16  1:03 PM. Always use your most recent med list.          acetaminophen 325 MG tablet Commonly known as:  TYLENOL Take 650 mg by mouth every 4 (four) hours as needed for moderate pain.   antiseptic oral rinse Liqd 15 mLs by Mouth Rinse route daily.   ARTIFICIAL TEARS 0.1-0.3 % Soln Generic drug:  Dextran 70-Hypromellose Apply 1 drop to eye daily. Both eyes daily and up to four times daily as needed for burning.   ASPERCREME LIDOCAINE 4 % Ptch Generic drug:  Lidocaine Apply 1 patch topically daily.   aspirin 81 MG chewable tablet Chew 81 mg by mouth every morning.   bisacodyl 10 MG suppository Commonly known as:  DULCOLAX Place 10 mg rectally every three (3) days as needed for moderate constipation.   carvedilol 25 MG tablet Commonly known as:  COREG Take 25 mg by mouth 2 (two) times daily with a meal. Hold if SBP <110 and HR <60/min   cloNIDine 0.1 MG tablet Commonly known as:  CATAPRES Take 0.1 mg by mouth every morning.   diltiazem 120 MG 24 hr capsule Commonly known as:  CARDIZEM CD Take 120 mg by mouth daily.   DULoxetine 20 MG capsule Commonly known as:  CYMBALTA Take 20 mg by mouth 2 (two) times daily.   famotidine 20 MG tablet Commonly known as:  PEPCID Take 20 mg by mouth daily as needed for heartburn or indigestion.   ipratropium-albuterol 0.5-2.5 (3) MG/3ML Soln Commonly known as:  DUONEB Take 3 mLs by nebulization every 4 (four) hours as needed. For shortness of breath and wheezing.   levothyroxine 50 MCG tablet Commonly known as:  SYNTHROID, LEVOTHROID Take 50 mcg by mouth every morning.   losartan 50 MG tablet Commonly known as:  COZAAR Take 50 mg by mouth daily.   Melatonin 3 MG Tbdp Take 3 mg by mouth at bedtime.   multivitamin with minerals Tabs tablet Take 1 tablet by mouth every morning.   omeprazole 20 MG capsule Commonly known as:  PRILOSEC Take 20 mg  by mouth. Take one tablet daily   PEDIA-LAX 2.8 g Supp Generic drug:  Glycerin (Laxative) Place rectally as needed (for constipation).   polyethylene glycol packet Commonly known as:  MIRALAX / GLYCOLAX Take 17 g by mouth at bedtime.   predniSONE 5 MG tablet Commonly known as:  DELTASONE Take 5 mg by mouth daily with breakfast.   RESOURCE 2.0 Liqd Take 120 mLs by mouth 2 (two) times daily.   tiotropium 18 MCG inhalation capsule Commonly known as:  SPIRIVA Place 18 mcg into inhaler and inhale daily.   TUSSIN DM 10-100 MG/5ML liquid Generic drug:  Dextromethorphan-Guaifenesin Take by mouth. Give 10 ml every 6 hours as needed for cough   VENTOLIN HFA 108 (90 Base) MCG/ACT inhaler Generic drug:  albuterol Inhale 2 puffs into the lungs  daily. Take 2 puffs Every 6 hours as needed for shortness of breath and wheezing       Review of Systems  Constitutional: Negative for activity change, appetite change, chills, fatigue and fever.  HENT: Negative for congestion, rhinorrhea, sinus pain, sinus pressure, sneezing and sore throat.   Eyes: Negative for pain, discharge, redness and itching.  Respiratory: Negative for cough, chest tightness, shortness of breath and wheezing.   Cardiovascular: Negative for chest pain and palpitations.       Chronic leg swelling   Gastrointestinal: Negative for abdominal distention, abdominal pain, constipation, diarrhea, nausea and vomiting.  Genitourinary: Negative for dysuria, flank pain, frequency and urgency.  Musculoskeletal: Positive for arthralgias and gait problem.       Left knee pain   Skin: Negative for color change, pallor and rash.       Left knee bruise from previous fall   Neurological: Negative for dizziness, seizures, light-headedness and headaches.  Hematological: Does not bruise/bleed easily.  Psychiatric/Behavioral: Negative for agitation, confusion, hallucinations and sleep disturbance. The patient is not nervous/anxious.      Immunization History  Administered Date(s) Administered  . Influenza,inj,Quad PF,36+ Mos 12/19/2013  . Influenza-Unspecified 01/21/2013, 12/28/2014, 01/03/2016  . PPD Test 01/02/2014  . Pneumococcal Conjugate-13 07/25/2016  . Pneumococcal Polysaccharide-23 11/24/1999  . Td 12/28/1994  . Tdap 04/08/2013   Pertinent  Health Maintenance Due  Topic Date Due  . DEXA SCAN  03/24/2027 (Originally 05/24/1990)  . INFLUENZA VACCINE  10/21/2016  . PNA vac Low Risk Adult  Completed   Fall Risk  12/03/2014 06/08/2014  Falls in the past year? Yes No  Number falls in past yr: 1 -  Injury with Fall? No -  Risk for fall due to : History of fall(s);Impaired balance/gait;Impaired mobility Impaired balance/gait  Follow up Falls evaluation completed -    Vitals:   09/01/16 0939  BP: 116/60  Pulse: 72  Resp: 20  Temp: 98.4 F (36.9 C)  SpO2: 94%  Weight: 128 lb (58.1 kg)  Height: 5\' 2"  (1.575 m)   Body mass index is 23.41 kg/m. Physical Exam  Constitutional: She is oriented to person, place, and time. She appears well-developed and well-nourished.  Elderly in no acute distress  HENT:  Head: Normocephalic.  Mouth/Throat: Oropharynx is clear and moist. No oropharyngeal exudate.  Eyes: Conjunctivae and EOM are normal. Pupils are equal, round, and reactive to light. Right eye exhibits no discharge. Left eye exhibits no discharge. No scleral icterus.  Neck: Normal range of motion. No JVD present. No thyromegaly present.  Cardiovascular: Normal rate, regular rhythm, normal heart sounds and intact distal pulses.  Exam reveals no gallop and no friction rub.   No murmur heard. Pulmonary/Chest: Effort normal and breath sounds normal. No respiratory distress. She has no wheezes. She has no rales.  Abdominal: Soft. Bowel sounds are normal. She exhibits no distension. There is no tenderness. There is no rebound and no guarding.  Musculoskeletal: She exhibits no tenderness.  Unsteady gait use  rollator. Bilateral arthritic changes on fingers. Left knee limited ROM due to pain. Chronic non-pitting edema worse on left knee.   Lymphadenopathy:    She has no cervical adenopathy.  Neurological: She is oriented to person, place, and time.  Skin: Skin is warm and dry. No rash noted. No erythema. No pallor.  #1.Left index finger skin tear edges well approximated no bleeding noted. Surrounding skin with purple bruise. Finger nail non-tender to touch.    # 2. Left upper  shin area purple bruise progressive healing.   Psychiatric: She has a normal mood and affect.    Labs reviewed:  Recent Labs  10/17/15 01/08/16 05/07/16  NA 138 139 141  K 4.8 4.5 4.6  BUN 88* 29* 29*  CREATININE 0.3* 0.6 0.8    Recent Labs  10/17/15 01/08/16 05/07/16  AST 18 16 18   ALT 10 10 11   ALKPHOS 104 74 81    Recent Labs  01/08/16 04/13/16 05/07/16  WBC 7.9 7.2 7.2  HGB 14.7 19.4* 17.8*  HCT 45 58* 53*  PLT 428* 373 321   Lab Results  Component Value Date   TSH 2.30 05/07/2016   No results found for: HGBA1C Lab Results  Component Value Date   CHOL 121 11/22/2012   HDL 40 11/22/2012   LDLCALC 65 11/22/2012   TRIG 81 11/22/2012    Significant Diagnostic Results in last 30 days:  No results found.  Assessment/Plan 1. Skin tear of left hand without complication, initial encounter Afebrile. Left index finger skin tear edges well approximated no bleeding noted. Surrounding skin with purple bruise.Finger nail non-tender to touch.continue to cleanse skin tear area with saline, pat dry apply triple antibiotic ointment and cover with Band aid until resolved.   2. Weight gain Has had a four pound weight gain over one month. Patient's weight gain is beneficial due to previous weight loss 123 lbs since December 2017 -April 2018. Facility Registered Dietician continues to follow up.continue to monitor.   Family/ staff Communication: Reviewed plan of care with patient and facility Nurse  supervisor  Labs/tests ordered: None   Sandrea Hughs, NP

## 2016-09-29 ENCOUNTER — Telehealth: Payer: Self-pay

## 2016-09-29 NOTE — Telephone Encounter (Signed)
A fax was received from [facility] requesting a dispensing order for non-silicone breast prosthesis for left side.   Forms were placed in [place], NP box for review and signing.   Fax competed forms to [phone] or [phone]

## 2016-09-29 NOTE — Telephone Encounter (Addendum)
Will look at this when I come to the office. Dr. Nyoka Cowden just sent this in back in June. Appears to be a duplicate request. Thanks.

## 2016-09-30 ENCOUNTER — Non-Acute Institutional Stay (SKILLED_NURSING_FACILITY): Payer: Medicare Other | Admitting: Family

## 2016-09-30 ENCOUNTER — Encounter: Payer: Self-pay | Admitting: Family

## 2016-09-30 DIAGNOSIS — J449 Chronic obstructive pulmonary disease, unspecified: Secondary | ICD-10-CM

## 2016-09-30 DIAGNOSIS — K219 Gastro-esophageal reflux disease without esophagitis: Secondary | ICD-10-CM | POA: Diagnosis not present

## 2016-09-30 DIAGNOSIS — E039 Hypothyroidism, unspecified: Secondary | ICD-10-CM

## 2016-09-30 DIAGNOSIS — I10 Essential (primary) hypertension: Secondary | ICD-10-CM

## 2016-09-30 DIAGNOSIS — Z8673 Personal history of transient ischemic attack (TIA), and cerebral infarction without residual deficits: Secondary | ICD-10-CM

## 2016-10-01 DIAGNOSIS — E870 Hyperosmolality and hypernatremia: Secondary | ICD-10-CM | POA: Diagnosis not present

## 2016-10-01 DIAGNOSIS — I10 Essential (primary) hypertension: Secondary | ICD-10-CM | POA: Diagnosis not present

## 2016-10-01 DIAGNOSIS — E119 Type 2 diabetes mellitus without complications: Secondary | ICD-10-CM | POA: Diagnosis not present

## 2016-10-01 DIAGNOSIS — E039 Hypothyroidism, unspecified: Secondary | ICD-10-CM | POA: Diagnosis not present

## 2016-10-01 DIAGNOSIS — E162 Hypoglycemia, unspecified: Secondary | ICD-10-CM | POA: Diagnosis not present

## 2016-10-01 LAB — LIPID PANEL
CHOLESTEROL: 148 (ref 0–200)
HDL: 69 (ref 35–70)
LDL Cholesterol: 64
Triglycerides: 73 (ref 40–160)

## 2016-10-01 LAB — HEMOGLOBIN A1C: HEMOGLOBIN A1C: 5.8

## 2016-10-01 NOTE — Progress Notes (Signed)
Location:  [place] Room Number: 10 Place of Service:  SNF (31) Provider: Tenessa Marsee FNP-C   Blanchie Serve, MD  Patient Care Team: Blanchie Serve, MD as PCP - General (Internal Medicine) Gaynelle Arabian, MD as Consulting Physician (Orthopedic Surgery) [name], [place] (Skilled Nursing and [place]) Patsy Varma, Nelda Bucks, NP as Nurse Practitioner (Family Medicine)  Extended Emergency Contact Information Primary Emergency Contact: Johnston,Betsy Address: [place], [place] Phone: [phone] Relation: Daughter Secondary Emergency Contact: Rey,Lindsey  [place] Phone: [phone] Relation: Daughter  Code Status: DNR Goals of care: Advanced Directive information Advanced Directives 09/30/2016  Does Patient Have a Medical Advance Directive? -  Type of Advance Directive Out of facility DNR (pink MOST or yellow form);Living will;Healthcare Power of Attorney  Does patient want to make changes to medical advance directive? -  Copy of [place] in Chart? Yes  Pre-existing out of facility DNR order (yellow form or pink MOST form) Yellow form placed in chart (order not valid for inpatient use);Pink MOST form placed in chart (order not valid for inpatient use)     Chief Complaint  Patient presents with  . Medical Management of Chronic Issues    routine visit    HPI:  Pt is a 83 y.o. female seen today [facility] for medical management of chronic diseases.she has a medical history of HTN, CVA with left hemiparesis, COPD,Breast Cancer, DVT, hypothyroidism, GERD among other conditions. She is seen in her room today. She denies any acute issues this visit. No recent fall episodes or weight changes. Blood pressure log reviewed readings ranging in the 140's/70's -180's/80's. Facility Nurse reports no new concerns.      Past Medical History:  Diagnosis  Date  . Back pain, thoracic 01/12/2014  . Cancer ([place]) 1980   Breast cancer  . Constipation 11/18/2012  . COPD (chronic obstructive pulmonary disease) ([place])   . CVA (cerebral infarction) 06/08/2011   Following abdominal surgery for bowel obstruction   . Depression 12/26/2013  . DVT (deep venous thrombosis) ([place]) 11/18/2012  . Fall 02/07/2013  . GERD (gastroesophageal reflux disease) 03/30/2014  . Granuloma annulare 06/08/2014  . HTN (hypertension) 11/18/2012   01/15/14 Bun/creat 14/0.60   . Hypertension   . Hypothyroidism   . Incisional hernia, without obstruction or gangrene 05/27/2014   Lower abd from previous surgery-2-3 small incisional hernias which are reducible-no incarceration or pain complained.    . Insomnia 11/18/2012  . Left hemiparesis ([place]) 06/08/2014  . Osteoporosis   . Shortness of breath   . Thoracic spine fracture [facility]) 12/18/2013   01/05/14 T 10 Dr. Newman Pies: PCP managing pain.  F/u 2 months(suggested that if the pain is "not too bad" and seems to be improving that she "live with it" and give a change to heal without intervention.  12/18/13 X-ray Thoracic spine: Compression deformity at the level of T10. Comparing back to a chest x-ray from 12/09/2013, this appears new in the interval.       . TIA (transient ischemic attack)   . Ventral hernia 06/08/2014   Past Surgical History:  Procedure Laterality Date  . ABDOMINAL HYSTERECTOMY    . CATARACT EXTRACTION, BILATERAL    . EXPLORATORY LAPAROTOMY W/ BOWEL RESECTION     10/19/12-11/01/12 [facility] for exploratory laparotomty with extensive lysis of adhesion and segmental small bowel resection with  end to end anastomosis per Dr. Isidore Moos 10/22/12  . MASTECTOMY  1980   left side  . TONSILLECTOMY    . TOTAL KNEE ARTHROPLASTY  2010   left knee    Allergies  Allergen Reactions  . Cortisone     Allergies as of 09/30/2016      Reactions   Cortisone       Medication List       Accurate as of 09/30/16 11:59 PM. Always  use your most recent med list.          acetaminophen 325 MG tablet Commonly known as:  TYLENOL Take 650 mg by mouth every 4 (four) hours as needed for moderate pain.   antiseptic oral rinse Liqd 15 mLs by Mouth Rinse route daily.   ARTIFICIAL TEARS 0.1-0.3 % Soln Generic drug:  Dextran 70-Hypromellose Apply 1 drop to eye daily. Both eyes daily and up to four times daily as needed for burning.   ASPERCREME LIDOCAINE 4 % Ptch Generic drug:  Lidocaine Apply 1 patch topically daily.   aspirin 81 MG chewable tablet Chew 81 mg by mouth every morning.   bisacodyl 10 MG suppository Commonly known as:  DULCOLAX Place 10 mg rectally every three (3) days as needed for moderate constipation.   carvedilol 25 MG tablet Commonly known as:  COREG Take 25 mg by mouth 2 (two) times daily with a meal. Hold if SBP <110 and HR <60/min   cloNIDine 0.1 MG tablet Commonly known as:  CATAPRES Take 0.1 mg by mouth every morning.   diltiazem 120 MG 24 hr capsule Commonly known as:  CARDIZEM CD Take 120 mg by mouth daily.   DULoxetine 20 MG capsule Commonly known as:  CYMBALTA Take 40 mg by mouth daily.   famotidine 20 MG tablet Commonly known as:  PEPCID Take 20 mg by mouth daily as needed for heartburn or indigestion.   ipratropium-albuterol 0.5-2.5 (3) MG/3ML Soln Commonly known as:  DUONEB Take 3 mLs by nebulization every 4 (four) hours as needed. For shortness of breath and wheezing.   levothyroxine 50 MCG tablet Commonly known as:  SYNTHROID, LEVOTHROID Take 50 mcg by mouth every morning.   losartan 50 MG tablet Commonly known as:  COZAAR Take 50 mg by mouth daily.   Melatonin 3 MG Tbdp Take 3 mg by mouth at bedtime.   multivitamin with minerals Tabs tablet Take 1 tablet by mouth every morning.   omeprazole 20 MG capsule Commonly known as:  PRILOSEC Take 20 mg by mouth. Take one tablet daily   PEDIA-LAX 2.8 g Supp Generic drug:  Glycerin (Laxative) Place rectally as  needed (for constipation).   polyethylene glycol packet Commonly known as:  MIRALAX / GLYCOLAX Take 17 g by mouth at bedtime.   predniSONE 5 MG tablet Commonly known as:  DELTASONE Take 5 mg by mouth daily with breakfast.   RESOURCE 2.0 Liqd Take 120 mLs by mouth 2 (two) times daily.   tiotropium 18 MCG inhalation capsule Commonly known as:  SPIRIVA Place 18 mcg into inhaler and inhale daily.   TUSSIN DM 10-100 MG/5ML liquid Generic drug:  Dextromethorphan-Guaifenesin Take by mouth. Give 10 ml every 6 hours as needed for cough   VENTOLIN HFA 108 (90 Base) MCG/ACT inhaler Generic drug:  albuterol Inhale 2 puffs into the lungs daily. Take 2 puffs Every 6 hours as needed for shortness of breath and wheezing       Review of Systems  Constitutional: Negative for activity  change, appetite change, chills, fatigue and fever.  HENT: Negative for congestion, rhinorrhea, sinus pain, sinus pressure, sneezing and sore throat.   Eyes: Negative for pain, discharge, redness and itching.  Respiratory: Negative for cough, chest tightness, shortness of breath and wheezing.   Cardiovascular: Negative for chest pain and palpitations.       Chronic leg swelling   Gastrointestinal: Negative for abdominal distention, abdominal pain, constipation, diarrhea, nausea and vomiting.  Endocrine: Negative.   Genitourinary: Negative for dysuria, flank pain, frequency and urgency.  Musculoskeletal: Positive for arthralgias and gait problem.  Skin: Negative for color change, pallor, rash and wound.  Neurological: Negative for dizziness, seizures, light-headedness and headaches.  Hematological: Does not bruise/bleed easily.  Psychiatric/Behavioral: Negative for agitation, confusion, hallucinations and sleep disturbance. The patient is not nervous/anxious.     Immunization History  Administered Date(s) Administered  . Influenza,inj,Quad PF,36+ Mos 12/19/2013  . Influenza-Unspecified 01/21/2013,  12/28/2014, 01/03/2016  . PPD Test 01/02/2014  . Pneumococcal Conjugate-13 07/25/2016  . Pneumococcal Polysaccharide-23 11/24/1999  . Td 12/28/1994  . Tdap 04/08/2013   Pertinent  Health Maintenance Due  Topic Date Due  . DEXA SCAN  03/24/2027 (Originally 05/24/1990)  . INFLUENZA VACCINE  10/21/2016  . PNA vac Low Risk Adult  Completed   Fall Risk  12/03/2014 06/08/2014  Falls in the past year? Yes No  Number falls in past yr: 1 -  Injury with Fall? No -  Risk for fall due to : History of fall(s);Impaired balance/gait;Impaired mobility Impaired balance/gait  Follow up Falls evaluation completed -    Vitals:   09/30/16 1056  BP: (!) 180/70  Pulse: 68  Temp: (!) 96.2 F (35.7 C)  SpO2: 93%  Weight: 128 lb (58.1 kg)  Height: 5\' 2"  (1.575 m)   Body mass index is 23.41 kg/m. Physical Exam  Constitutional: She is oriented to person, place, and time. She appears well-developed and well-nourished.  Elderly in no acute distress  HENT:  Head: Normocephalic.  Right Ear: External ear normal.  Left Ear: External ear normal.  Mouth/Throat: Oropharynx is clear and moist. No oropharyngeal exudate.  Eyes: Pupils are equal, round, and reactive to light. Conjunctivae and EOM are normal. Right eye exhibits no discharge. Left eye exhibits no discharge. No scleral icterus.  Neck: Normal range of motion. No JVD present. No thyromegaly present.  Cardiovascular: Normal rate, regular rhythm, normal heart sounds and intact distal pulses.  Exam reveals no gallop and no friction rub.   No murmur heard. Pulmonary/Chest: Effort normal and breath sounds normal. No respiratory distress. She has no wheezes. She has no rales.  Abdominal: Soft. Bowel sounds are normal. She exhibits no distension. There is no tenderness. There is no rebound and no guarding.  Musculoskeletal: She exhibits no tenderness.  Bilateral arthritic changes on fingers.moves x 4 extremities gait unsteady. Uses Rolator.Lower extremities  chronic non-pitting edema.   Lymphadenopathy:    She has no cervical adenopathy.  Neurological: She is oriented to person, place, and time.  Skin: Skin is warm and dry. No rash noted. No erythema. No pallor.  Psychiatric: She has a normal mood and affect.    Labs reviewed:  Recent Labs  10/17/15 01/08/16 05/07/16  NA 138 139 141  K 4.8 4.5 4.6  BUN 88* 29* 29*  CREATININE 0.3* 0.6 0.8    Recent Labs  10/17/15 01/08/16 05/07/16  AST 18 16 18   ALT 10 10 11   ALKPHOS 104 74 81    Recent Labs  01/08/16  04/13/16 05/07/16  WBC 7.9 7.2 7.2  HGB 14.7 19.4* 17.8*  HCT 45 58* 53*  PLT 428* 373 321   Lab Results  Component Value Date   TSH 2.30 05/07/2016   No results found for: HGBA1C Lab Results  Component Value Date   CHOL 121 11/22/2012   HDL 40 11/22/2012   LDLCALC 65 11/22/2012   TRIG 81 11/22/2012    Significant Diagnostic Results in last 30 days:  No results found.  Assessment/Plan 1. Essential hypertension Blood pressure log reviewed readings ranging in the 140's/70's -180's/80's.continue on diltiazem 120 mg 24 Hr capsule, carvedilol 25 mg tablet twice daily, and clonidine 0.1 mg tablet daily. Increase Losartan to 100 mg tablet daily. Continue to monitor B/p every shift.   2. Chronic obstructive pulmonary disease Breathing stable. Continue on spiriva 18 mcg inhalation capsule and Duoneb. Continue to monitor.   3. Gastroesophageal reflux disease without esophagitis Stable.continue on famotidine 20 mg Tablet.discontinue Omeprazole 20 mg capsule. Continue to monitor.   4. Hypothyroidism Lab Results  Component Value Date   TSH 2.30 05/07/2016  Continue on levothyroxine 50 mcg tablet daily. Recheck TSH level 10/01/2016.   5. History of CVA (cerebrovascular accident) Continue on diltiazem 120 mg 24 Hr capsule, carvedilol 25 mg tablet twice daily, and clonidine 0.1 mg tablet daily. Increase Losartan to 100 mg tablet daily.on ASA 81 mg Tablet. Recheck Hgb A1C and  Lipid panel 10/01/2016.   Family/ staff Communication: Reviewed plan of care with patient and facility Nurse supervisor   Labs/tests ordered:  TSH level,Hgb A1C and Lipid panel 10/01/2016 Sandrea Hughs, NP

## 2016-10-01 NOTE — Telephone Encounter (Signed)
Confirmed duplicate message. Refaxed initial form.

## 2016-10-08 DIAGNOSIS — F411 Generalized anxiety disorder: Secondary | ICD-10-CM | POA: Diagnosis not present

## 2016-10-15 DIAGNOSIS — N390 Urinary tract infection, site not specified: Secondary | ICD-10-CM | POA: Diagnosis not present

## 2016-10-16 DIAGNOSIS — N390 Urinary tract infection, site not specified: Secondary | ICD-10-CM | POA: Diagnosis not present

## 2016-10-17 DIAGNOSIS — M545 Low back pain: Secondary | ICD-10-CM | POA: Diagnosis not present

## 2016-10-17 DIAGNOSIS — M546 Pain in thoracic spine: Secondary | ICD-10-CM | POA: Diagnosis not present

## 2016-10-26 ENCOUNTER — Non-Acute Institutional Stay (SKILLED_NURSING_FACILITY): Payer: Medicare Other

## 2016-10-26 ENCOUNTER — Non-Acute Institutional Stay: Payer: Self-pay | Admitting: Internal Medicine

## 2016-10-26 ENCOUNTER — Encounter: Payer: Self-pay | Admitting: Internal Medicine

## 2016-10-26 DIAGNOSIS — M25472 Effusion, left ankle: Secondary | ICD-10-CM

## 2016-10-26 DIAGNOSIS — Z0000 Encounter for general adult medical examination without abnormal findings: Secondary | ICD-10-CM

## 2016-10-26 DIAGNOSIS — M25572 Pain in left ankle and joints of left foot: Secondary | ICD-10-CM

## 2016-10-26 NOTE — Progress Notes (Signed)
Location:  [place] Room Number: N-10 Place of Service:  SNF [phone]) Provider:  Anders Simmonds, Karalee Height, MD  Patient Care Team: Blanchie Serve, MD as PCP - General (Internal Medicine) Gaynelle Arabian, MD as Consulting Physician (Orthopedic Surgery) [name], [place] (Skilled Nursing and [place]) [place], Nelda Bucks, NP as Nurse Practitioner (Family Medicine)  Extended Emergency Contact Information Primary Emergency Contact: Johnston,Betsy Address: [place], [place] Phone: [phone] Relation: Daughter Secondary Emergency Contact: Rey,Lindsey  [place] Phone: [phone] Relation: Daughter  Code Status:  DNR Goals of care: Advanced Directive information Advanced Directives 10/26/2016  Does Patient Have a Medical Advance Directive? Yes  Type of Paramedic of [place];Living will;Out of facility DNR (pink MOST or yellow form)  Does patient want to make changes to medical advance directive? No - Patient declined  Copy of [place] in Chart? Yes  Pre-existing out of facility DNR order (yellow form or pink MOST form) Yellow form placed in chart (order not valid for inpatient use);Pink MOST form placed in chart (order not valid for inpatient use)     Chief Complaint  Patient presents with  . Acute Visit    Left ankle pain    HPI:  Pt is a 83 y.o. female seen today for acute visit. She has been having acute pain to left ankle x 2 days with increased swelling. Pain worsens with internal and external rotation at her ankle. Staff agree to swelling in her ankle having increased over last 2-3 days. She denies any trauma or injury. She gets around and ambulates with help of her walker. She has history of trace chronic ankle edema.    Past Medical History:  Diagnosis Date  . Back pain, thoracic 01/12/2014  . Cancer ([place]) 1980     Breast cancer  . Constipation 11/18/2012  . COPD (chronic obstructive pulmonary disease) ([place])   . CVA (cerebral infarction) 06/08/2011   Following abdominal surgery for bowel obstruction   . Depression 12/26/2013  . DVT (deep venous thrombosis) ([place]) 11/18/2012  . Fall 02/07/2013  . GERD (gastroesophageal reflux disease) 03/30/2014  . Granuloma annulare 06/08/2014  . HTN (hypertension) 11/18/2012   01/15/14 Bun/creat 14/0.60   . Hypertension   . Hypothyroidism   . Incisional hernia, without obstruction or gangrene 05/27/2014   Lower abd from previous surgery-2-3 small incisional hernias which are reducible-no incarceration or pain complained.    . Insomnia 11/18/2012  . Left hemiparesis ([place]) 06/08/2014  . Osteoporosis   . Shortness of breath   . Thoracic spine fracture [facility]) 12/18/2013   01/05/14 T 10 Dr. Newman Pies: PCP managing pain.  F/u 2 months(suggested that if the pain is "not too bad" and seems to be improving that she "live with it" and give a change to heal without intervention.  12/18/13 X-ray Thoracic spine: Compression deformity at the level of T10. Comparing back to a chest x-ray from 12/09/2013, this appears new in the interval.       . TIA (transient ischemic attack)   . Ventral hernia 06/08/2014   Past Surgical History:  Procedure Laterality Date  . ABDOMINAL HYSTERECTOMY    . CATARACT EXTRACTION, BILATERAL    . EXPLORATORY LAPAROTOMY W/ BOWEL RESECTION     10/19/12-11/01/12 [facility] for exploratory laparotomty with extensive lysis of adhesion and segmental small bowel resection  with end to end anastomosis per Dr. Isidore Moos 10/22/12  . MASTECTOMY  1980   left side  . TONSILLECTOMY    . TOTAL KNEE ARTHROPLASTY  2010   left knee    Allergies  Allergen Reactions  . Cortisone     Outpatient Encounter Prescriptions as of 10/26/2016  Medication Sig  . acetaminophen (TYLENOL) 325 MG tablet Take 650 mg by mouth every 4 (four) hours as needed for moderate pain. In  addition to her scheduled dose  . acetaminophen (TYLENOL) 325 MG tablet Take 650 mg by mouth 2 (two) times daily.  Marland Kitchen albuterol (VENTOLIN HFA) 108 (90 Base) MCG/ACT inhaler Inhale 2 puffs into the lungs daily. Take 2 puffs Every 6 hours as needed for shortness of breath and wheezing  . antiseptic oral rinse (BIOTENE) LIQD 15 mLs by Mouth Rinse route daily as needed for dry mouth.   Marland Kitchen aspirin 81 MG chewable tablet Chew 81 mg by mouth every morning.  . bisacodyl (DULCOLAX) 10 MG suppository Place 10 mg rectally every three (3) days as needed for moderate constipation.  . carvedilol (COREG) 25 MG tablet Take 25 mg by mouth 2 (two) times daily with a meal. Hold if SBP <110 and HR <60/min  . cloNIDine (CATAPRES) 0.1 MG tablet Take 0.1 mg by mouth every morning.   Marland Kitchen Dextran 70-Hypromellose (ARTIFICIAL TEARS) 0.1-0.3 % SOLN Apply 1 drop to eye daily. Both eyes daily and up to four times daily as needed for burning.  Marland Kitchen Dextromethorphan-Guaifenesin (TUSSIN DM) 10-100 MG/5ML liquid Take by mouth. Give 10 ml every 6 hours as needed for cough  . diltiazem (CARDIZEM CD) 120 MG 24 hr capsule Take 120 mg by mouth daily.   . DULoxetine (CYMBALTA) 20 MG capsule Take 40 mg by mouth daily.   . Glycerin, Laxative, (PEDIA-LAX) 2.8 g SUPP Place rectally as needed (for constipation).  Marland Kitchen ipratropium-albuterol (DUONEB) 0.5-2.5 (3) MG/3ML SOLN Take 3 mLs by nebulization every 4 (four) hours as needed. For shortness of breath and wheezing.   Marland Kitchen levothyroxine (SYNTHROID, LEVOTHROID) 50 MCG tablet Take 50 mcg by mouth every morning.   Marland Kitchen losartan (COZAAR) 100 MG tablet Take 100 mg by mouth daily.  . Melatonin 3 MG TBDP Take 3 mg by mouth at bedtime.  . Multiple Vitamin (MULTIVITAMIN WITH MINERALS) TABS tablet Take 1 tablet by mouth every morning.  . Nutritional Supplements (RESOURCE 2.0) LIQD Take 120 mLs by mouth 2 (two) times daily.  Marland Kitchen omeprazole (PRILOSEC) 20 MG capsule Take 20 mg by mouth daily as needed.   . polyethylene  glycol (MIRALAX / GLYCOLAX) packet Take 17 g by mouth at bedtime.   . predniSONE (DELTASONE) 5 MG tablet Take 5 mg by mouth daily with breakfast.  . tiotropium (SPIRIVA) 18 MCG inhalation capsule Place 18 mcg into inhaler and inhale daily.  . traMADol (ULTRAM) 50 MG tablet Take 50 mg by mouth every 6 (six) hours as needed.  . [DISCONTINUED] losartan (COZAAR) 50 MG tablet Take 50 mg by mouth daily.   No facility-administered encounter medications on file as of 10/26/2016.     Review of Systems  Constitutional: Negative for appetite change, chills, fatigue and fever.  HENT: Negative for congestion, mouth sores and sinus pressure.   Eyes: Negative for pain.  Respiratory: Positive for shortness of breath. Negative for cough and wheezing.        Has chronic dyspnea with minimal exertion but no worsening of her chronic dyspnea this visit  Cardiovascular: Positive for leg  swelling. Negative for chest pain.  Gastrointestinal: Negative for nausea and vomiting.  Genitourinary: Negative for dysuria.  Musculoskeletal: Positive for arthralgias, back pain, gait problem and joint swelling. Negative for neck pain.  Skin: Negative for rash.  Neurological: Negative for dizziness and light-headedness.  Psychiatric/Behavioral: Negative for behavioral problems.    Immunization History  Administered Date(s) Administered  . Influenza,inj,Quad PF,36+ Mos 12/19/2013  . Influenza-Unspecified 01/21/2013, 12/28/2014, 01/03/2016  . PPD Test 01/02/2014  . Pneumococcal Conjugate-13 07/25/2016  . Pneumococcal Polysaccharide-23 11/24/1999  . Td 12/28/1994  . Tdap 04/08/2013   Pertinent  Health Maintenance Due  Topic Date Due  . INFLUENZA VACCINE  10/21/2016  . DEXA SCAN  Completed  . PNA vac Low Risk Adult  Completed   Fall Risk  10/26/2016 12/03/2014 06/08/2014  Falls in the past year? Yes Yes No  Number falls in past yr: 2 or more 1 -  Injury with Fall? No No -  Risk for fall due to : - History of  fall(s);Impaired balance/gait;Impaired mobility Impaired balance/gait  Follow up - Falls evaluation completed -   Functional Status Survey:    Vitals:   10/26/16 1512  BP: 140/80  Pulse: 72  Resp: 20  Temp: (!) 96.8 F (36 C)  TempSrc: Oral  Weight: 128 lb (58.1 kg)  Height: 5\' 2"  (1.575 m)   Body mass index is 23.41 kg/m.   Wt Readings from Last 3 Encounters:  10/26/16 128 lb (58.1 kg)  10/26/16 128 lb (58.1 kg)  09/30/16 128 lb (58.1 kg)   Physical Exam  Constitutional: She is oriented to person, place, and time. She appears well-developed and well-nourished. No distress.  HENT:  Head: Normocephalic and atraumatic.  Eyes: Pupils are equal, round, and reactive to light. Conjunctivae are normal.  Neck: Normal range of motion. Neck supple.  Cardiovascular: Normal rate and regular rhythm.   Pulmonary/Chest: Effort normal and breath sounds normal. She has no wheezes. She has no rales.  Abdominal: Soft. Bowel sounds are normal. There is no tenderness.  Reducible lower abdominal hernia  Musculoskeletal: She exhibits edema.  Able to move all 4 extremities, pain with inversion and eversion at left ankle. Able to flex and extend her ankle joint. 1+ edema to left ankle with trace edema to her leg and trace right ankle edema. Mild warmth to touch to left ankle. No visible bruise. Skin intact. Arthritis changes to her fingers. Uses rolling walker  Lymphadenopathy:    She has no cervical adenopathy.  Neurological: She is alert and oriented to person, place, and time.  Skin: Skin is warm and dry. She is not diaphoretic.  Old surgical scar to left knee   Psychiatric: She has a normal mood and affect.    Labs reviewed:  Recent Labs  01/08/16 05/07/16  NA 139 141  K 4.5 4.6  BUN 29* 29*  CREATININE 0.6 0.8    Recent Labs  01/08/16 05/07/16  AST 16 18  ALT 10 11  ALKPHOS 74 81    Recent Labs  01/08/16 04/13/16 05/07/16  WBC 7.9 7.2 7.2  HGB 14.7 19.4* 17.8*  HCT 45  58* 53*  PLT 428* 373 321   Lab Results  Component Value Date   TSH 2.30 05/07/2016   No results found for: HGBA1C Lab Results  Component Value Date   CHOL 148 10/01/2016   HDL 69 10/01/2016   LDLCALC 64 10/01/2016   TRIG 73 10/01/2016    Significant Diagnostic Results in last 30 days:  No results found.  Assessment/Plan  Left ankle pain  With acute swelling. No fall or trauma that pt or staff recall. Mild warmth present but no signs of cellulitis. No known history of gout before and her pain is only with certain type of ROM. Less likely for a gout flare up. Change her tylenol to 650 mg tid from bid and continue tramadol 50 mg q6h prn pain. Obtain xray of left ankle 3 views to assess further. NWB to LLE for now until xray result is obtained. Pt and nursing agree with care plan  Left ankle swelling Has swelling to both right and left ankle but left > right. Homan's sign negative. Keep legs elevated at rest for now. If xray rules out fracture, will provide ted hose to help with leg edema. Start furosemide 20 mg daily for now with kcl 10 meq daily to help with leg edema.    Family/ staff Communication: reviewed care plan with pt and charge nurse  Labs/tests ordered:  Xray left ankle  Spent 30 minutes with patient of which more than 50% related to direct patient care.     Blanchie Serve, MD Internal Medicine [facility] [address] [place], [place] [address] Cell Phone (Monday-Friday 8 am - 5 pm): [phone] On Call: [phone] and follow prompts after 5 pm and on weekends Office Phone: [phone] Office Fax: [phone]

## 2016-10-26 NOTE — Progress Notes (Signed)
Subjective:   Dana George is a 82 y.o. female who presents for Medicare Annual (Subsequent) preventive examination    Objective:     Vitals: BP (!) 154/80 (BP Location: Right Arm, Patient Position: Sitting)   Pulse 65   Temp 98 F (36.7 C) (Oral)   Ht 5\' 2"  (1.575 m)   Wt 128 lb (58.1 kg)   SpO2 93%   BMI 23.41 kg/m   Body mass index is 23.41 kg/m.   Tobacco History  Smoking Status  . Former Smoker  . Packs/day: 1.00  . Years: 45.00  . Quit date: 01/26/1986  Smokeless Tobacco  . Never Used     Counseling given: Not Answered   Past Medical History:  Diagnosis Date  . Back pain, thoracic 01/12/2014  . Cancer ([place]) 1980   Breast cancer  . Constipation 11/18/2012  . COPD (chronic obstructive pulmonary disease) ([place])   . CVA (cerebral infarction) 06/08/2011   Following abdominal surgery for bowel obstruction   . Depression 12/26/2013  . DVT (deep venous thrombosis) ([place]) 11/18/2012  . Fall 02/07/2013  . GERD (gastroesophageal reflux disease) 03/30/2014  . Granuloma annulare 06/08/2014  . HTN (hypertension) 11/18/2012   01/15/14 Bun/creat 14/0.60   . Hypertension   . Hypothyroidism   . Incisional hernia, without obstruction or gangrene 05/27/2014   Lower abd from previous surgery-2-3 small incisional hernias which are reducible-no incarceration or pain complained.    . Insomnia 11/18/2012  . Left hemiparesis ([place]) 06/08/2014  . Osteoporosis   . Shortness of breath   . Thoracic spine fracture [facility]) 12/18/2013   01/05/14 T 10 Dr. Newman Pies: PCP managing pain.  F/u 2 months(suggested that if the pain is "not too bad" and seems to be improving that she "live with it" and give a change to heal without intervention.  12/18/13 X-ray Thoracic spine: Compression deformity at the level of T10. Comparing back to a chest x-ray from 12/09/2013, this appears new in the interval.       . TIA (transient ischemic attack)   . Ventral hernia 06/08/2014   Past Surgical History:    Procedure Laterality Date  . ABDOMINAL HYSTERECTOMY    . CATARACT EXTRACTION, BILATERAL    . EXPLORATORY LAPAROTOMY W/ BOWEL RESECTION     10/19/12-11/01/12 [facility] for exploratory laparotomty with extensive lysis of adhesion and segmental small bowel resection with end to end anastomosis per Dr. Isidore Moos 10/22/12  . MASTECTOMY  1980   left side  . TONSILLECTOMY    . TOTAL KNEE ARTHROPLASTY  2010   left knee   Family History  Problem Relation Age of Onset  . High blood pressure Mother   . Osteoporosis Mother   . Heart attack Father        d/o 25   History  Sexual Activity  . Sexual activity: No    Outpatient Encounter Prescriptions as of 10/26/2016  Medication Sig  . acetaminophen (TYLENOL) 325 MG tablet Take 650 mg by mouth every 4 (four) hours as needed for moderate pain.  Marland Kitchen albuterol (VENTOLIN HFA) 108 (90 Base) MCG/ACT inhaler Inhale 2 puffs into the lungs daily. Take 2 puffs Every 6 hours as needed for shortness of breath and wheezing  . antiseptic oral rinse (BIOTENE) LIQD 15 mLs by Mouth Rinse route daily.   Marland Kitchen aspirin 81 MG chewable tablet Chew 81 mg by mouth every morning.  . bisacodyl (DULCOLAX) 10 MG suppository Place 10 mg rectally every three (3) days as needed  for moderate constipation.  . carvedilol (COREG) 25 MG tablet Take 25 mg by mouth 2 (two) times daily with a meal. Hold if SBP <110 and HR <60/min  . cloNIDine (CATAPRES) 0.1 MG tablet Take 0.1 mg by mouth every morning.   Marland Kitchen Dextran 70-Hypromellose (ARTIFICIAL TEARS) 0.1-0.3 % SOLN Apply 1 drop to eye daily. Both eyes daily and up to four times daily as needed for burning.  Marland Kitchen Dextromethorphan-Guaifenesin (TUSSIN DM) 10-100 MG/5ML liquid Take by mouth. Give 10 ml every 6 hours as needed for cough  . diltiazem (CARDIZEM CD) 120 MG 24 hr capsule Take 120 mg by mouth daily.   . DULoxetine (CYMBALTA) 20 MG capsule Take 40 mg by mouth daily.   . Glycerin, Laxative, (PEDIA-LAX) 2.8 g SUPP Place rectally as needed (for  constipation).  Marland Kitchen ipratropium-albuterol (DUONEB) 0.5-2.5 (3) MG/3ML SOLN Take 3 mLs by nebulization every 4 (four) hours as needed. For shortness of breath and wheezing.   Marland Kitchen levothyroxine (SYNTHROID, LEVOTHROID) 50 MCG tablet Take 50 mcg by mouth every morning.   Marland Kitchen losartan (COZAAR) 50 MG tablet Take 50 mg by mouth daily.  . Melatonin 3 MG TBDP Take 3 mg by mouth at bedtime.  . Multiple Vitamin (MULTIVITAMIN WITH MINERALS) TABS tablet Take 1 tablet by mouth every morning.  . Nutritional Supplements (RESOURCE 2.0) LIQD Take 120 mLs by mouth 2 (two) times daily.  Marland Kitchen omeprazole (PRILOSEC) 20 MG capsule Take 20 mg by mouth. Take one tablet daily  . polyethylene glycol (MIRALAX / GLYCOLAX) packet Take 17 g by mouth at bedtime.   . predniSONE (DELTASONE) 5 MG tablet Take 5 mg by mouth daily with breakfast.  . tiotropium (SPIRIVA) 18 MCG inhalation capsule Place 18 mcg into inhaler and inhale daily.  . traMADol (ULTRAM) 50 MG tablet Take 50 mg by mouth every 6 (six) hours as needed.  . [DISCONTINUED] famotidine (PEPCID) 20 MG tablet Take 20 mg by mouth daily as needed for heartburn or indigestion.  . [DISCONTINUED] Lidocaine (ASPERCREME LIDOCAINE) 4 % PTCH Apply 1 patch topically daily.   No facility-administered encounter medications on file as of 10/26/2016.     Activities of Daily Living In your present state of health, do you have any difficulty performing the following activities: 10/26/2016  Hearing? Y  Vision? N  Difficulty concentrating or making decisions? Y  Walking or climbing stairs? Y  Dressing or bathing? Y  Doing errands, shopping? Y  Preparing Food and eating ? Y  Using the Toilet? Y  In the past six months, have you accidently leaked urine? N  Do you have problems with loss of bowel control? N  Managing your Medications? Y  Managing your Finances? Y  Housekeeping or managing your Housekeeping? Y  Some recent data might be hidden    Patient Care Team: Blanchie Serve, MD as  PCP - General (Internal Medicine) Gaynelle Arabian, MD as Consulting Physician (Orthopedic Surgery) [name], [place] ([place]) Klein, Nelda Bucks, NP as Nurse Practitioner (Family Medicine)    Assessment:     Exercise Activities and Dietary recommendations Current Exercise Habits: Structured exercise class, Type of exercise: Other - see comments (physical therapy), Time (Minutes): 45, Frequency (Times/Week): 3, Weekly Exercise (Minutes/Week): 135, Intensity: Mild, Exercise limited by: None identified  Goals    None     Fall Risk Fall Risk  10/26/2016 12/03/2014 06/08/2014  Falls in the past year? Yes Yes No  Number falls in past yr: 2 or more  1 -  Injury with Fall? No No -  Risk for fall due to : - History of fall(s);Impaired balance/gait;Impaired mobility Impaired balance/gait  Follow up - Falls evaluation completed -   Depression Screen PHQ 2/9 Scores 10/26/2016 12/03/2014 06/08/2014  PHQ - 2 Score 4 0 0  PHQ- 9 Score 14 - -     Cognitive Function     6CIT Screen 10/26/2016  What Year? 0 points  What month? 0 points  What time? 0 points  Count back from 20 0 points  Months in reverse 0 points  Repeat phrase 4 points  Total Score 4    Immunization History  Administered Date(s) Administered  . Influenza,inj,Quad PF,36+ Mos 12/19/2013  . Influenza-Unspecified 01/21/2013, 12/28/2014, 01/03/2016  . PPD Test 01/02/2014  . Pneumococcal Conjugate-13 07/25/2016  . Pneumococcal Polysaccharide-23 11/24/1999  . Td 12/28/1994  . Tdap 04/08/2013   Screening Tests Health Maintenance  Topic Date Due  . INFLUENZA VACCINE  10/21/2016  . TETANUS/TDAP  04/09/2023  . DEXA SCAN  Completed  . PNA vac Low Risk Adult  Completed      Plan:  I have personally reviewed and addressed the Medicare Annual Wellness questionnaire and have noted the following in the patient's chart:  A. Medical and social history B. Use of alcohol, tobacco or illicit drugs    C. Current medications and supplements D. Functional ability and status E.  Nutritional status F.  Physical activity G. Advance directives H. List of other physicians I.  Hospitalizations, surgeries, and ER visits in previous 12 months J.  [place] to include hearing, vision, cognitive, depression L. Referrals and appointments - none  In addition, I have reviewed and discussed with patient certain preventive protocols, quality metrics, and best practice recommendations. A written personalized care plan for preventive services as well as general preventive health recommendations were provided to patient.  See attached scanned questionnaire for additional information.   Signed,   Rich Reining, RN Nurse Health Advisor   Quick Notes   Health Maintenance: Up to date     Abnormal Screen: 6 CIT-4, PHQ-9     Patient Concerns: L ankle swollen since yesterday-nursing staff and provider notified      Nurse Concerns: None

## 2016-10-26 NOTE — Patient Instructions (Signed)
Dana George , Thank you for taking time to come for your Medicare Wellness Visit. I appreciate your ongoing commitment to your health goals. Please review the following plan we discussed and let me know if I can assist you in the future.   Screening recommendations/referrals: Colonoscopy excluded, pt over age 81 Mammogram excluded, pt over age 56 Bone Density up to date Recommended yearly ophthalmology/optometry visit for glaucoma screening and checkup Recommended yearly dental visit for hygiene and checkup  Vaccinations: Influenza vaccine due 2018 fall season Pneumococcal vaccine up to date Tdap vaccine up to date. Due 04/09/23  Shingles vaccine not in records  Advanced directives: In Chart  Conditions/risks identified: Swollen L ankle  Next appointment: Dr. Bubba Camp makes rounds   Preventive Care 65 Years and Older, Female Preventive care refers to lifestyle choices and visits with your health care provider that can promote health and wellness. What does preventive care include?  A yearly physical exam. This is also called an annual well check.  Dental exams once or twice a year.  Routine eye exams. Ask your health care provider how often you should have your eyes checked.  Personal lifestyle choices, including:  Daily care of your teeth and gums.  Regular physical activity.  Eating a healthy diet.  Avoiding tobacco and drug use.  Limiting alcohol use.  Practicing safe sex.  Taking low-dose aspirin every day.  Taking vitamin and mineral supplements as recommended by your health care provider. What happens during an annual well check? The services and screenings done by your health care provider during your annual well check will depend on your age, overall health, lifestyle risk factors, and family history of disease. Counseling  Your health care provider may ask you questions about your:  Alcohol use.  Tobacco use.  Drug use.  Emotional well-being.  Home  and relationship well-being.  Sexual activity.  Eating habits.  History of falls.  Memory and ability to understand (cognition).  Work and work Statistician.  Reproductive health. Screening  You may have the following tests or measurements:  Height, weight, and BMI.  Blood pressure.  Lipid and cholesterol levels. These may be checked every 5 years, or more frequently if you are over 52 years old.  Skin check.  Lung cancer screening. You may have this screening every year starting at age 54 if you have a 30-pack-year history of smoking and currently smoke or have quit within the past 15 years.  Fecal occult blood test (FOBT) of the stool. You may have this test every year starting at age 38.  Flexible sigmoidoscopy or colonoscopy. You may have a sigmoidoscopy every 5 years or a colonoscopy every 10 years starting at age 103.  Hepatitis C blood test.  Hepatitis B blood test.  Sexually transmitted disease (STD) testing.  Diabetes screening. This is done by checking your blood sugar (glucose) after you have not eaten for a while (fasting). You may have this done every 1-3 years.  Bone density scan. This is done to screen for osteoporosis. You may have this done starting at age 39.  Mammogram. This may be done every 1-2 years. Talk to your health care provider about how often you should have regular mammograms. Talk with your health care provider about your test results, treatment options, and if necessary, the need for more tests. Vaccines  Your health care provider may recommend certain vaccines, such as:  Influenza vaccine. This is recommended every year.  Tetanus, diphtheria, and acellular pertussis (Tdap, Td) vaccine.  You may need a Td booster every 10 years.  Zoster vaccine. You may need this after age 13.  Pneumococcal 13-valent conjugate (PCV13) vaccine. One dose is recommended after age 69.  Pneumococcal polysaccharide (PPSV23) vaccine. One dose is recommended  after age 52. Talk to your health care provider about which screenings and vaccines you need and how often you need them. This information is not intended to replace advice given to you by your health care provider. Make sure you discuss any questions you have with your health care provider. Document Released: 04/05/2015 Document Revised: 11/27/2015 Document Reviewed: 01/08/2015 Elsevier Interactive Patient Education  2017 [place] Prevention in the Home Falls can cause injuries. They can happen to people of all ages. There are many things you can do to make your home safe and to help prevent falls. What can I do on the outside of my home?  Regularly fix the edges of walkways and driveways and fix any cracks.  Remove anything that might make you trip as you walk through a door, such as a raised step or threshold.  Trim any bushes or trees on the path to your home.  Use bright outdoor lighting.  Clear any walking paths of anything that might make someone trip, such as rocks or tools.  Regularly check to see if handrails are loose or broken. Make sure that both sides of any steps have handrails.  Any raised decks and porches should have guardrails on the edges.  Have any leaves, snow, or ice cleared regularly.  Use sand or salt on walking paths during winter.  Clean up any spills in your garage right away. This includes oil or grease spills. What can I do in the bathroom?  Use night lights.  Install grab bars by the toilet and in the tub and shower. Do not use towel bars as grab bars.  Use non-skid mats or decals in the tub or shower.  If you need to sit down in the shower, use a plastic, non-slip stool.  Keep the floor dry. Clean up any water that spills on the floor as soon as it happens.  Remove soap buildup in the tub or shower regularly.  Attach bath mats securely with double-sided non-slip rug tape.  Do not have throw rugs and other things on the floor  that can make you trip. What can I do in the bedroom?  Use night lights.  Make sure that you have a light by your bed that is easy to reach.  Do not use any sheets or blankets that are too big for your bed. They should not hang down onto the floor.  Have a firm chair that has side arms. You can use this for support while you get dressed.  Do not have throw rugs and other things on the floor that can make you trip. What can I do in the kitchen?  Clean up any spills right away.  Avoid walking on wet floors.  Keep items that you use a lot in easy-to-reach places.  If you need to reach something above you, use a strong step stool that has a grab bar.  Keep electrical cords out of the way.  Do not use floor polish or wax that makes floors slippery. If you must use wax, use non-skid floor wax.  Do not have throw rugs and other things on the floor that can make you trip. What can I do with my stairs?  Do not leave any  items on the stairs.  Make sure that there are handrails on both sides of the stairs and use them. Fix handrails that are broken or loose. Make sure that handrails are as long as the stairways.  Check any carpeting to make sure that it is firmly attached to the stairs. Fix any carpet that is loose or worn.  Avoid having throw rugs at the top or bottom of the stairs. If you do have throw rugs, attach them to the floor with carpet tape.  Make sure that you have a light switch at the top of the stairs and the bottom of the stairs. If you do not have them, ask someone to add them for you. What else can I do to help prevent falls?  Wear shoes that:  Do not have high heels.  Have rubber bottoms.  Are comfortable and fit you well.  Are closed at the toe. Do not wear sandals.  If you use a stepladder:  Make sure that it is fully opened. Do not climb a closed stepladder.  Make sure that both sides of the stepladder are locked into place.  Ask someone to hold it  for you, if possible.  Clearly mark and make sure that you can see:  Any grab bars or handrails.  First and last steps.  Where the edge of each step is.  Use tools that help you move around (mobility aids) if they are needed. These include:  Canes.  Walkers.  Scooters.  Crutches.  Turn on the lights when you go into a dark area. Replace any light bulbs as soon as they burn out.  Set up your furniture so you have a clear path. Avoid moving your furniture around.  If any of your floors are uneven, fix them.  If there are any pets around you, be aware of where they are.  Review your medicines with your doctor. Some medicines can make you feel dizzy. This can increase your chance of falling. Ask your doctor what other things that you can do to help prevent falls. This information is not intended to replace advice given to you by your health care provider. Make sure you discuss any questions you have with your health care provider. Document Released: 01/03/2009 Document Revised: 08/15/2015 Document Reviewed: 04/13/2014 Elsevier Interactive Patient Education  2017 [employer].

## 2016-11-02 ENCOUNTER — Encounter: Payer: Self-pay | Admitting: Internal Medicine

## 2016-11-02 ENCOUNTER — Non-Acute Institutional Stay (SKILLED_NURSING_FACILITY): Payer: Medicare Other | Admitting: Internal Medicine

## 2016-11-02 DIAGNOSIS — K219 Gastro-esophageal reflux disease without esophagitis: Secondary | ICD-10-CM | POA: Diagnosis not present

## 2016-11-02 DIAGNOSIS — J441 Chronic obstructive pulmonary disease with (acute) exacerbation: Secondary | ICD-10-CM

## 2016-11-02 DIAGNOSIS — I10 Essential (primary) hypertension: Secondary | ICD-10-CM | POA: Diagnosis not present

## 2016-11-02 DIAGNOSIS — F320 Major depressive disorder, single episode, mild: Secondary | ICD-10-CM | POA: Diagnosis not present

## 2016-11-02 DIAGNOSIS — E039 Hypothyroidism, unspecified: Secondary | ICD-10-CM

## 2016-11-02 LAB — BASIC METABOLIC PANEL
BUN: 32 — AB (ref 4–21)
CREATININE: 1 (ref 0.5–1.1)
Glucose: 90
Potassium: 4.7 (ref 3.4–5.3)
Sodium: 140 (ref 137–147)

## 2016-11-02 NOTE — Progress Notes (Signed)
Location:  [facility] Room Number: 10 Place of Service:  SNF [phone]) Provider:  Anders Simmonds, Karalee Height, MD  Patient Care Team: Blanchie Serve, MD as PCP - General (Internal Medicine) Gaynelle Arabian, MD as Consulting Physician (Orthopedic Surgery) [name], [place] (Skilled Nursing and [place]) [place], Nelda Bucks, NP as Nurse Practitioner (Family Medicine)  Extended Emergency Contact Information Primary Emergency Contact: Johnston,Betsy Address: [place], [place] Phone: [phone] Relation: Daughter Secondary Emergency Contact: Rey,Lindsey  [place] Phone: [phone] Relation: Daughter  Code Status:  DNR Goals of care: Advanced Directive information Advanced Directives 10/26/2016  Does Patient Have a Medical Advance Directive? Yes  Type of Paramedic of [place];Living will;Out of facility DNR (pink MOST or yellow form)  Does patient want to make changes to medical advance directive? No - Patient declined  Copy of [place] in Chart? Yes  Pre-existing out of facility DNR order (yellow form or pink MOST form) Yellow form placed in chart (order not valid for inpatient use);Pink MOST form placed in chart (order not valid for inpatient use)     Chief Complaint  Patient presents with  . Medical Management of Chronic Issues    Routine Visit     HPI:  Pt is a 83 y.o. female seen today for medical management of chronic diseases.  She denies any pain to her left ankle today. Her ankle swelling has subsided. She complaints of nausea and heartburn this am. She moved her bowels yesterday. She has been having increased dyspnea with cough x few days. She coughs out clear phlegm. Denies any other concern. She gets around with her walker. She has mild confusion. She is compliant with her medication. No fall reported.   Past  Medical History:  Diagnosis Date  . Back pain, thoracic 01/12/2014  . Cancer ([place]) 1980   Breast cancer  . Constipation 11/18/2012  . COPD (chronic obstructive pulmonary disease) ([place])   . CVA (cerebral infarction) 06/08/2011   Following abdominal surgery for bowel obstruction   . Depression 12/26/2013  . DVT (deep venous thrombosis) ([place]) 11/18/2012  . Fall 02/07/2013  . GERD (gastroesophageal reflux disease) 03/30/2014  . Granuloma annulare 06/08/2014  . HTN (hypertension) 11/18/2012   01/15/14 Bun/creat 14/0.60   . Hypertension   . Hypothyroidism   . Incisional hernia, without obstruction or gangrene 05/27/2014   Lower abd from previous surgery-2-3 small incisional hernias which are reducible-no incarceration or pain complained.    . Insomnia 11/18/2012  . Left hemiparesis ([place]) 06/08/2014  . Osteoporosis   . Shortness of breath   . Thoracic spine fracture [facility]) 12/18/2013   01/05/14 T 10 Dr. Newman Pies: PCP managing pain.  F/u 2 months(suggested that if the pain is "not too bad" and seems to be improving that she "live with it" and give a change to heal without intervention.  12/18/13 X-ray Thoracic spine: Compression deformity at the level of T10. Comparing back to a chest x-ray from 12/09/2013, this appears new in the interval.       . TIA (transient ischemic attack)   . Ventral hernia 06/08/2014   Past Surgical History:  Procedure Laterality Date  . ABDOMINAL HYSTERECTOMY    . CATARACT EXTRACTION, BILATERAL    . EXPLORATORY LAPAROTOMY W/ BOWEL RESECTION     10/19/12-11/01/12 [facility] for exploratory laparotomty with  extensive lysis of adhesion and segmental small bowel resection with end to end anastomosis per Dr. Isidore Moos 10/22/12  . MASTECTOMY  1980   left side  . TONSILLECTOMY    . TOTAL KNEE ARTHROPLASTY  2010   left knee    Allergies  Allergen Reactions  . Cortisone     Outpatient Encounter Prescriptions as of 11/02/2016  Medication Sig  . acetaminophen (TYLENOL) 325  MG tablet Take 650 mg by mouth every 4 (four) hours as needed for moderate pain. In addition to her scheduled dose  . acetaminophen (TYLENOL) 325 MG tablet Take 650 mg by mouth 2 (two) times daily.  Marland Kitchen albuterol (VENTOLIN HFA) 108 (90 Base) MCG/ACT inhaler Inhale 2 puffs into the lungs daily. Take 2 puffs Every 6 hours as needed for shortness of breath and wheezing  . antiseptic oral rinse (BIOTENE) LIQD 15 mLs by Mouth Rinse route daily as needed for dry mouth.   Marland Kitchen aspirin 81 MG chewable tablet Chew 81 mg by mouth every morning.  . bisacodyl (DULCOLAX) 10 MG suppository Place 10 mg rectally every three (3) days as needed for moderate constipation.  . carvedilol (COREG) 25 MG tablet Take 25 mg by mouth 2 (two) times daily with a meal. Hold if SBP <110 and HR <60/min  . cloNIDine (CATAPRES) 0.1 MG tablet Take 0.1 mg by mouth every morning.   Marland Kitchen Dextran 70-Hypromellose (ARTIFICIAL TEARS) 0.1-0.3 % SOLN Apply 1 drop to eye daily. Both eyes daily and up to four times daily as needed for burning.  Marland Kitchen Dextromethorphan-Guaifenesin (TUSSIN DM) 10-100 MG/5ML liquid Take by mouth. Give 10 ml every 6 hours as needed for cough  . diltiazem (CARDIZEM CD) 120 MG 24 hr capsule Take 120 mg by mouth daily.   . DULoxetine (CYMBALTA) 20 MG capsule Take 40 mg by mouth daily.   . Glycerin, Laxative, (PEDIA-LAX) 2.8 g SUPP Place rectally daily as needed (for constipation).   Marland Kitchen ipratropium-albuterol (DUONEB) 0.5-2.5 (3) MG/3ML SOLN Take 3 mLs by nebulization every 4 (four) hours as needed. For shortness of breath and wheezing.   Marland Kitchen levothyroxine (SYNTHROID, LEVOTHROID) 50 MCG tablet Take 50 mcg by mouth every morning.   Marland Kitchen losartan (COZAAR) 100 MG tablet Take 100 mg by mouth daily.  . Melatonin 3 MG TBDP Take 3 mg by mouth at bedtime.  . Multiple Vitamin (MULTIVITAMIN WITH MINERALS) TABS tablet Take 1 tablet by mouth every morning.  . Nutritional Supplements (RESOURCE 2.0) LIQD Take 120 mLs by mouth 2 (two) times daily.  Marland Kitchen  omeprazole (PRILOSEC) 20 MG capsule Take 20 mg by mouth daily as needed.   . polyethylene glycol (MIRALAX / GLYCOLAX) packet Take 17 g by mouth at bedtime.   . predniSONE (DELTASONE) 5 MG tablet Take 5 mg by mouth daily with breakfast.  . tiotropium (SPIRIVA) 18 MCG inhalation capsule Place 18 mcg into inhaler and inhale daily. Inhale by taking 2 separate inhalations  . traMADol (ULTRAM) 50 MG tablet Take 50 mg by mouth every 6 (six) hours as needed.   No facility-administered encounter medications on file as of 11/02/2016.     Review of Systems  Constitutional: Negative for appetite change, chills, fatigue and fever.  HENT: Negative for congestion, mouth sores, sinus pressure, sore throat and trouble swallowing.   Eyes: Negative for pain.  Respiratory: Positive for cough and shortness of breath. Negative for wheezing.        Has chronic dyspnea with minimal exertion   Cardiovascular: Positive for leg swelling.  Negative for chest pain and palpitations.  Gastrointestinal: Positive for nausea. Negative for abdominal pain, blood in stool, diarrhea and vomiting.       Had bowel movement yesterday  Genitourinary: Negative for dysuria, frequency and hematuria.  Musculoskeletal: Positive for back pain. Negative for joint swelling and neck pain.  Skin: Negative for rash and wound.  Neurological: Negative for dizziness and light-headedness.  Psychiatric/Behavioral: Positive for confusion. Negative for behavioral problems.    Immunization History  Administered Date(s) Administered  . Influenza,inj,Quad PF,36+ Mos 12/19/2013  . Influenza-Unspecified 01/21/2013, 12/28/2014, 01/03/2016  . PPD Test 01/02/2014  . Pneumococcal Conjugate-13 07/25/2016  . Pneumococcal Polysaccharide-23 11/24/1999  . Td 12/28/1994  . Tdap 04/08/2013   Pertinent  Health Maintenance Due  Topic Date Due  . INFLUENZA VACCINE  10/21/2016  . DEXA SCAN  Completed  . PNA vac Low Risk Adult  Completed   Fall Risk   10/26/2016 12/03/2014 06/08/2014  Falls in the past year? Yes Yes No  Number falls in past yr: 2 or more 1 -  Injury with Fall? No No -  Risk for fall due to : - History of fall(s);Impaired balance/gait;Impaired mobility Impaired balance/gait  Follow up - Falls evaluation completed -   Functional Status Survey:    Vitals:   11/02/16 1206  BP: (!) 160/80  Pulse: 72  Resp: 20  Temp: (!) 97 F (36.1 C)  TempSrc: Oral  SpO2: 93%  Weight: 125 lb (56.7 kg)  Height: '5\' 2"'  (1.575 m)   Body mass index is 22.86 kg/m. Physical Exam  Constitutional: She is oriented to person, place, and time. She appears well-developed and well-nourished. No distress.  HENT:  Head: Normocephalic and atraumatic.  Mouth/Throat: Oropharynx is clear and moist.  Eyes: Pupils are equal, round, and reactive to light. Conjunctivae are normal.  Neck: Normal range of motion. Neck supple.  Cardiovascular: Normal rate and regular rhythm.   Pulmonary/Chest: Effort normal. She has no wheezes. She has rales.  Decreased air entry  Abdominal: Soft. Bowel sounds are normal. There is no tenderness. There is no guarding.  No epigastric tenderness. Reducible lower abdominal hernia  Musculoskeletal: She exhibits edema.  Trace ankle edema left >right, Arthritis changes to her fingers, Uses rollator walker, chronic left sided weakness  Lymphadenopathy:    She has no cervical adenopathy.  Neurological: She is alert and oriented to person, place, and time.  Skin: Skin is warm and dry. She is not diaphoretic.  Old surgical scar to left knee, resolving bruise to left knee. Left mastectomy.   Psychiatric: She has a normal mood and affect.    Labs reviewed:  Recent Labs  01/08/16 05/07/16 11/02/16  NA 139 141 140  K 4.5 4.6 4.7  BUN 29* 29* 32*  CREATININE 0.6 0.8 1.0    Recent Labs  01/08/16 05/07/16  AST 16 18  ALT 10 11  ALKPHOS 74 81    Recent Labs  01/08/16 04/13/16 05/07/16  WBC 7.9 7.2 7.2  HGB 14.7 19.4*  17.8*  HCT 45 58* 53*  PLT 428* 373 321   Lab Results  Component Value Date   TSH 2.30 05/07/2016   No results found for: HGBA1C Lab Results  Component Value Date   CHOL 148 10/01/2016   HDL 69 10/01/2016   LDLCALC 64 10/01/2016   TRIG 73 10/01/2016  11/02/16 eGFR 50    Significant Diagnostic Results in last 30 days:  No results found.  Assessment/Plan  COPD exacerbation Increased cough with  more phlegm, decreased air entry and increased dyspnea raise concern for copd exacerbation. No wheeze on exam. Currently on prednisone 5 mg daily with spiriva daily, albuterol MDI 2 puff q6h prn and duoneb q4h prn. Also on tussin DM q6h prn cough. Change her duoneb to tid scheduled and tussin dm to bid x 1 week. Monitor clinically. If worsens, to re-evaluate.   gerd With nausea present. Currently on omeprazole 20 mg daily as needed. Change this to daily scheduled for now and reassess. Has failed attempt of dose reduction.   Hypothyroidism  Lab Results  Component Value Date   TSH 2.30 05/07/2016   check TSH next lab. continue levothyroxine current regimen  Chronic depression Mood appears stable. Has chronic pain and is controlled at present. Decrease cymbalta to 30 mg daily in attempt for GDR and monitor.    Family/ staff Communication: reviewed care plan with pt and charge nurse  Labs/tests ordered:  None   Blanchie Serve, MD Internal Medicine [place], [place] [address] Cell Phone (Monday-Friday 8 am - 5 pm): [phone] On Call: [phone] and follow prompts after 5 pm and on weekends Office Phone: [phone] Office Fax: [phone]

## 2016-11-05 DIAGNOSIS — E039 Hypothyroidism, unspecified: Secondary | ICD-10-CM | POA: Diagnosis not present

## 2016-11-05 LAB — TSH: TSH: 1.91 (ref 0.41–5.90)

## 2016-11-06 ENCOUNTER — Other Ambulatory Visit (HOSPITAL_BASED_OUTPATIENT_CLINIC_OR_DEPARTMENT_OTHER): Payer: Medicare Other

## 2016-11-06 ENCOUNTER — Telehealth: Payer: Self-pay | Admitting: Oncology

## 2016-11-06 ENCOUNTER — Ambulatory Visit (HOSPITAL_BASED_OUTPATIENT_CLINIC_OR_DEPARTMENT_OTHER): Payer: Medicare Other | Admitting: Oncology

## 2016-11-06 VITALS — BP 167/61 | HR 72 | Temp 98.7°F | Resp 17 | Ht 62.0 in | Wt 129.6 lb

## 2016-11-06 DIAGNOSIS — D751 Secondary polycythemia: Secondary | ICD-10-CM

## 2016-11-06 DIAGNOSIS — J449 Chronic obstructive pulmonary disease, unspecified: Secondary | ICD-10-CM | POA: Diagnosis not present

## 2016-11-06 LAB — CBC WITH DIFFERENTIAL/PLATELET
BASO%: 0.6 % (ref 0.0–2.0)
Basophils Absolute: 0.1 10*3/uL (ref 0.0–0.1)
EOS%: 2 % (ref 0.0–7.0)
Eosinophils Absolute: 0.2 10*3/uL (ref 0.0–0.5)
HCT: 55.1 % — ABNORMAL HIGH (ref 34.8–46.6)
HEMOGLOBIN: 18.2 g/dL — AB (ref 11.6–15.9)
LYMPH%: 9.1 % — ABNORMAL LOW (ref 14.0–49.7)
MCH: 33.8 pg (ref 25.1–34.0)
MCHC: 33 g/dL (ref 31.5–36.0)
MCV: 102.3 fL — ABNORMAL HIGH (ref 79.5–101.0)
MONO#: 0.6 10*3/uL (ref 0.1–0.9)
MONO%: 6.4 % (ref 0.0–14.0)
NEUT%: 81.9 % — ABNORMAL HIGH (ref 38.4–76.8)
NEUTROS ABS: 7.7 10*3/uL — AB (ref 1.5–6.5)
PLATELETS: 363 10*3/uL (ref 145–400)
RBC: 5.39 10*6/uL (ref 3.70–5.45)
RDW: 16.1 % — AB (ref 11.2–14.5)
WBC: 9.5 10*3/uL (ref 3.9–10.3)
lymph#: 0.9 10*3/uL (ref 0.9–3.3)

## 2016-11-06 NOTE — Progress Notes (Signed)
Hematology and Oncology Follow Up Visit  Dana George [id number] January 21, 1924 83 y.o. 11/06/2016 1:08 PM Dana George, MDGreen, Dana Spare, MD   Principle Diagnosis: 83 year old woman with polycythemia appears to be secondary causes related to COPD and diuretics. This was diagnosed dating back to 97.  Current therapy: Observation and surveillance.  Interim History: Dana George presents today for a follow-up visit with her daughter. She is a pleasant woman I saw in consultation in February 2018 for fluctuating polycythemia. Since her last visit, she reports no major changes in her health. She continues to reside at a skilled nursing facility with limited ambulation. She denied any hospitalization or illnesses. Her appetite and performance status is unchanged. She denied any neurological deficits or headaches. She denied any thrombosis or bleeding episodes.  She does not report any blurry vision, syncope or seizures. She does not report any fevers, chills or sweats. She does not report any cough, wheezing or hemoptysis. She does not report any chest pain, palpitation, orthopnea or leg edema. She does not report any nausea, vomiting or abdominal pain. She does not report any frequency urgency or hesitancy. She does not report any skeletal complaints of arthralgias or myalgias. Remaining review of systems unremarkable.    Medications: I have reviewed the patient's current medications.  Current Outpatient Prescriptions  Medication Sig Dispense Refill  . acetaminophen (TYLENOL) 325 MG tablet Take 650 mg by mouth every 4 (four) hours as needed for moderate pain. In addition to her scheduled dose    . acetaminophen (TYLENOL) 325 MG tablet Take 650 mg by mouth 2 (two) times daily.    Marland Kitchen albuterol (VENTOLIN HFA) 108 (90 Base) MCG/ACT inhaler Inhale 2 puffs into the lungs daily. Take 2 puffs Every 6 hours as needed for shortness of breath and wheezing    . antiseptic oral rinse (BIOTENE) LIQD 15 mLs by  Mouth Rinse route daily as needed for dry mouth.     Marland Kitchen aspirin 81 MG chewable tablet Chew 81 mg by mouth every morning.    . bisacodyl (DULCOLAX) 10 MG suppository Place 10 mg rectally every three (3) days as needed for moderate constipation.    . carvedilol (COREG) 25 MG tablet Take 25 mg by mouth 2 (two) times daily with a meal. Hold if SBP <110 and HR <60/min    . cloNIDine (CATAPRES) 0.1 MG tablet Take 0.1 mg by mouth every morning.     Marland Kitchen Dextran 70-Hypromellose (ARTIFICIAL TEARS) 0.1-0.3 % SOLN Apply 1 drop to eye daily. Both eyes daily and up to four times daily as needed for burning.    Marland Kitchen Dextromethorphan-Guaifenesin (TUSSIN DM) 10-100 MG/5ML liquid Take by mouth. Give 10 ml every 6 hours as needed for cough    . diltiazem (CARDIZEM CD) 120 MG 24 hr capsule Take 120 mg by mouth daily.     . DULoxetine (CYMBALTA) 20 MG capsule Take 40 mg by mouth daily.     . Glycerin, Laxative, (PEDIA-LAX) 2.8 g SUPP Place rectally daily as needed (for constipation).     Marland Kitchen ipratropium-albuterol (DUONEB) 0.5-2.5 (3) MG/3ML SOLN Take 3 mLs by nebulization every 4 (four) hours as needed. For shortness of breath and wheezing.     Marland Kitchen levothyroxine (SYNTHROID, LEVOTHROID) 50 MCG tablet Take 50 mcg by mouth every morning.     Marland Kitchen losartan (COZAAR) 100 MG tablet Take 100 mg by mouth daily.    . Melatonin 3 MG TBDP Take 3 mg by mouth at bedtime.    Marland Kitchen  Multiple Vitamin (MULTIVITAMIN WITH MINERALS) TABS tablet Take 1 tablet by mouth every morning.    . Nutritional Supplements (RESOURCE 2.0) LIQD Take 120 mLs by mouth 2 (two) times daily.    Marland Kitchen omeprazole (PRILOSEC) 20 MG capsule Take 20 mg by mouth daily as needed.     . polyethylene glycol (MIRALAX / GLYCOLAX) packet Take 17 g by mouth at bedtime.     . predniSONE (DELTASONE) 5 MG tablet Take 5 mg by mouth daily with breakfast.    . tiotropium (SPIRIVA) 18 MCG inhalation capsule Place 18 mcg into inhaler and inhale daily. Inhale by taking 2 separate inhalations    .  traMADol (ULTRAM) 50 MG tablet Take 50 mg by mouth every 6 (six) hours as needed.     No current facility-administered medications for this visit.      Allergies:  Allergies  Allergen Reactions  . Cortisone     Past Medical History, Surgical history, Social history, and Family History were reviewed and updated.  e. Physical Exam: Blood pressure (!) 167/61, pulse 72, temperature 98.7 F (37.1 C), temperature source Oral, resp. rate 17, height 5\' 2"  (1.575 m), weight 129 lb 9.6 oz (58.8 kg), SpO2 95 %. ECOG: 2 General appearance: alert and cooperative Head: Normocephalic, without obvious abnormality Neck: no adenopathy Lymph nodes: Cervical, supraclavicular, and axillary nodes normal. Heart:regular rate and rhythm, S1, S2 normal, no murmur, click, rub or gallop Lung:chest clear, no wheezing, rales, normal symmetric air entry Abdomin: soft, non-tender, without masses or organomegaly EXT:no erythema, induration, or nodules   Lab Results: Lab Results  Component Value Date   WBC 9.5 11/06/2016   HGB 18.2 (H) 11/06/2016   HCT 55.1 (H) 11/06/2016   MCV 102.3 (H) 11/06/2016   PLT 363 11/06/2016     Chemistry      Component Value Date/Time   NA 140 11/02/2016   K 4.7 11/02/2016   CL 102 12/19/2013 0404   CO2 27 12/19/2013 0404   BUN 32 (A) 11/02/2016   CREATININE 1.0 11/02/2016   CREATININE 0.74 12/19/2013 0404   GLU 90 11/02/2016      Component Value Date/Time   CALCIUM 9.2 12/19/2013 0404   ALKPHOS 81 05/07/2016   AST 18 05/07/2016   ALT 11 05/07/2016   BILITOT 0.5 12/18/2013 1621       Impression and Plan:  83 year old woman with the following issues:  1. Polycythemia with a hemoglobin of fluctuated as high as 19 and the normal range for the last 3 years. She had normal white cell count and normal platelet count. The differential diagnosis was discussed with the patient and her daughter. She likely has secondary polycythemia rather than a myeloproliferative  disorder. Causes of her polycythemia could be related to medication versus COPD.  Polycythemia vera is also a consideration but considered less likely. Her workup is ongoing.  For management standpoint, we have discussed the risks and benefits associated with the phlebotomy and given the frail status I do not recommend any phlebotomy at this time. If she develops future thrombosis episodes, this can be reconsidered. I recommended continuing aspirin as a preventive measures.  2. Follow-up: In happy to see her in the future as needed.    Zola Button, MD 8/17/20181:08 PM

## 2016-11-06 NOTE — Telephone Encounter (Signed)
No los 8/17 

## 2016-11-18 ENCOUNTER — Encounter: Payer: Self-pay | Admitting: Family

## 2016-11-18 ENCOUNTER — Non-Acute Institutional Stay (SKILLED_NURSING_FACILITY): Payer: Medicare Other | Admitting: Family

## 2016-11-18 DIAGNOSIS — G8929 Other chronic pain: Secondary | ICD-10-CM

## 2016-11-18 DIAGNOSIS — M545 Low back pain, unspecified: Secondary | ICD-10-CM

## 2016-11-18 DIAGNOSIS — I10 Essential (primary) hypertension: Secondary | ICD-10-CM

## 2016-11-18 MED ORDER — CAMPHOR-MENTHOL-METHYL SAL 1.2-5.7-6.3 % EX PTCH: 1.0000 | MEDICATED_PATCH | Freq: Two times a day (BID) | CUTANEOUS | Status: DC

## 2016-11-18 MED ORDER — ACETAMINOPHEN 500 MG PO TABS: 500.0000 mg | ORAL_TABLET | Freq: Three times a day (TID) | ORAL | 0 refills | Status: DC

## 2016-11-18 NOTE — Progress Notes (Signed)
Location:  [facility] Room Number: 10 Place of Service:  SNF (31) Provider: Dinah [name] FNP-C  Blanchie Serve, MD  Patient Care Team: Blanchie Serve, MD as PCP - General (Internal Medicine) Gaynelle Arabian, MD as Consulting Physician (Orthopedic Surgery) [name], [place] (Skilled Nursing and [place]) [name], Nelda Bucks, NP as Nurse Practitioner (Family Medicine)  Extended Emergency Contact Information Primary Emergency Contact: Johnston,Betsy Address: [place], [place] Phone: [phone] Relation: Daughter Secondary Emergency Contact: Rey,Lindsey  [place] Phone: [phone] Relation: Daughter  Code Status:  DNR Goals of care: Advanced Directive information Advanced Directives 11/18/2016  Does Patient Have a Medical Advance Directive? Yes  Type of Advance Directive Out of facility DNR (pink MOST or yellow form);Living will;Healthcare Power of Attorney  Does patient want to make changes to medical advance directive? -  Copy of [place] in Chart? Yes  Pre-existing out of facility DNR order (yellow form or pink MOST form) Yellow form placed in chart (order not valid for inpatient use);Pink MOST form placed in chart (order not valid for inpatient use)     Chief Complaint  Patient presents with  . Acute Visit    back pain, would like to discuss laser surgery    HPI:  Pt is a 83 y.o. female seen today at [facility] for an acute visit for evaluation of back pain and would like to discuss with provider laser surgery for her back pain.she has a medical history of HTN,CVA with left hemiparesis, COPD, depression, hx breast cancer, chronic lower back pain among other conditions. She is seen in her room today. She complains of lower back pain rating 7/10 on scale though states not too bad compared to previous years. She was previously on lidocaine  patch but requested to be discontinued. She thinks patch was relieving pain and would like to start on another patch. She request referral to a specialist for laser surgery.she is currently on palliative care. Patient's daughter Brown Human POA contacted at telephone # [phone] in presence of facility Nurse. Patient's daughter states patient has been to several specialist and was told she is not a surgical candidate. She would like to continue with pain management for now. Discussed with patient and patient's daughter plan to schedule extra strength tylenol 500 mg tablet three times daily, restart Salon pas patch for pain and continue on tramadol 50 mg Tablet every 6 hours as needed for pain. Patient's daughter in agreement with plan.     Past Medical History:  Diagnosis Date  . Back pain, thoracic 01/12/2014  . Cancer ([place]) 1980   Breast cancer  . Constipation 11/18/2012  . COPD (chronic obstructive pulmonary disease) ([place])   . CVA (cerebral infarction) 06/08/2011   Following abdominal surgery for bowel obstruction   . Depression 12/26/2013  . DVT (deep venous thrombosis) ([place]) 11/18/2012  . Fall 02/07/2013  . GERD (gastroesophageal reflux disease) 03/30/2014  . Granuloma annulare 06/08/2014  . HTN (hypertension) 11/18/2012   01/15/14 Bun/creat 14/0.60   . Hypertension   . Hypothyroidism   . Incisional hernia, without obstruction or gangrene 05/27/2014   Lower abd from previous surgery-2-3 small incisional hernias which are reducible-no incarceration or pain complained.    . Insomnia 11/18/2012  . Left hemiparesis ([place]) 06/08/2014  . Osteoporosis   . Shortness of breath   . Thoracic spine  fracture [facility]) 12/18/2013   01/05/14 T 10 Dr. Newman Pies: PCP managing pain.  F/u 2 months(suggested that if the pain is "not too bad" and seems to be improving that she "live with it" and give a change to heal without intervention.  12/18/13 X-ray Thoracic spine: Compression deformity at the level of T10.  Comparing back to a chest x-ray from 12/09/2013, this appears new in the interval.       . TIA (transient ischemic attack)   . Ventral hernia 06/08/2014   Past Surgical History:  Procedure Laterality Date  . ABDOMINAL HYSTERECTOMY    . CATARACT EXTRACTION, BILATERAL    . EXPLORATORY LAPAROTOMY W/ BOWEL RESECTION     10/19/12-11/01/12 [facility] for exploratory laparotomty with extensive lysis of adhesion and segmental small bowel resection with end to end anastomosis per Dr. Isidore Moos 10/22/12  . MASTECTOMY  1980   left side  . TONSILLECTOMY    . TOTAL KNEE ARTHROPLASTY  2010   left knee    Allergies  Allergen Reactions  . Cortisone     Allergies as of 11/18/2016      Reactions   Cortisone       Medication List       Accurate as of 11/18/16  1:39 PM. Always use your most recent med list.          acetaminophen 325 MG tablet Commonly known as:  TYLENOL Take 650 mg by mouth every 4 (four) hours as needed for moderate pain. In addition to her scheduled dose   acetaminophen 325 MG tablet Commonly known as:  TYLENOL Take 650 mg by mouth 2 (two) times daily.   antiseptic oral rinse Liqd 15 mLs by Mouth Rinse route daily as needed for dry mouth.   ARTIFICIAL TEARS 0.1-0.3 % Soln Generic drug:  Dextran 70-Hypromellose Apply 1 drop to eye daily. Both eyes daily and up to four times daily as needed for burning.   aspirin 81 MG chewable tablet Chew 81 mg by mouth every morning.   bisacodyl 10 MG suppository Commonly known as:  DULCOLAX Place 10 mg rectally every three (3) days as needed for moderate constipation.   carvedilol 25 MG tablet Commonly known as:  COREG Take 25 mg by mouth 2 (two) times daily with a meal. Hold if SBP <110 and HR <60/min   cloNIDine 0.1 MG tablet Commonly known as:  CATAPRES Take 0.1 mg by mouth every morning.   diltiazem 120 MG 24 hr capsule Commonly known as:  CARDIZEM CD Take 120 mg by mouth daily.   DULoxetine 30 MG capsule Commonly  known as:  CYMBALTA Take 30 mg by mouth daily.   ipratropium-albuterol 0.5-2.5 (3) MG/3ML Soln Commonly known as:  DUONEB Take 3 mLs by nebulization every 4 (four) hours as needed. For shortness of breath and wheezing.   levothyroxine 50 MCG tablet Commonly known as:  SYNTHROID, LEVOTHROID Take 50 mcg by mouth every morning.   losartan 100 MG tablet Commonly known as:  COZAAR Take 100 mg by mouth daily.   Melatonin 3 MG Tbdp Take 3 mg by mouth at bedtime.   multivitamin with minerals Tabs tablet Take 1 tablet by mouth every morning.   omeprazole 20 MG capsule Commonly known as:  PRILOSEC Take 20 mg by mouth daily as needed.   PEDIA-LAX 2.8 g Supp Generic drug:  Glycerin (Laxative) Place rectally daily as needed (for constipation).   polyethylene glycol packet Commonly known as:  MIRALAX / GLYCOLAX Take 17  g by mouth at bedtime.   predniSONE 5 MG tablet Commonly known as:  DELTASONE Take 5 mg by mouth daily with breakfast.   RESOURCE 2.0 Liqd Take 120 mLs by mouth 2 (two) times daily.   tiotropium 18 MCG inhalation capsule Commonly known as:  SPIRIVA Place 18 mcg into inhaler and inhale daily. Inhale by taking 2 separate inhalations   traMADol 50 MG tablet Commonly known as:  ULTRAM Take 50 mg by mouth every 6 (six) hours as needed.   TUSSIN DM 10-100 MG/5ML liquid Generic drug:  Dextromethorphan-Guaifenesin Take by mouth. Give 10 ml every 6 hours as needed for cough   VENTOLIN HFA 108 (90 Base) MCG/ACT inhaler Generic drug:  albuterol Inhale 2 puffs into the lungs daily. Take 2 puffs Every 6 hours as needed for shortness of breath and wheezing       Review of Systems  Constitutional: Negative for activity change, appetite change, chills, fatigue and fever.  HENT: Negative for congestion, rhinorrhea, sinus pain, sinus pressure, sneezing and sore throat.   Eyes: Negative for pain, discharge, redness and itching.  Respiratory: Negative for cough, shortness  of breath and wheezing.   Cardiovascular: Positive for leg swelling. Negative for chest pain and palpitations.       Left leg swelling Hx DVT   Gastrointestinal: Negative for abdominal distention, abdominal pain, constipation, diarrhea, nausea and vomiting.  Musculoskeletal: Positive for back pain and gait problem.  Skin: Negative for color change, pallor and rash.  Neurological: Negative for dizziness, seizures, numbness and headaches.  Psychiatric/Behavioral: Negative for agitation, confusion, hallucinations and sleep disturbance. The patient is not nervous/anxious.     Immunization History  Administered Date(s) Administered  . Influenza,inj,Quad PF,6+ Mos 12/19/2013  . Influenza-Unspecified 01/21/2013, 12/28/2014, 01/03/2016  . PPD Test 12/20/2013, 01/02/2014  . Pneumococcal Conjugate-13 07/25/2016  . Pneumococcal Polysaccharide-23 11/24/1999  . Td 12/28/1994  . Tdap 04/08/2013   Pertinent  Health Maintenance Due  Topic Date Due  . INFLUENZA VACCINE  10/21/2016  . DEXA SCAN  Completed  . PNA vac Low Risk Adult  Completed   Fall Risk  10/26/2016 12/03/2014 06/08/2014  Falls in the past year? Yes Yes No  Number falls in past yr: 2 or more 1 -  Injury with Fall? No No -  Risk for fall due to : - History of fall(s);Impaired balance/gait;Impaired mobility Impaired balance/gait  Follow up - Falls evaluation completed -    Vitals:   11/18/16 1113  BP: (!) 184/80  Pulse: 72  Resp: 18  Temp: (!) 97.5 F (36.4 C)  SpO2: 97%  Weight: 125 lb (56.7 kg)  Height: 5\' 2"  (1.575 m)   Body mass index is 22.86 kg/m. Physical Exam  Constitutional: She is oriented to person, place, and time. She appears well-developed and well-nourished.  Elderly in no acute distress   HENT:  Head: Normocephalic.  Mouth/Throat: Oropharynx is clear and moist. No oropharyngeal exudate.  Eyes: Pupils are equal, round, and reactive to light. Conjunctivae and EOM are normal. Right eye exhibits no discharge.  Left eye exhibits no discharge. No scleral icterus.  Neck: Normal range of motion. No JVD present. No thyromegaly present.  Cardiovascular: Normal rate, regular rhythm, normal heart sounds and intact distal pulses.  Exam reveals no gallop and no friction rub.   No murmur heard. Pulmonary/Chest: Effort normal and breath sounds normal. No respiratory distress. She has no wheezes. She has no rales.  Abdominal: Soft. Bowel sounds are normal. She exhibits no distension.  There is no tenderness. There is no rebound and no guarding.  Musculoskeletal:  Moves x 4 extremities.  Unsteady gait uses Rolator and wheelchair on long distance.  Left leg chronic non-pitting edema.  Left lower back spine non tender to palpation. Thoracic spinal concave shape noted.   Lymphadenopathy:    She has no cervical adenopathy.  Neurological: She is oriented to person, place, and time. Coordination normal.  Skin: Skin is warm and dry. No rash noted. No erythema. No pallor.  Psychiatric: She has a normal mood and affect.   Labs reviewed:  Recent Labs  01/08/16 05/07/16 11/02/16  NA 139 141 140  K 4.5 4.6 4.7  BUN 29* 29* 32*  CREATININE 0.6 0.8 1.0    Recent Labs  01/08/16 05/07/16  AST 16 18  ALT 10 11  ALKPHOS 74 81    Recent Labs  04/13/16 05/07/16 11/06/16 1238  WBC 7.2 7.2 9.5  NEUTROABS  --   --  7.7*  HGB 19.4* 17.8* 18.2*  HCT 58* 53* 55.1*  MCV  --   --  102.3*  PLT 373 321 363   Lab Results  Component Value Date   TSH 2.30 05/07/2016   No results found for: HGBA1C Lab Results  Component Value Date   CHOL 148 10/01/2016   HDL 69 10/01/2016   LDLCALC 64 10/01/2016   TRIG 73 10/01/2016    Significant Diagnostic Results in last 30 days:  No results found.  Assessment/Plan  1. Chronic left-sided low back pain without sciatica Rates pain 7/10 on scale though states has improved over the years. Request referral to specialist for laser surgery.Discussed with patient's daughter  patient's request Brown Human POA contacted at telephone # [phone] in presence of facility Nurse. Patient's daughter states patient has been to several specialist and was told she is not a surgical candidate.Notified patient that not a surgical candidate.Discontinue current tylenol orders then start extra strength tylenol 500 mg tablet three times daily, restart Salon pas pain relief patch apply topically every 12 hours for pain and continue on tramadol 50 mg Tablet every 6 hours as needed for pain.Will schedule tramadol dose if no relief.   2. HTN Has had sporadic SBP > 160-180 but overall facility blood pressure log readings ranging in the 120's/60's-140's/80. Suspect elevated B/p possible due to pain. Will continue Losartan 100 mg daily,coreg 25 mg Tablet daily, clonidine 0.1 mg tablet and diltiazem 120 mg 24 Hr capsule daily.continue to monitor for now.    Family/ staff Communication: Reviewed plan of care with patient and facility Nurse.   Labs/tests ordered: None   Dinah C [name], NP

## 2016-11-19 ENCOUNTER — Other Ambulatory Visit: Payer: Self-pay | Admitting: *Deleted

## 2016-11-25 ENCOUNTER — Non-Acute Institutional Stay (SKILLED_NURSING_FACILITY): Payer: Medicare Other | Admitting: Family

## 2016-11-25 ENCOUNTER — Encounter: Payer: Self-pay | Admitting: Family

## 2016-11-25 DIAGNOSIS — R059 Cough, unspecified: Secondary | ICD-10-CM

## 2016-11-25 DIAGNOSIS — F329 Major depressive disorder, single episode, unspecified: Secondary | ICD-10-CM

## 2016-11-25 DIAGNOSIS — F32A Depression, unspecified: Secondary | ICD-10-CM

## 2016-11-25 DIAGNOSIS — R05 Cough: Secondary | ICD-10-CM | POA: Diagnosis not present

## 2016-11-25 DIAGNOSIS — E039 Hypothyroidism, unspecified: Secondary | ICD-10-CM

## 2016-11-25 DIAGNOSIS — R5383 Other fatigue: Secondary | ICD-10-CM | POA: Diagnosis not present

## 2016-11-25 NOTE — Progress Notes (Signed)
Location:  [place] Room Number: 10 Place of Service:  SNF (31) Provider: Ximenna Fonseca FNP-C  Blanchie Serve, MD  Patient Care Team: Blanchie Serve, MD as PCP - General (Internal Medicine) Gaynelle Arabian, MD as Consulting Physician (Orthopedic Surgery) [name], [place] (Skilled Nursing and [place]) Stewart Pimenta, Nelda Bucks, NP as Nurse Practitioner (Family Medicine)  Extended Emergency Contact Information Primary Emergency Contact: Johnston,Betsy Address: [place], [place] Phone: [phone] Relation: Daughter Secondary Emergency Contact: Rey,Lindsey  [place] Phone: [phone] Relation: Daughter  Code Status:  DNR Goals of care: Advanced Directive information Advanced Directives 11/25/2016  Does Patient Have a Medical Advance Directive? Yes  Type of Advance Directive Living will;Out of facility DNR (pink MOST or yellow form);Healthcare Power of Attorney  Does patient want to make changes to medical advance directive? -  Copy of [place] in Chart? Yes  Pre-existing out of facility DNR order (yellow form or pink MOST form) Yellow form placed in chart (order not valid for inpatient use);Pink MOST form placed in chart (order not valid for inpatient use)     Chief Complaint  Patient presents with  . Acute Visit    doesn't feel "right" per patient    HPI:  Pt is a 83 y.o. female seen today at [facility] for an acute visit for evaluation of not feeling "right". She has a significant medical history of Hypothyroidism, depression,COPD, HTN, CVA among other conditions.She is seen in her room today per facility Nurse request. Nurse reports patient appears at her baseline but states doesn't feel " right". She states has been coughing for the past two weeks with occasional shortness of breath.she is on albuterol inhaler daily for COPD.Also reports  feeling cold.She denies any fever, chills,nausea, diarrhea, vomiting or urinary tract infections symptoms.  Past Medical History:  Diagnosis Date  . Back pain, thoracic 01/12/2014  . Cancer ([place]) 1980   Breast cancer  . Constipation 11/18/2012  . COPD (chronic obstructive pulmonary disease) ([place])   . CVA (cerebral infarction) 06/08/2011   Following abdominal surgery for bowel obstruction   . Depression 12/26/2013  . DVT (deep venous thrombosis) ([place]) 11/18/2012  . Fall 02/07/2013  . GERD (gastroesophageal reflux disease) 03/30/2014  . Granuloma annulare 06/08/2014  . HTN (hypertension) 11/18/2012   01/15/14 Bun/creat 14/0.60   . Hypertension   . Hypothyroidism   . Incisional hernia, without obstruction or gangrene 05/27/2014   Lower abd from previous surgery-2-3 small incisional hernias which are reducible-no incarceration or pain complained.    . Insomnia 11/18/2012  . Left hemiparesis ([place]) 06/08/2014  . Osteoporosis   . Shortness of breath   . Thoracic spine fracture [facility]) 12/18/2013   01/05/14 T 10 Dr. Newman Pies: PCP managing pain.  F/u 2 months(suggested that if the pain is "not too bad" and seems to be improving that she "live with it" and give a change to heal without intervention.  12/18/13 X-ray Thoracic spine: Compression deformity at the level of T10. Comparing back to a chest x-ray from 12/09/2013, this appears new in the interval.       . TIA (transient ischemic attack)   . Ventral hernia 06/08/2014   Past Surgical History:  Procedure Laterality Date  . ABDOMINAL HYSTERECTOMY    . CATARACT EXTRACTION, BILATERAL    . EXPLORATORY LAPAROTOMY W/ BOWEL RESECTION  10/19/12-11/01/12 [place] for exploratory laparotomty with extensive lysis of adhesion and segmental small bowel resection with end to end anastomosis per Dr. Isidore Moos 10/22/12  . MASTECTOMY  1980   left side  . TONSILLECTOMY    . TOTAL KNEE ARTHROPLASTY  2010   left knee    Allergies  Allergen Reactions  .  Cortisone     Outpatient Encounter Prescriptions as of 11/25/2016  Medication Sig  . acetaminophen (TYLENOL) 500 MG tablet Take 1 tablet (500 mg total) by mouth every 8 (eight) hours.  Marland Kitchen albuterol (VENTOLIN HFA) 108 (90 Base) MCG/ACT inhaler Inhale 2 puffs into the lungs daily. Take 2 puffs Every 6 hours as needed for shortness of breath and wheezing  . antiseptic oral rinse (BIOTENE) LIQD 15 mLs by Mouth Rinse route daily as needed for dry mouth.   Marland Kitchen aspirin 81 MG chewable tablet Chew 81 mg by mouth every morning.  . bisacodyl (DULCOLAX) 10 MG suppository Place 10 mg rectally every three (3) days as needed for moderate constipation.  . carvedilol (COREG) 25 MG tablet Take 25 mg by mouth 2 (two) times daily with a meal. Hold if SBP <110 and HR <60/min  . cloNIDine (CATAPRES) 0.1 MG tablet Take 0.1 mg by mouth every morning.   Marland Kitchen Dextran 70-Hypromellose (ARTIFICIAL TEARS) 0.1-0.3 % SOLN Apply 1 drop to eye daily. Both eyes daily and up to four times daily as needed for burning.  Marland Kitchen Dextromethorphan-Guaifenesin (TUSSIN DM) 10-100 MG/5ML liquid Take by mouth. Give 10 ml every 6 hours as needed for cough  . diltiazem (CARDIZEM CD) 120 MG 24 hr capsule Take 120 mg by mouth daily.   . DULoxetine (CYMBALTA) 30 MG capsule Take 30 mg by mouth daily.  . Glycerin, Laxative, (PEDIA-LAX) 2.8 g SUPP Place rectally daily as needed (for constipation).   Marland Kitchen ipratropium-albuterol (DUONEB) 0.5-2.5 (3) MG/3ML SOLN Take 3 mLs by nebulization every 4 (four) hours as needed. For shortness of breath and wheezing.   Marland Kitchen levothyroxine (SYNTHROID, LEVOTHROID) 50 MCG tablet Take 50 mcg by mouth every morning.   . Liniments (SALONPAS PAIN RELIEF PATCH EX) Apply 1 patch topically every 12 (twelve) hours.  Marland Kitchen losartan (COZAAR) 100 MG tablet Take 100 mg by mouth daily.  . Melatonin 3 MG TBDP Take 3 mg by mouth at bedtime.  . Multiple Vitamin (MULTIVITAMIN WITH MINERALS) TABS tablet Take 1 tablet by mouth every morning.  .  Nutritional Supplements (RESOURCE 2.0) LIQD Take 120 mLs by mouth 2 (two) times daily.  Marland Kitchen omeprazole (PRILOSEC) 20 MG capsule Take 20 mg by mouth daily.   . polyethylene glycol (MIRALAX / GLYCOLAX) packet Take 17 g by mouth at bedtime.   . predniSONE (DELTASONE) 5 MG tablet Take 5 mg by mouth daily with breakfast.  . tiotropium (SPIRIVA) 18 MCG inhalation capsule Place 18 mcg into inhaler and inhale daily. Inhale by taking 2 separate inhalations  . traMADol (ULTRAM) 50 MG tablet Take 50 mg by mouth every 6 (six) hours as needed.   No facility-administered encounter medications on file as of 11/25/2016.     Review of Systems  Constitutional: Positive for fatigue. Negative for activity change, appetite change, chills and fever.  HENT: Negative for congestion, rhinorrhea, sinus pain, sinus pressure, sneezing and sore throat.   Eyes: Negative for pain, discharge, redness and itching.  Respiratory: Positive for cough. Negative for chest tightness, shortness of breath and wheezing.   Cardiovascular: Negative for chest pain and palpitations.  Gastrointestinal: Negative for abdominal  distention, abdominal pain, constipation, diarrhea and nausea.  Endocrine: Positive for cold intolerance. Negative for heat intolerance, polydipsia, polyphagia and polyuria.  Genitourinary: Negative for dysuria, flank pain, frequency and urgency.  Musculoskeletal: Positive for arthralgias and gait problem.  Skin: Negative for color change, pallor and rash.  Neurological: Negative for dizziness, syncope, light-headedness and headaches.  Psychiatric/Behavioral: Negative for agitation, confusion, hallucinations and sleep disturbance. The patient is not nervous/anxious.     Immunization History  Administered Date(s) Administered  . Influenza,inj,Quad PF,6+ Mos 12/19/2013  . Influenza-Unspecified 01/21/2013, 12/28/2014, 01/03/2016  . PPD Test 12/20/2013, 01/02/2014  . Pneumococcal Conjugate-13 07/25/2016  . Pneumococcal  Polysaccharide-23 11/24/1999  . Td 12/28/1994  . Tdap 04/08/2013   Pertinent  Health Maintenance Due  Topic Date Due  . INFLUENZA VACCINE  10/21/2016  . DEXA SCAN  Completed  . PNA vac Low Risk Adult  Completed   Fall Risk  10/26/2016 12/03/2014 06/08/2014  Falls in the past year? Yes Yes No  Number falls in past yr: 2 or more 1 -  Injury with Fall? No No -  Risk for fall due to : - History of fall(s);Impaired balance/gait;Impaired mobility Impaired balance/gait  Follow up - Falls evaluation completed -    Vitals:   11/25/16 0922  BP: (!) 145/69  Pulse: 64  Resp: 20  Temp: (!) 96.9 F (36.1 C)  SpO2: 96%  Weight: 125 lb (56.7 kg)  Height: 5\' 2"  (1.575 m)   Body mass index is 22.86 kg/m. Physical Exam  Constitutional: She is oriented to person, place, and time. She appears well-developed and well-nourished.  Elderly in no acute distress  HENT:  Head: Normocephalic.  Right Ear: External ear normal.  Left Ear: External ear normal.  Mouth/Throat: Oropharynx is clear and moist. No oropharyngeal exudate.  Eyes: Pupils are equal, round, and reactive to light. Conjunctivae and EOM are normal. Right eye exhibits no discharge. Left eye exhibits no discharge. No scleral icterus.  Neck: Normal range of motion. No JVD present. No thyromegaly present.  Cardiovascular: Normal rate, regular rhythm, normal heart sounds and intact distal pulses.  Exam reveals no gallop and no friction rub.   No murmur heard. Pulmonary/Chest: No respiratory distress. She has no wheezes. She has no rales.  Abdominal: Soft. Bowel sounds are normal. She exhibits no distension. There is no tenderness. There is no rebound and no guarding.  Musculoskeletal:  Moves x 4 extremities.Unsteady gait uses walker.   Lymphadenopathy:    She has no cervical adenopathy.  Neurological: She is oriented to person, place, and time. Coordination normal.  Skin: Skin is warm and dry. No rash noted. No erythema. No pallor.    Psychiatric: She has a normal mood and affect.   Labs reviewed:  Recent Labs  01/08/16 05/07/16 11/02/16  NA 139 141 140  K 4.5 4.6 4.7  BUN 29* 29* 32*  CREATININE 0.6 0.8 1.0    Recent Labs  01/08/16 05/07/16  AST 16 18  ALT 10 11  ALKPHOS 74 81    Recent Labs  04/13/16 05/07/16 11/06/16 1238  WBC 7.2 7.2 9.5  NEUTROABS  --   --  7.7*  HGB 19.4* 17.8* 18.2*  HCT 58* 53* 55.1*  MCV  --   --  102.3*  PLT 373 321 363   Lab Results  Component Value Date   TSH 1.91 11/05/2016   No results found for: HGBA1C Lab Results  Component Value Date   CHOL 148 10/01/2016   HDL 69  10/01/2016   LDLCALC 64 10/01/2016   TRIG 73 10/01/2016    Significant Diagnostic Results in last 30 days:  No results found.  Assessment/Plan 1. Fatigue, unspecified type Afebrile. Reports feeling tired all the time. VSS. Exam findings negative. Will obtain CBC/diff and CMP 11/26/2016 rule out any infectious and metabolic etiologies.continue to monitor.    2. Hypothyroidism Lab Results  Component Value Date   TSH 1.91 11/05/2016  Continue on levothyroxine 50 mcg tablet daily.   3. Depression Mood stable though could be contributing to her tiredness.Cymbalta dose reduced by MD 11/02/2016 to 30 mg daily.continue current dose will rule out infectious and metabolic etiologies.will adjust Cymbalta if no improvement.     4. Cough  Afebrile.Hx COPD. No recent increase in sputum productions. Continue on Tussin-DM, Spiriva,Prednisone 5 mg tablet, albuterol and Duonebs. Exam findings negative for wheezing or rales. Continue to monitor.     Family/ staff Communication: Reviewed plan of care with patient and facility Nurse.   Labs/tests ordered:  CBC/diff and CMP 11/26/2016   Sandrea Hughs, NP

## 2016-11-26 DIAGNOSIS — R5383 Other fatigue: Secondary | ICD-10-CM | POA: Diagnosis not present

## 2016-11-26 LAB — BASIC METABOLIC PANEL
BUN: 31 — AB (ref 4–21)
CREATININE: 0.8 (ref 0.5–1.1)
Glucose: 94
POTASSIUM: 4.5 (ref 3.4–5.3)
SODIUM: 139 (ref 137–147)

## 2016-11-26 LAB — CBC AND DIFFERENTIAL
HCT: 53 — AB (ref 36–46)
HEMOGLOBIN: 18 — AB (ref 12.0–16.0)
Platelets: 333 (ref 150–399)
WBC: 7.2

## 2016-11-26 LAB — HEPATIC FUNCTION PANEL
ALT: 12 (ref 7–35)
AST: 16 (ref 13–35)
Alkaline Phosphatase: 85 (ref 25–125)
Bilirubin, Total: 0.6

## 2016-12-03 ENCOUNTER — Encounter: Payer: Self-pay | Admitting: Family

## 2016-12-03 ENCOUNTER — Non-Acute Institutional Stay (SKILLED_NURSING_FACILITY): Payer: Medicare Other | Admitting: Family

## 2016-12-03 DIAGNOSIS — Z8673 Personal history of transient ischemic attack (TIA), and cerebral infarction without residual deficits: Secondary | ICD-10-CM

## 2016-12-03 DIAGNOSIS — E039 Hypothyroidism, unspecified: Secondary | ICD-10-CM

## 2016-12-03 DIAGNOSIS — D751 Secondary polycythemia: Secondary | ICD-10-CM | POA: Diagnosis not present

## 2016-12-03 DIAGNOSIS — F32A Depression, unspecified: Secondary | ICD-10-CM

## 2016-12-03 DIAGNOSIS — J449 Chronic obstructive pulmonary disease, unspecified: Secondary | ICD-10-CM

## 2016-12-03 DIAGNOSIS — I10 Essential (primary) hypertension: Secondary | ICD-10-CM | POA: Diagnosis not present

## 2016-12-03 DIAGNOSIS — F329 Major depressive disorder, single episode, unspecified: Secondary | ICD-10-CM

## 2016-12-03 NOTE — Progress Notes (Signed)
Location:  [place] Room Number: 10 Place of Service:  SNF (31) Provider: Elonzo Sopp FNP-C   Blanchie Serve, MD  Patient Care Team: Blanchie Serve, MD as PCP - General (Internal Medicine) Gaynelle Arabian, MD as Consulting Physician (Orthopedic Surgery) [name], [place] (Skilled Nursing and [place]) Clovis Mankins, Nelda Bucks, NP as Nurse Practitioner (Family Medicine)  Extended Emergency Contact Information Primary Emergency Contact: Johnston,Betsy Address: [place], [place] Phone: [phone] Relation: Daughter Secondary Emergency Contact: Rey,Lindsey  [place] Phone: [phone] Relation: Daughter  Code Status: DNR  Goals of care: Advanced Directive information Advanced Directives 12/03/2016  Does Patient Have a Medical Advance Directive? Yes  Type of Advance Directive Out of facility DNR (pink MOST or yellow form);Living will;Healthcare Power of Attorney  Does patient want to make changes to medical advance directive? -  Copy of [place] in Chart? Yes  Pre-existing out of facility DNR order (yellow form or pink MOST form) Yellow form placed in chart (order not valid for inpatient use);Pink MOST form placed in chart (order not valid for inpatient use)     Chief Complaint  Patient presents with  . Medical Management of Chronic Issues    routine visit    HPI:  Pt is a 83 y.o. female seen today [place] for medical management of chronic diseases.she has a medical history of HTN, CVA,COPD,DVT,GERD, Hypothyroidism, chronic back pain among other conditions.she is seen in her room today. She continues to complain of lower back pain requesting laser surgery.Discussed in previous visit with POA  Patient not a surgical candidate per previous visit with Ortho specialist.she takes tylenol for pain and also has tramadol  50 mg tablet every 6 hours  as needed. She has not required her tramadol for pain.Also states continues to have some shortness of breath and wheezing with exertion. No recent weight changes or fall episodes reported.Her blood pressure readings reviewed B/P elevated this visit but log readings within normal range.     Past Medical History:  Diagnosis Date  . Back pain, thoracic 01/12/2014  . Cancer ([place]) 1980   Breast cancer  . Constipation 11/18/2012  . COPD (chronic obstructive pulmonary disease) ([place])   . CVA (cerebral infarction) 06/08/2011   Following abdominal surgery for bowel obstruction   . Depression 12/26/2013  . DVT (deep venous thrombosis) ([place]) 11/18/2012  . Fall 02/07/2013  . GERD (gastroesophageal reflux disease) 03/30/2014  . Granuloma annulare 06/08/2014  . HTN (hypertension) 11/18/2012   01/15/14 Bun/creat 14/0.60   . Hypertension   . Hypothyroidism   . Incisional hernia, without obstruction or gangrene 05/27/2014   Lower abd from previous surgery-2-3 small incisional hernias which are reducible-no incarceration or pain complained.    . Insomnia 11/18/2012  . Left hemiparesis ([place]) 06/08/2014  . Osteoporosis   . Shortness of breath   . Thoracic spine fracture [facility]) 12/18/2013   01/05/14 T 10 Dr. Newman Pies: PCP managing pain.  F/u 2 months(suggested that if the pain is "not too bad" and seems to be improving that she "live with it" and give a change to heal without intervention.  12/18/13 X-ray Thoracic spine: Compression deformity at the level of T10. Comparing back to a chest x-ray from 12/09/2013, this appears new in the interval.       . TIA (transient ischemic attack)   . Ventral  hernia 06/08/2014   Past Surgical History:  Procedure Laterality Date  . ABDOMINAL HYSTERECTOMY    . CATARACT EXTRACTION, BILATERAL    . EXPLORATORY LAPAROTOMY W/ BOWEL RESECTION     10/19/12-11/01/12 [facility] for exploratory laparotomty with extensive lysis of adhesion and segmental small bowel resection with end  to end anastomosis per Dr. Isidore Moos 10/22/12  . MASTECTOMY  1980   left side  . TONSILLECTOMY    . TOTAL KNEE ARTHROPLASTY  2010   left knee    Allergies  Allergen Reactions  . Cortisone     Allergies as of 12/03/2016      Reactions   Cortisone       Medication List       Accurate as of 12/03/16  4:55 PM. Always use your most recent med list.          acetaminophen 500 MG tablet Commonly known as:  TYLENOL Take 1 tablet (500 mg total) by mouth every 8 (eight) hours.   antiseptic oral rinse Liqd 15 mLs by Mouth Rinse route daily as needed for dry mouth.   ARTIFICIAL TEARS 0.1-0.3 % Soln Generic drug:  Dextran 70-Hypromellose Apply 1 drop to eye daily. Both eyes daily and up to four times daily as needed for burning.   aspirin 81 MG chewable tablet Chew 81 mg by mouth every morning.   bisacodyl 10 MG suppository Commonly known as:  DULCOLAX Place 10 mg rectally every three (3) days as needed for moderate constipation.   carvedilol 25 MG tablet Commonly known as:  COREG Take 25 mg by mouth 2 (two) times daily with a meal. Hold if SBP <110 and HR <60/min   cloNIDine 0.1 MG tablet Commonly known as:  CATAPRES Take 0.1 mg by mouth every morning.   diltiazem 120 MG 24 hr capsule Commonly known as:  CARDIZEM CD Take 120 mg by mouth daily.   DULoxetine 30 MG capsule Commonly known as:  CYMBALTA Take 30 mg by mouth daily.   ipratropium-albuterol 0.5-2.5 (3) MG/3ML Soln Commonly known as:  DUONEB Take 3 mLs by nebulization every 4 (four) hours as needed. For shortness of breath and wheezing.   levothyroxine 50 MCG tablet Commonly known as:  SYNTHROID, LEVOTHROID Take 50 mcg by mouth every morning.   losartan 100 MG tablet Commonly known as:  COZAAR Take 100 mg by mouth daily.   Melatonin 3 MG Tbdp Take 3 mg by mouth at bedtime.   multivitamin with minerals Tabs tablet Take 1 tablet by mouth every morning.   omeprazole 20 MG capsule Commonly known as:   PRILOSEC Take 20 mg by mouth daily.   PEDIA-LAX 2.8 g Supp Generic drug:  Glycerin (Laxative) Place rectally daily as needed (for constipation).   polyethylene glycol packet Commonly known as:  MIRALAX / GLYCOLAX Take 17 g by mouth at bedtime.   predniSONE 5 MG tablet Commonly known as:  DELTASONE Take 5 mg by mouth daily with breakfast.   RESOURCE 2.0 Liqd Take 120 mLs by mouth 2 (two) times daily.   tiotropium 18 MCG inhalation capsule Commonly known as:  SPIRIVA Place 18 mcg into inhaler and inhale daily. Inhale by taking 2 separate inhalations   traMADol 50 MG tablet Commonly known as:  ULTRAM Take 50 mg by mouth every 6 (six) hours as needed.   TUSSIN DM 10-100 MG/5ML liquid Generic drug:  Dextromethorphan-Guaifenesin Take by mouth. Give 10 ml every 6 hours as needed for cough   VENTOLIN HFA 108 (  90 Base) MCG/ACT inhaler Generic drug:  albuterol Inhale 2 puffs into the lungs daily. Take 2 puffs Every 6 hours as needed for shortness of breath and wheezing            Discharge Care Instructions        Start     Ordered   12/03/16 0000  Hemoglobin A1c    Comments:  This external order was created through the Results Console.    12/03/16 1655      Review of Systems  Constitutional: Negative for activity change, appetite change, chills and fever.       Chronic fatigue   HENT: Negative for congestion, rhinorrhea, sinus pain, sinus pressure, sneezing, sore throat and trouble swallowing.   Eyes: Negative for discharge, redness, itching and visual disturbance.  Respiratory: Negative for cough and chest tightness.        Occasional shortness of breath and wheezing   Cardiovascular: Negative for chest pain and palpitations.       Left leg chronic swelling   Gastrointestinal: Negative for abdominal distention, abdominal pain, constipation, diarrhea, nausea and vomiting.  Endocrine: Negative for cold intolerance, heat intolerance, polydipsia, polyphagia and  polyuria.  Genitourinary: Negative for dysuria, flank pain, frequency and urgency.  Musculoskeletal: Positive for back pain and gait problem.  Skin: Negative for color change, pallor, rash and wound.  Neurological: Negative for dizziness, syncope, light-headedness and headaches.        Hx of CVA with Left leg weakness  Hematological: Does not bruise/bleed easily.  Psychiatric/Behavioral: Positive for confusion. Negative for agitation, hallucinations and sleep disturbance. The patient is not nervous/anxious.     Immunization History  Administered Date(s) Administered  . Influenza,inj,Quad PF,6+ Mos 12/19/2013  . Influenza-Unspecified 01/21/2013, 12/28/2014, 01/03/2016  . PPD Test 12/20/2013, 01/02/2014  . Pneumococcal Conjugate-13 07/25/2016  . Pneumococcal Polysaccharide-23 11/24/1999  . Td 12/28/1994  . Tdap 04/08/2013   Pertinent  Health Maintenance Due  Topic Date Due  . INFLUENZA VACCINE  10/21/2016  . DEXA SCAN  Completed  . PNA vac Low Risk Adult  Completed   Fall Risk  10/26/2016 12/03/2014 06/08/2014  Falls in the past year? Yes Yes No  Number falls in past yr: 2 or more 1 -  Injury with Fall? No No -  Risk for fall due to : - History of fall(s);Impaired balance/gait;Impaired mobility Impaired balance/gait  Follow up - Falls evaluation completed -    Vitals:   12/03/16 1044  BP: (!) 145/88  Pulse: 72  Resp: 18  Temp: (!) 97 F (36.1 C)  SpO2: 96%  Weight: 127 lb (57.6 kg)  Height: 5\' 2"  (1.575 m)   Body mass index is 23.23 kg/m. Physical Exam  Constitutional: She appears well-developed and well-nourished. No distress.  Elderly   HENT:  Head: Normocephalic.  Right Ear: External ear normal.  Left Ear: External ear normal.  Mouth/Throat: Oropharynx is clear and moist. No oropharyngeal exudate.  Eyes: Pupils are equal, round, and reactive to light. Conjunctivae and EOM are normal. Left eye exhibits no discharge. No scleral icterus.  Neck: Normal range of  motion. No JVD present. No thyromegaly present.  Cardiovascular: Normal rate, regular rhythm, normal heart sounds and intact distal pulses.  Exam reveals no gallop and no friction rub.   No murmur heard. Pulmonary/Chest: Effort normal and breath sounds normal. No respiratory distress. She has no wheezes. She has no rales.  Abdominal: Soft. Bowel sounds are normal. She exhibits no distension. There is no tenderness.  There is no rebound and no guarding.  Musculoskeletal:  Unsteady gait uses FWW. Left leg chronic weakness and non pitting edema.  Lymphadenopathy:    She has no cervical adenopathy.  Neurological: Coordination normal.  Pleasantly confused at times at her baseline.   Skin: Skin is warm and dry. No rash noted. No erythema. No pallor.  Psychiatric: She has a normal mood and affect.    Labs reviewed:  Recent Labs  01/08/16 05/07/16 11/02/16  NA 139 141 140  K 4.5 4.6 4.7  BUN 29* 29* 32*  CREATININE 0.6 0.8 1.0    Recent Labs  01/08/16 05/07/16  AST 16 18  ALT 10 11  ALKPHOS 74 81    Recent Labs  04/13/16 05/07/16 11/06/16 1238  WBC 7.2 7.2 9.5  NEUTROABS  --   --  7.7*  HGB 19.4* 17.8* 18.2*  HCT 58* 53* 55.1*  MCV  --   --  102.3*  PLT 373 321 363   Lab Results  Component Value Date   TSH 1.91 11/05/2016   Lab Results  Component Value Date   HGBA1C 5.8 10/01/2016   Lab Results  Component Value Date   CHOL 148 10/01/2016   HDL 69 10/01/2016   LDLCALC 64 10/01/2016   TRIG 73 10/01/2016    Significant Diagnostic Results in last 30 days:  No results found.  Assessment/Plan 1. Chronic obstructive pulmonary disease, unspecified COPD type Reports some shortness of breath and wheezing with exertion.No wheezing during visit. Continue on Spiriva 18 mcg INH capsule and Duoneb.continue to monitor.   2. Essential hypertension B/p elevated this visit. Blood pressure log reviewed within range. Will continue to monitor for now. Continue on Losartan 100 mg  tablet daily, clonidine 0.1 mg Tablet, diltiazem 120 mg 24 Hr capsule and coreg 25 mg Tablet twice daily.monitor B/p. Continue to monitor BMP.    3. Hypothyroidism Lab Results  Component Value Date   TSH 1.91 11/05/2016  Continue on levothyroxine 50 mcg tablet. Monitor TSH level.   4. Depression Mood stable.Continue on Cymbalta 40 mg capsule.monitor for mood changes.    5. Polycythemia Recent Hgb 18.2 HCT 55.1 ( 11/06/2016).continues to complain of chronic fatigue. Follows up with oncology no phelbotomy treatment recommended due to advance age.continue to follow up with Hematology oncology. Monitor H/H.     6. History of CVA (cerebrovascular accident) Left leg weakness. Continue to control high risk factors. Continue ASA 81 mg tablet daily.  Family/ staff Communication: Reviewed plan of care with patient and facility Nurse.   Labs/tests ordered: None   Leondro Coryell C Kanin Lia, NP

## 2016-12-07 ENCOUNTER — Encounter: Payer: Self-pay | Admitting: Family

## 2016-12-07 ENCOUNTER — Non-Acute Institutional Stay (SKILLED_NURSING_FACILITY): Payer: Medicare Other | Admitting: Family

## 2016-12-07 DIAGNOSIS — R05 Cough: Secondary | ICD-10-CM

## 2016-12-07 DIAGNOSIS — R630 Anorexia: Secondary | ICD-10-CM | POA: Diagnosis not present

## 2016-12-07 DIAGNOSIS — F329 Major depressive disorder, single episode, unspecified: Secondary | ICD-10-CM | POA: Diagnosis not present

## 2016-12-07 DIAGNOSIS — F32A Depression, unspecified: Secondary | ICD-10-CM

## 2016-12-07 DIAGNOSIS — R059 Cough, unspecified: Secondary | ICD-10-CM

## 2016-12-07 NOTE — Progress Notes (Signed)
Location:  [place] Room Number: 10 Place of Service:  SNF (31) Provider: Hayk Divis FNP-C  Blanchie Serve, MD  Patient Care Team: Blanchie Serve, MD as PCP - General (Internal Medicine) Gaynelle Arabian, MD as Consulting Physician (Orthopedic Surgery) [name], [place] (Skilled Nursing and [place]) Vaniya Augspurger, Nelda Bucks, NP as Nurse Practitioner (Family Medicine)  Extended Emergency Contact Information Primary Emergency Contact: Johnston,Betsy Address: [place], [place] Phone: [phone] Relation: Daughter Secondary Emergency Contact: Rey,Lindsey  [place] Phone: [phone] Relation: Daughter  Code Status:  DNR Goals of care: Advanced Directive information Advanced Directives 12/07/2016  Does Patient Have a Medical Advance Directive? -  Type of Advance Directive Out of facility DNR (pink MOST or yellow form);Living will;Healthcare Power of Attorney  Does patient want to make changes to medical advance directive? -  Copy of [place] in Chart? Yes  Pre-existing out of facility DNR order (yellow form or pink MOST form) Yellow form placed in chart (order not valid for inpatient use);Pink MOST form placed in chart (order not valid for inpatient use)     Chief Complaint  Patient presents with  . Acute Visit    behavior changes per staff    HPI:  Pt is a 83 y.o. female seen today at [facility] for an acute visit for evaluation of behavioral changes,refusal of medication and meals. She is seen in her room today per facility nurse request.Nurse reports patient has had increase agitation and refused breakfast and medication 12/06/2016. Also reports patient has been verbally aggressive towards Nurse and Aide.Patient's daughter Brown Human request Cymbalta to be increased back to 40 mg Capsule and also add Ativan as needed to help calm patient  during aggressive episodes. Patient denies worsening symptoms of depression.she states refused meals due to loss of appetite because she doesn't feel good. She also states did not refused her medication on the previous day but was upset about the care she didn't receive.she complains of worsening cough. She denies any fever, chills, shortness of breath or wheezing.    Past Medical History:  Diagnosis Date  . Back pain, thoracic 01/12/2014  . Cancer ([place]) 1980   Breast cancer  . Constipation 11/18/2012  . COPD (chronic obstructive pulmonary disease) ([place])   . CVA (cerebral infarction) 06/08/2011   Following abdominal surgery for bowel obstruction   . Depression 12/26/2013  . DVT (deep venous thrombosis) ([place]) 11/18/2012  . Fall 02/07/2013  . GERD (gastroesophageal reflux disease) 03/30/2014  . Granuloma annulare 06/08/2014  . HTN (hypertension) 11/18/2012   01/15/14 Bun/creat 14/0.60   . Hypertension   . Hypothyroidism   . Incisional hernia, without obstruction or gangrene 05/27/2014   Lower abd from previous surgery-2-3 small incisional hernias which are reducible-no incarceration or pain complained.    . Insomnia 11/18/2012  . Left hemiparesis ([place]) 06/08/2014  . Osteoporosis   . Shortness of breath   . Thoracic spine fracture [facility]) 12/18/2013   01/05/14 T 10 Dr. Newman Pies: PCP managing pain.  F/u 2 months(suggested that if the pain is "not too bad" and seems to be improving that she "live with it" and give a change to heal without intervention.  12/18/13 X-ray Thoracic spine: Compression deformity at the level of T10. Comparing back to a chest x-ray from 12/09/2013, this appears new in the interval.       .  TIA (transient ischemic attack)   . Ventral hernia 06/08/2014   Past Surgical History:  Procedure Laterality Date  . ABDOMINAL HYSTERECTOMY    . CATARACT EXTRACTION, BILATERAL    . EXPLORATORY LAPAROTOMY W/ BOWEL RESECTION     10/19/12-11/01/12 [facility] for exploratory  laparotomty with extensive lysis of adhesion and segmental small bowel resection with end to end anastomosis per Dr. Isidore Moos 10/22/12  . MASTECTOMY  1980   left side  . TONSILLECTOMY    . TOTAL KNEE ARTHROPLASTY  2010   left knee    Allergies  Allergen Reactions  . Cortisone     Outpatient Encounter Prescriptions as of 12/07/2016  Medication Sig  . acetaminophen (TYLENOL) 500 MG tablet Take 1 tablet (500 mg total) by mouth every 8 (eight) hours.  Marland Kitchen albuterol (VENTOLIN HFA) 108 (90 Base) MCG/ACT inhaler Inhale 2 puffs into the lungs daily. Take 2 puffs Every 6 hours as needed for shortness of breath and wheezing  . antiseptic oral rinse (BIOTENE) LIQD 15 mLs by Mouth Rinse route daily as needed for dry mouth.   Marland Kitchen aspirin 81 MG chewable tablet Chew 81 mg by mouth every morning.  . bisacodyl (DULCOLAX) 10 MG suppository Place 10 mg rectally every three (3) days as needed for moderate constipation.  . carvedilol (COREG) 25 MG tablet Take 25 mg by mouth 2 (two) times daily with a meal. Hold if SBP <110 and HR <60/min  . cloNIDine (CATAPRES) 0.1 MG tablet Take 0.1 mg by mouth every morning.   Marland Kitchen Dextran 70-Hypromellose (ARTIFICIAL TEARS) 0.1-0.3 % SOLN Apply 1 drop to eye daily. Both eyes daily and up to four times daily as needed for burning.  Marland Kitchen Dextromethorphan-Guaifenesin (TUSSIN DM) 10-100 MG/5ML liquid Take by mouth. Give 10 ml every 6 hours as needed for cough  . diltiazem (CARDIZEM CD) 120 MG 24 hr capsule Take 120 mg by mouth daily.   . DULoxetine (CYMBALTA) 30 MG capsule Take 30 mg by mouth daily.  . Glycerin, Laxative, (PEDIA-LAX) 2.8 g SUPP Place rectally daily as needed (for constipation).   Marland Kitchen ipratropium-albuterol (DUONEB) 0.5-2.5 (3) MG/3ML SOLN Take 3 mLs by nebulization every 4 (four) hours as needed. For shortness of breath and wheezing.   Marland Kitchen levothyroxine (SYNTHROID, LEVOTHROID) 50 MCG tablet Take 50 mcg by mouth every morning.   Marland Kitchen losartan (COZAAR) 100 MG tablet Take 100 mg by  mouth daily.  . Melatonin 3 MG TBDP Take 3 mg by mouth at bedtime.  . Multiple Vitamin (MULTIVITAMIN WITH MINERALS) TABS tablet Take 1 tablet by mouth every morning.  . Nutritional Supplements (RESOURCE 2.0) LIQD Take 120 mLs by mouth 2 (two) times daily.  Marland Kitchen omeprazole (PRILOSEC) 20 MG capsule Take 20 mg by mouth daily.   . polyethylene glycol (MIRALAX / GLYCOLAX) packet Take 17 g by mouth at bedtime.   . predniSONE (DELTASONE) 5 MG tablet Take 5 mg by mouth daily with breakfast.  . tiotropium (SPIRIVA) 18 MCG inhalation capsule Place 18 mcg into inhaler and inhale daily. Inhale by taking 2 separate inhalations  . traMADol (ULTRAM) 50 MG tablet Take 50 mg by mouth every 6 (six) hours as needed.   No facility-administered encounter medications on file as of 12/07/2016.     Review of Systems  Constitutional: Positive for appetite change and fatigue. Negative for activity change, chills and fever.  HENT: Negative for congestion, rhinorrhea, sinus pain, sinus pressure, sneezing and sore throat.   Eyes: Negative for discharge, redness and itching.  Respiratory: Positive for cough. Negative for chest tightness, shortness of breath and wheezing.   Cardiovascular: Negative for chest pain, palpitations and leg swelling.  Gastrointestinal: Negative for abdominal distention, abdominal pain, constipation, diarrhea, nausea and vomiting.  Genitourinary: Negative for dysuria, flank pain, frequency and urgency.  Musculoskeletal: Positive for arthralgias and gait problem.  Skin: Negative for color change, pallor and rash.  Neurological: Negative for dizziness, light-headedness and headaches.  Psychiatric/Behavioral: Negative for agitation, confusion, hallucinations and sleep disturbance. The patient is not nervous/anxious.     Immunization History  Administered Date(s) Administered  . Influenza,inj,Quad PF,6+ Mos 12/19/2013  . Influenza-Unspecified 01/21/2013, 12/28/2014, 01/03/2016  . PPD Test  12/20/2013, 01/02/2014  . Pneumococcal Conjugate-13 07/25/2016  . Pneumococcal Polysaccharide-23 11/24/1999  . Td 12/28/1994  . Tdap 04/08/2013   Pertinent  Health Maintenance Due  Topic Date Due  . INFLUENZA VACCINE  10/21/2016  . DEXA SCAN  Completed  . PNA vac Low Risk Adult  Completed   Fall Risk  10/26/2016 12/03/2014 06/08/2014  Falls in the past year? Yes Yes No  Number falls in past yr: 2 or more 1 -  Injury with Fall? No No -  Risk for fall due to : - History of fall(s);Impaired balance/gait;Impaired mobility Impaired balance/gait  Follow up - Falls evaluation completed -    Vitals:   12/07/16 1410  BP: 120/70  Pulse: 72  Resp: 20  Temp: (!) 97.3 F (36.3 C)  SpO2: 97%  Weight: 127 lb (57.6 kg)  Height: 5\' 2"  (1.575 m)   Body mass index is 23.23 kg/m. Physical Exam  Constitutional: She is oriented to person, place, and time. She appears well-developed and well-nourished.  Elderly in no acute distress   HENT:  Head: Normocephalic.  Mouth/Throat: No oropharyngeal exudate.  Eyes: Pupils are equal, round, and reactive to light. Conjunctivae and EOM are normal. Right eye exhibits no discharge. Left eye exhibits no discharge. No scleral icterus.  Neck: Normal range of motion. No JVD present. No thyromegaly present.  Cardiovascular: Normal rate, regular rhythm, normal heart sounds and intact distal pulses.  Exam reveals no gallop and no friction rub.   No murmur heard. Pulmonary/Chest: Effort normal. No respiratory distress. She exhibits no tenderness.  Bilateral lung rales noted   Abdominal: Soft. Bowel sounds are normal. She exhibits no distension. There is no tenderness. There is no rebound and no guarding.  Lymphadenopathy:    She has no cervical adenopathy.  Neurological: She is oriented to person, place, and time. Coordination normal.  Skin: Skin is warm and dry. No rash noted. No erythema. No pallor.  Psychiatric: She has a normal mood and affect.   Labs  reviewed:  Recent Labs  01/08/16 05/07/16 11/02/16  NA 139 141 140  K 4.5 4.6 4.7  BUN 29* 29* 32*  CREATININE 0.6 0.8 1.0    Recent Labs  01/08/16 05/07/16  AST 16 18  ALT 10 11  ALKPHOS 74 81    Recent Labs  04/13/16 05/07/16 11/06/16 1238  WBC 7.2 7.2 9.5  NEUTROABS  --   --  7.7*  HGB 19.4* 17.8* 18.2*  HCT 58* 53* 55.1*  MCV  --   --  102.3*  PLT 373 321 363   Lab Results  Component Value Date   TSH 1.91 11/05/2016   Lab Results  Component Value Date   HGBA1C 5.8 10/01/2016   Lab Results  Component Value Date   CHOL 148 10/01/2016   HDL 69 10/01/2016  [place] 64 10/01/2016   TRIG 73 10/01/2016    Significant Diagnostic Results in last 30 days:  No results found.  Assessment/Plan 1. Cough Afebrile. Bilateral upper lobes rales noted.Obtain portable CXR Pa/Lat rule out PNA.Change DuoNeb to every 6 hours while awake x 1 week then change to previous PRN. Continue on cough syrup. Monitor vital signs every shift.   2. Loss of appetite Eating small amounts of food due to not feeling well.Encourage to drink protein supplements and hydration. Monitor intake.   3. Depression  Increased agitation and verbally abusive per night shift nurse.States did not refuse her medication during the night. No agitation reported today. Psychiatry service to evaluate.   Family/ staff Communication: Reviewed plan of care with patient and facility Nurse supervisor  Labs/tests ordered:  portable CXR Pa/Lat rule out PNA.  Sandrea Hughs, NP

## 2016-12-10 DIAGNOSIS — F411 Generalized anxiety disorder: Secondary | ICD-10-CM | POA: Diagnosis not present

## 2016-12-30 ENCOUNTER — Encounter: Payer: Self-pay | Admitting: Internal Medicine

## 2016-12-30 ENCOUNTER — Non-Acute Institutional Stay (SKILLED_NURSING_FACILITY): Payer: Medicare Other | Admitting: Internal Medicine

## 2016-12-30 DIAGNOSIS — Z86718 Personal history of other venous thrombosis and embolism: Secondary | ICD-10-CM | POA: Insufficient documentation

## 2016-12-30 DIAGNOSIS — M159 Polyosteoarthritis, unspecified: Secondary | ICD-10-CM | POA: Diagnosis not present

## 2016-12-30 DIAGNOSIS — J449 Chronic obstructive pulmonary disease, unspecified: Secondary | ICD-10-CM

## 2016-12-30 DIAGNOSIS — I10 Essential (primary) hypertension: Secondary | ICD-10-CM | POA: Diagnosis not present

## 2016-12-30 DIAGNOSIS — Z8673 Personal history of transient ischemic attack (TIA), and cerebral infarction without residual deficits: Secondary | ICD-10-CM | POA: Insufficient documentation

## 2016-12-30 NOTE — Progress Notes (Signed)
Location:  [facility] Room Number: 10 Place of Service:  SNF [phone]) Provider:  Anders Simmonds, Karalee Height, MD  Patient Care Team: Blanchie Serve, MD as PCP - General (Internal Medicine) Gaynelle Arabian, MD as Consulting Physician (Orthopedic Surgery) [name], [place] (Skilled Nursing and [place]) [place], Nelda Bucks, NP as Nurse Practitioner (Family Medicine)  Extended Emergency Contact Information Primary Emergency Contact: Johnston,Betsy Address: [place], [place] Phone: [phone] Relation: Daughter Secondary Emergency Contact: Rey,Lindsey  [place] Phone: [phone] Relation: Daughter  Code Status:  DNR Goals of care: Advanced Directive information Advanced Directives 12/30/2016  Does Patient Have a Medical Advance Directive? Yes  Type of Paramedic of [place];Living will;Out of facility DNR (pink MOST or yellow form)  Does patient want to make changes to medical advance directive? No - Patient declined  Copy of [place] in Chart? Yes  Pre-existing out of facility DNR order (yellow form or pink MOST form) Yellow form placed in chart (order not valid for inpatient use);Pink MOST form placed in chart (order not valid for inpatient use)     Chief Complaint  Patient presents with  . Medical Management of Chronic Issues    Routine Visit     HPI:  Pt is a 83 y.o. female seen today for medical management of chronic diseases.   Generalized OA- positive for pain. Currently on acetaminophen 500 mg tid and tramadol 50 mg q6h prn pain.  Hypertension- Currently on clonidine 0.1 mg daily, losartan 100 mg daily, carvedilol 25 mg bid and diltiazem 120 mg daily. Complaints of dizziness with positional change. No fall reported.   COPD- Breathing stable. No signs of exacerbation. Currently on prednisone 5 mg daily, spiriva  and duoneb. Has chronic cough and dyspnea on exertion. Breathing currently stable.   History of CVA- with chronic left sided weakness and edema of left leg. No recent fall reported. Gets around with her walker and wheelchair.   Past Medical History:  Diagnosis Date  . Back pain, thoracic 01/12/2014  . Cancer ([place]) 1980   Breast cancer  . Constipation 11/18/2012  . COPD (chronic obstructive pulmonary disease) ([place])   . CVA (cerebral infarction) 06/08/2011   Following abdominal surgery for bowel obstruction   . Depression 12/26/2013  . DVT (deep venous thrombosis) ([place]) 11/18/2012  . Fall 02/07/2013  . GERD (gastroesophageal reflux disease) 03/30/2014  . Granuloma annulare 06/08/2014  . HTN (hypertension) 11/18/2012   01/15/14 Bun/creat 14/0.60   . Hypertension   . Hypothyroidism   . Incisional hernia, without obstruction or gangrene 05/27/2014   Lower abd from previous surgery-2-3 small incisional hernias which are reducible-no incarceration or pain complained.    . Insomnia 11/18/2012  . Left hemiparesis ([place]) 06/08/2014  . Osteoporosis   . Shortness of breath   . Thoracic spine fracture [facility]) 12/18/2013   01/05/14 T 10 Dr. Newman Pies: PCP managing pain.  F/u 2 months(suggested that if the pain is "not too bad" and seems to be improving that she "live with it" and give a change to heal without intervention.  12/18/13 X-ray Thoracic spine: Compression deformity at the level of T10. Comparing back to a chest x-ray from 12/09/2013, this appears new in the interval.       . TIA (transient ischemic attack)   . Ventral hernia 06/08/2014   Past  Surgical History:  Procedure Laterality Date  . ABDOMINAL HYSTERECTOMY    . CATARACT EXTRACTION, BILATERAL    . EXPLORATORY LAPAROTOMY W/ BOWEL RESECTION     10/19/12-11/01/12 [facility] for exploratory laparotomty with extensive lysis of adhesion and segmental small bowel resection with end to end anastomosis per Dr. Isidore Moos 10/22/12  . MASTECTOMY  1980     left side  . TONSILLECTOMY    . TOTAL KNEE ARTHROPLASTY  2010   left knee    Allergies  Allergen Reactions  . Cortisone     Outpatient Encounter Prescriptions as of 12/30/2016  Medication Sig  . acetaminophen (TYLENOL) 500 MG tablet Take 500 mg by mouth 3 (three) times daily.  Marland Kitchen albuterol (VENTOLIN HFA) 108 (90 Base) MCG/ACT inhaler Inhale 2 puffs into the lungs daily. Take 2 puffs Every 6 hours as needed for shortness of breath and wheezing  . antiseptic oral rinse (BIOTENE) LIQD 15 mLs by Mouth Rinse route daily as needed for dry mouth.   Marland Kitchen aspirin 81 MG chewable tablet Chew 81 mg by mouth every morning.  . bisacodyl (DULCOLAX) 10 MG suppository Place 10 mg rectally every three (3) days as needed for moderate constipation.  . carvedilol (COREG) 25 MG tablet Take 25 mg by mouth 2 (two) times daily with a meal. Hold if SBP <110 and HR <60/min  . cloNIDine (CATAPRES) 0.1 MG tablet Take 0.1 mg by mouth every morning.   Marland Kitchen Dextran 70-Hypromellose (ARTIFICIAL TEARS) 0.1-0.3 % SOLN Apply 1 drop to eye daily. Both eyes daily and up to four times daily as needed for burning.  Marland Kitchen Dextromethorphan-Guaifenesin (TUSSIN DM) 10-100 MG/5ML liquid Take by mouth. Give 10 ml every 6 hours as needed for cough  . diltiazem (CARDIZEM CD) 120 MG 24 hr capsule Take 120 mg by mouth daily.   . divalproex (DEPAKOTE SPRINKLE) 125 MG capsule Take 125 mg by mouth 2 (two) times daily.  . DULoxetine (CYMBALTA) 20 MG capsule Take 40 mg by mouth daily.  . Glycerin, Laxative, (PEDIA-LAX) 2.8 g SUPP Place rectally daily as needed (for constipation).   Marland Kitchen ipratropium-albuterol (DUONEB) 0.5-2.5 (3) MG/3ML SOLN Take 3 mLs by nebulization every 4 (four) hours as needed. For shortness of breath and wheezing.   Marland Kitchen levothyroxine (SYNTHROID, LEVOTHROID) 50 MCG tablet Take 50 mcg by mouth every morning.   Marland Kitchen losartan (COZAAR) 100 MG tablet Take 100 mg by mouth daily.  . Melatonin 3 MG TBDP Take 3 mg by mouth at bedtime.  .  Multiple Vitamin (MULTIVITAMIN WITH MINERALS) TABS tablet Take 1 tablet by mouth every morning.  . Nutritional Supplements (RESOURCE 2.0) LIQD Take 120 mLs by mouth 2 (two) times daily.  Marland Kitchen omeprazole (PRILOSEC) 20 MG capsule Take 20 mg by mouth daily.   . polyethylene glycol (MIRALAX / GLYCOLAX) packet Take 17 g by mouth at bedtime.   . predniSONE (DELTASONE) 5 MG tablet Take 5 mg by mouth daily with breakfast.  . tiotropium (SPIRIVA) 18 MCG inhalation capsule Place 18 mcg into inhaler and inhale daily. Inhale by taking 2 separate inhalations  . traMADol (ULTRAM) 50 MG tablet Take 50 mg by mouth every 6 (six) hours as needed.  . [DISCONTINUED] acetaminophen (TYLENOL) 500 MG tablet Take 1 tablet (500 mg total) by mouth every 8 (eight) hours.  . [DISCONTINUED] DULoxetine (CYMBALTA) 30 MG capsule Take 30 mg by mouth daily.   No facility-administered encounter medications on file as of 12/30/2016.     Review of Systems  Constitutional: Negative  for appetite change, fatigue and fever.  HENT: Positive for hearing loss. Negative for congestion, ear pain, mouth sores, rhinorrhea, sinus pressure, sore throat and trouble swallowing.   Eyes: Negative for pain.  Respiratory: Positive for cough and shortness of breath. Negative for wheezing.        Has chronic dyspnea with minimal exertion   Cardiovascular: Positive for leg swelling. Negative for chest pain and palpitations.  Gastrointestinal: Positive for constipation and nausea. Negative for abdominal pain, blood in stool, diarrhea and vomiting.       Had bowel movement yesterday  Genitourinary: Negative for dysuria and frequency.  Musculoskeletal: Positive for back pain. Negative for joint swelling.  Skin: Negative for rash and wound.  Neurological: Positive for dizziness. Negative for seizures, numbness and headaches.  Psychiatric/Behavioral: Positive for behavioral problems and confusion.       Depakote has been helpful    Immunization History   Administered Date(s) Administered  . Influenza,inj,Quad PF,6+ Mos 12/19/2013  . Influenza-Unspecified 01/21/2013, 12/28/2014, 01/03/2016  . PPD Test 12/20/2013, 01/02/2014  . Pneumococcal Conjugate-13 07/25/2016  . Pneumococcal Polysaccharide-23 11/24/1999  . Td 12/28/1994  . Tdap 04/08/2013   Pertinent  Health Maintenance Due  Topic Date Due  . INFLUENZA VACCINE  12/30/2016 (Originally 10/21/2016)  . DEXA SCAN  Completed  . PNA vac Low Risk Adult  Completed   Fall Risk  10/26/2016 12/03/2014 06/08/2014  Falls in the past year? Yes Yes No  Number falls in past yr: 2 or more 1 -  Injury with Fall? No No -  Risk for fall due to : - History of fall(s);Impaired balance/gait;Impaired mobility Impaired balance/gait  Follow up - Falls evaluation completed -   Functional Status Survey:    Vitals:   12/30/16 1204  BP: (!) 157/76  Pulse: 72  Resp: 18  Temp: 98.6 F (37 C)  TempSrc: Oral  SpO2: 95%  Weight: 123 lb (55.8 kg)  Height: '5\' 2"'  (1.575 m)   Body mass index is 22.5 kg/m.   Wt Readings from Last 3 Encounters:  12/30/16 123 lb (55.8 kg)  12/07/16 127 lb (57.6 kg)  12/03/16 127 lb (57.6 kg)    Physical Exam  Constitutional: She is oriented to person, place, and time. She appears well-developed and well-nourished. No distress.  HENT:  Head: Normocephalic and atraumatic.  Mouth/Throat: Oropharynx is clear and moist.  Eyes: Pupils are equal, round, and reactive to light. Conjunctivae are normal.  Neck: Normal range of motion. Neck supple.  Cardiovascular: Normal rate and regular rhythm.   Pulmonary/Chest: Effort normal. She has no wheezes. She has no rales.  Decreased air entry  Abdominal: Soft. Bowel sounds are normal. There is no tenderness. There is no guarding.  No epigastric tenderness. Reducible lower abdominal hernia  Musculoskeletal: She exhibits edema.  Trace ankle edema left >right, Arthritis changes to her fingers, Uses rollator walker, chronic left sided  weakness  Lymphadenopathy:    She has no cervical adenopathy.  Neurological: She is alert and oriented to person, place, and time.  Skin: Skin is warm and dry. She is not diaphoretic.  Old surgical scar to left knee   Psychiatric: She has a normal mood and affect.    Labs reviewed:  Recent Labs  05/07/16 11/02/16 11/26/16  NA 141 140 139  K 4.6 4.7 4.5  BUN 29* 32* 31*  CREATININE 0.8 1.0 0.8    Recent Labs  01/08/16 05/07/16 11/26/16  AST '16 18 16  ' ALT 10 11 12  ALKPHOS 74 81 85    Recent Labs  05/07/16 11/06/16 1238 11/26/16  WBC 7.2 9.5 7.2  NEUTROABS  --  7.7*  --   HGB 17.8* 18.2* 18.0*  HCT 53* 55.1* 53*  MCV  --  102.3*  --   PLT 321 363 333   Lab Results  Component Value Date   TSH 1.91 11/05/2016   Lab Results  Component Value Date   HGBA1C 5.8 10/01/2016   Lab Results  Component Value Date   CHOL 148 10/01/2016   HDL 69 10/01/2016   LDLCALC 64 10/01/2016   TRIG 73 10/01/2016  11/02/16 eGFR 50    Significant Diagnostic Results in last 30 days:  No results found.  Assessment/Plan  Generalized OA Currently on acetaminophen 500 mg tid and tramadol 50 mg q6h prn pain. Has not required her tramadol. Discontinue tramadol. Change her acetaminophen to 1000 mg in am and bedtime and 500 mg noon. Apply biofreeze bid x 1 week and then bid prn.   Hypertension Currently on clonidine 0.1 mg daily, losartan 100 mg daily, carvedilol 25 mg bid and diltiazem 120 mg daily. Has few elevated BP reading but also complaints of dizziness with change of position. On 2 AV nodal blocking agent. D/c diltiazem. Start hydralazine 50 mg bid. Monitor BP and reassess.   History of CVA With left sided weakness. Continue aspirin and antihypertensives. LDL at goal.  Lipid Panel     Component Value Date/Time   CHOL 148 10/01/2016   TRIG 73 10/01/2016   HDL 69 10/01/2016   LDLCALC 64 10/01/2016   COPD Breathing stable. No signs of exacerbation. Continue prednisone 5 mg  daily, spiriva and duoneb.     Family/ staff Communication: reviewed care plan with pt and charge nurse  Labs/tests ordered:  None   Blanchie Serve, MD Internal Medicine [place], [place] [address] Cell Phone (Monday-Friday 8 am - 5 pm): [phone] On Call: [phone] and follow prompts after 5 pm and on weekends Office Phone: [phone] Office Fax: [phone]

## 2017-01-07 DIAGNOSIS — F411 Generalized anxiety disorder: Secondary | ICD-10-CM | POA: Diagnosis not present

## 2017-01-11 ENCOUNTER — Non-Acute Institutional Stay (SKILLED_NURSING_FACILITY): Payer: Medicare Other | Admitting: Family

## 2017-01-11 DIAGNOSIS — E039 Hypothyroidism, unspecified: Secondary | ICD-10-CM | POA: Diagnosis not present

## 2017-01-11 DIAGNOSIS — J441 Chronic obstructive pulmonary disease with (acute) exacerbation: Secondary | ICD-10-CM | POA: Diagnosis not present

## 2017-01-11 DIAGNOSIS — E870 Hyperosmolality and hypernatremia: Secondary | ICD-10-CM | POA: Diagnosis not present

## 2017-01-11 DIAGNOSIS — I10 Essential (primary) hypertension: Secondary | ICD-10-CM | POA: Diagnosis not present

## 2017-01-11 DIAGNOSIS — R0602 Shortness of breath: Secondary | ICD-10-CM | POA: Diagnosis not present

## 2017-01-11 DIAGNOSIS — R451 Restlessness and agitation: Secondary | ICD-10-CM

## 2017-01-11 DIAGNOSIS — Z79899 Other long term (current) drug therapy: Secondary | ICD-10-CM | POA: Diagnosis not present

## 2017-01-11 DIAGNOSIS — R5381 Other malaise: Secondary | ICD-10-CM | POA: Diagnosis not present

## 2017-01-11 LAB — CBC AND DIFFERENTIAL
HCT: 50 — AB (ref 36–46)
Hemoglobin: 17.1 — AB (ref 12.0–16.0)
Platelets: 264 (ref 150–399)
WBC: 9.5

## 2017-01-11 LAB — BASIC METABOLIC PANEL
BUN: 28 — AB (ref 4–21)
Creatinine: 0.9 (ref 0.5–1.1)
GLUCOSE: 145
POTASSIUM: 4.7 (ref 3.4–5.3)
SODIUM: 142 (ref 137–147)

## 2017-01-11 LAB — HEPATIC FUNCTION PANEL
ALT: 23 (ref 7–35)
AST: 23 (ref 13–35)
Alkaline Phosphatase: 81 (ref 25–125)
Bilirubin, Total: 1

## 2017-01-11 NOTE — Progress Notes (Signed)
Location:  [facility] Room Number: 10 Place of Service:  SNF (31) Provider: Cameran Pettey FNP-C  Blanchie Serve, MD  Patient Care Team: Blanchie Serve, MD as PCP - General (Internal Medicine) Gaynelle Arabian, MD as Consulting Physician (Orthopedic Surgery) [name], [place] (Skilled Nursing and [place]) Cleotilde Spadaccini, Nelda Bucks, NP as Nurse Practitioner (Family Medicine)  Extended Emergency Contact Information Primary Emergency Contact: Johnston,Betsy Address: [place], [place] of [place] Phone: [phone] Relation: Daughter Secondary Emergency Contact: Rey,Lindsey  [place] Phone: [phone] Relation: Daughter  Code Status:  DNR Goals of care: Advanced Directive information Advanced Directives 12/30/2016  Does Patient Have a Medical Advance Directive? Yes  Type of Paramedic of [place];Living will;Out of facility DNR (pink MOST or yellow form)  Does patient want to make changes to medical advance directive? No - Patient declined  Copy of [place] in Chart? Yes  Pre-existing out of facility DNR order (yellow form or pink MOST form) Yellow form placed in chart (order not valid for inpatient use);Pink MOST form placed in chart (order not valid for inpatient use)     Chief Complaint  Patient presents with  . Acute Visit    Shortness of breath, wheezing, increased confusion and refusing meals    HPI:  Pt is a 83 y.o. female seen today at [facility] for an acute visit for evaluation of shortness of breath, wheezing,increased confusion and refusing meals.she has a significant medical history of COPD, HTN,Depression,OA among other condition.She is seen in her room today lying in the bed. She states " I had a rough night" due to trouble breathing. Facility Nurse reports patient as had increased agitation and verbally abusive towards  staff. Also reported to staff that her room was filled with smoke.She call 911 in the morning prior to this visit.she states feels better after her breathing treatment. she denies any fever, chills or signs of urinary tract infections.     Past Medical History:  Diagnosis Date  . Back pain, thoracic 01/12/2014  . Cancer ([place]) 1980   Breast cancer  . Constipation 11/18/2012  . COPD (chronic obstructive pulmonary disease) ([place])   . CVA (cerebral infarction) 06/08/2011   Following abdominal surgery for bowel obstruction   . Depression 12/26/2013  . DVT (deep venous thrombosis) ([place]) 11/18/2012  . Fall 02/07/2013  . GERD (gastroesophageal reflux disease) 03/30/2014  . Granuloma annulare 06/08/2014  . HTN (hypertension) 11/18/2012   01/15/14 Bun/creat 14/0.60   . Hypertension   . Hypothyroidism   . Incisional hernia, without obstruction or gangrene 05/27/2014   Lower abd from previous surgery-2-3 small incisional hernias which are reducible-no incarceration or pain complained.    . Insomnia 11/18/2012  . Left hemiparesis ([place]) 06/08/2014  . Osteoporosis   . Shortness of breath   . Thoracic spine fracture [facility]) 12/18/2013   01/05/14 T 10 Dr. Newman Pies: PCP managing pain.  F/u 2 months(suggested that if the pain is "not too bad" and seems to be improving that she "live with it" and give a change to heal without intervention.  12/18/13 X-ray Thoracic spine: Compression deformity at the level of T10. Comparing back to a chest x-ray from 12/09/2013, this appears new in the interval.       . TIA (transient ischemic attack)   . Ventral hernia 06/08/2014   Past Surgical History:  Procedure Laterality Date  . ABDOMINAL HYSTERECTOMY    . CATARACT EXTRACTION, BILATERAL    . EXPLORATORY LAPAROTOMY W/ BOWEL RESECTION     10/19/12-11/01/12 [facility] for exploratory laparotomty with extensive lysis of adhesion and segmental small bowel resection with end to end anastomosis per Dr. Isidore Moos 10/22/12  .  MASTECTOMY  1980   left side  . TONSILLECTOMY    . TOTAL KNEE ARTHROPLASTY  2010   left knee    Allergies  Allergen Reactions  . Cortisone     Outpatient Encounter Prescriptions as of 01/11/2017  Medication Sig  . acetaminophen (TYLENOL) 500 MG tablet Take 500 mg by mouth daily. At 2 Pm and 1000 mg tablet twice daily at 8Am and 9 PM  . albuterol (VENTOLIN HFA) 108 (90 Base) MCG/ACT inhaler Inhale 2 puffs into the lungs daily. Take 2 puffs Every 6 hours as needed for shortness of breath and wheezing  . antiseptic oral rinse (BIOTENE) LIQD 15 mLs by Mouth Rinse route daily as needed for dry mouth.   Marland Kitchen aspirin 81 MG chewable tablet Chew 81 mg by mouth every morning.  . bisacodyl (DULCOLAX) 10 MG suppository Place 10 mg rectally every three (3) days as needed for moderate constipation.  . carvedilol (COREG) 25 MG tablet Take 25 mg by mouth 2 (two) times daily with a meal. Hold if SBP <110 and HR <60/min  . cloNIDine (CATAPRES) 0.1 MG tablet Take 0.1 mg by mouth every morning.   Marland Kitchen Dextran 70-Hypromellose (ARTIFICIAL TEARS) 0.1-0.3 % SOLN Apply 1 drop to eye daily. Both eyes daily and up to four times daily as needed for burning.  Marland Kitchen Dextromethorphan-Guaifenesin (TUSSIN DM) 10-100 MG/5ML liquid Take by mouth. Give 10 ml every 6 hours as needed for cough  . divalproex (DEPAKOTE SPRINKLE) 125 MG capsule Take 125 mg by mouth 2 (two) times daily.  . DULoxetine (CYMBALTA) 20 MG capsule Take 30 mg by mouth daily.   . Glycerin, Laxative, (PEDIA-LAX) 2.8 g SUPP Place rectally daily as needed (for constipation).   . hydrALAZINE (APRESOLINE) 50 MG tablet Take 50 mg by mouth 2 (two) times daily. Hold for SBP < 110  . ipratropium-albuterol (DUONEB) 0.5-2.5 (3) MG/3ML SOLN Take 3 mLs by nebulization every 4 (four) hours as needed. For shortness of breath and wheezing.   Marland Kitchen levothyroxine (SYNTHROID, LEVOTHROID) 50 MCG tablet Take 50 mcg by mouth every morning.   Marland Kitchen losartan (COZAAR) 100 MG tablet Take 100 mg  by mouth daily.  . Melatonin 3 MG TBDP Take 3 mg by mouth at bedtime.  . Menthol, Topical Analgesic, (BIOFREEZE) 4 % GEL Apply 1 application topically 2 (two) times daily as needed.  . Multiple Vitamin (MULTIVITAMIN WITH MINERALS) TABS tablet Take 1 tablet by mouth every morning.  . Nutritional Supplements (RESOURCE 2.0) LIQD Take 120 mLs by mouth 2 (two) times daily.  Marland Kitchen omeprazole (PRILOSEC) 20 MG capsule Take 20 mg by mouth daily.   . polyethylene glycol (MIRALAX / GLYCOLAX) packet Take 17 g by mouth at bedtime.   . predniSONE (DELTASONE) 5 MG tablet Take 5 mg by mouth daily with breakfast.  . tiotropium (SPIRIVA) 18 MCG inhalation capsule Place 18 mcg into inhaler and inhale daily. Inhale by taking 2 separate inhalations  . [DISCONTINUED] diltiazem (CARDIZEM CD) 120 MG 24 hr capsule Take 120 mg by mouth daily.   . [DISCONTINUED] traMADol (ULTRAM) 50 MG tablet Take 50 mg by mouth every 6 (six) hours as needed.   No facility-administered encounter  medications on file as of 01/11/2017.     Review of Systems  Constitutional: Negative for activity change, appetite change, chills, fatigue and fever.  HENT: Negative for congestion, postnasal drip, rhinorrhea, sinus pain, sinus pressure, sneezing and sore throat.   Eyes: Negative for discharge, redness, itching and visual disturbance.  Respiratory: Positive for cough, shortness of breath and wheezing. Negative for chest tightness.   Cardiovascular: Negative for chest pain, palpitations and leg swelling.  Gastrointestinal: Negative for abdominal distention, abdominal pain, constipation, diarrhea, nausea and vomiting.  Genitourinary: Negative for difficulty urinating, dysuria, flank pain, frequency and urgency.  Musculoskeletal: Positive for gait problem.  Skin: Negative for color change, pallor and rash.  Neurological: Negative for dizziness, seizures, syncope, light-headedness and headaches.  Hematological: Does not bruise/bleed easily.    Psychiatric/Behavioral: Positive for agitation, behavioral problems and confusion. The patient is not nervous/anxious.     Immunization History  Administered Date(s) Administered  . Influenza,inj,Quad PF,6+ Mos 12/19/2013  . Influenza-Unspecified 01/21/2013, 12/28/2014, 01/03/2016, 12/31/2016  . PPD Test 12/20/2013, 01/02/2014  . Pneumococcal Conjugate-13 07/25/2016  . Pneumococcal Polysaccharide-23 11/24/1999  . Td 12/28/1994  . Tdap 04/08/2013   Pertinent  Health Maintenance Due  Topic Date Due  . INFLUENZA VACCINE  Completed  . DEXA SCAN  Completed  . PNA vac Low Risk Adult  Completed   Fall Risk  10/26/2016 12/03/2014 06/08/2014  Falls in the past year? Yes Yes No  Number falls in past yr: 2 or more 1 -  Injury with Fall? No No -  Risk for fall due to : - History of fall(s);Impaired balance/gait;Impaired mobility Impaired balance/gait  Follow up - Falls evaluation completed -    Vitals:   01/11/17 1140  BP: (!) 146/79  Pulse: 88  Resp: 20  Temp: 97.6 F (36.4 C)  SpO2: 95%  Weight: 123 lb (55.8 kg)  Height: 5\' 2"  (1.575 m)   Body mass index is 22.5 kg/m. Physical Exam  Constitutional: She is oriented to person, place, and time. She appears well-developed and well-nourished.  Elderly in no acute distress   HENT:  Head: Normocephalic.  Right Ear: External ear normal.  Left Ear: External ear normal.  Mouth/Throat: Oropharynx is clear and moist. No oropharyngeal exudate.  Eyes: Pupils are equal, round, and reactive to light. Conjunctivae and EOM are normal. Right eye exhibits no discharge. Left eye exhibits no discharge. No scleral icterus.  Neck: Normal range of motion. No JVD present. No thyromegaly present.  Cardiovascular: Normal rate, regular rhythm, normal heart sounds and intact distal pulses.  Exam reveals no gallop and no friction rub.   No murmur heard. Pulmonary/Chest: Effort normal. No respiratory distress. She exhibits no tenderness.  Bilateral  expiration wheezes with diminished lung noted   Abdominal: Soft. Bowel sounds are normal. She exhibits no distension. There is no tenderness. There is no rebound and no guarding.  Musculoskeletal: She exhibits no edema or tenderness.  Moves x 4 extremities.Unsteady gait.   Neurological: She is oriented to person, place, and time. Coordination normal.  Scored 27/30 on MMSE 04/27/2016  Skin: Skin is warm and dry. No rash noted. No erythema. No pallor.  Psychiatric:  Easily irritable and verbally abusive.    Labs reviewed:  Recent Labs  05/07/16 11/02/16 11/26/16  NA 141 140 139  K 4.6 4.7 4.5  BUN 29* 32* 31*  CREATININE 0.8 1.0 0.8    Recent Labs  05/07/16 11/26/16  AST 18 16  ALT 11 12  ALKPHOS 81 85  Recent Labs  05/07/16 11/06/16 1238 11/26/16  WBC 7.2 9.5 7.2  NEUTROABS  --  7.7*  --   HGB 17.8* 18.2* 18.0*  HCT 53* 55.1* 53*  MCV  --  102.3*  --   PLT 321 363 333   Lab Results  Component Value Date   TSH 1.91 11/05/2016   Lab Results  Component Value Date   HGBA1C 5.8 10/01/2016   Lab Results  Component Value Date   CHOL 148 10/01/2016   HDL 69 10/01/2016   LDLCALC 64 10/01/2016   TRIG 73 10/01/2016    Significant Diagnostic Results in last 30 days:  No results found.  Assessment/Plan 1. Chronic obstructive pulmonary disease with acute exacerbation Afebrile. Bilateral expiration wheezes and diminished lung bases noted. Change Duoneb 0.5-2.5 (3) mg/3 ml inhale 3 Mls via nebulizer every 6 hours for shortness of breath or wheezing. Continue on Prednisone 5 mg tablet daily, Spiriva 18 mcg inhalation capsule and albuterol 108 mcg/inhaler. Obtain portable CXR Pa/Lateral to rule out pneumonia. Stat CBC/diiff and CMP to rule out infectious etiology.Continue to monitor.    2. Agitation Afebrile.Easily irritable and verbally abusive during visit and towards facility staff.Negative for signs or symptoms of UTI. Will discontinue current divalproex 125 mg capsule  then start divalproex 125 mg capsule one by mouth daily at 10 AM and two capsule at 4 PM.Obtain Valproic acid and ammonia level 01/18/2017.continue to monitor.   Family/ staff Communication: Reviewed plan of care with patient and facility Nurse supervisor  Labs/tests ordered:Stat CBC/diiff and CMP.Valproic acid and ammonia level 01/18/2017. portable CXR Pa/Lateral to rule out pneumonia.  Sandrea Hughs, NP

## 2017-01-12 ENCOUNTER — Encounter: Payer: Self-pay | Admitting: *Deleted

## 2017-01-14 DIAGNOSIS — M6281 Muscle weakness (generalized): Secondary | ICD-10-CM | POA: Diagnosis not present

## 2017-01-14 DIAGNOSIS — J449 Chronic obstructive pulmonary disease, unspecified: Secondary | ICD-10-CM | POA: Diagnosis not present

## 2017-01-14 DIAGNOSIS — E870 Hyperosmolality and hypernatremia: Secondary | ICD-10-CM | POA: Diagnosis not present

## 2017-01-14 LAB — BASIC METABOLIC PANEL
BUN: 32 — AB (ref 4–21)
Creatinine: 0.9 (ref 0.5–1.1)
GLUCOSE: 111
POTASSIUM: 4.4 (ref 3.4–5.3)
SODIUM: 144 (ref 137–147)

## 2017-01-18 ENCOUNTER — Non-Acute Institutional Stay (SKILLED_NURSING_FACILITY): Payer: Medicare Other | Admitting: Family

## 2017-01-18 ENCOUNTER — Encounter: Payer: Self-pay | Admitting: Family

## 2017-01-18 DIAGNOSIS — R609 Edema, unspecified: Secondary | ICD-10-CM | POA: Diagnosis not present

## 2017-01-18 DIAGNOSIS — I110 Hypertensive heart disease with heart failure: Secondary | ICD-10-CM

## 2017-01-18 DIAGNOSIS — I509 Heart failure, unspecified: Secondary | ICD-10-CM

## 2017-01-18 NOTE — Progress Notes (Signed)
Location:  [facility] Room Number: 10 Place of Service:  SNF (31) Provider: Leaner Morici FNP-C  Blanchie Serve, MD  Patient Care Team: Blanchie Serve, MD as PCP - General (Internal Medicine) Gaynelle Arabian, MD as Consulting Physician (Orthopedic Surgery) [name], [place] (Skilled Nursing and [place]) Hermela Hardt, Nelda Bucks, NP as Nurse Practitioner (Family Medicine)  Extended Emergency Contact Information Primary Emergency Contact: Johnston,Betsy Address: [place], [place] Phone: [phone] Relation: Daughter Secondary Emergency Contact: Rey,Lindsey  [place] Phone: [phone] Relation: Daughter  Code Status:  DNR Goals of care: Advanced Directive information Advanced Directives 01/18/2017  Does Patient Have a Medical Advance Directive? Yes  Type of Advance Directive Out of facility DNR (pink MOST or yellow form);Living will;Healthcare Power of Attorney  Does patient want to make changes to medical advance directive? -  Copy of [place] in Chart? Yes  Pre-existing out of facility DNR order (yellow form or pink MOST form) Yellow form placed in chart (order not valid for inpatient use);Pink MOST form placed in chart (order not valid for inpatient use)     Chief Complaint  Patient presents with  . Acute Visit    High blood pressures in the AM    HPI:  Pt is a 83 y.o. female seen today at [facility] for an acute visit for evaluation of high blood pressure.She is seen in her room today per facility Nurse request. Nurse reports patient's blood pressure running high in the mornings prior to giving medications but improves during the day. Patient states has had headache at times.She denies any dizziness, blurry vision,chest pain, nausea or vomiting. She is currently on oral antibiotic for recent pneumonia.she denies any fever or chills this  visit.B/p log reviewed B/p readings ranging in the 160's/70's-180's/80's with HR 60's-80's.       Past Medical History:  Diagnosis Date  . Back pain, thoracic 01/12/2014  . Cancer ([place]) 1980   Breast cancer  . Constipation 11/18/2012  . COPD (chronic obstructive pulmonary disease) ([place])   . CVA (cerebral infarction) 06/08/2011   Following abdominal surgery for bowel obstruction   . Depression 12/26/2013  . DVT (deep venous thrombosis) ([place]) 11/18/2012  . Fall 02/07/2013  . GERD (gastroesophageal reflux disease) 03/30/2014  . Granuloma annulare 06/08/2014  . HTN (hypertension) 11/18/2012   01/15/14 Bun/creat 14/0.60   . Hypertension   . Hypothyroidism   . Incisional hernia, without obstruction or gangrene 05/27/2014   Lower abd from previous surgery-2-3 small incisional hernias which are reducible-no incarceration or pain complained.    . Insomnia 11/18/2012  . Left hemiparesis ([place]) 06/08/2014  . Osteoporosis   . Shortness of breath   . Thoracic spine fracture [facility]) 12/18/2013   01/05/14 T 10 Dr. Newman Pies: PCP managing pain.  F/u 2 months(suggested that if the pain is "not too bad" and seems to be improving that she "live with it" and give a change to heal without intervention.  12/18/13 X-ray Thoracic spine: Compression deformity at the level of T10. Comparing back to a chest x-ray from 12/09/2013, this appears new in the interval.       . TIA (transient ischemic attack)   . Ventral hernia 06/08/2014   Past Surgical History:  Procedure Laterality Date  . ABDOMINAL HYSTERECTOMY    . CATARACT EXTRACTION, BILATERAL    . EXPLORATORY LAPAROTOMY  W/ BOWEL RESECTION     10/19/12-11/01/12 [facility] for exploratory laparotomty with extensive lysis of adhesion and segmental small bowel resection with end to end anastomosis per Dr. Isidore Moos 10/22/12  . MASTECTOMY  1980   left side  . TONSILLECTOMY    . TOTAL KNEE ARTHROPLASTY  2010   left knee    Allergies  Allergen Reactions  . Cortisone      Outpatient Encounter Prescriptions as of 01/18/2017  Medication Sig  . acetaminophen (TYLENOL) 500 MG tablet Take 500 mg by mouth daily. At 2 Pm and 1000 mg tablet twice daily at 8Am and 9 PM  . albuterol (VENTOLIN HFA) 108 (90 Base) MCG/ACT inhaler Inhale 2 puffs into the lungs daily. Take 2 puffs Every 6 hours as needed for shortness of breath and wheezing  . antiseptic oral rinse (BIOTENE) LIQD 15 mLs by Mouth Rinse route daily as needed for dry mouth.   Marland Kitchen aspirin 81 MG chewable tablet Chew 81 mg by mouth every morning.  . bisacodyl (DULCOLAX) 10 MG suppository Place 10 mg rectally every three (3) days as needed for moderate constipation.  . carvedilol (COREG) 25 MG tablet Take 25 mg by mouth 2 (two) times daily with a meal. Hold if SBP <110 and HR <60/min  . cloNIDine (CATAPRES) 0.1 MG tablet Take 0.1 mg by mouth every morning.   Marland Kitchen Dextran 70-Hypromellose (ARTIFICIAL TEARS) 0.1-0.3 % SOLN Apply 1 drop to eye daily. Both eyes daily and up to four times daily as needed for burning.  Marland Kitchen Dextromethorphan-Guaifenesin (TUSSIN DM) 10-100 MG/5ML liquid Take by mouth. Give 10 ml every 6 hours as needed for cough  . divalproex (DEPAKOTE SPRINKLE) 125 MG capsule Take 125 mg by mouth as directed. Take one capsule at 10AM and 2 capsules at 4PM  . doxycycline (VIBRAMYCIN) 100 MG capsule Take 100 mg by mouth 2 (two) times daily.  . DULoxetine (CYMBALTA) 20 MG capsule Take 40 mg by mouth daily.   . furosemide (LASIX) 40 MG tablet Take 40 mg by mouth daily.  . Glycerin, Laxative, (PEDIA-LAX) 2.8 g SUPP Place rectally daily as needed (for constipation).   . hydrALAZINE (APRESOLINE) 50 MG tablet Take 50 mg by mouth 2 (two) times daily. Hold for SBP < 110  . ipratropium-albuterol (DUONEB) 0.5-2.5 (3) MG/3ML SOLN Take 3 mLs by nebulization every 6 (six) hours as needed. For shortness of breath and wheezing.   Marland Kitchen levothyroxine (SYNTHROID, LEVOTHROID) 50 MCG tablet Take 50 mcg by mouth every morning.   Marland Kitchen  losartan (COZAAR) 100 MG tablet Take 100 mg by mouth daily.  . Melatonin 3 MG TBDP Take 3 mg by mouth at bedtime.  . Menthol, Topical Analgesic, (BIOFREEZE) 4 % GEL Apply 1 application topically 2 (two) times daily as needed.  . Multiple Vitamin (MULTIVITAMIN WITH MINERALS) TABS tablet Take 1 tablet by mouth every morning.  . Nutritional Supplements (RESOURCE 2.0) LIQD Take 120 mLs by mouth 2 (two) times daily.  Marland Kitchen omeprazole (PRILOSEC) 20 MG capsule Take 20 mg by mouth daily.   . polyethylene glycol (MIRALAX / GLYCOLAX) packet Take 17 g by mouth at bedtime.   . predniSONE (DELTASONE) 5 MG tablet Take 5 mg by mouth daily with breakfast.  . saccharomyces boulardii (FLORASTOR) 250 MG capsule Take 250 mg by mouth 2 (two) times daily.  Marland Kitchen tiotropium (SPIRIVA) 18 MCG inhalation capsule Place 18 mcg into inhaler and inhale daily. Inhale by taking 2 separate inhalations   No facility-administered encounter medications on file  as of 01/18/2017.     Review of Systems  Constitutional: Negative for activity change, chills, fatigue and fever.  HENT: Positive for hearing loss. Negative for congestion, rhinorrhea, sinus pain, sinus pressure, sneezing and sore throat.   Eyes: Negative for discharge, redness and visual disturbance.  Respiratory: Positive for cough. Negative for chest tightness, shortness of breath and wheezing.   Cardiovascular: Positive for leg swelling. Negative for chest pain and palpitations.  Gastrointestinal: Negative for abdominal distention, abdominal pain, constipation, diarrhea, nausea and vomiting.  Endocrine: Negative for cold intolerance and heat intolerance.  Musculoskeletal: Positive for gait problem.  Skin: Negative for color change, pallor and rash.  Neurological: Negative for dizziness, seizures, syncope, light-headedness and headaches.  Psychiatric/Behavioral: Negative for agitation, confusion, hallucinations and sleep disturbance. The patient is not nervous/anxious.      Immunization History  Administered Date(s) Administered  . Influenza,inj,Quad PF,6+ Mos 12/19/2013  . Influenza-Unspecified 01/21/2013, 12/28/2014, 01/03/2016, 12/31/2016  . PPD Test 12/20/2013, 01/02/2014  . Pneumococcal Conjugate-13 07/25/2016  . Pneumococcal Polysaccharide-23 11/24/1999  . Td 12/28/1994  . Tdap 04/08/2013   Pertinent  Health Maintenance Due  Topic Date Due  . INFLUENZA VACCINE  Completed  . DEXA SCAN  Completed  . PNA vac Low Risk Adult  Completed   Fall Risk  10/26/2016 12/03/2014 06/08/2014  Falls in the past year? Yes Yes No  Number falls in past yr: 2 or more 1 -  Injury with Fall? No No -  Risk for fall due to : - History of fall(s);Impaired balance/gait;Impaired mobility Impaired balance/gait  Follow up - Falls evaluation completed -    Vitals:   01/18/17 1155  BP: (!) 178/80  Pulse: 82  Resp: 20  Temp: (!) 96.9 F (36.1 C)  SpO2: 92%  Weight: 123 lb (55.8 kg)  Height: 5\' 2"  (1.575 m)   Body mass index is 22.5 kg/m. Physical Exam  Constitutional: She appears well-developed and well-nourished.  Elderly in no acute distress   HENT:  Head: Normocephalic.  Mouth/Throat: Oropharynx is clear and moist. No oropharyngeal exudate.  Eyes: Pupils are equal, round, and reactive to light. Conjunctivae and EOM are normal. Right eye exhibits no discharge. Left eye exhibits no discharge. No scleral icterus.  Neck: Normal range of motion. No JVD present. No thyromegaly present.  Cardiovascular: Normal rate, regular rhythm, normal heart sounds and intact distal pulses.   Pulmonary/Chest: Effort normal and breath sounds normal. No respiratory distress. She has no wheezes. She has no rales.  Abdominal: Soft. Bowel sounds are normal. She exhibits no distension. There is no tenderness. There is no rebound and no guarding.  Musculoskeletal: She exhibits no tenderness.  Unsteady gait uses walker. Bilateral lower extremities 1-2+edema left ankle > right.    Lymphadenopathy:    She has no cervical adenopathy.  Neurological: Coordination normal.  Alert and oriented to person and place.   Skin: Skin is warm and dry. No rash noted. No erythema. No pallor.  Psychiatric: She has a normal mood and affect.   Labs reviewed:  Recent Labs  11/02/16 11/26/16 01/11/17  NA 140 139 142  K 4.7 4.5 4.7  BUN 32* 31* 28*  CREATININE 1.0 0.8 0.9    Recent Labs  05/07/16 11/26/16 01/11/17  AST 18 16 23   ALT 11 12 23   ALKPHOS 81 85 81    Recent Labs  11/06/16 1238 11/26/16 01/11/17  WBC 9.5 7.2 9.5  NEUTROABS 7.7*  --   --   HGB 18.2* 18.0* 17.1*  HCT 55.1* 53* 50*  MCV 102.3*  --   --   PLT 363 333 264   Lab Results  Component Value Date   TSH 1.91 11/05/2016   Lab Results  Component Value Date   HGBA1C 5.8 10/01/2016   Lab Results  Component Value Date   CHOL 148 10/01/2016   HDL 69 10/01/2016   LDLCALC 64 10/01/2016   TRIG 73 10/01/2016    Significant Diagnostic Results in last 30 days:  No results found.  Assessment/Plan 1. Essential hypertension B/p log reviewed B/p readings ranging in the 160's/70's-180's/80's with HR 60's-80's.exam finding negative for chest pain.continue on cozaar 100 mg tablet daily,Hydralazine 50 mg tablet twice daily, coreg 25 mg tablet twice daily and clonidine 0.1mg  tablet daily. Change furosemide 40 mg tablet to twice daily.Add potasium chloride 20 meq tablet daily.continue to monitor B/p. Recheck BMP 01/21/2017.   2. Congestive Heart Failure       Bilateral lower extremities edema. Shortness of breath with exertion Currnetly on oral antibiotics for recent pneumonia.Recent portable CXR consistent with congestive heart failure. Increase Furosemide 40 mg tablet to twice daily and add KCL as above.check Bnp 01/21/2017.Follow   3. Edema    Bilateral lower extremities. Increase furosemide and add KCL as above. Apply knee high ted hose on in the morning and off at bedtime.   Family/ staff Communication:  Reviewed plan of care with patient and facility Nurse  Labs/tests ordered: None   Nelda Bucks Channin Agustin, NP

## 2017-01-21 DIAGNOSIS — I279 Pulmonary heart disease, unspecified: Secondary | ICD-10-CM | POA: Diagnosis not present

## 2017-01-21 DIAGNOSIS — I6350 Cerebral infarction due to unspecified occlusion or stenosis of unspecified cerebral artery: Secondary | ICD-10-CM | POA: Diagnosis not present

## 2017-01-21 DIAGNOSIS — R0602 Shortness of breath: Secondary | ICD-10-CM | POA: Diagnosis not present

## 2017-01-21 DIAGNOSIS — E039 Hypothyroidism, unspecified: Secondary | ICD-10-CM | POA: Diagnosis not present

## 2017-01-21 LAB — BASIC METABOLIC PANEL
BUN: 35 — AB (ref 4–21)
CREATININE: 1.1 (ref 0.5–1.1)
Glucose: 112
Potassium: 4.3 (ref 3.4–5.3)
Sodium: 144 (ref 137–147)

## 2017-01-25 DIAGNOSIS — J189 Pneumonia, unspecified organism: Secondary | ICD-10-CM | POA: Diagnosis not present

## 2017-01-26 ENCOUNTER — Non-Acute Institutional Stay (SKILLED_NURSING_FACILITY): Payer: Medicare Other | Admitting: Family

## 2017-01-26 ENCOUNTER — Encounter: Payer: Self-pay | Admitting: Family

## 2017-01-26 DIAGNOSIS — J449 Chronic obstructive pulmonary disease, unspecified: Secondary | ICD-10-CM

## 2017-01-26 DIAGNOSIS — R5381 Other malaise: Secondary | ICD-10-CM

## 2017-01-26 DIAGNOSIS — I110 Hypertensive heart disease with heart failure: Secondary | ICD-10-CM | POA: Diagnosis not present

## 2017-01-26 DIAGNOSIS — R638 Other symptoms and signs concerning food and fluid intake: Secondary | ICD-10-CM

## 2017-01-26 NOTE — Progress Notes (Signed)
Location:  [facility] Room Number: 10 Place of Service:  SNF (31) Provider: Drezden Seitzinger FNP-C  Blanchie Serve, MD  Patient Care Team: Blanchie Serve, MD as PCP - General (Internal Medicine) Gaynelle Arabian, MD as Consulting Physician (Orthopedic Surgery) [name], [place] (Skilled Nursing and [place]) Sabriel Borromeo, Nelda Bucks, NP as Nurse Practitioner (Family Medicine)  Extended Emergency Contact Information Primary Emergency Contact: Johnston,Betsy Address: [place], [place] Phone: [phone] Relation: Daughter Secondary Emergency Contact: Rey,Lindsey  [place] Phone: [phone] Relation: Daughter  Code Status:  DNR Goals of care: Advanced Directive information Advanced Directives 01/26/2017  Does Patient Have a Medical Advance Directive? Yes  Type of Advance Directive Out of facility DNR (pink MOST or yellow form);Living will;Healthcare Power of Attorney  Does patient want to make changes to medical advance directive? -  Copy of [place] in Chart? Yes  Pre-existing out of facility DNR order (yellow form or pink MOST form) Yellow form placed in chart (order not valid for inpatient use);Pink MOST form placed in chart (order not valid for inpatient use)     Chief Complaint  Patient presents with  . Acute Visit    family would like hospice consult    HPI:  Pt is a 83 y.o. female seen today at [facility] for an acute visit for evaluation of worsening condition.She has a medical history of HTN,CHF,COPD,Hypothyroidism,CVA with left hemiparesis, Depression among other conditions.She is seen in her room today with facility Nurse and daughter at bedside.Patient's daughter states patient's condition continues to decline and not eating currently on palliative care would like Hospice referral.Patient in agreement with referral to Hospice service.Of  note she is status post pneumonia treatment 01/11/2017 was treat with oral doxycycline twice daily x 10 days.she continues to complain of shortness of breath with exertion though was able to walk to the bathroom without her oxygen via nasal cannula. She denies any fever or chills.  Past Medical History:  Diagnosis Date  . Back pain, thoracic 01/12/2014  . Cancer ([place]) 1980   Breast cancer  . Constipation 11/18/2012  . COPD (chronic obstructive pulmonary disease) ([place])   . CVA (cerebral infarction) 06/08/2011   Following abdominal surgery for bowel obstruction   . Depression 12/26/2013  . DVT (deep venous thrombosis) ([place]) 11/18/2012  . Fall 02/07/2013  . GERD (gastroesophageal reflux disease) 03/30/2014  . Granuloma annulare 06/08/2014  . HTN (hypertension) 11/18/2012   01/15/14 Bun/creat 14/0.60   . Hypertension   . Hypothyroidism   . Incisional hernia, without obstruction or gangrene 05/27/2014   Lower abd from previous surgery-2-3 small incisional hernias which are reducible-no incarceration or pain complained.    . Insomnia 11/18/2012  . Left hemiparesis ([place]) 06/08/2014  . Osteoporosis   . Shortness of breath   . Thoracic spine fracture [facility]) 12/18/2013   01/05/14 T 10 Dr. Newman Pies: PCP managing pain.  F/u 2 months(suggested that if the pain is "not too bad" and seems to be improving that she "live with it" and give a change to heal without intervention.  12/18/13 X-ray Thoracic spine: Compression deformity at the level of T10. Comparing back to a chest x-ray from 12/09/2013, this appears new in the interval.       . TIA (transient ischemic attack)   . Ventral hernia 06/08/2014   Past Surgical History:  Procedure  Laterality Date  . ABDOMINAL HYSTERECTOMY    . CATARACT EXTRACTION, BILATERAL    . EXPLORATORY LAPAROTOMY W/ BOWEL RESECTION     10/19/12-11/01/12 [facility] for exploratory laparotomty with extensive lysis of adhesion and segmental small bowel resection with end to end  anastomosis per Dr. Isidore Moos 10/22/12  . MASTECTOMY  1980   left side  . TONSILLECTOMY    . TOTAL KNEE ARTHROPLASTY  2010   left knee    Allergies  Allergen Reactions  . Cortisone     Outpatient Encounter Medications as of 01/26/2017  Medication Sig  . acetaminophen (TYLENOL) 500 MG tablet Take 500 mg by mouth daily. At 2 Pm and 1000 mg tablet twice daily at 8Am and 9 PM  . albuterol (VENTOLIN HFA) 108 (90 Base) MCG/ACT inhaler Inhale 2 puffs into the lungs daily. Take 2 puffs Every 6 hours as needed for shortness of breath and wheezing  . antiseptic oral rinse (BIOTENE) LIQD 15 mLs by Mouth Rinse route daily as needed for dry mouth.   Marland Kitchen aspirin 81 MG chewable tablet Chew 81 mg by mouth every morning.  . bisacodyl (DULCOLAX) 10 MG suppository Place 10 mg rectally every three (3) days as needed for moderate constipation.  . carvedilol (COREG) 25 MG tablet Take 25 mg by mouth 2 (two) times daily with a meal. Hold if SBP <110 and HR <60/min  . cloNIDine (CATAPRES) 0.1 MG tablet Take 0.1 mg by mouth every morning.   Marland Kitchen Dextran 70-Hypromellose (ARTIFICIAL TEARS) 0.1-0.3 % SOLN Apply 1 drop to eye daily. Both eyes daily and up to four times daily as needed for burning.  Marland Kitchen Dextromethorphan-Guaifenesin (TUSSIN DM) 10-100 MG/5ML liquid Take by mouth. Give 10 ml every 6 hours as needed for cough  . divalproex (DEPAKOTE SPRINKLE) 125 MG capsule Take 125 mg by mouth as directed. Take one capsule at 10AM and 2 capsules at 4PM  . DULoxetine (CYMBALTA) 20 MG capsule Take 40 mg by mouth daily.   . furosemide (LASIX) 40 MG tablet Take 40 mg 2 (two) times daily by mouth.   . Glycerin, Laxative, (PEDIA-LAX) 2.8 g SUPP Place rectally daily as needed (for constipation).   . hydrALAZINE (APRESOLINE) 50 MG tablet Take 50 mg by mouth 2 (two) times daily. Hold for SBP < 110  . ipratropium-albuterol (DUONEB) 0.5-2.5 (3) MG/3ML SOLN Take 3 mLs every 6 (six) hours by nebulization. For shortness of breath and wheezing.     Marland Kitchen levothyroxine (SYNTHROID, LEVOTHROID) 50 MCG tablet Take 50 mcg by mouth every morning.   Marland Kitchen losartan (COZAAR) 100 MG tablet Take 100 mg by mouth daily.  . Melatonin 3 MG TBDP Take 3 mg by mouth at bedtime.  . Menthol, Topical Analgesic, (BIOFREEZE) 4 % GEL Apply 1 application topically 2 (two) times daily as needed.  . Multiple Vitamin (MULTIVITAMIN WITH MINERALS) TABS tablet Take 1 tablet by mouth every morning.  . Nutritional Supplements (RESOURCE 2.0) LIQD Take 120 mLs by mouth 2 (two) times daily.  Marland Kitchen omeprazole (PRILOSEC) 20 MG capsule Take 20 mg by mouth daily.   . polyethylene glycol (MIRALAX / GLYCOLAX) packet Take 17 g by mouth at bedtime.   . predniSONE (DELTASONE) 5 MG tablet Take 5 mg by mouth daily with breakfast.  . tiotropium (SPIRIVA) 18 MCG inhalation capsule Place 18 mcg into inhaler and inhale daily. Inhale by taking 2 separate inhalations   No facility-administered encounter medications on file as of 01/26/2017.     Review of Systems  Constitutional: Positive for appetite change and fatigue. Negative for chills and fever.  HENT: Negative for congestion, rhinorrhea, sinus pressure, sinus pain, sneezing and sore throat.   Eyes: Negative for discharge, redness and itching.       Wears eye glasses   Respiratory: Negative for cough, chest tightness, shortness of breath and wheezing.   Cardiovascular: Negative for chest pain, palpitations and leg swelling.  Gastrointestinal: Negative for abdominal distention, abdominal pain, constipation, diarrhea, nausea and vomiting.       LBM 01/26/2017  Endocrine: Negative for cold intolerance, heat intolerance, polydipsia, polyphagia and polyuria.  Genitourinary: Negative for dysuria, flank pain, frequency and urgency.  Musculoskeletal: Positive for gait problem.  Skin: Negative for color change, pallor and rash.  Neurological: Negative for dizziness, syncope, light-headedness, numbness and headaches.       Generalized weakness      Immunization History  Administered Date(s) Administered  . Influenza,inj,Quad PF,6+ Mos 12/19/2013  . Influenza-Unspecified 01/21/2013, 12/28/2014, 01/03/2016, 12/31/2016  . PPD Test 12/20/2013, 01/02/2014  . Pneumococcal Conjugate-13 07/25/2016  . Pneumococcal Polysaccharide-23 11/24/1999  . Td 12/28/1994  . Tdap 04/08/2013   Pertinent  Health Maintenance Due  Topic Date Due  . INFLUENZA VACCINE  Completed  . DEXA SCAN  Completed  . PNA vac Low Risk Adult  Completed   Fall Risk  10/26/2016 12/03/2014 06/08/2014  Falls in the past year? Yes Yes No  Number falls in past yr: 2 or more 1 -  Injury with Fall? No No -  Risk for fall due to : - History of fall(s);Impaired balance/gait;Impaired mobility Impaired balance/gait  Follow up - Falls evaluation completed -    Vitals:   01/26/17 1056  BP: (!) 160/70  Pulse: 72  Resp: (!) 72  Temp: (!) 97.3 F (36.3 C)  SpO2: 97%  Weight: 123 lb (55.8 kg)  Height: 5\' 2"  (1.575 m)   Body mass index is 22.5 kg/m. Physical Exam  Constitutional: She appears well-developed.  Elderly in no acute distress   HENT:  Head: Normocephalic.  Mouth/Throat: Oropharynx is clear and moist. No oropharyngeal exudate.  Eyes: Conjunctivae and EOM are normal. Pupils are equal, round, and reactive to light. Right eye exhibits no discharge. Left eye exhibits no discharge. No scleral icterus.  Neck: Normal range of motion. No JVD present. No thyromegaly present.  Cardiovascular: Normal rate, regular rhythm, normal heart sounds and intact distal pulses. Exam reveals no gallop and no friction rub.  No murmur heard. Pulmonary/Chest: Effort normal and breath sounds normal. No respiratory distress. She has no wheezes. She has no rales.  Bilateral diminished lung bases   Abdominal: Soft. Bowel sounds are normal. She exhibits no distension. There is no tenderness. There is no rebound and no guarding.  Musculoskeletal: She exhibits no tenderness.  Moves x 4  extremities.Unsteady gait uses FWW. Left leg  Chronic edema trace-1+.    Lymphadenopathy:    She has no cervical adenopathy.  Neurological:  Alert and oriented to person and place.   Skin: Skin is warm and dry. No rash noted. No erythema.  Psychiatric: She has a normal mood and affect.   Labs reviewed: Recent Labs    11/02/16 11/26/16 01/11/17  NA 140 139 142  K 4.7 4.5 4.7  BUN 32* 31* 28*  CREATININE 1.0 0.8 0.9   Recent Labs    05/07/16 11/26/16 01/11/17  AST 18 16 23   ALT 11 12 23   ALKPHOS 81 85 81   Recent Labs  11/06/16 1238 11/26/16 01/11/17  WBC 9.5 7.2 9.5  NEUTROABS 7.7*  --   --   HGB 18.2* 18.0* 17.1*  HCT 55.1* 53* 50*  MCV 102.3*  --   --   PLT 363 333 264   Lab Results  Component Value Date   TSH 1.91 11/05/2016   Lab Results  Component Value Date   HGBA1C 5.8 10/01/2016   Lab Results  Component Value Date   CHOL 148 10/01/2016   HDL 69 10/01/2016   LDLCALC 64 10/01/2016   TRIG 73 10/01/2016    Significant Diagnostic Results in last 30 days:  No results found.  Assessment/Plan 1. Physical deconditioning Afebrile. Has progressive decline in condition with generalized weakness. Possible due to recent pneumonia and her poor oral intake.Has a DNR with MOST form  in the chart. POA [facility] present during visit request referral to Hospice service due to decline in condition.   2. Decreased oral intake Poor oral intake with meals.will refer to Hospice service as above.   3. Chronic obstructive pulmonary disease Afebrile. Reports shortness of breath with exertion.Bilateral lower lung bases diminished. Continue on DuoNebs.   4. HTN with CHF B/p elevated this visit.SBP log within range.No recent abrupt increase in  Weight or edema. Lungs diminished on bases. continue on Cozaar 100 mg tablet daily and Furosemide 40 mg tablet twice daily.   Family/ staff Communication: Reviewed plan of care with patient and facility Nurse  Labs/tests ordered:  None   Nelda Bucks Lexiana Spindel, NP

## 2017-01-29 DIAGNOSIS — H11149 Conjunctival xerosis, unspecified, unspecified eye: Secondary | ICD-10-CM | POA: Diagnosis not present

## 2017-01-29 DIAGNOSIS — F339 Major depressive disorder, recurrent, unspecified: Secondary | ICD-10-CM | POA: Diagnosis not present

## 2017-01-29 DIAGNOSIS — R0902 Hypoxemia: Secondary | ICD-10-CM | POA: Diagnosis not present

## 2017-01-29 DIAGNOSIS — E039 Hypothyroidism, unspecified: Secondary | ICD-10-CM | POA: Diagnosis not present

## 2017-01-29 DIAGNOSIS — I679 Cerebrovascular disease, unspecified: Secondary | ICD-10-CM | POA: Diagnosis not present

## 2017-01-29 DIAGNOSIS — I69954 Hemiplegia and hemiparesis following unspecified cerebrovascular disease affecting left non-dominant side: Secondary | ICD-10-CM | POA: Diagnosis not present

## 2017-01-29 DIAGNOSIS — I10 Essential (primary) hypertension: Secondary | ICD-10-CM | POA: Diagnosis not present

## 2017-01-29 DIAGNOSIS — R451 Restlessness and agitation: Secondary | ICD-10-CM | POA: Diagnosis not present

## 2017-01-29 DIAGNOSIS — K219 Gastro-esophageal reflux disease without esophagitis: Secondary | ICD-10-CM | POA: Diagnosis not present

## 2017-01-29 DIAGNOSIS — R54 Age-related physical debility: Secondary | ICD-10-CM | POA: Diagnosis not present

## 2017-01-29 DIAGNOSIS — I509 Heart failure, unspecified: Secondary | ICD-10-CM | POA: Diagnosis not present

## 2017-01-29 DIAGNOSIS — J449 Chronic obstructive pulmonary disease, unspecified: Secondary | ICD-10-CM | POA: Diagnosis not present

## 2017-01-30 DIAGNOSIS — I509 Heart failure, unspecified: Secondary | ICD-10-CM | POA: Diagnosis not present

## 2017-01-30 DIAGNOSIS — J449 Chronic obstructive pulmonary disease, unspecified: Secondary | ICD-10-CM | POA: Diagnosis not present

## 2017-01-30 DIAGNOSIS — R0902 Hypoxemia: Secondary | ICD-10-CM | POA: Diagnosis not present

## 2017-01-30 DIAGNOSIS — I69954 Hemiplegia and hemiparesis following unspecified cerebrovascular disease affecting left non-dominant side: Secondary | ICD-10-CM | POA: Diagnosis not present

## 2017-01-30 DIAGNOSIS — I10 Essential (primary) hypertension: Secondary | ICD-10-CM | POA: Diagnosis not present

## 2017-01-30 DIAGNOSIS — I679 Cerebrovascular disease, unspecified: Secondary | ICD-10-CM | POA: Diagnosis not present

## 2017-02-01 DIAGNOSIS — R0902 Hypoxemia: Secondary | ICD-10-CM | POA: Diagnosis not present

## 2017-02-01 DIAGNOSIS — I679 Cerebrovascular disease, unspecified: Secondary | ICD-10-CM | POA: Diagnosis not present

## 2017-02-01 DIAGNOSIS — I509 Heart failure, unspecified: Secondary | ICD-10-CM | POA: Diagnosis not present

## 2017-02-01 DIAGNOSIS — J449 Chronic obstructive pulmonary disease, unspecified: Secondary | ICD-10-CM | POA: Diagnosis not present

## 2017-02-01 DIAGNOSIS — I10 Essential (primary) hypertension: Secondary | ICD-10-CM | POA: Diagnosis not present

## 2017-02-01 DIAGNOSIS — I69954 Hemiplegia and hemiparesis following unspecified cerebrovascular disease affecting left non-dominant side: Secondary | ICD-10-CM | POA: Diagnosis not present

## 2017-02-02 DIAGNOSIS — I10 Essential (primary) hypertension: Secondary | ICD-10-CM | POA: Diagnosis not present

## 2017-02-02 DIAGNOSIS — I679 Cerebrovascular disease, unspecified: Secondary | ICD-10-CM | POA: Diagnosis not present

## 2017-02-02 DIAGNOSIS — R0902 Hypoxemia: Secondary | ICD-10-CM | POA: Diagnosis not present

## 2017-02-02 DIAGNOSIS — I509 Heart failure, unspecified: Secondary | ICD-10-CM | POA: Diagnosis not present

## 2017-02-02 DIAGNOSIS — J449 Chronic obstructive pulmonary disease, unspecified: Secondary | ICD-10-CM | POA: Diagnosis not present

## 2017-02-02 DIAGNOSIS — I69954 Hemiplegia and hemiparesis following unspecified cerebrovascular disease affecting left non-dominant side: Secondary | ICD-10-CM | POA: Diagnosis not present

## 2017-02-04 ENCOUNTER — Encounter: Payer: Self-pay | Admitting: Family

## 2017-02-04 ENCOUNTER — Non-Acute Institutional Stay (SKILLED_NURSING_FACILITY): Payer: Medicare Other | Admitting: Family

## 2017-02-04 DIAGNOSIS — J449 Chronic obstructive pulmonary disease, unspecified: Secondary | ICD-10-CM

## 2017-02-04 DIAGNOSIS — R5381 Other malaise: Secondary | ICD-10-CM | POA: Diagnosis not present

## 2017-02-04 DIAGNOSIS — E039 Hypothyroidism, unspecified: Secondary | ICD-10-CM

## 2017-02-04 NOTE — Progress Notes (Signed)
Location:  [facility] Room Number: 10 Place of Service:  SNF (31) Provider: Tyvion Edmondson FNP-C   Blanchie Serve, MD  Patient Care Team: Blanchie Serve, MD as PCP - General (Internal Medicine) Gaynelle Arabian, MD as Consulting Physician (Orthopedic Surgery) [name], [place] (Skilled Nursing and [place]) Ruhi Kopke, Nelda Bucks, NP as Nurse Practitioner (Family Medicine)  Extended Emergency Contact Information Primary Emergency Contact: Johnston,Betsy Address: [place], [place] Phone: [phone] Relation: Daughter Secondary Emergency Contact: Rey,Lindsey  [place] Phone: [phone] Relation: Daughter  Code Status:  DNR Goals of care: Advanced Directive information Advanced Directives 02/04/2017  Does Patient Have a Medical Advance Directive? Yes  Type of Paramedic of [place];Out of facility DNR (pink MOST or yellow form);Living will  Does patient want to make changes to medical advance directive? -  Copy of [place] in Chart? Yes  Pre-existing out of facility DNR order (yellow form or pink MOST form) Pink MOST form placed in chart (order not valid for inpatient use);Yellow form placed in chart (order not valid for inpatient use)     Chief Complaint  Patient presents with  . Medical Management of Chronic Issues    routine visit    HPI:  Pt is a 83 y.o. female seen today [place] for medical management of chronic diseases.she has a medical history of HTN, CVA with left hemiparesis,CHF, COPD, GERD, Hypothyroidism,OA among other conditions. She is seen in her room today. She denies any acute issues this visit. Facility Nurse reports patient has had progressive generalized weakness and decreased oral intake.She is currently under hospice service.No recent fall episode. She has had a 5 pound weight loss over two  months.     Past Medical History:  Diagnosis Date  . Back pain, thoracic 01/12/2014  . Cancer ([place]) 1980   Breast cancer  . Constipation 11/18/2012  . COPD (chronic obstructive pulmonary disease) ([place])   . CVA (cerebral infarction) 06/08/2011   Following abdominal surgery for bowel obstruction   . Depression 12/26/2013  . DVT (deep venous thrombosis) ([place]) 11/18/2012  . Fall 02/07/2013  . GERD (gastroesophageal reflux disease) 03/30/2014  . Granuloma annulare 06/08/2014  . HTN (hypertension) 11/18/2012   01/15/14 Bun/creat 14/0.60   . Hypertension   . Hypothyroidism   . Incisional hernia, without obstruction or gangrene 05/27/2014   Lower abd from previous surgery-2-3 small incisional hernias which are reducible-no incarceration or pain complained.    . Insomnia 11/18/2012  . Left hemiparesis ([place]) 06/08/2014  . Osteoporosis   . Shortness of breath   . Thoracic spine fracture [facility]) 12/18/2013   01/05/14 T 10 Dr. Newman Pies: PCP managing pain.  F/u 2 months(suggested that if the pain is "not too bad" and seems to be improving that she "live with it" and give a change to heal without intervention.  12/18/13 X-ray Thoracic spine: Compression deformity at the level of T10. Comparing back to a chest x-ray from 12/09/2013, this appears new in the interval.       . TIA (transient ischemic attack)   . Ventral hernia 06/08/2014   Past Surgical History:  Procedure Laterality Date  . ABDOMINAL HYSTERECTOMY    . CATARACT EXTRACTION, BILATERAL    . EXPLORATORY LAPAROTOMY W/ BOWEL RESECTION     10/19/12-11/01/12 [facility] for exploratory laparotomty with extensive lysis of  adhesion and segmental small bowel resection with end to end anastomosis per Dr. Isidore Moos 10/22/12  . MASTECTOMY  1980   left side  . TONSILLECTOMY    . TOTAL KNEE ARTHROPLASTY  2010   left knee    Allergies  Allergen Reactions  . Cortisone     Allergies as of 02/04/2017      Reactions   Cortisone       Medication  List        Accurate as of 02/04/17  3:56 PM. Always use your most recent med list.          acetaminophen 500 MG tablet Commonly known as:  TYLENOL Take 500 mg by mouth daily. At 2 Pm and 1000 mg tablet twice daily at 8Am and 9 PM   antiseptic oral rinse Liqd 15 mLs by Mouth Rinse route daily as needed for dry mouth.   ARTIFICIAL TEARS 0.1-0.3 % Soln Generic drug:  Dextran 70-Hypromellose Apply 1 drop to eye daily. Both eyes daily and up to four times daily as needed for burning.   aspirin 81 MG chewable tablet Chew 81 mg by mouth every morning.   BIOFREEZE 4 % Gel Generic drug:  Menthol (Topical Analgesic) Apply 1 application topically 2 (two) times daily as needed.   bisacodyl 10 MG suppository Commonly known as:  DULCOLAX Place 10 mg rectally every three (3) days as needed for moderate constipation.   carvedilol 25 MG tablet Commonly known as:  COREG Take 25 mg by mouth 2 (two) times daily with a meal. Hold if SBP <110 and HR <60/min   cloNIDine 0.1 MG tablet Commonly known as:  CATAPRES Take 0.1 mg by mouth every morning.   divalproex 125 MG capsule Commonly known as:  DEPAKOTE SPRINKLE Take 125 mg by mouth as directed. Take one capsule at 10AM and 2 capsules at 4PM   DULoxetine 20 MG capsule Commonly known as:  CYMBALTA Take 40 mg by mouth daily.   furosemide 40 MG tablet Commonly known as:  LASIX Take 40 mg 2 (two) times daily by mouth.   hydrALAZINE 50 MG tablet Commonly known as:  APRESOLINE Take 50 mg by mouth 2 (two) times daily. Hold for SBP < 110   ipratropium-albuterol 0.5-2.5 (3) MG/3ML Soln Commonly known as:  DUONEB Take 3 mLs every 6 (six) hours by nebulization. For shortness of breath and wheezing.   levothyroxine 50 MCG tablet Commonly known as:  SYNTHROID, LEVOTHROID Take 50 mcg by mouth every morning.   losartan 100 MG tablet Commonly known as:  COZAAR Take 100 mg by mouth daily.   Melatonin 3 MG Tbdp Take 3 mg by mouth at  bedtime.   multivitamin with minerals Tabs tablet Take 1 tablet by mouth every morning.   omeprazole 20 MG capsule Commonly known as:  PRILOSEC Take 20 mg by mouth daily.   OXYGEN Inhale 2 L/min as needed into the lungs.   PEDIA-LAX 2.8 g Supp Generic drug:  Glycerin (Laxative) Place rectally daily as needed (for constipation).   polyethylene glycol packet Commonly known as:  MIRALAX / GLYCOLAX Take 17 g by mouth at bedtime.   predniSONE 5 MG tablet Commonly known as:  DELTASONE Take 5 mg by mouth daily with breakfast.   RESOURCE 2.0 Liqd Take 120 mLs by mouth 2 (two) times daily.   tiotropium 18 MCG inhalation capsule Commonly known as:  SPIRIVA Place 18 mcg into inhaler and inhale daily. Inhale by taking 2 separate inhalations  TUSSIN DM 10-100 MG/5ML liquid Generic drug:  Dextromethorphan-Guaifenesin Take by mouth. Give 10 ml every 6 hours as needed for cough   VENTOLIN HFA 108 (90 Base) MCG/ACT inhaler Generic drug:  albuterol Inhale 2 puffs into the lungs daily. Take 2 puffs Every 6 hours as needed for shortness of breath and wheezing       Review of Systems  Constitutional: Positive for appetite change. Negative for activity change, chills, fatigue and fever.  HENT: Positive for hearing loss. Negative for congestion, rhinorrhea, sinus pressure, sinus pain, sneezing and sore throat.   Eyes: Negative for discharge, redness and itching.  Respiratory: Negative for cough, chest tightness, shortness of breath and wheezing.   Cardiovascular: Negative for chest pain, palpitations and leg swelling.  Gastrointestinal: Negative for abdominal distention, abdominal pain, constipation, diarrhea, nausea and vomiting.  Endocrine: Negative for cold intolerance, heat intolerance, polydipsia, polyphagia and polyuria.  Genitourinary: Negative for dysuria, flank pain, frequency and urgency.  Musculoskeletal: Positive for gait problem.  Skin: Negative for color change, pallor,  rash and wound.  Neurological: Negative for dizziness, seizures, syncope, light-headedness and headaches.  Hematological: Does not bruise/bleed easily.  Psychiatric/Behavioral: Positive for confusion. Negative for agitation and sleep disturbance. The patient is not nervous/anxious.        Easily irritable    Immunization History  Administered Date(s) Administered  . Influenza,inj,Quad PF,6+ Mos 12/19/2013  . Influenza-Unspecified 01/21/2013, 12/28/2014, 01/03/2016, 12/31/2016  . PPD Test 12/20/2013, 01/02/2014  . Pneumococcal Conjugate-13 07/25/2016  . Pneumococcal Polysaccharide-23 11/24/1999  . Td 12/28/1994  . Tdap 04/08/2013   Pertinent  Health Maintenance Due  Topic Date Due  . INFLUENZA VACCINE  Completed  . DEXA SCAN  Completed  . PNA vac Low Risk Adult  Completed   Fall Risk  10/26/2016 12/03/2014 06/08/2014  Falls in the past year? Yes Yes No  Number falls in past yr: 2 or more 1 -  Injury with Fall? No No -  Risk for fall due to : - History of fall(s);Impaired balance/gait;Impaired mobility Impaired balance/gait  Follow up - Falls evaluation completed -   Functional Status Survey:    Vitals:   02/04/17 1025  BP: (!) 143/63  Pulse: 73  Resp: 10  Temp: (!) 97.5 F (36.4 C)  SpO2: 91%  Weight: 123 lb (55.8 kg)  Height: 5\' 2"  (1.575 m)   Body mass index is 22.5 kg/m. Physical Exam  Constitutional:  Frail eldely in no acute distress  HENT:  Head: Normocephalic.  Mouth/Throat: Oropharynx is clear and moist. No oropharyngeal exudate.  Eyes: Conjunctivae and EOM are normal. Pupils are equal, round, and reactive to light. Right eye exhibits no discharge. Left eye exhibits no discharge. No scleral icterus.  Neck: Normal range of motion. No JVD present. No thyromegaly present.  Cardiovascular: Normal rate, regular rhythm, normal heart sounds and intact distal pulses. Exam reveals no gallop and no friction rub.  No murmur heard. Pulmonary/Chest: Effort normal. No  respiratory distress. She has no wheezes. She has no rales.  diminished breath sound to bases. Oxygen 2 liters via Nasal cannula.   Abdominal: Soft. Bowel sounds are normal. She exhibits no distension. There is no tenderness. There is no rebound and no guarding.  Musculoskeletal: She exhibits no edema or tenderness.  Moves x 4 extremities. Unsteady gait uses rolator.  Lymphadenopathy:    She has no cervical adenopathy.  Neurological: Coordination normal.  Alert and oriented to person and place.   Skin: Skin is warm and dry. No  rash noted. No erythema.  Psychiatric: She has a normal mood and affect.   Labs reviewed: Recent Labs    11/02/16 11/26/16 01/11/17  NA 140 139 142  K 4.7 4.5 4.7  BUN 32* 31* 28*  CREATININE 1.0 0.8 0.9   Recent Labs    05/07/16 11/26/16 01/11/17  AST 18 16 23   ALT 11 12 23   ALKPHOS 81 85 81   Recent Labs    11/06/16 1238 11/26/16 01/11/17  WBC 9.5 7.2 9.5  NEUTROABS 7.7*  --   --   HGB 18.2* 18.0* 17.1*  HCT 55.1* 53* 50*  MCV 102.3*  --   --   PLT 363 333 264   Lab Results  Component Value Date   TSH 1.91 11/05/2016   Lab Results  Component Value Date   HGBA1C 5.8 10/01/2016   Lab Results  Component Value Date   CHOL 148 10/01/2016   HDL 69 10/01/2016   LDLCALC 64 10/01/2016   TRIG 73 10/01/2016    Significant Diagnostic Results in last 30 days:  No results found.  Assessment/Plan 1. Physical deconditioning Progressive decline possible due to her poor oral intake. Continue to follow up with hospice service.   2. Chronic obstructive pulmonary disease Stable. Diminished lung basses recently treated for pneumonia.continue on Duonebs treatment and continuous oxygen via nasal cannula.    3. Hypothyroidism Lab Results  Component Value Date   TSH 1.91 11/05/2016   Continue on levothyroxine 50 mcg tablet. Monitor TSH level.   Family/ staff Communication: Reviewed plan of care with patient and facility Nurse supervisor    Labs/tests ordered: None   Sandrea Hughs, NP

## 2017-02-05 DIAGNOSIS — I509 Heart failure, unspecified: Secondary | ICD-10-CM | POA: Diagnosis not present

## 2017-02-05 DIAGNOSIS — J449 Chronic obstructive pulmonary disease, unspecified: Secondary | ICD-10-CM | POA: Diagnosis not present

## 2017-02-05 DIAGNOSIS — R0902 Hypoxemia: Secondary | ICD-10-CM | POA: Diagnosis not present

## 2017-02-05 DIAGNOSIS — I679 Cerebrovascular disease, unspecified: Secondary | ICD-10-CM | POA: Diagnosis not present

## 2017-02-05 DIAGNOSIS — I69954 Hemiplegia and hemiparesis following unspecified cerebrovascular disease affecting left non-dominant side: Secondary | ICD-10-CM | POA: Diagnosis not present

## 2017-02-05 DIAGNOSIS — I10 Essential (primary) hypertension: Secondary | ICD-10-CM | POA: Diagnosis not present

## 2017-02-08 DIAGNOSIS — I679 Cerebrovascular disease, unspecified: Secondary | ICD-10-CM | POA: Diagnosis not present

## 2017-02-08 DIAGNOSIS — I69954 Hemiplegia and hemiparesis following unspecified cerebrovascular disease affecting left non-dominant side: Secondary | ICD-10-CM | POA: Diagnosis not present

## 2017-02-08 DIAGNOSIS — J449 Chronic obstructive pulmonary disease, unspecified: Secondary | ICD-10-CM | POA: Diagnosis not present

## 2017-02-08 DIAGNOSIS — I10 Essential (primary) hypertension: Secondary | ICD-10-CM | POA: Diagnosis not present

## 2017-02-08 DIAGNOSIS — I509 Heart failure, unspecified: Secondary | ICD-10-CM | POA: Diagnosis not present

## 2017-02-08 DIAGNOSIS — R0902 Hypoxemia: Secondary | ICD-10-CM | POA: Diagnosis not present

## 2017-02-10 DIAGNOSIS — R0902 Hypoxemia: Secondary | ICD-10-CM | POA: Diagnosis not present

## 2017-02-10 DIAGNOSIS — I509 Heart failure, unspecified: Secondary | ICD-10-CM | POA: Diagnosis not present

## 2017-02-10 DIAGNOSIS — J449 Chronic obstructive pulmonary disease, unspecified: Secondary | ICD-10-CM | POA: Diagnosis not present

## 2017-02-10 DIAGNOSIS — I10 Essential (primary) hypertension: Secondary | ICD-10-CM | POA: Diagnosis not present

## 2017-02-10 DIAGNOSIS — I69954 Hemiplegia and hemiparesis following unspecified cerebrovascular disease affecting left non-dominant side: Secondary | ICD-10-CM | POA: Diagnosis not present

## 2017-02-10 DIAGNOSIS — I679 Cerebrovascular disease, unspecified: Secondary | ICD-10-CM | POA: Diagnosis not present

## 2017-02-17 DIAGNOSIS — R0902 Hypoxemia: Secondary | ICD-10-CM | POA: Diagnosis not present

## 2017-02-17 DIAGNOSIS — I679 Cerebrovascular disease, unspecified: Secondary | ICD-10-CM | POA: Diagnosis not present

## 2017-02-17 DIAGNOSIS — J449 Chronic obstructive pulmonary disease, unspecified: Secondary | ICD-10-CM | POA: Diagnosis not present

## 2017-02-17 DIAGNOSIS — I509 Heart failure, unspecified: Secondary | ICD-10-CM | POA: Diagnosis not present

## 2017-02-17 DIAGNOSIS — I69954 Hemiplegia and hemiparesis following unspecified cerebrovascular disease affecting left non-dominant side: Secondary | ICD-10-CM | POA: Diagnosis not present

## 2017-02-17 DIAGNOSIS — I10 Essential (primary) hypertension: Secondary | ICD-10-CM | POA: Diagnosis not present

## 2017-02-20 DIAGNOSIS — H11149 Conjunctival xerosis, unspecified, unspecified eye: Secondary | ICD-10-CM | POA: Diagnosis not present

## 2017-02-20 DIAGNOSIS — R0902 Hypoxemia: Secondary | ICD-10-CM | POA: Diagnosis not present

## 2017-02-20 DIAGNOSIS — I509 Heart failure, unspecified: Secondary | ICD-10-CM | POA: Diagnosis not present

## 2017-02-20 DIAGNOSIS — F339 Major depressive disorder, recurrent, unspecified: Secondary | ICD-10-CM | POA: Diagnosis not present

## 2017-02-20 DIAGNOSIS — K219 Gastro-esophageal reflux disease without esophagitis: Secondary | ICD-10-CM | POA: Diagnosis not present

## 2017-02-20 DIAGNOSIS — I679 Cerebrovascular disease, unspecified: Secondary | ICD-10-CM | POA: Diagnosis not present

## 2017-02-20 DIAGNOSIS — I69954 Hemiplegia and hemiparesis following unspecified cerebrovascular disease affecting left non-dominant side: Secondary | ICD-10-CM | POA: Diagnosis not present

## 2017-02-20 DIAGNOSIS — I10 Essential (primary) hypertension: Secondary | ICD-10-CM | POA: Diagnosis not present

## 2017-02-20 DIAGNOSIS — R451 Restlessness and agitation: Secondary | ICD-10-CM | POA: Diagnosis not present

## 2017-02-20 DIAGNOSIS — E039 Hypothyroidism, unspecified: Secondary | ICD-10-CM | POA: Diagnosis not present

## 2017-02-20 DIAGNOSIS — J449 Chronic obstructive pulmonary disease, unspecified: Secondary | ICD-10-CM | POA: Diagnosis not present

## 2017-02-20 DIAGNOSIS — R54 Age-related physical debility: Secondary | ICD-10-CM | POA: Diagnosis not present

## 2017-02-23 DIAGNOSIS — J449 Chronic obstructive pulmonary disease, unspecified: Secondary | ICD-10-CM | POA: Diagnosis not present

## 2017-02-23 DIAGNOSIS — I69954 Hemiplegia and hemiparesis following unspecified cerebrovascular disease affecting left non-dominant side: Secondary | ICD-10-CM | POA: Diagnosis not present

## 2017-02-23 DIAGNOSIS — I679 Cerebrovascular disease, unspecified: Secondary | ICD-10-CM | POA: Diagnosis not present

## 2017-02-23 DIAGNOSIS — I509 Heart failure, unspecified: Secondary | ICD-10-CM | POA: Diagnosis not present

## 2017-02-23 DIAGNOSIS — R0902 Hypoxemia: Secondary | ICD-10-CM | POA: Diagnosis not present

## 2017-02-23 DIAGNOSIS — I10 Essential (primary) hypertension: Secondary | ICD-10-CM | POA: Diagnosis not present

## 2017-03-02 DIAGNOSIS — I679 Cerebrovascular disease, unspecified: Secondary | ICD-10-CM | POA: Diagnosis not present

## 2017-03-02 DIAGNOSIS — R0902 Hypoxemia: Secondary | ICD-10-CM | POA: Diagnosis not present

## 2017-03-02 DIAGNOSIS — I509 Heart failure, unspecified: Secondary | ICD-10-CM | POA: Diagnosis not present

## 2017-03-02 DIAGNOSIS — J449 Chronic obstructive pulmonary disease, unspecified: Secondary | ICD-10-CM | POA: Diagnosis not present

## 2017-03-02 DIAGNOSIS — I10 Essential (primary) hypertension: Secondary | ICD-10-CM | POA: Diagnosis not present

## 2017-03-02 DIAGNOSIS — I69954 Hemiplegia and hemiparesis following unspecified cerebrovascular disease affecting left non-dominant side: Secondary | ICD-10-CM | POA: Diagnosis not present

## 2017-03-04 ENCOUNTER — Non-Acute Institutional Stay (SKILLED_NURSING_FACILITY): Payer: Medicare Other | Admitting: Family

## 2017-03-04 ENCOUNTER — Encounter: Payer: Self-pay | Admitting: Family

## 2017-03-04 DIAGNOSIS — M545 Low back pain: Secondary | ICD-10-CM | POA: Diagnosis not present

## 2017-03-04 DIAGNOSIS — G8929 Other chronic pain: Secondary | ICD-10-CM | POA: Diagnosis not present

## 2017-03-04 DIAGNOSIS — I5022 Chronic systolic (congestive) heart failure: Secondary | ICD-10-CM

## 2017-03-04 NOTE — Progress Notes (Signed)
Location:  [facility] Room Number: 10 Place of Service:  SNF (31) Provider: Madeline Bebout FNP-C  Blanchie Serve, MD  Patient Care Team: Blanchie Serve, MD as PCP - General (Internal Medicine) Gaynelle Arabian, MD as Consulting Physician (Orthopedic Surgery) [name], [place] (Skilled Nursing and [place]) Guynell Kleiber, Nelda Bucks, NP as Nurse Practitioner (Family Medicine)  Extended Emergency Contact Information Primary Emergency Contact: Johnston,Betsy Address: [place], [place] Phone: [phone] Relation: Daughter Secondary Emergency Contact: Rey,Lindsey  [place] Phone: [phone] Relation: Daughter  Code Status:  DNR Goals of care: Advanced Directive information Advanced Directives 03/04/2017  Does Patient Have a Medical Advance Directive? Yes  Type of Paramedic of [place];Out of facility DNR (pink MOST or yellow form);Living will  Does patient want to make changes to medical advance directive? -  Copy of [place] in Chart? Yes  Pre-existing out of facility DNR order (yellow form or pink MOST form) Yellow form placed in chart (order not valid for inpatient use);Pink MOST form placed in chart (order not valid for inpatient use)     Chief Complaint  Patient presents with  . Acute Visit    medication management    HPI:  Pt is a 83 y.o. female seen today at [facility] for an acute visit for medication management.She is seen in her room today per Hospice nurse request.Nurse reports patient's POA would like medication evaluated for lower back pain. Facility Nurse states patient has been refusing to take medication at times.She has had generalized weakness requires assistance to transfer to commode.Her appetite continues to decline.She is currently on comfort care and continues to follow up with Hospice service. POA  request Roxanol to be given for pain and shortness of breath.No fever or chills reported.    Past Medical History:  Diagnosis Date  . Back pain, thoracic 01/12/2014  . Cancer ([place]) 1980   Breast cancer  . Constipation 11/18/2012  . COPD (chronic obstructive pulmonary disease) ([place])   . CVA (cerebral infarction) 06/08/2011   Following abdominal surgery for bowel obstruction   . Depression 12/26/2013  . DVT (deep venous thrombosis) ([place]) 11/18/2012  . Fall 02/07/2013  . GERD (gastroesophageal reflux disease) 03/30/2014  . Granuloma annulare 06/08/2014  . HTN (hypertension) 11/18/2012   01/15/14 Bun/creat 14/0.60   . Hypertension   . Hypothyroidism   . Incisional hernia, without obstruction or gangrene 05/27/2014   Lower abd from previous surgery-2-3 small incisional hernias which are reducible-no incarceration or pain complained.    . Insomnia 11/18/2012  . Left hemiparesis ([place]) 06/08/2014  . Osteoporosis   . Shortness of breath   . Thoracic spine fracture [facility]) 12/18/2013   01/05/14 T 10 Dr. Newman Pies: PCP managing pain.  F/u 2 months(suggested that if the pain is "not too bad" and seems to be improving that she "live with it" and give a change to heal without intervention.  12/18/13 X-ray Thoracic spine: Compression deformity at the level of T10. Comparing back to a chest x-ray from 12/09/2013, this appears new in the interval.       . TIA (transient ischemic attack)   . Ventral hernia 06/08/2014   Past Surgical History:  Procedure Laterality Date  . ABDOMINAL HYSTERECTOMY    . CATARACT EXTRACTION, BILATERAL    . EXPLORATORY LAPAROTOMY W/ BOWEL RESECTION  10/19/12-11/01/12 [facility] for exploratory laparotomty with extensive lysis of adhesion and segmental small bowel resection with end to end anastomosis per Dr. Isidore Moos 10/22/12  . MASTECTOMY  1980   left side  . TONSILLECTOMY    . TOTAL KNEE ARTHROPLASTY  2010   left knee    Allergies  Allergen Reactions  . Cortisone      Outpatient Encounter Medications as of 03/04/2017  Medication Sig  . acetaminophen (TYLENOL) 500 MG tablet Take 500 mg by mouth daily. At 2 Pm and 1000 mg tablet twice daily at 8Am and 9 PM  . albuterol (VENTOLIN HFA) 108 (90 Base) MCG/ACT inhaler Inhale 2 puffs into the lungs daily. Take 2 puffs Every 6 hours as needed for shortness of breath and wheezing  . antiseptic oral rinse (BIOTENE) LIQD 15 mLs by Mouth Rinse route daily as needed for dry mouth.   Marland Kitchen aspirin 81 MG chewable tablet Chew 81 mg by mouth every morning.  . bisacodyl (DULCOLAX) 10 MG suppository Place 10 mg rectally every three (3) days as needed for moderate constipation.  . carvedilol (COREG) 25 MG tablet Take 25 mg by mouth 2 (two) times daily with a meal. Hold if SBP <110 and HR <60/min  . cloNIDine (CATAPRES) 0.1 MG tablet Take 0.1 mg by mouth every morning.   Marland Kitchen Dextran 70-Hypromellose (ARTIFICIAL TEARS) 0.1-0.3 % SOLN Apply 1 drop to eye daily. Both eyes daily and up to four times daily as needed for burning.  Marland Kitchen Dextromethorphan-Guaifenesin (TUSSIN DM) 10-100 MG/5ML liquid Take by mouth. Give 10 ml every 6 hours as needed for cough  . divalproex (DEPAKOTE SPRINKLE) 125 MG capsule Take 125 mg by mouth as directed. Take one capsule at 10AM and 2 capsules at 4PM  . DULoxetine (CYMBALTA) 20 MG capsule Take 40 mg by mouth daily.   . furosemide (LASIX) 40 MG tablet Take 40 mg 2 (two) times daily by mouth.   . Glycerin, Laxative, (PEDIA-LAX) 2.8 g SUPP Place rectally daily as needed (for constipation).   . hydrALAZINE (APRESOLINE) 50 MG tablet Take 50 mg by mouth 2 (two) times daily. Hold for SBP < 110  . ipratropium-albuterol (DUONEB) 0.5-2.5 (3) MG/3ML SOLN Take 3 mLs every 6 (six) hours by nebulization. For shortness of breath and wheezing.   Marland Kitchen levothyroxine (SYNTHROID, LEVOTHROID) 50 MCG tablet Take 50 mcg by mouth every morning.   Marland Kitchen LORazepam (ATIVAN) 0.5 MG tablet Take 0.25 mg by mouth every 6 (six) hours as needed for  anxiety.  Marland Kitchen losartan (COZAAR) 100 MG tablet Take 100 mg by mouth daily.  . Melatonin 3 MG TBDP Take 3 mg by mouth at bedtime.  . Menthol, Topical Analgesic, (BIOFREEZE) 4 % GEL Apply 1 application topically 2 (two) times daily as needed.  . Multiple Vitamin (MULTIVITAMIN WITH MINERALS) TABS tablet Take 1 tablet by mouth every morning.  . Nutritional Supplements (RESOURCE 2.0) LIQD Take 120 mLs by mouth 2 (two) times daily.  Marland Kitchen omeprazole (PRILOSEC) 20 MG capsule Take 20 mg by mouth daily.   . OXYGEN Inhale 2 L/min as needed into the lungs.  . polyethylene glycol (MIRALAX / GLYCOLAX) packet Take 17 g by mouth at bedtime.   . predniSONE (DELTASONE) 5 MG tablet Take 5 mg by mouth daily with breakfast.  . tiotropium (SPIRIVA) 18 MCG inhalation capsule Place 18 mcg into inhaler and inhale daily. Inhale by taking 2 separate inhalations   No facility-administered encounter medications on file as of 03/04/2017.  Review of Systems  Constitutional: Positive for appetite change and fatigue. Negative for chills and fever.  HENT: Positive for hearing loss. Negative for congestion, rhinorrhea, sinus pressure, sinus pain, sneezing and sore throat.   Respiratory: Positive for cough. Negative for chest tightness.        Occasional shortness of breath with exercision  Cardiovascular: Negative for chest pain, palpitations and leg swelling.  Gastrointestinal: Negative for abdominal distention, abdominal pain, constipation, diarrhea, nausea and vomiting.  Genitourinary: Negative for urgency.       Indwelling Foley catheter   Musculoskeletal: Positive for back pain and gait problem.  Skin: Negative for color change, pallor, rash and wound.  Neurological: Negative for dizziness, light-headedness and headaches.       Generalized weakness   Hematological: Does not bruise/bleed easily.  Psychiatric/Behavioral: Negative for agitation, confusion and sleep disturbance. The patient is not nervous/anxious.      Immunization History  Administered Date(s) Administered  . Influenza,inj,Quad PF,6+ Mos 12/19/2013  . Influenza-Unspecified 01/21/2013, 12/28/2014, 01/03/2016, 12/31/2016  . PPD Test 12/20/2013, 01/02/2014  . Pneumococcal Conjugate-13 07/25/2016  . Pneumococcal Polysaccharide-23 11/24/1999  . Td 12/28/1994  . Tdap 04/08/2013   Pertinent  Health Maintenance Due  Topic Date Due  . INFLUENZA VACCINE  Completed  . DEXA SCAN  Completed  . PNA vac Low Risk Adult  Completed   Fall Risk  10/26/2016 12/03/2014 06/08/2014  Falls in the past year? Yes Yes No  Number falls in past yr: 2 or more 1 -  Injury with Fall? No No -  Risk for fall due to : - History of fall(s);Impaired balance/gait;Impaired mobility Impaired balance/gait  Follow up - Falls evaluation completed -      Vitals:   03/04/17 1232  BP: (!) 160/80  Pulse: 84  Resp: 18  Temp: (!) 97.3 F (36.3 C)  SpO2: 96%  Weight: 123 lb (55.8 kg)  Height: 5\' 2"  (1.575 m)   Body mass index is 22.5 kg/m. Physical Exam  Constitutional:  Frail elderly in no acute distress   HENT:  Head: Normocephalic.  Mouth/Throat: Oropharynx is clear and moist. No oropharyngeal exudate.  Eyes: Conjunctivae and EOM are normal. Pupils are equal, round, and reactive to light. Right eye exhibits no discharge. Left eye exhibits no discharge. No scleral icterus.  Neck: Normal range of motion. No JVD present. No thyromegaly present.  Cardiovascular: Normal rate, regular rhythm and intact distal pulses. Exam reveals no gallop and no friction rub.  No murmur heard. Pulmonary/Chest: Effort normal and breath sounds normal. No respiratory distress. She has no wheezes. She has no rales.  Abdominal: Soft. Bowel sounds are normal. She exhibits no distension. There is no tenderness. There is no rebound and no guarding.  Genitourinary:  Genitourinary Comments: Indwelling foley catheter draining dark amber colored urine.   Musculoskeletal: She exhibits no  edema or tenderness.  Unsteady gait.Generalized muscle weakness   Lymphadenopathy:    She has no cervical adenopathy.  Neurological: Coordination normal.  Alert and oriented to person and place but not time.   Skin: Skin is warm and dry. No rash noted.  Sacral area and Bilateral heels dark red nonblanchable color non-tender to touch.  Psychiatric: She has a normal mood and affect.   Labs reviewed: Recent Labs    11/02/16 11/26/16 01/11/17  NA 140 139 142  K 4.7 4.5 4.7  BUN 32* 31* 28*  CREATININE 1.0 0.8 0.9   Recent Labs    05/07/16 11/26/16 01/11/17  AST 18  16 23  ALT 11 12 23   ALKPHOS 81 85 81   Recent Labs    11/06/16 1238 11/26/16 01/11/17  WBC 9.5 7.2 9.5  NEUTROABS 7.7*  --   --   HGB 18.2* 18.0* 17.1*  HCT 55.1* 53* 50*  MCV 102.3*  --   --   PLT 363 333 264   Lab Results  Component Value Date   TSH 1.91 11/05/2016   Lab Results  Component Value Date   HGBA1C 5.8 10/01/2016   Lab Results  Component Value Date   CHOL 148 10/01/2016   HDL 69 10/01/2016   LDLCALC 64 10/01/2016   TRIG 73 10/01/2016    Significant Diagnostic Results in last 30 days:  No results found.  Assessment/Plan 1. Chronic left-sided low back pain without sciatica Refuses to swallow oral medication at times. Start on Roxanol 20 mg/ml take 0.25 ml (5 mg) by mouth every 4 hours as needed for pain.Continue comfort care.    2. Chronic systolic congestive heart failure  Shortness of breath with exertion.Lungs CTA and no edema to lower extremities.Has had poor oral intake. Decrease Furosemide to 20 mg tablet twice daily. Add Roxanol 20 mg/ml take 0.25 ml (5 mg) by mouth every 4 hours as needed for pain and shortness of breath.Continue comfort care.  Family/ staff Communication: Reviewed plan of care with patient and facility Nurse.  Labs/tests ordered: None   Kimberla Driskill C Leeta Grimme, NP

## 2017-03-05 DIAGNOSIS — I679 Cerebrovascular disease, unspecified: Secondary | ICD-10-CM | POA: Diagnosis not present

## 2017-03-05 DIAGNOSIS — R0902 Hypoxemia: Secondary | ICD-10-CM | POA: Diagnosis not present

## 2017-03-05 DIAGNOSIS — I10 Essential (primary) hypertension: Secondary | ICD-10-CM | POA: Diagnosis not present

## 2017-03-05 DIAGNOSIS — I69954 Hemiplegia and hemiparesis following unspecified cerebrovascular disease affecting left non-dominant side: Secondary | ICD-10-CM | POA: Diagnosis not present

## 2017-03-05 DIAGNOSIS — J449 Chronic obstructive pulmonary disease, unspecified: Secondary | ICD-10-CM | POA: Diagnosis not present

## 2017-03-05 DIAGNOSIS — I509 Heart failure, unspecified: Secondary | ICD-10-CM | POA: Diagnosis not present

## 2017-03-08 ENCOUNTER — Non-Acute Institutional Stay (SKILLED_NURSING_FACILITY): Payer: Medicare Other | Admitting: Family

## 2017-03-08 ENCOUNTER — Encounter: Payer: Self-pay | Admitting: Family

## 2017-03-08 DIAGNOSIS — I69954 Hemiplegia and hemiparesis following unspecified cerebrovascular disease affecting left non-dominant side: Secondary | ICD-10-CM | POA: Diagnosis not present

## 2017-03-08 DIAGNOSIS — M545 Low back pain: Secondary | ICD-10-CM

## 2017-03-08 DIAGNOSIS — G8929 Other chronic pain: Secondary | ICD-10-CM | POA: Diagnosis not present

## 2017-03-08 DIAGNOSIS — I10 Essential (primary) hypertension: Secondary | ICD-10-CM | POA: Diagnosis not present

## 2017-03-08 DIAGNOSIS — I509 Heart failure, unspecified: Secondary | ICD-10-CM | POA: Diagnosis not present

## 2017-03-08 DIAGNOSIS — R451 Restlessness and agitation: Secondary | ICD-10-CM | POA: Diagnosis not present

## 2017-03-08 DIAGNOSIS — J449 Chronic obstructive pulmonary disease, unspecified: Secondary | ICD-10-CM | POA: Diagnosis not present

## 2017-03-08 DIAGNOSIS — R0902 Hypoxemia: Secondary | ICD-10-CM | POA: Diagnosis not present

## 2017-03-08 DIAGNOSIS — I679 Cerebrovascular disease, unspecified: Secondary | ICD-10-CM | POA: Diagnosis not present

## 2017-03-08 NOTE — Progress Notes (Addendum)
Location:  [place] Room Number: 10 Place of Service:  SNF (31) Provider: Jade Burkard FNP-C  Blanchie Serve, MD  Patient Care Team: Blanchie Serve, MD as PCP - General (Internal Medicine) Gaynelle Arabian, MD as Consulting Physician (Orthopedic Surgery) [name], [place] (Skilled Nursing and [place]) Lakshya Mcgillicuddy, Nelda Bucks, NP as Nurse Practitioner (Family Medicine)  Extended Emergency Contact Information Primary Emergency Contact: Johnston,Betsy Address: [place], [place] Phone: [phone] Relation: Daughter Secondary Emergency Contact: Rey,Lindsey  [place] Phone: [phone] Relation: Daughter  Code Status:  DNR Goals of care: Advanced Directive information Advanced Directives 03/08/2017  Does Patient Have a Medical Advance Directive? Yes  Type of Paramedic of [place];Out of facility DNR (pink MOST or yellow form);Living will  Does patient want to make changes to medical advance directive? No - Patient declined  Copy of [place] in Chart? Yes  Pre-existing out of facility DNR order (yellow form or pink MOST form) Yellow form placed in chart (order not valid for inpatient use);Pink MOST form placed in chart (order not valid for inpatient use)     Chief Complaint  Patient presents with  . Acute Visit    Increased behavior concerns and agitation     HPI:  Pt is a 83 y.o. female seen today at [facility] for an acute visit for evaluation of increased agitation and low back pain. She is seen in her room today with facility Nurse present at bedside.Facility Nurse reports patient has had increased agitation, physically and verbally aggressive. Nurse reports patient was in the dinning room being assisted and she threw orange juice to the Nurse and a cup of water to another resident. She also continues to grimace and  complain of low back pain. Patient's daughter was notified of incident by Nurse and has requested current ativan dose and Roxanol to be adjusted.Patient resting quietly in bed during visit.she does moan and grimace with repositioning in bed. She continues to follow up with Hospice service.     Past Medical History:  Diagnosis Date  . Back pain, thoracic 01/12/2014  . Cancer ([place]) 1980   Breast cancer  . Constipation 11/18/2012  . COPD (chronic obstructive pulmonary disease) ([place])   . CVA (cerebral infarction) 06/08/2011   Following abdominal surgery for bowel obstruction   . Depression 12/26/2013  . DVT (deep venous thrombosis) ([place]) 11/18/2012  . Fall 02/07/2013  . GERD (gastroesophageal reflux disease) 03/30/2014  . Granuloma annulare 06/08/2014  . HTN (hypertension) 11/18/2012   01/15/14 Bun/creat 14/0.60   . Hypertension   . Hypothyroidism   . Incisional hernia, without obstruction or gangrene 05/27/2014   Lower abd from previous surgery-2-3 small incisional hernias which are reducible-no incarceration or pain complained.    . Insomnia 11/18/2012  . Left hemiparesis ([place]) 06/08/2014  . Osteoporosis   . Shortness of breath   . Thoracic spine fracture [facility]) 12/18/2013   01/05/14 T 10 Dr. Newman Pies: PCP managing pain.  F/u 2 months(suggested that if the pain is "not too bad" and seems to be improving that she "live with it" and give a change to heal without intervention.  12/18/13 X-ray Thoracic spine: Compression deformity at the level of T10. Comparing back to a chest x-ray from 12/09/2013, this appears new in the interval.       . TIA (transient  ischemic attack)   . Ventral hernia 06/08/2014   Past Surgical History:  Procedure Laterality Date  . ABDOMINAL HYSTERECTOMY    . CATARACT EXTRACTION, BILATERAL    . EXPLORATORY LAPAROTOMY W/ BOWEL RESECTION     10/19/12-11/01/12 [facility] for exploratory laparotomty with extensive lysis of adhesion and segmental small bowel resection with  end to end anastomosis per Dr. Isidore Moos 10/22/12  . MASTECTOMY  1980   left side  . TONSILLECTOMY    . TOTAL KNEE ARTHROPLASTY  2010   left knee    Allergies  Allergen Reactions  . Cortisone     Outpatient Encounter Medications as of 03/08/2017  Medication Sig  . acetaminophen (TYLENOL) 500 MG tablet Take 500 mg by mouth daily. At 2 Pm and 1000 mg tablet twice daily at 8Am and 9 PM  . albuterol (VENTOLIN HFA) 108 (90 Base) MCG/ACT inhaler Inhale 2 puffs into the lungs daily. Take 2 puffs Every 6 hours as needed for shortness of breath and wheezing  . antiseptic oral rinse (BIOTENE) LIQD 15 mLs by Mouth Rinse route daily as needed for dry mouth.   Marland Kitchen aspirin 81 MG chewable tablet Chew 81 mg by mouth every morning.  . bisacodyl (DULCOLAX) 10 MG suppository Place 10 mg rectally every three (3) days as needed for moderate constipation.  . carvedilol (COREG) 25 MG tablet Take 25 mg by mouth 2 (two) times daily with a meal. Hold if SBP <110 and HR <60/min  . cloNIDine (CATAPRES) 0.1 MG tablet Take 0.1 mg by mouth every morning.   Marland Kitchen Dextran 70-Hypromellose (ARTIFICIAL TEARS) 0.1-0.3 % SOLN Apply 1 drop to eye daily. Both eyes daily and up to four times daily as needed for burning.  Marland Kitchen Dextromethorphan-Guaifenesin (TUSSIN DM) 10-100 MG/5ML liquid Take by mouth. Give 10 ml every 6 hours as needed for cough  . divalproex (DEPAKOTE SPRINKLE) 125 MG capsule Take 125 mg by mouth as directed. Take one capsule at 10AM and 2 capsules at 4PM  . DULoxetine (CYMBALTA) 20 MG capsule Take 40 mg by mouth daily.   . furosemide (LASIX) 40 MG tablet Take 20 mg by mouth 2 (two) times daily.   . Glycerin, Laxative, (PEDIA-LAX) 2.8 g SUPP Place rectally daily as needed (for constipation).   . hydrALAZINE (APRESOLINE) 50 MG tablet Take 50 mg by mouth 2 (two) times daily. Hold for SBP < 110  . ipratropium-albuterol (DUONEB) 0.5-2.5 (3) MG/3ML SOLN Take 3 mLs every 6 (six) hours by nebulization. For shortness of breath and  wheezing.   Marland Kitchen ipratropium-albuterol (DUONEB) 0.5-2.5 (3) MG/3ML SOLN Take 3 mLs by nebulization every 4 (four) hours as needed.  Marland Kitchen levothyroxine (SYNTHROID, LEVOTHROID) 50 MCG tablet Take 50 mcg by mouth every morning.   Marland Kitchen LORazepam (ATIVAN) 0.5 MG tablet Take 0.25 mg by mouth every 6 (six) hours as needed for anxiety.  Marland Kitchen losartan (COZAAR) 100 MG tablet Take 100 mg by mouth daily.  . Melatonin 3 MG TBDP Take 3 mg by mouth at bedtime.  . Menthol, Topical Analgesic, (BIOFREEZE) 4 % GEL Apply 1 application topically 2 (two) times daily as needed.  . Morphine Sulfate (MORPHINE CONCENTRATE) 10 mg / 0.5 ml concentrated solution Take 5 mg by mouth every 4 (four) hours as needed for severe pain.  . Multiple Vitamin (MULTIVITAMIN WITH MINERALS) TABS tablet Take 1 tablet by mouth every morning.  . Nutritional Supplements (RESOURCE 2.0) LIQD Take 120 mLs by mouth 2 (two) times daily.  Marland Kitchen omeprazole (PRILOSEC) 20 MG  capsule Take 20 mg by mouth daily.   . OXYGEN Inhale 2 L/min as needed into the lungs.  . polyethylene glycol (MIRALAX / GLYCOLAX) packet Take 17 g by mouth at bedtime.   . predniSONE (DELTASONE) 5 MG tablet Take 5 mg by mouth daily with breakfast.  . tiotropium (SPIRIVA) 18 MCG inhalation capsule Place 18 mcg into inhaler and inhale daily. Inhale by taking 2 separate inhalations   No facility-administered encounter medications on file as of 03/08/2017.     Review of Systems  Constitutional: Negative for activity change, appetite change, chills, fatigue and fever.  HENT: Negative for congestion, rhinorrhea, sinus pressure, sinus pain, sneezing and sore throat.   Eyes: Negative for redness and itching.  Respiratory: Negative for cough, chest tightness, shortness of breath and wheezing.   Cardiovascular: Negative for chest pain, palpitations and leg swelling.  Gastrointestinal: Negative for abdominal distention, abdominal pain, constipation, diarrhea, nausea and vomiting.  Genitourinary:  Negative for frequency and urgency.       Has indwelling foley catheter.   Musculoskeletal: Positive for back pain and gait problem.  Skin: Negative for color change, pallor and rash.  Neurological: Negative for dizziness, numbness and headaches.  Psychiatric/Behavioral: Positive for agitation. Negative for sleep disturbance. The patient is not nervous/anxious.     Immunization History  Administered Date(s) Administered  . Influenza,inj,Quad PF,6+ Mos 12/19/2013  . Influenza-Unspecified 01/21/2013, 12/28/2014, 01/03/2016, 12/31/2016  . PPD Test 12/20/2013, 01/02/2014  . Pneumococcal Conjugate-13 07/25/2016  . Pneumococcal Polysaccharide-23 11/24/1999  . Td 12/28/1994  . Tdap 04/08/2013   Pertinent  Health Maintenance Due  Topic Date Due  . INFLUENZA VACCINE  Completed  . DEXA SCAN  Completed  . PNA vac Low Risk Adult  Completed   Fall Risk  10/26/2016 12/03/2014 06/08/2014  Falls in the past year? Yes Yes No  Number falls in past yr: 2 or more 1 -  Injury with Fall? No No -  Risk for fall due to : - History of fall(s);Impaired balance/gait;Impaired mobility Impaired balance/gait  Follow up - Falls evaluation completed -    Vitals:   03/08/17 1215  BP: 130/72  Pulse: 65  Resp: 15  Temp: 97.9 F (36.6 C)  TempSrc: Oral  SpO2: 98%  Weight: 123 lb (55.8 kg)  Height: 5\' 2"  (1.575 m)   Body mass index is 22.5 kg/m. Physical Exam  Constitutional:  Frail Elderly in no acute distress   HENT:  Head: Normocephalic.  Mouth/Throat: Oropharynx is clear and moist. No oropharyngeal exudate.  Eyes: Conjunctivae and EOM are normal. Pupils are equal, round, and reactive to light. Right eye exhibits no discharge. Left eye exhibits no discharge. No scleral icterus.  Neck: Normal range of motion. No JVD present. No thyromegaly present.  Cardiovascular: Normal rate, regular rhythm, normal heart sounds and intact distal pulses. Exam reveals no gallop and no friction rub.  No murmur  heard. Pulmonary/Chest: Effort normal and breath sounds normal. No respiratory distress. She has no wheezes. She has no rales.  Abdominal: Soft. Bowel sounds are normal. She exhibits no distension. There is no tenderness. There is no rebound and no guarding.  Genitourinary:  Genitourinary Comments: Indwelling foley catheter draining dark amber urine   Musculoskeletal: She exhibits no edema or tenderness.  Unsteady gait  Lymphadenopathy:    She has no cervical adenopathy.  Neurological: Coordination normal.  Alert and oriented to person and place   Skin: Skin is warm and dry. No rash noted. No erythema. No pallor.  Blanchable skin redness to sacral area non-tender to touch.   Psychiatric: She has a normal mood and affect.   Labs reviewed: Recent Labs    11/02/16 11/26/16 01/11/17  NA 140 139 142  K 4.7 4.5 4.7  BUN 32* 31* 28*  CREATININE 1.0 0.8 0.9   Recent Labs    05/07/16 11/26/16 01/11/17  AST 18 16 23   ALT 11 12 23   ALKPHOS 81 85 81   Recent Labs    11/06/16 1238 11/26/16 01/11/17  WBC 9.5 7.2 9.5  NEUTROABS 7.7*  --   --   HGB 18.2* 18.0* 17.1*  HCT 55.1* 53* 50*  MCV 102.3*  --   --   PLT 363 333 264   Lab Results  Component Value Date   TSH 1.91 11/05/2016   Lab Results  Component Value Date   HGBA1C 5.8 10/01/2016   Lab Results  Component Value Date   CHOL 148 10/01/2016   HDL 69 10/01/2016   LDLCALC 64 10/01/2016   TRIG 73 10/01/2016    Significant Diagnostic Results in last 30 days:  No results found.  Assessment/Plan 1. Agitation Afebrile.verbally and physically abusive towards staff and threw cup of water to another resident.Patient calm during visit.increased Ativan to 0.5 mg tablet twice daily and 0.5 mg tablet daily as needed at 2 pm.   2. Chronic left-sided low back pain without sciatica Grimacing with repositioning in bed. Change Roxanol 20 mg /ml to 0.5 mls (10 mg) by mouth every 4 hours as needed for pain or shortness of  breath.continue to follow up with hospice service.   Addendum: Ammonia level slightly elevated 73 (01/14/2017) will reduce Depakote to 125 mg Tablet twice daily at 10 Am and 4 Pm per pharmacy recommendation. Recheck ammonia level and BMP with next lab day.   Family/ staff Communication: Reviewed plan of care with patient and facility Nurse.  Labs/tests ordered: ammonia level and BMP with next lab day.   Sandrea Hughs, NP

## 2017-03-09 DIAGNOSIS — I69954 Hemiplegia and hemiparesis following unspecified cerebrovascular disease affecting left non-dominant side: Secondary | ICD-10-CM | POA: Diagnosis not present

## 2017-03-09 DIAGNOSIS — I509 Heart failure, unspecified: Secondary | ICD-10-CM | POA: Diagnosis not present

## 2017-03-09 DIAGNOSIS — I679 Cerebrovascular disease, unspecified: Secondary | ICD-10-CM | POA: Diagnosis not present

## 2017-03-09 DIAGNOSIS — R0902 Hypoxemia: Secondary | ICD-10-CM | POA: Diagnosis not present

## 2017-03-09 DIAGNOSIS — I10 Essential (primary) hypertension: Secondary | ICD-10-CM | POA: Diagnosis not present

## 2017-03-09 DIAGNOSIS — J449 Chronic obstructive pulmonary disease, unspecified: Secondary | ICD-10-CM | POA: Diagnosis not present

## 2017-03-12 ENCOUNTER — Encounter: Payer: Self-pay | Admitting: Internal Medicine

## 2017-03-12 ENCOUNTER — Non-Acute Institutional Stay (SKILLED_NURSING_FACILITY): Payer: Medicare Other | Admitting: Internal Medicine

## 2017-03-12 DIAGNOSIS — J9611 Chronic respiratory failure with hypoxia: Secondary | ICD-10-CM | POA: Diagnosis not present

## 2017-03-12 DIAGNOSIS — J449 Chronic obstructive pulmonary disease, unspecified: Secondary | ICD-10-CM | POA: Diagnosis not present

## 2017-03-12 DIAGNOSIS — I110 Hypertensive heart disease with heart failure: Secondary | ICD-10-CM | POA: Diagnosis not present

## 2017-03-12 DIAGNOSIS — K219 Gastro-esophageal reflux disease without esophagitis: Secondary | ICD-10-CM | POA: Diagnosis not present

## 2017-03-12 DIAGNOSIS — E039 Hypothyroidism, unspecified: Secondary | ICD-10-CM

## 2017-03-12 DIAGNOSIS — R4189 Other symptoms and signs involving cognitive functions and awareness: Secondary | ICD-10-CM | POA: Diagnosis not present

## 2017-03-12 DIAGNOSIS — J9612 Chronic respiratory failure with hypercapnia: Secondary | ICD-10-CM | POA: Diagnosis not present

## 2017-03-12 DIAGNOSIS — I509 Heart failure, unspecified: Secondary | ICD-10-CM | POA: Diagnosis not present

## 2017-03-12 NOTE — Progress Notes (Signed)
[name]   Provider: Blanchie Serve MD   Location:  [place] Room Number: N-10 Place of Service:  SNF (31)  PCP: Blanchie Serve, MD Patient Care Team: Blanchie Serve, MD as PCP - General (Internal Medicine) Dana Arabian, MD as Consulting Physician (Orthopedic Surgery) [name], [place] (Skilled Nursing and [place]) [name], Nelda Bucks, NP as Nurse Practitioner (Family Medicine)  Extended Emergency Contact Information Primary Emergency Contact: Johnston,Betsy Address: [place], [place] Phone: [phone] Relation: Daughter Secondary Emergency Contact: Rey,Lindsey  [place] Phone: [phone] Relation: Daughter  Code Status: DNR  Goals of Care: Advanced Directive information Advanced Directives 03/12/2017  Does Patient Have a Medical Advance Directive? Yes  Type of Paramedic of [place];Living will;Out of facility DNR (pink MOST or yellow form)  Does patient want to make changes to medical advance directive? No - Patient declined  Copy of [place] in Chart? Yes  Pre-existing out of facility DNR order (yellow form or pink MOST form) Yellow form placed in chart (order not valid for inpatient use);Pink MOST form placed in chart (order not valid for inpatient use)      Chief Complaint  Patient presents with  . Medical Management of Chronic Issues    Routine Visit     HPI: Patient is a 83 y.o. female seen today for routine visit. She is sleepy and tired and does not participate in HPI and ROS today. She appears comfortable. She is on oxygen by nasal canula. Her ativan dosing was recently increased with her worsening anxiety and agitation. Her roxanol was increased as well to help with her pain and dyspnea. She is followed by hospice services with her chronic respiratory failure and failure to thrive.    Past Medical History:  Diagnosis Date  . Back pain, thoracic 01/12/2014  . Cancer ([place]) 1980   Breast cancer  . Constipation 11/18/2012  . COPD (chronic obstructive pulmonary disease) ([place])   . CVA (cerebral infarction) 06/08/2011   Following abdominal surgery for bowel obstruction   . Depression 12/26/2013  . DVT (deep venous thrombosis) ([place]) 11/18/2012  . Fall 02/07/2013  . GERD (gastroesophageal reflux disease) 03/30/2014  . Granuloma annulare 06/08/2014  . HTN (hypertension) 11/18/2012   01/15/14 Bun/creat 14/0.60   . Hypertension   . Hypothyroidism   . Incisional hernia, without obstruction or gangrene 05/27/2014   Lower abd from previous surgery-2-3 small incisional hernias which are reducible-no incarceration or pain complained.    . Insomnia 11/18/2012  . Left hemiparesis ([place]) 06/08/2014  . Osteoporosis   . Shortness of breath   . Thoracic spine fracture [facility]) 12/18/2013   01/05/14 T 10 Dr. Newman Pies: PCP managing pain.  F/u 2 months(suggested that if the pain is "not too bad" and seems to be improving that she "live with it" and give a change to heal without intervention.  12/18/13 X-ray Thoracic spine: Compression deformity at the level of T10. Comparing back to a chest x-ray from 12/09/2013, this appears new in the interval.       . TIA (transient ischemic attack)   . Ventral hernia 06/08/2014   Past Surgical History:  Procedure Laterality Date  . ABDOMINAL HYSTERECTOMY    . CATARACT EXTRACTION, BILATERAL    . EXPLORATORY LAPAROTOMY W/ BOWEL RESECTION     10/19/12-11/01/12 [facility] for  exploratory laparotomty with extensive lysis of adhesion and segmental small bowel resection with end to end anastomosis per Dr. Isidore Moos 10/22/12  . MASTECTOMY  1980   left side  . TONSILLECTOMY    . TOTAL KNEE ARTHROPLASTY  2010   left knee    reports that she quit smoking about 31 years ago. She has a 45.00 pack-year smoking history. she has never used smokeless tobacco. She reports  that she does not drink alcohol or use drugs. Social History   Socioeconomic History  . Marital status: Widowed    Spouse name: Not on file  . Number of children: Not on file  . Years of education: Not on file  . Highest education level: Not on file  Social Needs  . Financial resource strain: Not on file  . Food insecurity - worry: Not on file  . Food insecurity - inability: Not on file  . Transportation needs - medical: Not on file  . Transportation needs - non-medical: Not on file  Occupational History  . Not on file  Tobacco Use  . Smoking status: Former Smoker    Packs/day: 1.00    Years: 45.00    Pack years: 45.00    Last attempt to quit: 01/26/1986    Years since quitting: 31.1  . Smokeless tobacco: Never Used  Substance and Sexual Activity  . Alcohol use: No    Alcohol/week: 0.6 oz    Types: 1 Glasses of wine per week    Comment: previously  . Drug use: No  . Sexual activity: No  Other Topics Concern  . Not on file  Social History Narrative   [facility] [place], using [place] prior to fall, bedridden.  PT/OT before the fall.     Moved to SNF 12/20/13   Widowed   Former smoker -stopped 1987   Alcohol none    DNR, MOST, POA, Living Will    Functional Status Survey:    Family History  Problem Relation Age of Onset  . High blood pressure Mother   . Osteoporosis Mother   . Heart attack Father        d/o 52    Health Maintenance  Topic Date Due  . Samul Dada  04/09/2023  . INFLUENZA VACCINE  Completed  . DEXA SCAN  Completed  . PNA vac Low Risk Adult  Completed    Allergies  Allergen Reactions  . Cortisone     Outpatient Encounter Medications as of 03/12/2017  Medication Sig  . acetaminophen (TYLENOL) 500 MG tablet Take 500 mg by mouth daily. At 2 Pm and 1000 mg tablet twice daily at 8Am and 9 PM  . albuterol (VENTOLIN HFA) 108 (90 Base) MCG/ACT inhaler Inhale 2 puffs into the lungs daily. Take 2 puffs Every 6 hours as needed for shortness of  breath and wheezing  . antiseptic oral rinse (BIOTENE) LIQD 15 mLs by Mouth Rinse route daily as needed for dry mouth.   Marland Kitchen aspirin 81 MG chewable tablet Chew 81 mg by mouth every morning.  . bisacodyl (DULCOLAX) 10 MG suppository Place 10 mg rectally every three (3) days as needed for moderate constipation.  . carvedilol (COREG) 25 MG tablet Take 25 mg by mouth 2 (two) times daily with a meal. Hold if SBP <110 and HR <60/min  . cloNIDine (CATAPRES) 0.1 MG tablet Take 0.1 mg by mouth every morning.   Marland Kitchen Dextran 70-Hypromellose (ARTIFICIAL TEARS) 0.1-0.3 % SOLN Apply 1 drop to eye daily. Both eyes daily  and up to four times daily as needed for burning.  Marland Kitchen Dextromethorphan-Guaifenesin (TUSSIN DM) 10-100 MG/5ML liquid Take by mouth. Give 10 ml every 6 hours as needed for cough  . divalproex (DEPAKOTE SPRINKLE) 125 MG capsule Take 125 mg by mouth 2 (two) times daily.   . DULoxetine (CYMBALTA) 20 MG capsule Take 40 mg by mouth daily.   . furosemide (LASIX) 40 MG tablet Take 20 mg by mouth 2 (two) times daily.   . Glycerin, Laxative, (PEDIA-LAX) 2.8 g SUPP Place rectally daily as needed (for constipation).   . hydrALAZINE (APRESOLINE) 50 MG tablet Take 50 mg by mouth 2 (two) times daily. Hold for SBP < 110  . ipratropium-albuterol (DUONEB) 0.5-2.5 (3) MG/3ML SOLN Take 3 mLs every 6 (six) hours by nebulization. For shortness of breath and wheezing.   Marland Kitchen ipratropium-albuterol (DUONEB) 0.5-2.5 (3) MG/3ML SOLN Take 3 mLs by nebulization every 4 (four) hours as needed.  Marland Kitchen levothyroxine (SYNTHROID, LEVOTHROID) 50 MCG tablet Take 50 mcg by mouth every morning.   Marland Kitchen LORazepam (ATIVAN) 0.5 MG tablet Take 0.25 mg by mouth daily as needed for anxiety.   Marland Kitchen LORazepam (ATIVAN) 0.5 MG tablet Take 0.5 mg by mouth 2 (two) times daily.  Marland Kitchen losartan (COZAAR) 100 MG tablet Take 100 mg by mouth daily.  . Melatonin 3 MG TBDP Take 3 mg by mouth at bedtime.  . Menthol, Topical Analgesic, (BIOFREEZE) 4 % GEL Apply 1 application  topically 2 (two) times daily as needed.  . Morphine Sulfate (MORPHINE CONCENTRATE) 10 mg / 0.5 ml concentrated solution Take by mouth every 4 (four) hours as needed for severe pain (give 0.5 mL).   . Multiple Vitamin (MULTIVITAMIN WITH MINERALS) TABS tablet Take 1 tablet by mouth every morning.  . Nutritional Supplements (RESOURCE 2.0) LIQD Take 120 mLs by mouth 3 (three) times daily.   Marland Kitchen omeprazole (PRILOSEC) 20 MG capsule Take 20 mg by mouth daily.   . OXYGEN Inhale 2 L/min as needed into the lungs.  . polyethylene glycol (MIRALAX / GLYCOLAX) packet Take 17 g by mouth at bedtime.   . predniSONE (DELTASONE) 5 MG tablet Take 5 mg by mouth daily with breakfast.  . tiotropium (SPIRIVA) 18 MCG inhalation capsule Place 18 mcg into inhaler and inhale daily. Inhale by taking 2 separate inhalations   No facility-administered encounter medications on file as of 03/12/2017.     Review of Systems  Unable to perform ROS: Other (unable to obtain with her sleepiness and tiredness)    Vitals:   03/12/17 1436  BP: (!) 170/80  Pulse: 72  Resp: 18  Temp: 97.9 F (36.6 C)  TempSrc: Oral  SpO2: 95%  Weight: 123 lb (55.8 kg)  Height: 5\' 1"  (1.549 m)   Body mass index is 23.24 kg/m.   Wt Readings from Last 3 Encounters:  03/12/17 123 lb (55.8 kg)  03/08/17 123 lb (55.8 kg)  03/04/17 123 lb (55.8 kg)   Physical Exam  Constitutional: She appears well-developed and well-nourished. No distress.  frail  HENT:  Head: Normocephalic and atraumatic.  Mouth/Throat: Oropharynx is clear and moist. No oropharyngeal exudate.  Eyes: Conjunctivae are normal. Pupils are equal, round, and reactive to light.  Neck: Neck supple.  Cardiovascular: Normal rate, regular rhythm and intact distal pulses.  Pulmonary/Chest: Effort normal. No respiratory distress. She has no wheezes. She has no rales.  On o2 by nasal canula, decreased air entry to lung bases  Abdominal: Soft. Bowel sounds are normal. There is no  tenderness.  Musculoskeletal: She exhibits edema and deformity.  Palpable distal pulses. Able to move all 4 extremities. Under total care.   Lymphadenopathy:    She has no cervical adenopathy.  Neurological:  Pleasantly confused, chronic left sided weakness  Skin: Skin is warm and dry. She is not diaphoretic.    Labs reviewed: Basic Metabolic Panel: Recent Labs    01/11/17 01/14/17 01/21/17  NA 142 144 144  K 4.7 4.4 4.3  BUN 28* 32* 35*  CREATININE 0.9 0.9 1.1   Liver Function Tests: Recent Labs    05/07/16 11/26/16 01/11/17  AST 18 16 23   ALT 11 12 23   ALKPHOS 81 85 81   No results for input(s): LIPASE, AMYLASE in the last 8760 hours. No results for input(s): AMMONIA in the last 8760 hours. CBC: Recent Labs    11/06/16 1238 11/26/16 01/11/17  WBC 9.5 7.2 9.5  NEUTROABS 7.7*  --   --   HGB 18.2* 18.0* 17.1*  HCT 55.1* 53* 50*  MCV 102.3*  --   --   PLT 363 333 264   Cardiac Enzymes: No results for input(s): CKTOTAL, CKMB, CKMBINDEX, TROPONINI in the last 8760 hours. BNP: Invalid input(s): POCBNP Lab Results  Component Value Date   HGBA1C 5.8 10/01/2016   Lab Results  Component Value Date   TSH 1.91 11/05/2016   No results found for: VITAMINB12 No results found for: FOLATE No results found for: IRON, TIBC, FERRITIN  Lipid Panel: Recent Labs    10/01/16  CHOL 148  HDL 69  LDLCALC 64  TRIG 73   Lab Results  Component Value Date   HGBA1C 5.8 10/01/2016    Procedures since last visit: No results found.  Assessment/Plan  Chronic respiratory failure With copd and chf. Continue o2 by nasal canula, morphine sulfate 10 mg q4h prn dyspnea, daily prednisone with spiriva prn duoneb and monitor  Hypertension Continue hydralazine 50 mg bid with carvedilol 25 mg bid, losartan 100 mg daily and clonidine 0.1 mg daily  CHF Continue b blocker, ARB and hydralazine with furosemide 20 mg bid.   COPD Continue prednisone and  bronchodilators  Hypothyroidism Continue her levothyroxine and monitor  gerd Aspiration precautions, continue omeprazole 20 mg daily  Cognitive impairment Supportive care with her agitation and anxiety. Continue depakote 125 mg bid for now. Pt has history of CVA and there could be component of vascular dementia given ischemic changes noted on CT head from 2015.   Labs/tests ordered:  None  reviewed care plan with patient's charge nurse.      Blanchie Serve, MD Internal Medicine [facility] [address] [place], [place] [address] Cell Phone (Monday-Friday 8 am - 5 pm): [phone] On Call: [phone] and follow prompts after 5 pm and on weekends Office Phone: [phone] Office Fax: [phone]

## 2017-03-17 DIAGNOSIS — I509 Heart failure, unspecified: Secondary | ICD-10-CM | POA: Diagnosis not present

## 2017-03-17 DIAGNOSIS — R0902 Hypoxemia: Secondary | ICD-10-CM | POA: Diagnosis not present

## 2017-03-17 DIAGNOSIS — I679 Cerebrovascular disease, unspecified: Secondary | ICD-10-CM | POA: Diagnosis not present

## 2017-03-17 DIAGNOSIS — I69954 Hemiplegia and hemiparesis following unspecified cerebrovascular disease affecting left non-dominant side: Secondary | ICD-10-CM | POA: Diagnosis not present

## 2017-03-17 DIAGNOSIS — I10 Essential (primary) hypertension: Secondary | ICD-10-CM | POA: Diagnosis not present

## 2017-03-17 DIAGNOSIS — J449 Chronic obstructive pulmonary disease, unspecified: Secondary | ICD-10-CM | POA: Diagnosis not present

## 2017-03-18 DIAGNOSIS — I69954 Hemiplegia and hemiparesis following unspecified cerebrovascular disease affecting left non-dominant side: Secondary | ICD-10-CM | POA: Diagnosis not present

## 2017-03-18 DIAGNOSIS — I679 Cerebrovascular disease, unspecified: Secondary | ICD-10-CM | POA: Diagnosis not present

## 2017-03-18 DIAGNOSIS — R0902 Hypoxemia: Secondary | ICD-10-CM | POA: Diagnosis not present

## 2017-03-18 DIAGNOSIS — I509 Heart failure, unspecified: Secondary | ICD-10-CM | POA: Diagnosis not present

## 2017-03-18 DIAGNOSIS — J449 Chronic obstructive pulmonary disease, unspecified: Secondary | ICD-10-CM | POA: Diagnosis not present

## 2017-03-18 DIAGNOSIS — I10 Essential (primary) hypertension: Secondary | ICD-10-CM | POA: Diagnosis not present

## 2017-03-19 DIAGNOSIS — R0902 Hypoxemia: Secondary | ICD-10-CM | POA: Diagnosis not present

## 2017-03-19 DIAGNOSIS — J449 Chronic obstructive pulmonary disease, unspecified: Secondary | ICD-10-CM | POA: Diagnosis not present

## 2017-03-19 DIAGNOSIS — I509 Heart failure, unspecified: Secondary | ICD-10-CM | POA: Diagnosis not present

## 2017-03-19 DIAGNOSIS — I10 Essential (primary) hypertension: Secondary | ICD-10-CM | POA: Diagnosis not present

## 2017-03-19 DIAGNOSIS — I679 Cerebrovascular disease, unspecified: Secondary | ICD-10-CM | POA: Diagnosis not present

## 2017-03-19 DIAGNOSIS — I69954 Hemiplegia and hemiparesis following unspecified cerebrovascular disease affecting left non-dominant side: Secondary | ICD-10-CM | POA: Diagnosis not present

## 2017-03-23 DEATH — deceased
